# Patient Record
Sex: Female | Born: 1960 | Race: White | Hispanic: No | Marital: Married | Smoking: Former smoker
Health system: Southern US, Community
[De-identification: ages and names within clinical notes are randomized; demographics above are authoritative.]

## PROBLEM LIST (undated history)

## (undated) DIAGNOSIS — E119 Type 2 diabetes mellitus without complications: Secondary | ICD-10-CM

## (undated) DIAGNOSIS — K219 Gastro-esophageal reflux disease without esophagitis: Secondary | ICD-10-CM

## (undated) DIAGNOSIS — E7800 Pure hypercholesterolemia, unspecified: Secondary | ICD-10-CM

## (undated) DIAGNOSIS — D65 Disseminated intravascular coagulation [defibrination syndrome]: Secondary | ICD-10-CM

## (undated) DIAGNOSIS — J189 Pneumonia, unspecified organism: Secondary | ICD-10-CM

## (undated) DIAGNOSIS — M1990 Unspecified osteoarthritis, unspecified site: Secondary | ICD-10-CM

## (undated) DIAGNOSIS — Z9889 Other specified postprocedural states: Secondary | ICD-10-CM

## (undated) DIAGNOSIS — I10 Essential (primary) hypertension: Secondary | ICD-10-CM

## (undated) DIAGNOSIS — G43909 Migraine, unspecified, not intractable, without status migrainosus: Secondary | ICD-10-CM

## (undated) DIAGNOSIS — R112 Nausea with vomiting, unspecified: Secondary | ICD-10-CM

## (undated) DIAGNOSIS — B009 Herpesviral infection, unspecified: Secondary | ICD-10-CM

## (undated) HISTORY — DX: Migraine, unspecified, not intractable, without status migrainosus: G43.909

## (undated) HISTORY — PX: HIP ARTHROPLASTY: SHX981

## (undated) HISTORY — DX: Herpesviral infection, unspecified: B00.9

## (undated) HISTORY — PX: CERVICAL DISC SURGERY: SHX588

## (undated) HISTORY — PX: TOTAL ABDOMINAL HYSTERECTOMY: SHX209

## (undated) HISTORY — PX: ABDOMINAL HYSTERECTOMY: SHX81

---

## 1994-02-12 HISTORY — PX: DILATION AND CURETTAGE OF UTERUS: SHX78

## 1997-02-12 DIAGNOSIS — D65 Disseminated intravascular coagulation [defibrination syndrome]: Secondary | ICD-10-CM

## 1997-02-12 HISTORY — DX: Disseminated intravascular coagulation (defibrination syndrome): D65

## 1998-02-12 HISTORY — PX: HERNIA REPAIR: SHX51

## 2004-03-12 ENCOUNTER — Emergency Department: Payer: Self-pay | Admitting: Internal Medicine

## 2005-08-03 ENCOUNTER — Encounter: Admission: RE | Admit: 2005-08-03 | Discharge: 2005-08-03 | Payer: Self-pay | Admitting: General Surgery

## 2005-10-10 ENCOUNTER — Inpatient Hospital Stay (HOSPITAL_COMMUNITY): Admission: RE | Admit: 2005-10-10 | Discharge: 2005-10-14 | Payer: Self-pay | Admitting: General Surgery

## 2006-04-03 ENCOUNTER — Emergency Department: Payer: Self-pay | Admitting: Unknown Physician Specialty

## 2008-03-28 ENCOUNTER — Ambulatory Visit: Payer: Self-pay | Admitting: Internal Medicine

## 2008-04-03 ENCOUNTER — Ambulatory Visit: Payer: Self-pay | Admitting: Family Medicine

## 2008-06-28 ENCOUNTER — Ambulatory Visit (HOSPITAL_COMMUNITY): Admission: RE | Admit: 2008-06-28 | Discharge: 2008-06-29 | Payer: Self-pay | Admitting: Neurosurgery

## 2009-01-04 ENCOUNTER — Ambulatory Visit: Payer: Self-pay | Admitting: Internal Medicine

## 2009-11-11 ENCOUNTER — Ambulatory Visit: Payer: Self-pay | Admitting: Internal Medicine

## 2010-05-23 LAB — BASIC METABOLIC PANEL
BUN: 8 mg/dL (ref 6–23)
CO2: 29 mEq/L (ref 19–32)
Calcium: 9.6 mg/dL (ref 8.4–10.5)
Chloride: 101 mEq/L (ref 96–112)
Creatinine, Ser: 0.62 mg/dL (ref 0.4–1.2)
GFR calc Af Amer: >60 mL/min (ref >60)
GFR calc non Af Amer: >60 mL/min (ref >60)
Glucose, Bld: 106 mg/dL — ABNORMAL HIGH (ref 70–99)
Potassium: 3.8 mEq/L (ref 3.5–5.1)
Sodium: 138 mEq/L (ref 135–145)

## 2010-05-23 LAB — CBC
HCT: 41.8 % (ref 36.0–46.0)
Hemoglobin: 14.4 g/dL (ref 12.0–15.0)
MCHC: 34.5 g/dL (ref 30.0–36.0)
MCV: 88.2 fL (ref 78.0–100.0)
Platelets: 311 10*3/uL (ref 150–400)
RBC: 4.74 MIL/uL (ref 3.87–5.11)
RDW: 13.3 % (ref 11.5–15.5)
WBC: 8.5 10*3/uL (ref 4.0–10.5)

## 2010-06-14 ENCOUNTER — Other Ambulatory Visit: Payer: Self-pay | Admitting: Orthopedic Surgery

## 2010-06-14 DIAGNOSIS — M542 Cervicalgia: Secondary | ICD-10-CM

## 2010-06-14 DIAGNOSIS — M79605 Pain in left leg: Secondary | ICD-10-CM

## 2010-06-15 ENCOUNTER — Ambulatory Visit
Admission: RE | Admit: 2010-06-15 | Discharge: 2010-06-15 | Disposition: A | Discharge status: Home / Self Care | Payer: BC Managed Care – PPO | Source: Ambulatory Visit | Attending: Orthopedic Surgery | Admitting: Orthopedic Surgery

## 2010-06-15 DIAGNOSIS — M542 Cervicalgia: Secondary | ICD-10-CM

## 2010-06-15 DIAGNOSIS — M79605 Pain in left leg: Secondary | ICD-10-CM

## 2010-06-27 NOTE — Op Note (Signed)
NAME:  DACY, ENRICO NO.:  [id number]   MEDICAL RECORD NO.:  [id number]          PATIENT TYPE:  OIB   LOCATION:  3533                         FACILITY:  [facility]   PHYSICIAN:  Hewitt Shorts, M.D.DATE OF BIRTH:  08-Feb-1961   DATE OF PROCEDURE:  06/28/2008  DATE OF DISCHARGE:                               OPERATIVE REPORT   PREOPERATIVE DIAGNOSES:  1. Cervical spondylosis.  2. Cervical degenerative disk disease.  3. C6 cervical disk herniation.  4. Cervical radiculopathy.   POSTOPERATIVE DIAGNOSES:  1. Cervical spondylosis.  2. Cervical degenerative disk disease.  3. C6 cervical disk herniation.  4. Cervical radiculopathy.   PROCEDURE:  C4-5 and C5-6 anterior cervical decompression and  arthrodesis with allograft and tethered cervical plating.   SURGEON:  Hewitt Shorts, MD   ASSISTANT:  Stefani Dama, M.D.   ANESTHESIA:  General endotracheal.   INDICATIONS:  The patient is a 50 year old woman who presented with  right cervical radicular pain and headache.  She has advanced  spondylosis and degenerative disk disease at the C4-5 level, somewhat  left spondylosis and degenerative disk disease at the C5-6 level, but  with superimposed central through right C5-6 cervical disk herniation  with resultant radiculopathy.  The decision was made to proceed with  decompression and arthrodesis.   PROCEDURE:  The patient was brought to the operating room and placed  under general endotracheal anesthesia.  The patient was placed in 10  pounds of Holter traction.  Neck was prepped with Betadine soap and  solution and draped in sterile fashion.  A horizontal incision was made  in the left side of the neck.  The line of the incision was infiltrated  with local anesthetic with epinephrine.  Dissection was carried down  through the subcutaneous tissue.  Bipolar cautery and electrocautery was  used to maintain hemostasis.  Dissection was carried down to the  platysma and then through the avascular plane leaving the  sternocleidomastoid, carotid artery, and jugular vein laterally and the  trachea and esophagus medially.  The ventral aspect of the vertebral  column was identified and localizing x-rays were taken and the C4-5 and  C5-6 intervertebral disk space identified.  Diskectomy was begun at each  level with incision of the annulus and continued microcurettes and  pituitary rongeurs.  Anterior osteophytic over growth was removed using  the osteophyte removal tool and then the operating microscope was draped  and brought into the field to provide additional navigation,  illumination, and visualization.  The remainder of the decompression was  performed using microdissection and microsurgical technique.  Diskectomy  was continued using microcurettes and pituitary rongeurs.  The  cartilaginous endplates were removed using microcurettes along with the  X-Max drill and then posterior osteophytic overgrowth was removed using  the X-Max drill along with a 2-mm Kerrison punch with a thin footplate.  At each level, there was significant posterior osteophytic overgrowth.  This was removed at the C5-6 level.  As we opened the posterior  longitudinal ligament, there were fragments of disk that were removed,  and in the end, good decompression of the spinal  canal and thecal sac  was achieved at each level with removal of spondylotic overgrowth.  Dissection was then carried out laterally to the neuroforamen, and they  were like wise decompressed bilaterally.  Gelfoam with thrombin was used  to establish hemostasis.  Good hemostasis was established.  The wound  was irrigated extensively with bacitracin solution and then we measured  the heights of the intervertebral disk space and selected two 7-mm  allograft implants.  Each of them were hydrated in saline solutions and  positioned in the intervertebral disk space and countersunk.  We then  discontinued  the cervical traction and selected a 32-mm tethered  cervical plate.  It was positioned over the fusion construct and secured  to the vertebra with 4 x 13 mm variable angle screws placing a pair of  screws at C4 and another pair at C6 and straight angle screw at C5.  Each screw was drilled and screw was placed in alternating fashion.  Once all 5 screws were in place, a final tightening was performed.  An x-  ray was taken, which showed the grasp, plate and screws, all in good  position.  The alignment was good and then the wound was irrigated with  bacitracin solution and checked for hemostasis which was established and  confirmed, and then we proceeded with closure.  The platysma was closed  with interrupted, inverted 2-0 undyed Vicryl sutures.  The subcutaneous  and subcuticular layer were closed with interrupted inverted 3-0 undyed  Vicryl sutures.  The skin edges was closed with Dermabond.  The  procedure was tolerated well.  The estimated blood loss was 100 mL.  Sponge and needle count were correct.  Following surgery, the patient  was reversed from the anesthetic, extubated, and transferred to the  recovery room for further care where she was noted to be moving all 4  extremities to command.      Hewitt Shorts, M.D.  Electronically Signed     RWN/MEDQ  D:  06/28/2008  T:  06/29/2008  Job:  [id number]

## 2010-06-30 NOTE — Discharge Summary (Signed)
NAME:  [name], SOMAN NO.:  [id number]   MEDICAL RECORD NO.:  [id number]          PATIENT TYPE:  INP   LOCATION:  1407                         [facility]ACILITY[facility]   PHYSICIAN:  Ollen Gross. Vernell Morgans, M.D. DATE OF BIRTH:  Aug 13, 1960   DATE OF ADMISSION:  10/10/2005  DATE OF DISCHARGE:  10/14/2005                                 DISCHARGE SUMMARY   Ms. Tracy Bryan is a 50 year old white female with a ventral hernia from  previous abdominal surgeries.  She was brought to the operating room on  August 29 and underwent a ventral hernia repair with mesh and extensive  lysis of adhesions.  Postoperatively, she did very well.  A drain was left  in her subcutaneous tissue that was drainage of serosanguineous fluid.  She  did have a history of hypertension, and this was controlled with IV  Lopressor until her bowel function returned, and she was able to start back  on her regular oral antihypertensives.  Her bowel function gradually  improved over the days that she was here.  She did develop some right foot  pain while here, and a duplex ruled out a DVT, and by September 2 she was  tolerating a diet, ambulating without difficulty.  Her pain was under  control with p.o. pain meds, and she was ready for discharge home.  She was  given a prescription for Vicodin for pain and she wished to resume her home  meds.   ACTIVITIES:  No heavy lifting.  Diet is as tolerated.  She will follow with  Dr. Carolynne Edouard in about a week to hopefully get her drain out.   Condition is stable.   FINAL DIAGNOSIS:  Ventral hernia.  She is discharged home.      Ollen Gross. Vernell Morgans, M.D.  Electronically Signed     PST/MEDQ  D:  10/22/2005  T:  10/22/2005  Job:  [id number]

## 2010-06-30 NOTE — Op Note (Signed)
NAME:  Tracy Bryan, Tracy Bryan NO.:  [id number]   MEDICAL RECORD NO.:  [id number]          PATIENT TYPE:  INP   LOCATION:  1407                         FACILITY:  [facility]   PHYSICIAN:  Ollen Gross. Vernell Morgans, M.D. DATE OF BIRTH:  09-Apr-1960   DATE OF PROCEDURE:  10/10/2005  DATE OF DISCHARGE:                                 OPERATIVE REPORT   PREOPERATIVE DIAGNOSIS:  Ventral hernia.   POSTOPERATIVE DIAGNOSIS:  Ventral hernia.   PROCEDURES:  Exploratory laparotomy, extensive lysis of adhesions taking  greater than an hour and ventral hernia repair with mesh.   SURGEON:  Dr. Carolynne Edouard.   ASSISTANT:  Dr. Maryagnes Amos.   ANESTHESIA:  General endotracheal.   DESCRIPTION OF PROCEDURE:  After informed consent was obtained, the patient  was brought to the operating room and placed in supine position on the  operating room table.  After adequate induction of general anesthesia, the  patient's abdomen was prepped with Betadine and draped in the usual sterile  manner.  A midline incision was made with a 10 blade knife through her old  incision.  This incision was carried down through the skin and subcutaneous  tissue sharply with the electrocautery until her fascia over the abdominal  wall was encountered.  The patient had a lot of scar tissue from  approximately 12 previous abdominal surgeries. Care was taken going through  each layer of the abdominal wall so as not to injure anything. Once the  fascia had been opened, blunt hemostat dissection was used to separate any  adhesions from the fascia and the fascia was slowly able to be opened with  the electrocautery after doing this. The fascia was then grasped with Kocher  clamps on each side and elevated toward the ceiling. The adhesions of  omentum and bowel to the anterior abdominal wall were then taken down  circumferentially around the inside of the abdominal wall.  This was done  sharply with the Metzenbaum scissors and with the  electrocautery.  This was  very tedious dissection as the patient had almost complete adhesions of her  abdominal cavity and no free space.  This took greater than an hour to try  to finally free up enough of these adhesions from the abdominal wall that we  could fix the hernia. Once we had freed up enough space, her fascia was  identified.  A subcutaneous plane on top of the fascia was then opened  sharply with the electrocautery and dissected free circumferentially around  the wound. Next a piece of proceed mesh was chosen and the mesh was oriented  with the blue side towards the ceiling. The mesh was then sewed through the  abdominal wall circumferentially with interrupted #1 Novofil stitches.  Each  stitch was placed close enough that no abdominal contents could herniate up  under the mesh and these were brought through the abdominal wall laterally  so as to completely cover the hernia defect and the abdominal wall. Once  this was accomplished, the mesh was in good position. The abdominal cavity  was irrigated with copious amounts of saline. The fascia of the  anterior  abdominal wall was then closed with #1 running Novofil stitch as well as  several interrupted figure-of-eight #1 Novofil stitches.  The subcutaneous  tissue was then again irrigated with copious amounts of saline and Betadine.  A small stab incision was made in the right lower quadrant and a tonsil  clamp was placed through this opening into the wound.  A 19-French round  Harrison Mons drain was then brought through the opening and the drain was placed in  the subcutaneous bed. The drain was anchored to the skin with a 3-0 nylon  stitch. The  subcutaneous tissue was closed with running 2-0 Vicryl stitch and the skin  was closed with staples.  Sterile dressings were applied.  The patient  tolerated the procedure well. At the end of the case, all needle, sponge and  instrument counts were correct.  The patient was then awakened and  taken to  recovery in stable condition.      Ollen Gross. Vernell Morgans, M.D.  Electronically Signed     PST/MEDQ  D:  10/11/2005  T:  10/12/2005  Job:  [id number]

## 2011-01-08 ENCOUNTER — Ambulatory Visit: Payer: Self-pay | Admitting: Internal Medicine

## 2011-04-24 ENCOUNTER — Ambulatory Visit: Payer: Self-pay | Admitting: Internal Medicine

## 2011-04-30 ENCOUNTER — Ambulatory Visit: Payer: Self-pay

## 2012-02-03 ENCOUNTER — Emergency Department: Payer: Self-pay | Admitting: Emergency Medicine

## 2012-02-03 LAB — BASIC METABOLIC PANEL
BUN: 25 mg/dL — ABNORMAL HIGH (ref 7–18)
Co2: 25 mmol/L (ref 21–32)
Creatinine: 0.89 mg/dL (ref 0.60–1.30)
EGFR (African American): >60
EGFR (Non-African Amer.): >60
Glucose: 142 mg/dL — ABNORMAL HIGH (ref 65–99)
Potassium: 3.8 mmol/L (ref 3.5–5.1)
Sodium: 136 mmol/L (ref 136–145)

## 2012-02-03 LAB — URINALYSIS, COMPLETE
Bacteria: NONE SEEN
Bilirubin,UR: NEGATIVE
Blood: NEGATIVE
Ketone: NEGATIVE
Nitrite: NEGATIVE
Ph: 7 (ref 4.5–8.0)
Protein: NEGATIVE
Specific Gravity: 1.019 (ref 1.003–1.030)

## 2012-02-03 LAB — CBC
HCT: 42.6 % (ref 35.0–47.0)
MCV: 88 fL (ref 80–100)
Platelet: 259 10*3/uL (ref 150–440)
RDW: 12.9 % (ref 11.5–14.5)
WBC: 13.3 10*3/uL — ABNORMAL HIGH (ref 3.6–11.0)

## 2012-02-03 LAB — TROPONIN I: Troponin-I: <0.02 ng/mL

## 2012-02-27 ENCOUNTER — Ambulatory Visit: Payer: Self-pay | Admitting: Family Medicine

## 2013-01-16 ENCOUNTER — Emergency Department: Payer: Self-pay | Admitting: Emergency Medicine

## 2013-01-16 LAB — CBC
HGB: 12.6 g/dL (ref 12.0–16.0)
MCHC: 34 g/dL (ref 32.0–36.0)
Platelet: 320 10*3/uL (ref 150–440)
RBC: 4.35 10*6/uL (ref 3.80–5.20)
RDW: 13.2 % (ref 11.5–14.5)
WBC: 9.5 10*3/uL (ref 3.6–11.0)

## 2013-01-16 LAB — BASIC METABOLIC PANEL
Anion Gap: 9 (ref 7–16)
BUN: 21 mg/dL — ABNORMAL HIGH (ref 7–18)
Chloride: 102 mmol/L (ref 98–107)
EGFR (Non-African Amer.): >60
Osmolality: 277 (ref 275–301)
Potassium: 3.8 mmol/L (ref 3.5–5.1)
Sodium: 136 mmol/L (ref 136–145)

## 2013-01-16 LAB — CK TOTAL AND CKMB (NOT AT ARMC): CK-MB: 1.9 ng/mL (ref 0.5–3.6)

## 2013-06-12 ENCOUNTER — Ambulatory Visit: Payer: Self-pay | Admitting: Family Medicine

## 2013-06-12 LAB — URINALYSIS, COMPLETE
BILIRUBIN, UR: NEGATIVE
Blood: NEGATIVE
Glucose,UR: NEGATIVE mg/dL (ref 0–75)
KETONE: NEGATIVE
NITRITE: NEGATIVE
Ph: 7 (ref 4.5–8.0)
SPECIFIC GRAVITY: 1.01 (ref 1.003–1.030)

## 2013-06-12 LAB — BASIC METABOLIC PANEL
ANION GAP: 13 (ref 7–16)
BUN: 28 mg/dL — ABNORMAL HIGH (ref 7–18)
Calcium, Total: 9.8 mg/dL (ref 8.5–10.1)
Chloride: 96 mmol/L — ABNORMAL LOW (ref 98–107)
Co2: 27 mmol/L (ref 21–32)
Creatinine: 1.24 mg/dL (ref 0.60–1.30)
EGFR (Non-African Amer.): 49 — ABNORMAL LOW
GFR CALC AF AMER: 57 — AB
Glucose: 128 mg/dL — ABNORMAL HIGH (ref 65–99)
Osmolality: 279 (ref 275–301)
Potassium: 3.5 mmol/L (ref 3.5–5.1)
Sodium: 136 mmol/L (ref 136–145)

## 2013-06-12 LAB — CBC WITH DIFFERENTIAL/PLATELET
BASOS ABS: 0.2 10*3/uL — AB (ref 0.0–0.1)
Basophil %: 2.1 %
Eosinophil #: 0.1 10*3/uL (ref 0.0–0.7)
Eosinophil %: 1.3 %
HCT: 42.6 % (ref 35.0–47.0)
HGB: 14.1 g/dL (ref 12.0–16.0)
LYMPHS ABS: 2.4 10*3/uL (ref 1.0–3.6)
Lymphocyte %: 25.6 %
MCH: 28.9 pg (ref 26.0–34.0)
MCHC: 33.1 g/dL (ref 32.0–36.0)
MCV: 87 fL (ref 80–100)
Monocyte #: 0.5 x10 3/mm (ref 0.2–0.9)
Monocyte %: 5.5 %
NEUTROS PCT: 65.5 %
Neutrophil #: 6 10*3/uL (ref 1.4–6.5)
PLATELETS: 369 10*3/uL (ref 150–440)
RBC: 4.88 10*6/uL (ref 3.80–5.20)
RDW: 13.7 % (ref 11.5–14.5)
WBC: 9.2 10*3/uL (ref 3.6–11.0)

## 2013-06-23 ENCOUNTER — Ambulatory Visit: Payer: Self-pay | Admitting: Family Medicine

## 2013-06-23 LAB — URINALYSIS, COMPLETE
BILIRUBIN, UR: NEGATIVE
BLOOD: NEGATIVE
GLUCOSE, UR: NEGATIVE mg/dL (ref 0–75)
Ketone: NEGATIVE
NITRITE: NEGATIVE
Ph: 6 (ref 4.5–8.0)
Protein: NEGATIVE
Specific Gravity: 1.025 (ref 1.003–1.030)

## 2013-06-23 LAB — BASIC METABOLIC PANEL
Anion Gap: 11 (ref 7–16)
BUN: 19 mg/dL — ABNORMAL HIGH (ref 7–18)
Calcium, Total: 9.6 mg/dL (ref 8.5–10.1)
Chloride: 99 mmol/L (ref 98–107)
Co2: 28 mmol/L (ref 21–32)
Creatinine: 0.95 mg/dL (ref 0.60–1.30)
EGFR (African American): >60
Glucose: 97 mg/dL (ref 65–99)
OSMOLALITY: 278 (ref 275–301)
POTASSIUM: 3.8 mmol/L (ref 3.5–5.1)
SODIUM: 138 mmol/L (ref 136–145)

## 2013-06-23 LAB — GC/CHLAMYDIA PROBE AMP

## 2013-06-23 LAB — WET PREP, GENITAL

## 2013-06-24 LAB — URINE CULTURE

## 2013-07-08 ENCOUNTER — Observation Stay: Payer: Self-pay | Admitting: Internal Medicine

## 2013-07-08 LAB — CBC WITH DIFFERENTIAL/PLATELET
BASOS ABS: 0 10*3/uL (ref 0.0–0.1)
Basophil %: 0.6 %
EOS ABS: 0 10*3/uL (ref 0.0–0.7)
EOS PCT: 0.6 %
HCT: 35.1 % (ref 35.0–47.0)
HGB: 11.9 g/dL — ABNORMAL LOW (ref 12.0–16.0)
LYMPHS ABS: 2 10*3/uL (ref 1.0–3.6)
Lymphocyte %: 43.6 %
MCH: 30.4 pg (ref 26.0–34.0)
MCHC: 34 g/dL (ref 32.0–36.0)
MCV: 90 fL (ref 80–100)
Monocyte #: 0.3 x10 3/mm (ref 0.2–0.9)
Monocyte %: 6.8 %
NEUTROS ABS: 2.2 10*3/uL (ref 1.4–6.5)
Neutrophil %: 48.4 %
Platelet: 133 10*3/uL — ABNORMAL LOW (ref 150–440)
RBC: 3.92 10*6/uL (ref 3.80–5.20)
RDW: 12.5 % (ref 11.5–14.5)
WBC: 4.6 10*3/uL (ref 3.6–11.0)

## 2013-07-08 LAB — COMPREHENSIVE METABOLIC PANEL
ALBUMIN: 3.5 g/dL (ref 3.4–5.0)
ALK PHOS: 65 U/L
ALT: 16 U/L (ref 12–78)
ANION GAP: 8 (ref 7–16)
AST: 18 U/L (ref 15–37)
BILIRUBIN TOTAL: 0.2 mg/dL (ref 0.2–1.0)
BUN: 10 mg/dL (ref 7–18)
CREATININE: 0.92 mg/dL (ref 0.60–1.30)
Calcium, Total: 8.7 mg/dL (ref 8.5–10.1)
Chloride: 108 mmol/L — ABNORMAL HIGH (ref 98–107)
Co2: 23 mmol/L (ref 21–32)
EGFR (African American): >60
GLUCOSE: 99 mg/dL (ref 65–99)
Osmolality: 277 (ref 275–301)
Potassium: 3.5 mmol/L (ref 3.5–5.1)
Sodium: 139 mmol/L (ref 136–145)
TOTAL PROTEIN: 6.7 g/dL (ref 6.4–8.2)

## 2013-07-22 HISTORY — PX: COLONOSCOPY: SHX174

## 2013-07-22 LAB — HM COLONOSCOPY

## 2013-09-28 ENCOUNTER — Ambulatory Visit: Payer: Self-pay | Admitting: Physician Assistant

## 2013-09-28 LAB — CBC WITH DIFFERENTIAL/PLATELET
BASOS ABS: 0.1 10*3/uL (ref 0.0–0.1)
Basophil %: 1.3 %
Eosinophil #: 0.4 10*3/uL (ref 0.0–0.7)
Eosinophil %: 3.9 %
HCT: 39.4 % (ref 35.0–47.0)
HGB: 13.1 g/dL (ref 12.0–16.0)
LYMPHS ABS: 2.2 10*3/uL (ref 1.0–3.6)
LYMPHS PCT: 21.2 %
MCH: 28.4 pg (ref 26.0–34.0)
MCHC: 33.4 g/dL (ref 32.0–36.0)
MCV: 85 fL (ref 80–100)
Monocyte #: 0.8 x10 3/mm (ref 0.2–0.9)
Monocyte %: 7.3 %
NEUTROS ABS: 6.9 10*3/uL — AB (ref 1.4–6.5)
NEUTROS PCT: 66.3 %
Platelet: 290 10*3/uL (ref 150–440)
RBC: 4.62 10*6/uL (ref 3.80–5.20)
RDW: 12.9 % (ref 11.5–14.5)
WBC: 10.5 10*3/uL (ref 3.6–11.0)

## 2013-09-28 LAB — URINALYSIS, COMPLETE
BLOOD: NEGATIVE
Bilirubin,UR: NEGATIVE
GLUCOSE, UR: NEGATIVE
Ketone: NEGATIVE
Nitrite: NEGATIVE
Ph: 5 (ref 5.0–8.0)
Protein: NEGATIVE
Specific Gravity: 1.025 (ref 1.000–1.030)

## 2013-09-28 LAB — RAPID STREP-A WITH REFLX: Micro Text Report: NEGATIVE

## 2013-10-01 LAB — BETA STREP CULTURE(ARMC)

## 2014-04-15 ENCOUNTER — Other Ambulatory Visit: Payer: Self-pay | Admitting: Orthopaedic Surgery

## 2014-05-05 NOTE — Pre-Procedure Instructions (Signed)
Tracy FloodSharon S Bryan  05/05/2014   Your procedure is scheduled on: Tuesday, May 18, 2014 at 12:35 PM  [facility] at 10:30 AM.  Call this number if you have problems the morning of surgery: [phone]   Remember:   Do not eat food or drink liquids after midnight Monday, May 17, 2014   Take these medicines the morning of surgery with A SIP OF WATER: Duloxetine (Cymbalta), Metoprolol (Lopressor)   Do NOT take any diabetic medications the morning of your surgery   Please stop taking any herbal medications, Ibuprofen, Advil, Motrin, Alleve, etc Tuesday March 29th.   Do not wear jewelry, make-up or nail polish.  Do not wear lotions, powders, or perfumes. You may not wear deodorant.  Do not shave 48 hours prior to surgery.   Do not bring valuables to the hospital.  [facility] not [facility] for any belongings or valuables.               Contacts, dentures or bridgework may not be worn into surgery.  Leave suitcase in the car. After surgery it may be brought to your room.  For patients admitted to the hospital, discharge time is determined by your treatment team.               Patients discharged the day of surgery will not be allowed to drive home.  Name and phone number of your driver:   Special Instructions:  Shower using CHG soap the night before and the morning of your surgery   Please read over the following fact sheets that you were given: Pain Booklet, Coughing and Deep Breathing, Blood Transfusion Information, Total Joint Packet, MRSA Information and Surgical Site Infection Prevention

## 2014-05-06 ENCOUNTER — Encounter (HOSPITAL_COMMUNITY): Payer: Self-pay

## 2014-05-06 ENCOUNTER — Encounter (HOSPITAL_COMMUNITY)
Admission: RE | Admit: 2014-05-06 | Discharge: 2014-05-06 | Disposition: A | Discharge status: Home / Self Care | Payer: Medicare Other | Source: Ambulatory Visit | Attending: Orthopaedic Surgery | Admitting: Orthopaedic Surgery

## 2014-05-06 DIAGNOSIS — Z01810 Encounter for preprocedural cardiovascular examination: Secondary | ICD-10-CM | POA: Diagnosis not present

## 2014-05-06 DIAGNOSIS — I10 Essential (primary) hypertension: Secondary | ICD-10-CM | POA: Diagnosis not present

## 2014-05-06 DIAGNOSIS — Z01818 Encounter for other preprocedural examination: Secondary | ICD-10-CM | POA: Diagnosis not present

## 2014-05-06 DIAGNOSIS — Z01812 Encounter for preprocedural laboratory examination: Secondary | ICD-10-CM | POA: Insufficient documentation

## 2014-05-06 DIAGNOSIS — K219 Gastro-esophageal reflux disease without esophagitis: Secondary | ICD-10-CM | POA: Diagnosis not present

## 2014-05-06 DIAGNOSIS — E119 Type 2 diabetes mellitus without complications: Secondary | ICD-10-CM | POA: Diagnosis not present

## 2014-05-06 HISTORY — DX: Gastro-esophageal reflux disease without esophagitis: K21.9

## 2014-05-06 HISTORY — DX: Disseminated intravascular coagulation (defibrination syndrome): D65

## 2014-05-06 HISTORY — DX: Essential (primary) hypertension: I10

## 2014-05-06 HISTORY — DX: Unspecified osteoarthritis, unspecified site: M19.90

## 2014-05-06 HISTORY — DX: Type 2 diabetes mellitus without complications: E11.9

## 2014-05-06 HISTORY — DX: Pneumonia, unspecified organism: J18.9

## 2014-05-06 HISTORY — DX: Other specified postprocedural states: Z98.890

## 2014-05-06 HISTORY — DX: Other specified postprocedural states: R11.2

## 2014-05-06 HISTORY — DX: Pure hypercholesterolemia, unspecified: E78.00

## 2014-05-06 LAB — BASIC METABOLIC PANEL
Anion gap: 12 (ref 5–15)
BUN: 23 mg/dL (ref 6–23)
CO2: 27 mmol/L (ref 19–32)
CREATININE: 1.08 mg/dL (ref 0.50–1.10)
Calcium: 10 mg/dL (ref 8.4–10.5)
Chloride: 98 mmol/L (ref 96–112)
GFR, EST AFRICAN AMERICAN: 66 mL/min — AB (ref >90)
GFR, EST NON AFRICAN AMERICAN: 57 mL/min — AB (ref >90)
Glucose, Bld: 149 mg/dL — ABNORMAL HIGH (ref 70–99)
POTASSIUM: 3.3 mmol/L — AB (ref 3.5–5.1)
Sodium: 137 mmol/L (ref 135–145)

## 2014-05-06 LAB — CBC WITH DIFFERENTIAL/PLATELET
Basophils Absolute: 0.1 10*3/uL (ref 0.0–0.1)
Basophils Relative: 1 % (ref 0–1)
EOS ABS: 0.5 10*3/uL (ref 0.0–0.7)
Eosinophils Relative: 5 % (ref 0–5)
HCT: 42.9 % (ref 36.0–46.0)
HEMOGLOBIN: 14.3 g/dL (ref 12.0–15.0)
Lymphocytes Relative: 30 % (ref 12–46)
Lymphs Abs: 2.9 10*3/uL (ref 0.7–4.0)
MCH: 28.5 pg (ref 26.0–34.0)
MCHC: 33.3 g/dL (ref 30.0–36.0)
MCV: 85.6 fL (ref 78.0–100.0)
MONO ABS: 0.5 10*3/uL (ref 0.1–1.0)
MONOS PCT: 5 % (ref 3–12)
NEUTROS PCT: 59 % (ref 43–77)
Neutro Abs: 5.5 10*3/uL (ref 1.7–7.7)
Platelets: 343 10*3/uL (ref 150–400)
RBC: 5.01 MIL/uL (ref 3.87–5.11)
RDW: 13.3 % (ref 11.5–15.5)
WBC: 9.5 10*3/uL (ref 4.0–10.5)

## 2014-05-06 LAB — URINE MICROSCOPIC-ADD ON

## 2014-05-06 LAB — URINALYSIS, ROUTINE W REFLEX MICROSCOPIC
BILIRUBIN URINE: NEGATIVE
GLUCOSE, UA: NEGATIVE mg/dL
Hgb urine dipstick: NEGATIVE
KETONES UR: NEGATIVE mg/dL
Nitrite: NEGATIVE
PH: 7.5 (ref 5.0–8.0)
Protein, ur: NEGATIVE mg/dL
Specific Gravity, Urine: 1.022 (ref 1.005–1.030)
Urobilinogen, UA: 0.2 mg/dL (ref 0.0–1.0)

## 2014-05-06 LAB — TYPE AND SCREEN
ABO/RH(D): A NEG
Antibody Screen: NEGATIVE

## 2014-05-06 LAB — SURGICAL PCR SCREEN
MRSA, PCR: NEGATIVE
Staphylococcus aureus: NEGATIVE

## 2014-05-06 LAB — PROTIME-INR
INR: 1.02 (ref 0.00–1.49)
Prothrombin Time: 13.5 seconds (ref 11.6–15.2)

## 2014-05-06 LAB — ABO/RH: ABO/RH(D): A NEG

## 2014-05-06 LAB — APTT: APTT: 28 s (ref 24–37)

## 2014-05-06 NOTE — Progress Notes (Signed)
PCP is Vonita MossMark Crissman and Cardiologist is Arnoldo HookerBruce Kowalski. Patient informed Nurse that she had a stress test > 5 years ago but denied having a cardiac cath, or sleep study. Patient also informed Nurse that she has not seen Dr. Gwen PoundsKowalski since having stress test.

## 2014-05-07 NOTE — Progress Notes (Signed)
Anesthesia Chart Review:  Pt is 54 year old female scheduled for L total hip arthroplasty on 05/18/2014 with Dr. Jerl Santosalldorf.   PMH includes: HTN, DM, hypercholesterolemia, GERD, DIC (1999). Former smoker. BMI 25.   Preoperative labs reviewed.    Chest x-ray reviewed. No active cardiopulmonary disease.   EKG: NSR. Low voltage QRS. Cannot rule out Anterior infarct, age undetermined. Nonspecific ST abnormality. No significant change since last tracing per interpretation by Dr. Gala RomneyBensimhon.   If no changes, I anticipate pt can proceed with surgery as scheduled.   Rica Mastngela Adaisha Campise, FNP-BC [facility]Anesthesiology Phone: [phone] 05/07/2014 1:45 PM

## 2014-05-12 NOTE — H&P (Signed)
TOTAL HIP ADMISSION H&P  Patient is admitted for left total hip arthroplasty.  Subjective:  Chief Complaint: left hip pain  HPI: Tracy Bryan, 54 y.o. female, has a history of pain and functional disability in the left hip(s) due to arthritis and patient has failed non-surgical conservative treatments for greater than 12 weeks to include NSAID's and/or analgesics, corticosteriod injections and supervised PT with diminished ADL's post treatment.  Onset of symptoms was gradual starting 6 years ago with gradually worsening course since that time.The patient noted no past surgery on the left hip(s).  Patient currently rates pain in the left hip at 9 out of 10 with activity. Patient has night pain, worsening of pain with activity and weight bearing, trendelenberg gait, pain that interfers with activities of daily living and pain with passive range of motion. 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 This condition presents safety issues increasing the risk of falls. This patient has had no.  There is no current active infection.  There are no active problems to display for this patient.  Past Medical History  Diagnosis Date  . PONV (postoperative nausea and vomiting)   . Hypertension   . Pneumonia   . Diabetes mellitus without complication     Type 2  . GERD (gastroesophageal reflux disease)   . Arthritis   . Hypercholesteremia   . Disseminated intravascular coagulation 1999    After child birth    Past Surgical History  Procedure Laterality Date  . Hip arthroplasty Right   . Hernia repair      Umblical  . Cervical disc surgery      No prescriptions prior to admission   No Known Allergies  History  Substance Use Topics  . Smoking status: Former Games developermoker  . Smokeless tobacco: Not on file  . Alcohol Use: No    No family history on file.   Review of Systems  Constitutional: Negative.   HENT: Negative.   Eyes: Negative.   Respiratory: Negative.    Cardiovascular: Negative.   Gastrointestinal: Negative.   Genitourinary: Negative.   Skin: Negative.   Neurological: Negative.   Endo/Heme/Allergies: Negative.   Psychiatric/Behavioral: Negative.     Objective:  Physical Exam  HENT:  Head: Normocephalic.  Eyes: Pupils are equal, round, and reactive to light.  Neck: Normal range of motion.  Cardiovascular: Normal rate.   Respiratory: Effort normal.  GI: Soft.  Musculoskeletal:  Left hip exam pain with flexion and very reduced and painful with rotation.  Leg lengths appear to be equal.  Sensory motor function normal  Neurological: She is alert.  Skin: Skin is warm.  Psychiatric: She has a normal mood and affect.    Vital signs in last 24 hours:    Labs:   There is no height or weight on file to calculate BMI.   Imaging Review Plain radiographs demonstrate severe degenerative joint disease of the left hip(s). The bone quality appears to be good for age and reported activity level.  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 The treatment options including medical management, injection therapy, arthroscopy and arthroplasty were discussed at length. The risks and benefits of total hip arthroplasty were presented and reviewed. The risks due to aseptic loosening, infection, stiffness, dislocation/subluxation,  thromboembolic  complications and other imponderables were discussed.  The patient acknowledged the explanation, agreed to proceed with the plan and consent was signed. Patient is being admitted for inpatient treatment for surgery, pain control, PT, OT, prophylactic antibiotics, VTE prophylaxis, progressive ambulation and ADL's and discharge  planning.The patient is planning to be discharged home with home health services

## 2014-05-17 MED ORDER — LACTATED RINGERS IV SOLN
INTRAVENOUS | Status: DC
Start: 2014-05-17 — End: 2014-05-18

## 2014-05-17 MED ORDER — CHLORHEXIDINE GLUCONATE 4 % EX LIQD
60.0000 mL | Freq: Once | CUTANEOUS | Status: DC
Start: 2014-05-17 — End: 2014-05-18
  Filled 2014-05-17: qty 60

## 2014-05-17 MED ORDER — CEFAZOLIN SODIUM-DEXTROSE 2-3 GM-% IV SOLR
2.0000 g | INTRAVENOUS | Status: AC
  Administered 2014-05-18: 2 g via INTRAVENOUS
  Filled 2014-05-17: qty 50

## 2014-05-18 ENCOUNTER — Inpatient Hospital Stay (HOSPITAL_COMMUNITY): Payer: Medicare Other | Admitting: Anesthesiology

## 2014-05-18 ENCOUNTER — Encounter (HOSPITAL_COMMUNITY): Payer: Self-pay | Admitting: *Deleted

## 2014-05-18 ENCOUNTER — Inpatient Hospital Stay (HOSPITAL_COMMUNITY)
Admission: RE | Admit: 2014-05-18 | Discharge: 2014-05-20 | DRG: 470 | Disposition: A | Discharge status: Home / Self Care | Payer: Medicare Other | Source: Ambulatory Visit | Attending: Orthopaedic Surgery | Admitting: Orthopaedic Surgery

## 2014-05-18 ENCOUNTER — Inpatient Hospital Stay (HOSPITAL_COMMUNITY): Payer: Medicare Other

## 2014-05-18 ENCOUNTER — Inpatient Hospital Stay (HOSPITAL_COMMUNITY): Payer: Medicare Other | Admitting: Emergency Medicine

## 2014-05-18 ENCOUNTER — Encounter (HOSPITAL_COMMUNITY)
Admission: RE | Disposition: A | Discharge status: Home / Self Care | Payer: Self-pay | Source: Ambulatory Visit | Attending: Orthopaedic Surgery

## 2014-05-18 DIAGNOSIS — Z87891 Personal history of nicotine dependence: Secondary | ICD-10-CM

## 2014-05-18 DIAGNOSIS — Z96641 Presence of right artificial hip joint: Secondary | ICD-10-CM | POA: Diagnosis present

## 2014-05-18 DIAGNOSIS — E118 Type 2 diabetes mellitus with unspecified complications: Secondary | ICD-10-CM

## 2014-05-18 DIAGNOSIS — E119 Type 2 diabetes mellitus without complications: Secondary | ICD-10-CM | POA: Diagnosis present

## 2014-05-18 DIAGNOSIS — E780 Pure hypercholesterolemia: Secondary | ICD-10-CM | POA: Diagnosis present

## 2014-05-18 DIAGNOSIS — Z862 Personal history of diseases of the blood and blood-forming organs and certain disorders involving the immune mechanism: Secondary | ICD-10-CM | POA: Diagnosis not present

## 2014-05-18 DIAGNOSIS — Z8701 Personal history of pneumonia (recurrent): Secondary | ICD-10-CM

## 2014-05-18 DIAGNOSIS — E1129 Type 2 diabetes mellitus with other diabetic kidney complication: Secondary | ICD-10-CM

## 2014-05-18 DIAGNOSIS — M1612 Unilateral primary osteoarthritis, left hip: Principal | ICD-10-CM | POA: Diagnosis present

## 2014-05-18 DIAGNOSIS — K219 Gastro-esophageal reflux disease without esophagitis: Secondary | ICD-10-CM | POA: Diagnosis present

## 2014-05-18 DIAGNOSIS — I10 Essential (primary) hypertension: Secondary | ICD-10-CM | POA: Diagnosis present

## 2014-05-18 DIAGNOSIS — M25552 Pain in left hip: Secondary | ICD-10-CM | POA: Diagnosis present

## 2014-05-18 DIAGNOSIS — Z419 Encounter for procedure for purposes other than remedying health state, unspecified: Secondary | ICD-10-CM

## 2014-05-18 DIAGNOSIS — E1165 Type 2 diabetes mellitus with hyperglycemia: Secondary | ICD-10-CM

## 2014-05-18 HISTORY — PX: TOTAL HIP ARTHROPLASTY: SHX124

## 2014-05-18 LAB — GLUCOSE, CAPILLARY
GLUCOSE-CAPILLARY: 131 mg/dL — AB (ref 70–99)
GLUCOSE-CAPILLARY: 99 mg/dL (ref 70–99)
GLUCOSE-CAPILLARY: 166 mg/dL — AB (ref 70–99)
Glucose-Capillary: 88 mg/dL (ref 70–99)

## 2014-05-18 SURGERY — ARTHROPLASTY, HIP, TOTAL, ANTERIOR APPROACH
Anesthesia: Spinal | Site: Hip | Laterality: Left

## 2014-05-18 MED ORDER — CEFAZOLIN SODIUM-DEXTROSE 2-3 GM-% IV SOLR
2.0000 g | Freq: Four times a day (QID) | INTRAVENOUS | Status: AC
  Administered 2014-05-18 – 2014-05-19 (×2): 2 g via INTRAVENOUS
  Filled 2014-05-18 (×2): qty 50

## 2014-05-18 MED ORDER — FENTANYL CITRATE 0.05 MG/ML IJ SOLN
INTRAMUSCULAR | Status: DC | PRN
  Administered 2014-05-18 (×2): 50 ug via INTRAVENOUS

## 2014-05-18 MED ORDER — ONDANSETRON HCL 4 MG/2ML IJ SOLN: 4.0000 mg | Freq: Four times a day (QID) | INTRAMUSCULAR | Status: DC | PRN

## 2014-05-18 MED ORDER — LACTATED RINGERS IV SOLN
INTRAVENOUS | Status: DC
  Administered 2014-05-18: 22:00:00 via INTRAVENOUS

## 2014-05-18 MED ORDER — METHOCARBAMOL 500 MG PO TABS
500.0000 mg | ORAL_TABLET | Freq: Four times a day (QID) | ORAL | Status: DC | PRN
  Administered 2014-05-18 – 2014-05-19 (×2): 500 mg via ORAL
  Filled 2014-05-18 (×3): qty 1

## 2014-05-18 MED ORDER — HYDROCODONE-ACETAMINOPHEN 5-325 MG PO TABS
1.0000 | ORAL_TABLET | ORAL | Status: DC | PRN
  Administered 2014-05-18 – 2014-05-20 (×7): 2 via ORAL
  Filled 2014-05-18 (×8): qty 2

## 2014-05-18 MED ORDER — METFORMIN HCL 500 MG PO TABS
500.0000 mg | ORAL_TABLET | Freq: Two times a day (BID) | ORAL | Status: DC
  Administered 2014-05-19 – 2014-05-20 (×2): 500 mg via ORAL
  Filled 2014-05-18 (×3): qty 1

## 2014-05-18 MED ORDER — CHLORTHALIDONE 25 MG PO TABS
25.0000 mg | ORAL_TABLET | Freq: Every day | ORAL | Status: DC
  Administered 2014-05-19: 25 mg via ORAL
  Filled 2014-05-18 (×3): qty 1

## 2014-05-18 MED ORDER — PROPOFOL INFUSION 10 MG/ML OPTIME
INTRAVENOUS | Status: DC | PRN
  Administered 2014-05-18: 100 ug/kg/min via INTRAVENOUS

## 2014-05-18 MED ORDER — METOPROLOL TARTRATE 50 MG PO TABS
50.0000 mg | ORAL_TABLET | Freq: Every day | ORAL | Status: DC
  Administered 2014-05-19: 50 mg via ORAL
  Filled 2014-05-18 (×2): qty 1

## 2014-05-18 MED ORDER — MIDAZOLAM HCL 2 MG/2ML IJ SOLN
INTRAMUSCULAR | Status: AC
  Filled 2014-05-18: qty 2

## 2014-05-18 MED ORDER — ASPIRIN EC 325 MG PO TBEC
325.0000 mg | DELAYED_RELEASE_TABLET | Freq: Two times a day (BID) | ORAL | Status: DC
  Administered 2014-05-19 – 2014-05-20 (×3): 325 mg via ORAL
  Filled 2014-05-18 (×3): qty 1

## 2014-05-18 MED ORDER — PHENYLEPHRINE 40 MCG/ML (10ML) SYRINGE FOR IV PUSH (FOR BLOOD PRESSURE SUPPORT)
PREFILLED_SYRINGE | INTRAVENOUS | Status: AC
  Filled 2014-05-18: qty 10

## 2014-05-18 MED ORDER — LIDOCAINE HCL (CARDIAC) 20 MG/ML IV SOLN
INTRAVENOUS | Status: DC | PRN
  Administered 2014-05-18: 30 mg via INTRAVENOUS

## 2014-05-18 MED ORDER — HYDROMORPHONE HCL 1 MG/ML IJ SOLN
0.5000 mg | INTRAMUSCULAR | Status: DC | PRN
  Administered 2014-05-18 – 2014-05-19 (×4): 1 mg via INTRAVENOUS
  Filled 2014-05-18 (×4): qty 1

## 2014-05-18 MED ORDER — METHOCARBAMOL 1000 MG/10ML IJ SOLN
500.0000 mg | INTRAVENOUS | Status: AC
  Administered 2014-05-18: 500 mg via INTRAVENOUS
  Filled 2014-05-18: qty 5

## 2014-05-18 MED ORDER — PHENYLEPHRINE HCL 10 MG/ML IJ SOLN
INTRAMUSCULAR | Status: DC | PRN
  Administered 2014-05-18 (×3): 80 ug via INTRAVENOUS
  Administered 2014-05-18 (×2): 40 ug via INTRAVENOUS
  Administered 2014-05-18 (×2): 80 ug via INTRAVENOUS
  Administered 2014-05-18: 40 ug via INTRAVENOUS
  Administered 2014-05-18: 80 ug via INTRAVENOUS

## 2014-05-18 MED ORDER — ALUM & MAG HYDROXIDE-SIMETH 200-200-20 MG/5ML PO SUSP: 30.0000 mL | ORAL | Status: DC | PRN

## 2014-05-18 MED ORDER — ONDANSETRON HCL 4 MG PO TABS: 4.0000 mg | ORAL_TABLET | Freq: Four times a day (QID) | ORAL | Status: DC | PRN

## 2014-05-18 MED ORDER — INSULIN ASPART 100 UNIT/ML ~~LOC~~ SOLN
0.0000 [IU] | Freq: Three times a day (TID) | SUBCUTANEOUS | Status: DC
  Administered 2014-05-19 (×2): 3 [IU] via SUBCUTANEOUS

## 2014-05-18 MED ORDER — DOCUSATE SODIUM 100 MG PO CAPS
100.0000 mg | ORAL_CAPSULE | Freq: Two times a day (BID) | ORAL | Status: DC
  Administered 2014-05-18 – 2014-05-20 (×4): 100 mg via ORAL
  Filled 2014-05-18 (×4): qty 1

## 2014-05-18 MED ORDER — ONDANSETRON HCL 4 MG/2ML IJ SOLN
INTRAMUSCULAR | Status: AC
  Filled 2014-05-18: qty 2

## 2014-05-18 MED ORDER — MIDAZOLAM HCL 5 MG/5ML IJ SOLN
INTRAMUSCULAR | Status: DC | PRN
  Administered 2014-05-18: 2 mg via INTRAVENOUS

## 2014-05-18 MED ORDER — FENTANYL CITRATE 0.05 MG/ML IJ SOLN
INTRAMUSCULAR | Status: AC
  Filled 2014-05-18: qty 5

## 2014-05-18 MED ORDER — LACTATED RINGERS IV SOLN
INTRAVENOUS | Status: DC
  Administered 2014-05-18 (×3): via INTRAVENOUS

## 2014-05-18 MED ORDER — ACETAMINOPHEN 325 MG PO TABS: 650.0000 mg | ORAL_TABLET | Freq: Four times a day (QID) | ORAL | Status: DC | PRN

## 2014-05-18 MED ORDER — HYDROMORPHONE HCL 1 MG/ML IJ SOLN
INTRAMUSCULAR | Status: AC
  Filled 2014-05-18: qty 1

## 2014-05-18 MED ORDER — DIPHENHYDRAMINE HCL 12.5 MG/5ML PO ELIX: 12.5000 mg | ORAL_SOLUTION | ORAL | Status: DC | PRN

## 2014-05-18 MED ORDER — PROMETHAZINE HCL 25 MG/ML IJ SOLN: 6.2500 mg | INTRAMUSCULAR | Status: DC | PRN

## 2014-05-18 MED ORDER — BUPIVACAINE HCL (PF) 0.75 % IJ SOLN
INTRAMUSCULAR | Status: DC | PRN
  Administered 2014-05-18: 1.8 mL via INTRATHECAL

## 2014-05-18 MED ORDER — METOCLOPRAMIDE HCL 5 MG/ML IJ SOLN
5.0000 mg | Freq: Three times a day (TID) | INTRAMUSCULAR | Status: DC | PRN
  Administered 2014-05-19: 10 mg via INTRAVENOUS
  Filled 2014-05-18: qty 2

## 2014-05-18 MED ORDER — PROPOFOL 10 MG/ML IV BOLUS
INTRAVENOUS | Status: AC
  Filled 2014-05-18: qty 20

## 2014-05-18 MED ORDER — SIMVASTATIN 40 MG PO TABS
40.0000 mg | ORAL_TABLET | Freq: Every day | ORAL | Status: DC
  Administered 2014-05-18 – 2014-05-19 (×2): 40 mg via ORAL
  Filled 2014-05-18 (×2): qty 1

## 2014-05-18 MED ORDER — ONDANSETRON HCL 4 MG/2ML IJ SOLN
INTRAMUSCULAR | Status: DC | PRN
  Administered 2014-05-18: 4 mg via INTRAVENOUS

## 2014-05-18 MED ORDER — PHENOL 1.4 % MT LIQD: 1.0000 | OROMUCOSAL | Status: DC | PRN

## 2014-05-18 MED ORDER — FENOFIBRATE 160 MG PO TABS
160.0000 mg | ORAL_TABLET | Freq: Every day | ORAL | Status: DC
  Administered 2014-05-19 – 2014-05-20 (×2): 160 mg via ORAL
  Filled 2014-05-18 (×2): qty 1

## 2014-05-18 MED ORDER — METOCLOPRAMIDE HCL 5 MG PO TABS: 5.0000 mg | ORAL_TABLET | Freq: Three times a day (TID) | ORAL | Status: DC | PRN

## 2014-05-18 MED ORDER — LIDOCAINE HCL (CARDIAC) 20 MG/ML IV SOLN
INTRAVENOUS | Status: AC
  Filled 2014-05-18: qty 5

## 2014-05-18 MED ORDER — HYDROMORPHONE HCL 1 MG/ML IJ SOLN
0.2500 mg | INTRAMUSCULAR | Status: DC | PRN
  Administered 2014-05-18 (×2): 0.5 mg via INTRAVENOUS

## 2014-05-18 MED ORDER — 0.9 % SODIUM CHLORIDE (POUR BTL) OPTIME
TOPICAL | Status: DC | PRN
  Administered 2014-05-18: 1000 mL

## 2014-05-18 MED ORDER — ACETAMINOPHEN 650 MG RE SUPP: 650.0000 mg | Freq: Four times a day (QID) | RECTAL | Status: DC | PRN

## 2014-05-18 MED ORDER — DEXTROSE 5 % IV SOLN: 500.0000 mg | Freq: Four times a day (QID) | INTRAVENOUS | Status: DC | PRN

## 2014-05-18 MED ORDER — SODIUM CHLORIDE 0.9 % IV SOLN
2000.0000 mg | INTRAVENOUS | Status: AC
  Administered 2014-05-18: 2000 mg via TOPICAL
  Filled 2014-05-18: qty 20

## 2014-05-18 MED ORDER — MENTHOL 3 MG MT LOZG: 1.0000 | LOZENGE | OROMUCOSAL | Status: DC | PRN

## 2014-05-18 MED ORDER — DULOXETINE HCL 60 MG PO CPEP
60.0000 mg | ORAL_CAPSULE | Freq: Two times a day (BID) | ORAL | Status: DC
  Administered 2014-05-18 – 2014-05-20 (×4): 60 mg via ORAL
  Filled 2014-05-18 (×4): qty 1

## 2014-05-18 MED ORDER — BISACODYL 5 MG PO TBEC: 5.0000 mg | DELAYED_RELEASE_TABLET | Freq: Every day | ORAL | Status: DC | PRN

## 2014-05-18 SURGICAL SUPPLY — 54 items
BLADE SAW SGTL 18X1.27X75 (BLADE) ×2 IMPLANT
BLADE SAW SGTL 18X1.27X75MM (BLADE) ×1
BLADE SURG ROTATE 9660 (MISCELLANEOUS) IMPLANT
CAPT HIP TOTAL 2 ×3 IMPLANT
CELLS DAT CNTRL 66122 CELL SVR (MISCELLANEOUS) ×1 IMPLANT
COVER PERINEAL POST (MISCELLANEOUS) ×3 IMPLANT
COVER SURGICAL LIGHT HANDLE (MISCELLANEOUS) ×3 IMPLANT
DRAPE C-ARM 42X72 X-RAY (DRAPES) ×3 IMPLANT
DRAPE IMP U-DRAPE 54X76 (DRAPES) ×3 IMPLANT
DRAPE STERI IOBAN 125X83 (DRAPES) ×3 IMPLANT
DRAPE U-SHAPE 47X51 STRL (DRAPES) ×9 IMPLANT
DRSG AQUACEL AG ADV 3.5X10 (GAUZE/BANDAGES/DRESSINGS) ×3 IMPLANT
DURAPREP 26ML APPLICATOR (WOUND CARE) ×3 IMPLANT
ELECT BLADE 4.0 EZ CLEAN MEGAD (MISCELLANEOUS)
ELECT CAUTERY BLADE 6.4 (BLADE) ×3 IMPLANT
ELECT REM PT RETURN 9FT ADLT (ELECTROSURGICAL) ×3
ELECTRODE BLDE 4.0 EZ CLN MEGD (MISCELLANEOUS) IMPLANT
ELECTRODE REM PT RTRN 9FT ADLT (ELECTROSURGICAL) ×1 IMPLANT
FACESHIELD WRAPAROUND (MASK) ×6 IMPLANT
GLOVE BIO SURGEON STRL SZ8 (GLOVE) ×21 IMPLANT
GLOVE BIOGEL PI IND STRL 6.5 (GLOVE) ×1 IMPLANT
GLOVE BIOGEL PI IND STRL 8 (GLOVE) ×2 IMPLANT
GLOVE BIOGEL PI IND STRL 8.5 (GLOVE) ×1 IMPLANT
GLOVE BIOGEL PI INDICATOR 6.5 (GLOVE) ×2
GLOVE BIOGEL PI INDICATOR 8 (GLOVE) ×4
GLOVE BIOGEL PI INDICATOR 8.5 (GLOVE) ×2
GLOVE SURG SS PI 6.0 STRL IVOR (GLOVE) ×6 IMPLANT
GOWN STRL REUS W/ TWL LRG LVL3 (GOWN DISPOSABLE) ×1 IMPLANT
GOWN STRL REUS W/ TWL XL LVL3 (GOWN DISPOSABLE) ×2 IMPLANT
GOWN STRL REUS W/TWL 2XL LVL3 (GOWN DISPOSABLE) ×3 IMPLANT
GOWN STRL REUS W/TWL LRG LVL3 (GOWN DISPOSABLE) ×3
GOWN STRL REUS W/TWL XL LVL3 (GOWN DISPOSABLE) ×6
KIT BASIN OR (CUSTOM PROCEDURE TRAY) ×3 IMPLANT
KIT ROOM TURNOVER OR (KITS) ×3 IMPLANT
LINER BOOT UNIVERSAL DISP (MISCELLANEOUS) IMPLANT
MANIFOLD NEPTUNE II (INSTRUMENTS) ×3 IMPLANT
NS IRRIG 1000ML POUR BTL (IV SOLUTION) ×3 IMPLANT
PACK TOTAL JOINT (CUSTOM PROCEDURE TRAY) ×3 IMPLANT
PACK UNIVERSAL I (CUSTOM PROCEDURE TRAY) ×3 IMPLANT
PAD ARMBOARD 7.5X6 YLW CONV (MISCELLANEOUS) ×6 IMPLANT
RTRCTR WOUND ALEXIS 18CM MED (MISCELLANEOUS) ×3
STAPLER VISISTAT 35W (STAPLE) ×3 IMPLANT
SUT ETHIBOND NAB CT1 #1 30IN (SUTURE) ×6 IMPLANT
SUT VIC AB 0 CT1 27 (SUTURE)
SUT VIC AB 0 CT1 27XBRD ANBCTR (SUTURE) IMPLANT
SUT VIC AB 1 CT1 27 (SUTURE) ×3
SUT VIC AB 1 CT1 27XBRD ANBCTR (SUTURE) ×1 IMPLANT
SUT VIC AB 2-0 CT1 27 (SUTURE) ×3
SUT VIC AB 2-0 CT1 TAPERPNT 27 (SUTURE) ×1 IMPLANT
SUT VLOC 180 0 24IN GS25 (SUTURE) ×3 IMPLANT
TOWEL OR 17X24 6PK STRL BLUE (TOWEL DISPOSABLE) ×3 IMPLANT
TOWEL OR 17X26 10 PK STRL BLUE (TOWEL DISPOSABLE) ×6 IMPLANT
TRAY FOLEY CATH 14FR (SET/KITS/TRAYS/PACK) IMPLANT
WATER STERILE IRR 1000ML POUR (IV SOLUTION) IMPLANT

## 2014-05-18 NOTE — Anesthesia Preprocedure Evaluation (Signed)
Anesthesia Evaluation  Patient identified by MRN, date of birth, ID band Patient awake    Reviewed: Allergy & Precautions, NPO status , Patient's Chart, lab work & pertinent test results  Airway Mallampati: II  TM Distance: >3 FB Neck ROM: Full    Dental no notable dental hx.    Pulmonary neg pulmonary ROS, former smoker,  breath sounds clear to auscultation  Pulmonary exam normal       Cardiovascular hypertension, Pt. on medications Rhythm:Regular Rate:Normal     Neuro/Psych negative neurological ROS  negative psych ROS   GI/Hepatic negative GI ROS, Neg liver ROS,   Endo/Other  diabetes, Type 2  Renal/GU negative Renal ROS  negative genitourinary   Musculoskeletal negative musculoskeletal ROS (+)   Abdominal   Peds negative pediatric ROS (+)  Hematology negative hematology ROS (+)   Anesthesia Other Findings   Reproductive/Obstetrics negative OB ROS                             Anesthesia Physical Anesthesia Plan  ASA: II  Anesthesia Plan: Spinal   Post-op Pain Management:    Induction: Intravenous  Airway Management Planned: Simple Face Mask  Additional Equipment:   Intra-op Plan:   Post-operative Plan:   Informed Consent: I have reviewed the patients History and Physical, chart, labs and discussed the procedure including the risks, benefits and alternatives for the proposed anesthesia with the patient or authorized representative who has indicated his/her understanding and acceptance.   Dental advisory given  Plan Discussed with: CRNA and Surgeon  Anesthesia Plan Comments:         Anesthesia Quick Evaluation

## 2014-05-18 NOTE — Transfer of Care (Signed)
Immediate Anesthesia Transfer of Care Note  Patient: Tracy Bryan  Procedure(s) Performed: Procedure(s): LEFT TOTAL HIP ARTHROPLASTY ANTERIOR APPROACH (Left)  Patient Location: PACU  Anesthesia Type:Spinal  Level of Consciousness: awake  Airway & Oxygen Therapy: Patient Spontanous Breathing  Post-op Assessment: Report given to RN and Post -op Vital signs reviewed and stable  Post vital signs: stable  Last Vitals:  Filed Vitals:   05/18/14 1058  BP: 134/77  Pulse: 67  Temp: 36.8 C  Resp: 16    Complications: No apparent anesthesia complications

## 2014-05-18 NOTE — Interval H&P Note (Signed)
OK for surgery PD 

## 2014-05-18 NOTE — Anesthesia Procedure Notes (Signed)
Spinal Patient location during procedure: OR Staffing Performed by: anesthesiologist  Preanesthetic Checklist Completed: patient identified, site marked, surgical consent, pre-op evaluation, timeout performed, IV checked, risks and benefits discussed and monitors and equipment checked Spinal Block Patient position: sitting Prep: Betadine Patient monitoring: heart rate, continuous pulse ox and blood pressure Injection technique: single-shot Needle Needle type: Sprotte  Needle gauge: 24 G Needle length: 9 cm Additional Notes Expiration date of kit checked and confirmed. Patient tolerated procedure well, without complications.

## 2014-05-18 NOTE — Op Note (Signed)
PRE-OP DIAGNOSIS:  LEFT HIP DEGENERATIVE JOINT DISEASE POST-OP DIAGNOSIS: same PROCEDURE:  LEFT TOTAL HIP ARTHROPLASTY ANTERIOR APPROACH ANESTHESIA:  Spinal and MAC SURGEON:  Marcene CorningPeter Deborra Phegley MD ASSISTANT:  Elodia FlorenceAndrew Nida PA-C   INDICATIONS FOR PROCEDURE:  The patient is a 54 y.o. female with a long history of a painful hip.  This has persisted despite multiple conservative measures.  The patient has persisted with pain and dysfunction making rest and activity difficult.  A total hip replacement is offered as surgical treatment.  Informed operative consent was obtained after discussion of possible complications including reaction to anesthesia, infection, neurovascular injury, dislocation, DVT, PE, and death.  The importance of the postoperative rehab program to optimize result was stressed with the patient.  SUMMARY OF FINDINGS AND PROCEDURE:  Under general anesthesia through a anterior approach an the Hana table a left THR was performed.  The patient had severe degenerative change and excellent bone quality.  We used DePuy components to replace the hip and these were size KA 9 Corail femur capped with a +1 32mm ceramic hip ball.  On the acetabular side we used a size 48 Gription shell with a plus 4 neutral polyethylene liner.  We did use a hole eliminator.  Elodia FlorenceAndrew Nida PA-C assisted throughout and was invaluable to the completion of the case in that he helped position and retract while I performed the procedure.  He also closed simultaneously to help minimize OR time.  I used fluoroscopy throughout the case to check position of components and leg lengths and read all these views myself.  DESCRIPTION OF PROCEDURE:  The patient was taken to the OR suite where general anesthetic was applied.  The patient was then positioned on the Hana table supine.  All bony prominences were appropriately padded.  Prep and drape was then performed in normal sterile fashion.  The patient was given kefzol preoperative  antibiotic and an appropriate time out was performed.  We then took an anterior approach to the left hip.  Dissection was taken through adipose to the tensor fascia lata fascia.  This structure was incised longitudinally and we dissected in the intermuscular interval just medial to this muscle.  Cobra retractors were placed superior and inferior to the femoral neck superficial to the capsule.  A capsular incision was then made and the retractors were placed along the femoral neck.  Xray was brought in to get a good level for the femoral neck cut which was made with an oscillating saw and osteotome.  The femoral head was removed with a corkscrew.  The acetabulum was exposed and some labral tissues were excised. Reaming was taken to the inside wall of the pelvis and sequentially up to 1 mm smaller than the actual component.  A trial of components was done and then the aforementioned acetabular shell was placed in appropriate tilt and anteversion confirmed by fluoroscopy. The liner was placed along with the hole eliminator and attention was turned to the femur.  The leg was brought down and over into adduction and the elevator bar was used to raise the femur up gently in the wound.  The piriformis was released with care taken to preserve the obturator internus attachment and all of the posterior capsule. The femur was reamed and then broached to the appropriate size.  A trial reduction was done and the aforementioned head and neck assembly gave us the best stability in extension with external rotation.  Leg lengths were felt to be about equal by fluoroscopic exam.  The trial components were removed and the wound irrigated.  We then placed the femoral component in appropriate anteversion.  The head was applied to a dry stem neck and the hip again reduced.  It was again stable in the aforementioned position.  The would was irrigated again followed by re-approximation of anterior capsule with ethibond suture. Tensor  fascia was repaired with V-loc suture  followed by subcutaneous closure with #O and #2 undyed vicryl.  Skin was closed with subQ stitch followed by steristrips and a sterile dressing.  EBL and IOF can be obtained from anesthesia records.  DISPOSITION:  The patient was extubated in the OR and taken to PACU in stable condition to be admitted to the Orthopedic Surgery for appropriate post-op care to include perioperative antibiotics and DVT prophylaxis.

## 2014-05-19 ENCOUNTER — Encounter (HOSPITAL_COMMUNITY): Payer: Self-pay | Admitting: Orthopaedic Surgery

## 2014-05-19 LAB — GLUCOSE, CAPILLARY
GLUCOSE-CAPILLARY: 127 mg/dL — AB (ref 70–99)
GLUCOSE-CAPILLARY: 161 mg/dL — AB (ref 70–99)
GLUCOSE-CAPILLARY: 154 mg/dL — AB (ref 70–99)
Glucose-Capillary: 178 mg/dL — ABNORMAL HIGH (ref 70–99)

## 2014-05-19 LAB — BASIC METABOLIC PANEL
Anion gap: 10 (ref 5–15)
BUN: 12 mg/dL (ref 6–23)
CO2: 28 mmol/L (ref 19–32)
CREATININE: 0.93 mg/dL (ref 0.50–1.10)
Calcium: 8.3 mg/dL — ABNORMAL LOW (ref 8.4–10.5)
Chloride: 96 mmol/L (ref 96–112)
GFR, EST AFRICAN AMERICAN: 79 mL/min — AB (ref >90)
GFR, EST NON AFRICAN AMERICAN: 68 mL/min — AB (ref >90)
Glucose, Bld: 217 mg/dL — ABNORMAL HIGH (ref 70–99)
POTASSIUM: 3.3 mmol/L — AB (ref 3.5–5.1)
SODIUM: 134 mmol/L — AB (ref 135–145)

## 2014-05-19 LAB — URINE CULTURE
COLONY COUNT: NO GROWTH
Culture: NO GROWTH

## 2014-05-19 LAB — CBC
HCT: 30.4 % — ABNORMAL LOW (ref 36.0–46.0)
HEMOGLOBIN: 10 g/dL — AB (ref 12.0–15.0)
MCH: 27.9 pg (ref 26.0–34.0)
MCHC: 32.9 g/dL (ref 30.0–36.0)
MCV: 84.9 fL (ref 78.0–100.0)
Platelets: 242 10*3/uL (ref 150–400)
RBC: 3.58 MIL/uL — AB (ref 3.87–5.11)
RDW: 13.2 % (ref 11.5–15.5)
WBC: 8 10*3/uL (ref 4.0–10.5)

## 2014-05-19 NOTE — Evaluation (Signed)
Occupational Therapy Evaluation/ discharge     Patient Details Name: Tracy Bryan MRN: [id number] DOB: 1960/05/31 Today's Date: 05/19/2014    History of Present Illness 54 yo female s/p L THA anterior direct   Clinical Impression   Patient is s/p L THA direct anterior surgery resulting in functional limitations due to the deficits listed below (see OT problem list). All education is complete and patient indicates understanding.  Patient will benefit from skilled OT acutely to increase independence and safety with ADLS to allow discharge home without dme or ot services.     Follow Up Recommendations  No OT follow up    Equipment Recommendations  None recommended by OT    Recommendations for Other Services       Precautions / Restrictions Precautions Precautions: None Restrictions Weight Bearing Restrictions: Yes LLE Weight Bearing: Weight bearing as tolerated      Mobility Bed Mobility Overal bed mobility: Needs Assistance Bed Mobility: Supine to Sit     Supine to sit: Supervision     General bed mobility comments: cues for sequence- first time up  Transfers Overall transfer level: Needs assistance Equipment used: Rolling walker (2 wheeled) Transfers: Sit to/from Stand Sit to Stand: Min guard         General transfer comment: cues for hand placement and first time up    Balance Overall balance assessment: Needs assistance         Standing balance support: No upper extremity supported Standing balance-Leahy Scale: Fair                              ADL Overall ADL's : Needs assistance/impaired Eating/Feeding: Independent   Grooming: Wash/dry face;Wash/dry hands;Supervision/safety   Upper Body Bathing: Set up   Lower Body Bathing: Minimal assistance;Sit to/from stand Lower Body Bathing Details (indicate cue type and reason): plans to have family (A). pt has reacher at home to dress with as well         Toilet Transfer:  Minimal assistance;Ambulation;BSC;RW Toilet Transfer Details (indicate cue type and reason): cues for hand placement and L LE extension. Min (A) due to first time up  Toileting- ArchitectClothing Manipulation and Hygiene: Supervision/safety       Functional mobility during ADLs: Min guard;Rolling walker General ADL Comments: pt completed bed mobility, toilet transfer, shower simlation and sink level grooming. pt educated on LB dressing and bathing with sister present for education. All education complete.      Vision     Perception     Praxis      Pertinent Vitals/Pain Pain Assessment: 0-10 Pain Score: 6  Pain Location: L LE Pain Descriptors / Indicators: Constant Pain Intervention(s): Premedicated before session;Monitored during session     Hand Dominance Right   Extremity/Trunk Assessment Upper Extremity Assessment Upper Extremity Assessment: Overall WFL for tasks assessed   Lower Extremity Assessment Lower Extremity Assessment: Defer to PT evaluation   Cervical / Trunk Assessment Cervical / Trunk Assessment: Normal   Communication Communication Communication: No difficulties   Cognition Arousal/Alertness: Awake/alert Behavior During Therapy: WFL for tasks assessed/performed Overall Cognitive Status: Within Functional Limits for tasks assessed                     General Comments       Exercises       Shoulder Instructions      Home Living Family/patient expects to be discharged to:: Private residence Living Arrangements:  Spouse/significant other Available Help at Discharge: Family Type of Home: House             Bathroom Shower/Tub: Walk-in Soil scientist Toilet: Standard     Home Equipment: Bedside commode;Shower seat;Walker - 2 wheels          Prior Functioning/Environment Level of Independence: Independent             OT Diagnosis: Generalized weakness;Acute pain   OT Problem List:     OT Treatment/Interventions:      OT  Goals(Current goals can be found in the care plan section) Acute Rehab OT Goals Patient Stated Goal: none stated  OT Frequency:     Barriers to D/C:            Co-evaluation              End of Session Equipment Utilized During Treatment: Rolling walker Nurse Communication: Mobility status;Precautions  Activity Tolerance: Patient tolerated treatment well Patient left: in chair;with call bell/phone within reach;with family/visitor present   Time: 0912-0927 OT Time Calculation (min): 15 min Charges:  OT General Charges $OT Visit: 1 Procedure OT Evaluation $Initial OT Evaluation Tier I: 1 Procedure G-Codes:    Boone Master B 06/01/14, 10:01 AM Pager: [phone]

## 2014-05-19 NOTE — Care Management Utilization Note (Signed)
Utilization review completed.  

## 2014-05-19 NOTE — Care Management Note (Signed)
CARE MANAGEMENT NOTE 05/19/2014  Patient:  Tracy Bryan,Tracy Bryan   Account Number:  [id number]  Date Initiated:  05/19/2014  Documentation initiated by:  Vance PeperBRADY,Sharley Keeler  Subjective/Objective Assessment:   54 yr old female admitted with left hip osteoarthritis. Patient underwent a left total hip arthroplasty.     Action/Plan:   Case manager spoke with patient and her husband. [facility] no [facility]. pateint has rolling walker and 3in1. has family support at discharge.   Anticipated DC Date:  05/20/2014   Anticipated DC Plan:  HOME W HOME HEALTH SERVICES      DC Planning Services  CM consult      [facility]   Choice offered to / List presented to:  C-1 Patient   DME arranged  NA        HH arranged  HH-2 PT      [facility]   Status of service:  Completed, signed off Medicare Important Message given?   (If response is "NO", the following Medicare IM given date fields will be blank) Date Medicare IM given:   Medicare IM given by:   Date Additional Medicare IM given:   Additional Medicare IM given by:    Discharge Disposition:  HOME W HOME HEALTH SERVICES  Per UR Regulation:  Reviewed for med. necessity/level of care/duration of stay  If discussed at Long Length of Stay Meetings, dates discussed:    Comments:

## 2014-05-19 NOTE — Evaluation (Signed)
Physical Therapy Evaluation Patient Details Name: Tracy Bryan MRN: [id number] DOB: 07/17/1960 Today's Date: 05/19/2014   History of Present Illness  54 yo female s/p L THA anterior direct. Pt's PMH includes PONV, HTN, DM, and GERD.   Clinical Impression  Pt is s/p L THA resulting in the deficits listed below (see PT Problem List). Pt ambulated 175 ft and ascended/descended 4 steps this session. Pt will benefit from skilled PT to increase their independence and safety with mobility to allow discharge to the venue listed below.  PT will continue to follow acutely.     Follow Up Recommendations Home health PT;Supervision/Assistance - 24 hour    Equipment Recommendations  None recommended by PT    Recommendations for Other Services       Precautions / Restrictions Precautions Precautions: Fall Restrictions Weight Bearing Restrictions: Yes LLE Weight Bearing: Weight bearing as tolerated      Mobility  Bed Mobility Overal bed mobility: Needs Assistance Bed Mobility: Supine to Sit;Sit to Supine     Supine to sit: Modified independent (Device/Increase time) Sit to supine: Modified independent (Device/Increase time)   General bed mobility comments: cues for sequencing and leg hook technique.  Increased time.    Transfers Overall transfer level: Needs assistance Equipment used: Rolling walker (2 wheeled) Transfers: Sit to/from Stand Sit to Stand: Min guard         General transfer comment: no verbal cues necessary, proper technique used  Ambulation/Gait Ambulation/Gait assistance: Min guard Ambulation Distance (Feet): 175 Feet Assistive device: Rolling walker (2 wheeled) Gait Pattern/deviations: Step-through pattern;Decreased stride length;Decreased stance time - left;Decreased weight shift to left;Antalgic   Gait velocity interpretation: Below normal speed for age/gender General Gait Details: dec L knee flexion which was corrected following verbal cues to allow  knee to bend.  Pt w/ dec gait speed.  Stairs Stairs: Yes Stairs assistance: Supervision Stair Management: Two rails;Step to pattern;Forwards Number of Stairs: 2 (x2) General stair comments: Cues for sequencing (first time)   Wheelchair Mobility    Modified Rankin (Stroke Patients Only)       Balance Overall balance assessment: Needs assistance Sitting-balance support: No upper extremity supported;Feet supported Sitting balance-Leahy Scale: Fair     Standing balance support: Bilateral upper extremity supported;During functional activity Standing balance-Leahy Scale: Fair                               Pertinent Vitals/Pain Pain Assessment: 0-10 Pain Score: 5  Pain Location: L hip (dec w/ ambulation) Pain Descriptors / Indicators: Aching;Discomfort;Dull Pain Intervention(s): Limited activity within patient's tolerance;Monitored during session;Repositioned    Home Living Family/patient expects to be discharged to:: Private residence Living Arrangements: Spouse/significant other;Other relatives (sister and husband) Available Help at Discharge: Family;Available 24 hours/day Type of Home: House Home Access: Stairs to enter Entrance Stairs-Rails: Can reach both;Right;Left Entrance Stairs-Number of Steps: 4 Home Layout: One level;Able to live on main level with bedroom/bathroom Home Equipment: Bedside commode;Shower seat;Walker - 2 wheels      Prior Function Level of Independence: Independent               Hand Dominance   Dominant Hand: Right    Extremity/Trunk Assessment   Upper Extremity Assessment: Defer to OT evaluation           Lower Extremity Assessment: LLE deficits/detail   LLE Deficits / Details: s/p L THA  Cervical / Trunk Assessment: Normal  Communication   Communication:  No difficulties  Cognition Arousal/Alertness: Awake/alert Behavior During Therapy: WFL for tasks assessed/performed Overall Cognitive Status: Within  Functional Limits for tasks assessed                      General Comments      Exercises Total Joint Exercises Ankle Circles/Pumps: AROM;Both;10 reps;Supine Quad Sets: AROM;Both;5 reps;Supine Heel Slides: AROM;Left;5 reps;Supine Long Arc Quad: AROM;Left;5 reps;Seated      Assessment/Plan    PT Assessment Patient needs continued PT services  PT Diagnosis Difficulty walking;Acute pain;Generalized weakness;Abnormality of gait   PT Problem List Decreased strength;Decreased activity tolerance;Decreased range of motion;Decreased balance;Decreased mobility;Decreased coordination;Decreased knowledge of use of DME;Decreased safety awareness;Decreased knowledge of precautions;Pain  PT Treatment Interventions DME instruction;Gait training;Stair training;Functional mobility training;Therapeutic activities;Therapeutic exercise;Balance training;Neuromuscular re-education;Patient/family education;Modalities   PT Goals (Current goals can be found in the Care Plan section) Acute Rehab PT Goals Patient Stated Goal: none stated PT Goal Formulation: With patient/family Time For Goal Achievement: 05/26/14 Potential to Achieve Goals: Good    Frequency 7X/week   Barriers to discharge Inaccessible home environment 4 steps to enter home    Co-evaluation               End of Session Equipment Utilized During Treatment: Gait belt Activity Tolerance: Patient tolerated treatment well Patient left: in bed;with call bell/phone within reach;with family/visitor present Nurse Communication: Mobility status;Precautions;Weight bearing status (air in line of IV)         Time: [id number] PT Time Calculation (min) (ACUTE ONLY): 31 min   Charges:   PT Evaluation $Initial PT Evaluation Tier I: 1 Procedure PT Treatments $Gait Training: 8-22 mins   PT G CodesMichail Jewels:       Ashley Parr PT, [place]PT [phone]  [phone] 05/19/2014, 11:23 AM

## 2014-05-19 NOTE — Progress Notes (Signed)
Subjective: 1 Day Post-Op Procedure(s) (LRB): LEFT TOTAL HIP ARTHROPLASTY ANTERIOR APPROACH (Left)  Activity level:  wbat Diet tolerance:  eating well Voiding:  ok Patient reports pain as mild and moderate.    Objective: Vital signs in last 24 hours: Temp:  [97.5 F (36.4 C)-98.7 F (37.1 C)] 98.7 F (37.1 C) (04/06 0508) Pulse Rate:  [50-118] 118 (04/06 0508) Resp:  [11-23] 16 (04/06 0508) BP: (101-134)/(63-78) 107/65 mmHg (04/06 0508) SpO2:  [93 %-100 %] 95 % (04/06 0508) Weight:  [59.013 kg (130 lb 1.6 oz)] 59.013 kg (130 lb 1.6 oz) (04/05 1058)  Labs:  Recent Labs  05/19/14 0538  HGB 10.0*    Recent Labs  05/19/14 0538  WBC 8.0  RBC 3.58*  HCT 30.4*  PLT 242    Recent Labs  05/19/14 0538  NA 134*  K 3.3*  CL 96  CO2 28  BUN 12  CREATININE 0.93  GLUCOSE 217*  CALCIUM 8.3*   No results for input(s): LABPT, INR in the last 72 hours.  Physical Exam:  Neurologically intact ABD soft Neurovascular intact Sensation intact distally Intact pulses distally Dorsiflexion/Plantar flexion intact Incision: dressing C/D/I and no drainage No cellulitis present Compartment soft  Assessment/Plan:  1 Day Post-Op Procedure(s) (LRB): LEFT TOTAL HIP ARTHROPLASTY ANTERIOR APPROACH (Left) Advance diet Up with therapy D/C IV fluids Plan for discharge tomorrow Discharge home with home health if doing well and cleared by PT. Continue on ASA 325mg  BID x 4 weeks post op. Follow up in office 2 weeks post op.   Jeffree Cazeau, Ginger OrganNDREW PAUL 05/19/2014, 7:51 AM

## 2014-05-20 LAB — CBC
HCT: 28.5 % — ABNORMAL LOW (ref 36.0–46.0)
HEMOGLOBIN: 9.5 g/dL — AB (ref 12.0–15.0)
MCH: 28.2 pg (ref 26.0–34.0)
MCHC: 33.3 g/dL (ref 30.0–36.0)
MCV: 84.6 fL (ref 78.0–100.0)
Platelets: 252 10*3/uL (ref 150–400)
RBC: 3.37 MIL/uL — ABNORMAL LOW (ref 3.87–5.11)
RDW: 13.3 % (ref 11.5–15.5)
WBC: 9.2 10*3/uL (ref 4.0–10.5)

## 2014-05-20 LAB — GLUCOSE, CAPILLARY
GLUCOSE-CAPILLARY: 115 mg/dL — AB (ref 70–99)
Glucose-Capillary: 90 mg/dL (ref 70–99)

## 2014-05-20 MED ORDER — HYDROCODONE-ACETAMINOPHEN 5-325 MG PO TABS: 1.0000 | ORAL_TABLET | ORAL | Status: DC | PRN

## 2014-05-20 MED ORDER — ASPIRIN 325 MG PO TBEC: 325.0000 mg | DELAYED_RELEASE_TABLET | Freq: Two times a day (BID) | ORAL | Status: DC

## 2014-05-20 MED ORDER — METHOCARBAMOL 500 MG PO TABS: 500.0000 mg | ORAL_TABLET | Freq: Four times a day (QID) | ORAL | Status: DC | PRN

## 2014-05-20 NOTE — Progress Notes (Signed)
Discharge instructions reviewed with patient/family. All questions answered at this time. RXs given to patient. Awaiting for transport.   Isador Castille, RN 

## 2014-05-20 NOTE — Discharge Summary (Signed)
Patient ID: Tracy Bryan MRN: [id number] DOB/AGE: [birth year] 54 y.o.  Admit date: 05/18/2014 Discharge date: 05/20/2014  Admission Diagnoses:  Principal Problem:   Primary osteoarthritis of left hip Active Problems:   Diabetes   Discharge Diagnoses:  Same  Past Medical History  Diagnosis Date  . PONV (postoperative nausea and vomiting)   . Hypertension   . Pneumonia   . Diabetes mellitus without complication     Type 2  . GERD (gastroesophageal reflux disease)   . Arthritis   . Hypercholesteremia   . Disseminated intravascular coagulation 1999    After child birth    Surgeries: Procedure(s): LEFT TOTAL HIP ARTHROPLASTY ANTERIOR APPROACH on 05/18/2014   Consultants:    Discharged Condition: Improved  Hospital Course: Tracy Bryan is an 54 y.o. female who was admitted 05/18/2014 for operative treatment ofPrimary osteoarthritis of left hip. Patient has severe unremitting pain that affects sleep, daily activities, and work/hobbies. After pre-op clearance the patient was taken to the operating room on 05/18/2014 and underwent  Procedure(s): LEFT TOTAL HIP ARTHROPLASTY ANTERIOR APPROACH.    Patient was given perioperative antibiotics: Anti-infectives    Start     Dose/Rate Route Frequency Ordered Stop   05/18/14 1800  ceFAZolin (ANCEF) IVPB 2 g/50 mL premix     2 g 100 mL/hr over 30 Minutes Intravenous Every 6 hours 05/18/14 1610 05/19/14 0034   05/18/14 0600  ceFAZolin (ANCEF) IVPB 2 g/50 mL premix     2 g 100 mL/hr over 30 Minutes Intravenous On call to O.R. 05/17/14 1316 05/18/14 1200       Patient was given sequential compression devices, early ambulation, and chemoprophylaxis to prevent DVT.  Patient benefited maximally from hospital stay and there were no complications.    Recent vital signs: Patient Vitals for the past 24 hrs:  BP Temp Temp src Pulse Resp SpO2  05/20/14 1200 - - - - 16 100 %  05/20/14 0800 - - - - 16 95 %  05/20/14 0637 94/61 mmHg  98.2 F (36.8 C) Oral 99 - 99 %  05/19/14 1950 (!) 101/59 mmHg 99.2 F (37.3 C) Oral 95 - 98 %     Recent laboratory studies:  Recent Labs  05/19/14 0538 05/20/14 0617  WBC 8.0 9.2  HGB 10.0* 9.5*  HCT 30.4* 28.5*  PLT 242 252  NA 134*  --   K 3.3*  --   CL 96  --   CO2 28  --   BUN 12  --   CREATININE 0.93  --   GLUCOSE 217*  --   CALCIUM 8.3*  --      Discharge Medications:     Medication List    STOP taking these medications        aspirin 81 MG tablet  Replaced by:  aspirin 325 MG EC tablet      TAKE these medications        aspirin 325 MG EC tablet  Take 1 tablet (325 mg total) by mouth 2 (two) times daily after a meal.     chlorthalidone 25 MG tablet  Commonly known as:  HYGROTON  Take 25 mg by mouth daily.     CRANBERRY PO  Take 4,200 mg by mouth daily.     DULoxetine 60 MG capsule  Commonly known as:  CYMBALTA  Take 60 mg by mouth 2 (two) times daily.     fenofibrate micronized 134 MG capsule  Commonly known as:  LOFIBRA  Take  134 mg by mouth daily.     FIBER PO  Take 1 tablet by mouth every other day.     glucose blood test strip  1 each by Other route daily. Use as instructed     HYDROcodone-acetaminophen 5-325 MG per tablet  Commonly known as:  NORCO/VICODIN  Take 1-2 tablets by mouth every 4 (four) hours as needed (breakthrough pain).     metFORMIN 500 MG tablet  Commonly known as:  GLUCOPHAGE  Take 500 mg by mouth 2 (two) times daily with a meal.     methocarbamol 500 MG tablet  Commonly known as:  ROBAXIN  Take 1 tablet (500 mg total) by mouth every 6 (six) hours as needed for muscle spasms.     metoprolol 50 MG tablet  Commonly known as:  LOPRESSOR  Take 50 mg by mouth daily.     multivitamin with minerals tablet  Take 1 tablet by mouth daily.     POTASSIUM PO  Take 1 tablet by mouth daily.     simvastatin 40 MG tablet  Commonly known as:  ZOCOR  Take 40 mg by mouth at bedtime.        Diagnostic Studies: Dg  Chest 2 View  05/06/2014   CLINICAL DATA:  Preoperative exam prior to left hip arthroplasty ; history of hypertension and diabetes and reflux  EXAM: CHEST  2 VIEW  COMPARISON:  PA and lateral chest x-ray of Jun 24, 2008  FINDINGS: The lungs are adequately inflated and clear. The heart and pulmonary vascularity are normal. The mediastinum is normal in width. There is no pleural effusion. The bony thorax is unremarkable.  IMPRESSION: There is no active cardiopulmonary disease.   Electronically Signed   By: David  [place]   On: 05/06/2014 11:34   Dg Hip Operative Unilat With Pelvis Left  05/18/2014   CLINICAL DATA:  Left hip arthroplasty  EXAM: OPERATIVE LEFT HIP 2 VIEWS  TECHNIQUE: Fluoroscopic spot image(s) were submitted for interpretation post-operatively.  FLUOROSCOPY TIME:  If the device does not provide the exposure index:  Fluoroscopy Time:  24 seconds  Number of Acquired Images:  2  COMPARISON:  None.  FINDINGS: Intraoperative fluoroscopic images demonstrated left hip arthroplasty in satisfactory position.  Prior right hip arthroplasty.  IMPRESSION: Intraoperative fluoroscopic images during left hip ORIF.   Electronically Signed   By: Charline Bills M.D.   On: 05/18/2014 15:54    Disposition:       Discharge Instructions    Call MD / Call 911    Complete by:  As directed   If you experience chest pain or shortness of breath, CALL 911 and be transported to the hospital emergency room.  If you develope a fever above 101 F, pus (white drainage) or increased drainage or redness at the wound, or calf pain, call your surgeon's office.     Constipation Prevention    Complete by:  As directed   Drink plenty of fluids.  Prune juice may be helpful.  You may use a stool softener, such as Colace (over the counter) 100 mg twice a day.  Use MiraLax (over the counter) for constipation as needed.     Diet - low sodium heart healthy    Complete by:  As directed      Discharge instructions    Complete by:   As directed   INSTRUCTIONS AFTER JOINT REPLACEMENT   Remove items at home which could result in a fall. This includes throw rugs  or furniture in walking pathways ICE to the affected joint every three hours while awake for 30 minutes at a time, for at least the first 3-5 days, and then as needed for pain and swelling.  Continue to use ice for pain and swelling. You may notice swelling that will progress down to the foot and ankle.  This is normal after surgery.  Elevate your leg when you are not up walking on it.   Continue to use the breathing machine you got in the hospital (incentive spirometer) which will help keep your temperature down.  It is common for your temperature to cycle up and down following surgery, especially at night when you are not up moving around and exerting yourself.  The breathing machine keeps your lungs expanded and your temperature down.   DIET:  As you were doing prior to hospitalization, we recommend a well-balanced diet.  DRESSING / WOUND CARE / SHOWERING  Keep dressing clean and dry until follow up in office  ACTIVITY  Increase activity slowly as tolerated, but follow the weight bearing instructions below.   No driving for 6 weeks or until further direction given by your physician.  You cannot drive while taking narcotics.  No lifting or carrying greater than 10 lbs. until further directed by your surgeon. Avoid periods of inactivity such as sitting longer than an hour when not asleep. This helps prevent blood clots.  You may return to work once you are authorized by your doctor.     WEIGHT BEARING   Weight bearing as tolerated with assist device (walker, cane, etc) as directed, use it as long as suggested by your surgeon or therapist, typically at least 4-6 weeks.   EXERCISES  Results after joint replacement surgery are often greatly improved when you follow the exercise, range of motion and muscle strengthening exercises prescribed by your doctor.  Safety measures are also important to protect the joint from further injury. Any time any of these exercises cause you to have increased pain or swelling, decrease what you are doing until you are comfortable again and then slowly increase them. If you have problems or questions, call your caregiver or physical therapist for advice.   Rehabilitation is important following a joint replacement. After just a few days of immobilization, the muscles of the leg can become weakened and shrink (atrophy).  These exercises are designed to build up the tone and strength of the thigh and leg muscles and to improve motion. Often times heat used for twenty to thirty minutes before working out will loosen up your tissues and help with improving the range of motion but do not use heat for the first two weeks following surgery (sometimes heat can increase post-operative swelling).   These exercises can be done on a training (exercise) mat, on the floor, on a table or on a bed. Use whatever works the best and is most comfortable for you.    Use music or television while you are exercising so that the exercises are a pleasant break in your day. This will make your life better with the exercises acting as a break in your routine that you can look forward to.   Perform all exercises about fifteen times, three times per day or as directed.  You should exercise both the operative leg and the other leg as well.   Exercises include:   Quad Sets - Tighten up the muscle on the front of the thigh (Quad) and hold for 5-10 seconds.  Straight Leg Raises - With your knee straight (if you were given a brace, keep it on), lift the leg to 60 degrees, hold for 3 seconds, and slowly lower the leg.  Perform this exercise against resistance later as your leg gets stronger.  Leg Slides: Lying on your back, slowly slide your foot toward your buttocks, bending your knee up off the floor (only go as far as is comfortable). Then slowly slide your  foot back down until your leg is flat on the floor again.  Angel Wings: Lying on your back spread your legs to the side as far apart as you can without causing discomfort.  Hamstring Strength:  Lying on your back, push your heel against the floor with your leg straight by tightening up the muscles of your buttocks.  Repeat, but this time bend your knee to a comfortable angle, and push your heel against the floor.  You may put a pillow under the heel to make it more comfortable if necessary.   A rehabilitation program following joint replacement surgery can speed recovery and prevent re-injury in the future due to weakened muscles. Contact your doctor or a physical therapist for more information on knee rehabilitation.    CONSTIPATION  Constipation is defined medically as fewer than three stools per week and severe constipation as less than one stool per week.  Even if you have a regular bowel pattern at home, your normal regimen is likely to be disrupted due to multiple reasons following surgery.  Combination of anesthesia, postoperative narcotics, change in appetite and fluid intake all can affect your bowels.   YOU MUST use at least one of the following options; they are listed in order of increasing strength to get the job done.  They are all available over the counter, and you may need to use some, POSSIBLY even all of these options:    Drink plenty of fluids (prune juice may be helpful) and high fiber foods Colace 100 mg by mouth twice a day  Senokot for constipation as directed and as needed Dulcolax (bisacodyl), take with full glass of water  Miralax (polyethylene glycol) once or twice a day as needed.  If you have tried all these things and are unable to have a bowel movement in the first 3-4 days after surgery call either your surgeon or your primary doctor.    If you experience loose stools or diarrhea, hold the medications until you stool forms back up.  If your symptoms do not get  better within 1 week or if they get worse, check with your doctor.  If you experience "the worst abdominal pain ever" or develop nausea or vomiting, please contact the office immediately for further recommendations for treatment.   ITCHING:  If you experience itching with your medications, try taking only a single pain pill, or even half a pain pill at a time.  You can also use Benadryl over the counter for itching or also to help with sleep.   TED HOSE STOCKINGS:  Use stockings on both legs until for at least 2 weeks or as directed by physician office. They may be removed at night for sleeping.  MEDICATIONS:  See your medication summary on the "After Visit Summary" that nursing will review with you.  You may have some home medications which will be placed on hold until you complete the course of blood thinner medication.  It is important for you to complete the blood thinner medication as prescribed.  PRECAUTIONS:  If you experience chest pain or shortness of breath - call 911 immediately for transfer to the hospital emergency department.   If you develop a fever greater that 101 F, purulent drainage from wound, increased redness or drainage from wound, foul odor from the wound/dressing, or calf pain - CONTACT YOUR SURGEON.                                                   FOLLOW-UP APPOINTMENTS:  If you do not already have a post-op appointment, please call the office for an appointment to be seen by your surgeon.  Guidelines for how soon to be seen are listed in your "After Visit Summary", but are typically between 1-4 weeks after surgery.  OTHER INSTRUCTIONS:   Knee Replacement:  Do not place pillow under knee, focus on keeping the knee straight while resting. CPM instructions: 0-90 degrees, 2 hours in the morning, 2 hours in the afternoon, and 2 hours in the evening. Place foam block, curve side up under heel at all times except when in CPM or when walking.  DO NOT modify, tear, cut, or change  the foam block in any way.  MAKE SURE YOU:  Understand these instructions.  Get help right away if you are not doing well or get worse.    Thank you for letting us be a part of your medical care team.  It is a privilege we respect greatly.  We hope these instructions will help you stay on track for a fast and full recovery!     Increase activity slowly as tolerated    Complete by:  As directed            Follow-up Information    Follow up with Velna OchsALLDORF,PETER G, MD. Schedule an appointment as soon as possible for a visit in 2 weeks.   Specialty:  Orthopedic Surgery   Contact information:   [address] [place] [address] [phone]       [facility]   Why:  Someone [facility] will contact you concerning start date and time for therapy.   Contact information:   [address] [place] [address] [phone]        Signed: Drema HalonIDA, Nishika Parkhurst PAUL 05/20/2014, 1:12 PM

## 2014-05-20 NOTE — Progress Notes (Signed)
Physical Therapy Treatment Patient Details Name: Tracy Bryan MRN: [id number] DOB: 01-Sep-1960 Today's Date: 05/20/2014    History of Present Illness [age] yo female s/p L THA anterior direct. Pt's PMH includes PONV, HTN, DM, and GERD.     PT Comments    Pt is progressing toward goals and increasing ambulation distance. Pt. Safe to D/C from a mobility standpoint based on progression toward goals set on PT eval. Anticipate D/C later today.  Follow Up Recommendations  Home health PT;Supervision/Assistance - 24 hour     Equipment Recommendations  None recommended by PT    Recommendations for Other Services       Precautions / Restrictions Precautions Precautions: Fall Restrictions LLE Weight Bearing: Weight bearing as tolerated    Mobility  Bed Mobility Overal bed mobility: Modified Independent Bed Mobility: Supine to Sit     Supine to sit: Modified independent (Device/Increase time) Sit to supine: Modified independent (Device/Increase time)   General bed mobility comments: Pt safely uses leg hook technique.  Transfers Overall transfer level: Needs assistance Equipment used: Rolling walker (2 wheeled)   Sit to Stand: Supervision         General transfer comment: no verbal cues necessary, proper technique used  Ambulation/Gait Ambulation/Gait assistance: Min guard Ambulation Distance (Feet): 200 Feet Assistive device: Rolling walker (2 wheeled) Gait Pattern/deviations: Step-through pattern;Decreased stride length;Decreased stance time - left;Decreased weight shift to left   Gait velocity interpretation: Below normal speed for age/gender General Gait Details: Pt required cues to keep RW moving and was then able to maintain step through pattern.   Stairs   Stairs assistance: Supervision Stair Management: Two rails;Step to pattern;Forwards Number of Stairs: 5 General stair comments: Pt able to complete stair training with safe technique.  Wheelchair  Mobility    Modified Rankin (Stroke Patients Only)       Balance                                    Cognition Arousal/Alertness: Awake/alert Behavior During Therapy: WFL for tasks assessed/performed Overall Cognitive Status: Within Functional Limits for tasks assessed                      Exercises Total Joint Exercises Ankle Circles/Pumps: AROM;Both;10 reps Quad Sets: AROM;Both;10 reps Heel Slides: AAROM;Left;10 reps    General Comments        Pertinent Vitals/Pain Pain Assessment: 0-10 Pain Location: L hip Pain Descriptors / Indicators: Sore Pain Intervention(s): Monitored during session    Home Living                      Prior Function            PT Goals (current goals can now be found in the care plan section) Progress towards PT goals: Progressing toward goals    Frequency  7X/week    PT Plan Current plan remains appropriate    Co-evaluation             End of Session Equipment Utilized During Treatment: Gait belt Activity Tolerance: Patient tolerated treatment well Patient left: in bed;with call bell/phone within reach;with family/visitor present     Time: 0852-0906 PT Time Calculation (min) (ACUTE ONLY): 14 min  Charges:                       G Codes:  Leonard Schwartz, SPTA 05/20/2014, 9:27 AM

## 2014-05-21 NOTE — Anesthesia Postprocedure Evaluation (Signed)
  Anesthesia Post-op Note  Patient: Tracy Bryan  Procedure(s) Performed: Procedure(s) (LRB): LEFT TOTAL HIP ARTHROPLASTY ANTERIOR APPROACH (Left)  Patient Location: PACU  Anesthesia Type: Spinal  Level of Consciousness: awake and alert   Airway and Oxygen Therapy: Patient Spontanous Breathing  Post-op Pain: mild  Post-op Assessment: Post-op Vital signs reviewed, Patient's Cardiovascular Status Stable, Respiratory Function Stable, Patent Airway and No signs of Nausea or vomiting  Last Vitals:  Filed Vitals:   05/20/14 1200  BP:   Pulse:   Temp:   Resp: 16    Post-op Vital Signs: stable   Complications: No apparent anesthesia complications

## 2014-06-05 NOTE — Discharge Summary (Signed)
Dates of Admission and Diagnosis:  Date of Admission 08-Jul-2013   Date of Discharge 09-Jul-2013   Admitting Diagnosis Angioedema   Final Diagnosis 1. Angioedema 2. HTN    Chief Complaint/History of Present Illness CHIEF COMPLAINT: ALLERGIC REACTION.   HISTORY OF PRESENT ILLNESS: The patient is a 54 year old Asian female with a past medical history of hypertension, hyperlipidemia and diabetes mellitus, who is presenting to the ER with a chief complaint of sudden onset of swelling of her face. The patient is reporting that she was in her usual state of health until this morning at around 3:00 a.m. She woke up with facial swelling and intensive itching throughout her body. Denies any shortness of breath. Denies any chest tightness either. The patient came into the ER and diagnosed with allergic reaction versus angioedema. She was given 1 dose of EpiPen, ranitidine and Benadryl. The patient admits that she takes losartan for her blood pressure. THE ALLERGIC REACTION WAS ASSUMED TO BE FROM LOSARTAN, AND IT WAS DISCONTINUED. The patient also received Solu-Medrol 125 mg IV, and eventually, the patient was able to open her left eye. Hospitalist team is called to admit the patient for observation. The patient is resting comfortably during my examination and feels like the itching is resolved and the facial swelling is going down. No other complaints. Denies any chest tightness. Denies any difficulty with swallowing. Husband is at bedside. No similar complaints in the past.   Allergies:  No Known Allergies:   Hepatic:  27-May-15 04:26   Bilirubin, Total 0.2  Alkaline Phosphatase 65 (45-117 NOTE: New Reference Range 01/02/13)  SGPT (ALT) 16  SGOT (AST) 18  Total Protein, Serum 6.7  Albumin, Serum 3.5  Routine Chem:  27-May-15 04:26   Glucose, Serum 99  BUN 10  Creatinine (comp) 0.92  Sodium, Serum 139  Potassium, Serum 3.5  Chloride, Serum  108  CO2, Serum 23  Calcium (Total), Serum 8.7   Osmolality (calc) 277  eGFR (African American) >60  eGFR (Non-African American) >60 (eGFR values <37m/min/1.73 m2 may be an indication of chronic kidney disease (CKD). Calculated eGFR is useful in patients with stable renal function. The eGFR calculation will not be reliable in acutely ill patients when serum creatinine is changing rapidly. It is not useful in  patients on dialysis. The eGFR calculation may not be applicable to patients at the low and high extremes of body sizes, pregnant women, and vegetarians.)  Anion Gap 8  Routine Hem:  27-May-15 04:26   WBC (CBC) 4.6  RBC (CBC) 3.92  Hemoglobin (CBC)  11.9  Hematocrit (CBC) 35.1  Platelet Count (CBC)  133  MCV 90  MCH 30.4  MCHC 34.0  RDW 12.5  Neutrophil % 48.4  Lymphocyte % 43.6  Monocyte % 6.8  Eosinophil % 0.6  Basophil % 0.6  Neutrophil # 2.2  Lymphocyte # 2.0  Monocyte # 0.3  Eosinophil # 0.0  Basophil # 0.0 (Result(s) reported on 08 Jul 2013 at 06:34AM.)   Pertinent Past History:  Pertinent Past History PAST MEDICAL HISTORY:  1. Hypertension.  2. Hyperlipidemia.  3. Diabetes mellitus.   4. History of sciatica.  PAST SURGICAL HISTORY:  1. Total hysterectomy. 2. Right-sided hip replacement.  3. Neck surgery.  4. Ovarian cyst removal.  5. Hernia repair. 6. DIC.   Hospital Course:  Hospital Course Admitted for facial swelling. Likely from ARB. This was held. Started on solumedrol, benadryl. Improved well. Swelling almost resolved. No dysphagia, tingue swelling, SOB. Change to Chlorthalidone and  metoprolol.  Today pt has no swelling. Lungs CTA S1, S2 Ambulated  Time spent on d/c 35 minutes   Condition on Discharge Fair   Code Status:  Code Status Full Code   DISCHARGE INSTRUCTIONS HOME MEDS:  Medication Reconciliation: Patient's Home Medications at Discharge:     Medication Instructions  cymbalta 60 mg oral delayed release capsule  1 cap(s) orally once a day   simvastatin 80 mg oral  tablet  1 tab(s) orally once a day (at bedtime)   metformin 500 mg oral tablet  2 tab(s) orally 2 times a day   multivitamin  1 tab(s) orally once a day   aspirin 81 mg oral tablet  1 tab(s) orally once a day   estradiol 0.5 mg oral tablet  1 tab(s) orally once a day   januvia 100 mg oral tablet  1 tab(s) orally once a day   potassium gluconate 550 mg oral tablet  1 tab(s) orally once a day   prednisone 10 mg oral tablet  4 tabs day 1 and taper by 10 mg a day. total 4 days   metoprolol tartrate 50 mg oral tablet  1 tab(s) orally 2 times a day   benadryl 25 mg oral tablet  1 tab(s) orally 3 times a day, As Needed . swelling   chlorthalidone 50 mg oral tablet  1 tab(s) orally once a day    STOP TAKING THE FOLLOWING MEDICATION(S):    hydrochlorothiazide-losartan 25 mg-100 mg oral tablet: 1 tab(s) orally once a day  Physician's Instructions:  Diet Low Sodium  Carbohydrate Controlled (ADA) Diet   Activity Limitations As tolerated   Return to Work In 3 days   Time frame for Follow Up Appointment 1-2 weeks  PCP   Electronic Signatures: Carrin Vannostrand, Lottie Dawson (MD)  (Signed 28-May-15 12:17)  Authored: ADMISSION DATE AND DIAGNOSIS, CHIEF COMPLAINT/HPI, Allergies, PERTINENT LABS, [place], PATIENT INSTRUCTIONS   Last Updated: 28-May-15 12:17 by Alba Destine (MD)

## 2014-06-05 NOTE — H&P (Signed)
PATIENT NAME:  Tracy Bryan, Tracy Bryan MR#:  [id number] DATE OF BIRTH:  06-04-1960  DATE OF ADMISSION:  07/08/2013  PRIMARY CARE PHYSICIAN: Steele Sizer, MD  CHIEF COMPLAINT: ALLERGIC REACTION.   HISTORY OF PRESENT ILLNESS: The patient is a 54 year old Asian female with a past medical history of hypertension, hyperlipidemia and diabetes mellitus, who is presenting to the ER with a chief complaint of sudden onset of swelling of her face. The patient is reporting that she was in her usual state of health until this morning at around 3:00 a.m. She woke up with facial swelling and intensive itching throughout her body. Denies any shortness of breath. Denies any chest tightness either. The patient came into the ER and diagnosed with allergic reaction versus angioedema. She was given 1 dose of EpiPen, ranitidine and Benadryl. The patient admits that she takes losartan for her blood pressure. THE ALLERGIC REACTION WAS ASSUMED TO BE FROM LOSARTAN, AND IT WAS DISCONTINUED. The patient also received Solu-Medrol 125 mg IV, and eventually, the patient was able to open her left eye. Hospitalist team is called to admit the patient for observation. The patient is resting comfortably during my examination and feels like the itching is resolved and the facial swelling is going down. No other complaints. Denies any chest tightness. Denies any difficulty with swallowing. Husband is at bedside. No similar complaints in the past.   PAST MEDICAL HISTORY:  1. Hypertension.  2. Hyperlipidemia.  3. Diabetes mellitus.   4. History of sciatica.  PAST SURGICAL HISTORY:  1. Total hysterectomy. 2. Right-sided hip replacement.  3. Neck surgery.  4. Ovarian cyst removal.  5. Hernia repair. 6. DIC.   ALLERGIES: NO KNOWN DRUG ALLERGIES, BUT NOW LOSARTAN IS ADDED TO THE MEDICATION ALLERGY LIST.   PSYCHOSOCIAL HISTORY: Lives at home with husband. No history of smoking, alcohol or illicit drug usage.   FAMILY HISTORY:  Hypertension and diabetes run in her family.   HOME MEDICATIONS:  1. Simvastatin 80 mg p.o. once daily. 2. Phenazopyridine 2 tablets p.o. 3 times a day.  3. Metformin 500 mg b.i.d. 4. Januvia dose unknown p.o. daily.  5. Hydrochlorothiazide/losartan p.o. once daily. 6. Cymbalta 60 mg 1 capsule p.o. once daily.  7. Estradiol p.o. once daily dose unknown.   REVIEW OF SYSTEMS: CONSTITUTIONAL: Denies any fever or fatigue.  EYES: Denies blurry vision, but eyes are swollen. Denies any glaucoma.  ENT: Denies epistaxis or discharge. No snoring. Uvula is midline. No postnasal drip.  RESPIRATION: Denies cough, wheezing. Denies any shortness of breath, COPD.  CARDIOVASCULAR: No chest pain, palpitations, syncope.  GASTROINTESTINAL: Denies nausea, vomiting, diarrhea, abdominal pain, hematemesis.  GENITOURINARY: No dysuria, hematuria or renal colic. GYNECOLOGIC AND BREASTS: Denies any breast mass. Status post hysterectomy.  ENDOCRINE: Denies polyuria, nocturia, thyroid problems. No heat or cold intolerance. HEMATOLOGIC AND LYMPHATIC: No anemia, easy bruising or bleeding.  INTEGUMENTARY: No acne. No lesions. Has facial edema.  MUSCULOSKELETAL: No joint pain, neck pain, back pain or shoulder pain. Denies any gout. NEUROLOGIC: Denies vertigo, ataxia and dysarthria.  PSYCHIATRIC: No ADD, OCD.   PHYSICAL EXAMINATION:  VITAL SIGNS: Temperature: The patient is afebrile. Pulse 113, respirations 18, blood pressure 104/70, pulse oximetry 100%.  GENERAL APPEARANCE: Not under acute distress. Moderately built and nourished.  HEENT: Normocephalic, atraumatic. Significant facial edema is present. Eyes are almost shut, but the patient is able to open her left eye. Conjunctivae are clear. No scleral icterus. No sinus tenderness. No postnasal drip. Lips are not swollen. Tongue is  normal in size, and no swelling of the tongue. Uvula is midline.  NECK: Supple. No JVD. No thyromegaly. Range of motion is intact.  LUNGS:  Clear to auscultation bilaterally. No accessory muscle usage. No anterior chest wall tenderness on palpation.  CARDIAC: S1, S2 normal. Regular rate and rhythm. No murmurs.  GASTROINTESTINAL: Soft. Bowel sounds are positive in all 4 quadrants. Nontender, nondistended. No hepatosplenomegaly. No masses felt.  NEUROLOGIC: Awake, alert, oriented x3. Cranial nerves II through XII are grossly intact. Motor and sensory are intact. Reflexes are 2+.  EXTREMITIES: No edema. No cyanosis. No clubbing.  SKIN: Warm to touch. Normal turgor. No rashes. No lesions.  MUSCULOSKELETAL: No joint effusion, tenderness or erythema.  PSYCHIATRIC: Normal mood and affect.   LABORATORIES AND IMAGING STUDIES: BMP is normal except chloride at 108. LFTs are normal. WBC 4.6, hemoglobin 11.9, hematocrit is normal, platelet count is at 133, MCV 90.   ASSESSMENT AND PLAN: A 54 year old Asian female presenting to the ER with a chief complaint of swelling of her face. Will be admitted with the following assessment and plan.   1. FACIAL EDEMA IS PROBABLY FROM ALLERGIC REACTION, WHICH COULD BE FROM LOSARTAN; THIS IS DISCONTINUED. Will admit the patient under observation status. Will provide her Benadryl, ranitidine and Solu-Medrol 40 mg IV q.12 hours. Will discontinue losartan.  2. Diabetes mellitus. The patient will be on her home medication, metformin, and provide her sliding scale insulin.  3. Hypertension. Continue home medications and up-titrate as-needed basis.  4. Hyperlipidemia. Continue simvastatin.  5. Will provide gastrointestinal and deep vein thrombosis prophylaxis.   CODE STATUS: She is full code. Husband is the medical power of attorney.   Diagnosis and plan of care were discussed in detail with the patient and her husband at bedside. They both verbalized understanding of the plan.   TOTAL TIME SPENT ON ADMISSION: 45 minutes.    ____________________________ Ramonita LabAruna Chan Sheahan, MD ag:lb D: 07/08/2013 07:27:50  ET T: 07/08/2013 07:58:24 ET JOB#: [id number]  cc: Ramonita LabAruna Zahriyah Joo, MD, <Dictator> Steele SizerMark A. Crissman, MD Ramonita LabARUNA Luverta Korte MD ELECTRONICALLY SIGNED 07/19/2013 0:47

## 2014-06-08 ENCOUNTER — Encounter
Admit: 2014-06-08 | Disposition: A | Discharge status: Home / Self Care | Payer: Self-pay | Attending: Orthopaedic Surgery | Admitting: Orthopaedic Surgery

## 2014-06-15 ENCOUNTER — Encounter: Payer: Self-pay | Admitting: Physical Therapy

## 2014-06-15 ENCOUNTER — Ambulatory Visit: Payer: BLUE CROSS/BLUE SHIELD | Attending: Orthopaedic Surgery | Admitting: Physical Therapy

## 2014-06-15 DIAGNOSIS — R262 Difficulty in walking, not elsewhere classified: Secondary | ICD-10-CM | POA: Diagnosis not present

## 2014-06-15 DIAGNOSIS — Z96642 Presence of left artificial hip joint: Secondary | ICD-10-CM | POA: Diagnosis present

## 2014-06-15 DIAGNOSIS — M6281 Muscle weakness (generalized): Secondary | ICD-10-CM | POA: Insufficient documentation

## 2014-06-15 DIAGNOSIS — Z471 Aftercare following joint replacement surgery: Secondary | ICD-10-CM | POA: Diagnosis not present

## 2014-06-15 NOTE — Therapy (Signed)
[facility] [address]. [place], [place], [address] Phone: [phone]   Fax:  [phone]  Physical Therapy Treatment  Patient Details  Name: Tracy Bryan MRN: [id number] Date of Birth: May 30, 1960 Referring Provider:  Melrose Nakayama, MD  Encounter Date: 06/15/2014      PT End of Session - 06/15/14 1249    Visit Number 3   Number of Visits 12   Date for PT Re-Evaluation 07/20/14   Authorization Type 3   Authorization Time Period 10   PT Start Time 1102   PT Stop Time 1158   PT Time Calculation (min) 56 min   Activity Tolerance Patient limited by fatigue;No increased pain      Past Medical History  Diagnosis Date  . PONV (postoperative nausea and vomiting)   . Hypertension   . Pneumonia   . Diabetes mellitus without complication     Type 2  . GERD (gastroesophageal reflux disease)   . Arthritis   . Hypercholesteremia   . Disseminated intravascular coagulation 1999    After child birth    Past Surgical History  Procedure Laterality Date  . Hip arthroplasty Right   . Hernia repair      Umblical  . Cervical disc surgery    . Total hip arthroplasty Left 05/18/2014    Procedure: LEFT TOTAL HIP ARTHROPLASTY ANTERIOR APPROACH;  Surgeon: Melrose Nakayama, MD;  Location: [place];  Service: Orthopedics;  Laterality: Left;    There were no vitals filed for this visit.  Visit Diagnosis:  Muscle weakness  Difficulty in walking      Subjective Assessment - 06/15/14 1245    Subjective Pt. reports no pain at this time and entered PT clinic with no assistive device.     Patient is accompained by: Family member   Limitations Walking;Standing   Currently in Pain? No/denies       OBJECTIVE:  Scifit L6 10 min. (warm-up/ no charge).  Standing cone touches/ wt. Shifting forward and lateral with no UE assist.  Resisted gait in //-bars with 2BTB 10x all 4-planes working on upright posture/ consistent step pattern/ hip  strengthening with min. UE assist.  Obstacle course in  Gym with Airex/ cones in narrow spaces/ reaching/ step ups with no assistive device.  Gait training with no assistive device working on step pattern/ heel strike working on preventing antalgic gait/ limp without SPC.  Reassess scar (healing well).  Supine LE hamstring/ hip stretches (8 min)- pain tolerable with static holds.  Reviewed HEP.     Pt response for medical necessity:  Decrease rest breaks with marked improvement noted in gait pattern after PT tx. Session.  Pt. Does not need assistive device with short distance/ level surface ambulation at this time.                  PT Education - 06/15/14 1247    Education provided Yes   Education Details Gait training/ pt. instructed to ambulate with use of SPC if antalgic gait pattern worsens t/o the day.     Person(s) Educated Patient   Methods Demonstration;Explanation;Verbal cues   Comprehension Verbalized understanding             PT Long Term Goals - 06/15/14 1826    PT LONG TERM GOAL #1   Title Pt. will be independent with HEP for prevention/ self-management of hip symptoms/ difficulty   Time 2   Period Weeks   Status Partially Met  PT LONG TERM GOAL #2   Title Pt. will be independent with ambulation with LRAD on unlevel surfaces without LOB and with symmetrical stance time on B LE.   Time 4   Period Weeks   Status Partially Met   PT LONG TERM GOAL #3   Title Pt. will improve L hip flexion, abduction, and ER strength to at least 4/5 MMT to enable all functional activities at home and work.     Time 6   Period Weeks   Status Partially Met   PT LONG TERM GOAL #4   Title Pt. will improve LEFS by >9 points demonstrating reduced self-reported lower extremity disability.   Time 6   Period Weeks   Status New               Plan - 06/15/14 1300    Pt will benefit from skilled therapeutic intervention in order to improve on the following deficits  Decreased endurance   Rehab Potential Good   PT Frequency 2x / week   PT Duration 6 weeks   PT Treatment/Interventions Gait training;Neuromuscular re-education;Scar mobilization;Stair training;Functional mobility training;Patient/family education;Passive range of motion;Therapeutic activities;Therapeutic exercise;Balance training   PT Next Visit Plan progress hip/LE muscle strengthening   PT Home Exercise Plan continue with initial strengthening ex.    Consulted and Agree with Plan of Care Patient        Problem List Patient Active Problem List   Diagnosis Date Noted  . Primary osteoarthritis of left hip 05/18/2014  . Diabetes 05/18/2014    Pura Spice 06/15/2014, 6:32 PM  [facility] [address] [place], [place], [address] Phone: [phone]   Fax:  [phone]

## 2014-06-17 ENCOUNTER — Encounter: Payer: BLUE CROSS/BLUE SHIELD | Admitting: Physical Therapy

## 2014-06-22 ENCOUNTER — Ambulatory Visit: Payer: BLUE CROSS/BLUE SHIELD | Admitting: Physical Therapy

## 2014-06-24 ENCOUNTER — Encounter: Payer: Medicare Other | Admitting: Physical Therapy

## 2014-06-29 ENCOUNTER — Ambulatory Visit: Payer: BLUE CROSS/BLUE SHIELD | Admitting: Physical Therapy

## 2014-06-29 ENCOUNTER — Encounter: Payer: Self-pay | Admitting: Physical Therapy

## 2014-06-29 DIAGNOSIS — M6281 Muscle weakness (generalized): Secondary | ICD-10-CM

## 2014-06-29 DIAGNOSIS — Z471 Aftercare following joint replacement surgery: Secondary | ICD-10-CM | POA: Diagnosis not present

## 2014-06-29 DIAGNOSIS — R262 Difficulty in walking, not elsewhere classified: Secondary | ICD-10-CM

## 2014-06-29 NOTE — Therapy (Signed)
[facility] [address] [place], [place], [address] Phone: [phone]   Fax:  [phone]  Physical Therapy Treatment  Patient Details  Name: Tracy Bryan MRN: [id number] Date of Birth: April 02, 1960 Referring Provider:  Guadalupe Maple, MD  Encounter Date: 06/29/2014      PT End of Session - 06/29/14 2056    Visit Number 4   Number of Visits 12   Date for PT Re-Evaluation 07/20/14   Authorization Type 4   Authorization Time Period 10   PT Start Time 1032   PT Stop Time 1121   PT Time Calculation (min) 49 min   Activity Tolerance Patient limited by fatigue;No increased pain   Behavior During Therapy [facility] for tasks assessed/performed      Past Medical History  Diagnosis Date  . PONV (postoperative nausea and vomiting)   . Hypertension   . Pneumonia   . Diabetes mellitus without complication     Type 2  . GERD (gastroesophageal reflux disease)   . Arthritis   . Hypercholesteremia   . Disseminated intravascular coagulation 1999    After child birth    Past Surgical History  Procedure Laterality Date  . Hip arthroplasty Right   . Hernia repair      Umblical  . Cervical disc surgery    . Total hip arthroplasty Left 05/18/2014    Procedure: LEFT TOTAL HIP ARTHROPLASTY ANTERIOR APPROACH;  Surgeon: Melrose Nakayama, MD;  Location: [place];  Service: Orthopedics;  Laterality: Left;    There were no vitals filed for this visit.  Visit Diagnosis:  Muscle weakness  Difficulty in walking      Subjective Assessment - 06/29/14 2053    Subjective Pt. entered PT clinic wtih no assistive device and no c/o pain.  Pt. states she was out of town last week and did well with L hip.  Pt. states she still has difficulty kneeling/ squating down to ground.     Limitations Walking;Standing   Patient Stated Goals pain-free mobility.    Multiple Pain Sites No      OBJECTIVE: Scifit L6 10 min. (warm-up/ no charge). Standing cone  touches/ wt. Shifting forward and lateral with no UE assist. Airex standing/ SLS with rebounder. Gait training with no assistive device working on step pattern/ heel strike working on preventing antalgic gait/ limp without SPC. Supine LE hamstring/ hip stretches (15 min)- no pain/ great flexibility. Reviewed HEP in depth.    Pt response for medical necessity:Gait training with marked improvement in independent gait pattern with slight L antalgic gait/ able to correct with verbal cuing.  No pain reported t/o tx.  Great L LE stability with higher level tasks/ SLS.          PT Education - 06/29/14 2055    Education provided Yes   Education Details pt. instructed to continue with daily walking/ HEP.  Pt. will be on hold with PT at this time with focus on indepdent HEP (pt. feels she is doing well and independent at this time).     Person(s) Educated Patient   Methods Explanation;Demonstration   Comprehension Verbalized understanding             PT Long Term Goals - 06/29/14 2100    PT LONG TERM GOAL #1   Title Pt. will be independent with HEP for prevention/ self-management of hip symptoms/ difficulty   Time 2   Period Weeks   Status Achieved   PT  LONG TERM GOAL #2   Title Pt. will be independent with ambulation with LRAD on unlevel surfaces without LOB and with symmetrical stance time on B LE.   Time 4   Period Weeks   Status Partially Met   PT LONG TERM GOAL #3   Title Pt. will improve L hip flexion, abduction, and ER strength to at least 4/5 MMT to enable all functional activities at home and work.     Time 6   Period Weeks   Status Achieved   PT LONG TERM GOAL #4   Title Pt. will improve LEFS by >9 points demonstrating reduced self-reported lower extremity disability.   Baseline 60 out of 80 on 5/17   Time 6   Period Weeks   Status Achieved               Plan - 06/29/14 2058    Clinical Impression Statement Pt. has progressed well towards all  PT goals and will continue with indepedent walking/ HEP on a daily basis at this time.  Pt. still ambulates with slight L antalgic gait but able to correct with verbal cuing.  Pt. instructed to contact PT if any problems persist over next couple of weeks.        Pt will benefit from skilled therapeutic intervention in order to improve on the following deficits Decreased endurance;Decreased strength;Decreased mobility;Difficulty walking;Pain;Decreased range of motion   Rehab Potential Good   PT Frequency 2x / week   PT Duration 6 weeks   PT Treatment/Interventions Gait training;Neuromuscular re-education;Scar mobilization;Stair training;Functional mobility training;Patient/family education;Passive range of motion;Therapeutic activities;Therapeutic exercise;Balance training   PT Next Visit Plan pt. on hold with focus on HEP   PT Home Exercise Plan continue with initial strengthening ex.    Consulted and Agree with Plan of Care Patient        Problem List Patient Active Problem List   Diagnosis Date Noted  . Primary osteoarthritis of left hip 05/18/2014  . Diabetes 05/18/2014   Pura Spice, PT, DPT # [id number]   06/29/2014, 9:04 PM  [place] [facility] [address] [place], [place], [address] Phone: [phone]   Fax:  [phone]

## 2014-07-01 ENCOUNTER — Encounter: Payer: Medicare Other | Admitting: Physical Therapy

## 2014-07-23 ENCOUNTER — Ambulatory Visit
Admission: EM | Admit: 2014-07-23 | Discharge: 2014-07-23 | Disposition: A | Discharge status: Home / Self Care | Payer: BLUE CROSS/BLUE SHIELD | Attending: Family Medicine | Admitting: Family Medicine

## 2014-07-23 DIAGNOSIS — S91311A Laceration without foreign body, right foot, initial encounter: Secondary | ICD-10-CM

## 2014-07-23 NOTE — ED Provider Notes (Signed)
CSN: [id number]     Arrival date & time 07/23/14  1327 History   First MD Initiated Contact with Patient 07/23/14 1432     Chief Complaint  Patient presents with  . Foot Injury   (Consider location/radiation/quality/duration/timing/severity/associated sxs/prior Treatment) Patient is a 54 y.o. female presenting with foot injury.  Foot Injury Location:  Foot (cut to right inner heel with the screen door at home) Time since incident:  2 hours Injury: yes   Mechanism of injury comment:  Cut to right inner heel area with the screen door at home; patient states she's up to date on her tetanus vaccine Foot location:  R foot (inner heel) Tetanus status:  Up to date   Past Medical History  Diagnosis Date  . PONV (postoperative nausea and vomiting)   . Hypertension   . Pneumonia   . Diabetes mellitus without complication     Type 2  . GERD (gastroesophageal reflux disease)   . Arthritis   . Hypercholesteremia   . Disseminated intravascular coagulation 1999    After child birth  . Herpes simplex   . Migraines    Past Surgical History  Procedure Laterality Date  . Hip arthroplasty Right   . Cervical disc surgery    . 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 Hernia repair  2000    Umblical  . Spine surgery    . Dilation and curettage of uterus  1996   Family History  Problem Relation Age of Onset  . Hypertension Mother   . Stroke Mother   . Hyperlipidemia Mother   . Heart disease Mother   . Diabetes Father   . Diabetes Sister   . Hyperlipidemia Sister   . Stroke Paternal Grandfather   . Hypertension Paternal Grandfather    History  Substance Use Topics  . Smoking status: Former Smoker    Quit date: 06/14/1984  . Smokeless tobacco: Not on file  . Alcohol Use: No   OB History    No data available     Review of  Systems  Allergies  Benazepril hcl  Home Medications   Prior to Admission medications   Medication Sig Start Date End Date Taking? Authorizing Provider  aspirin EC 325 MG EC tablet Take 1 tablet (325 mg total) by mouth 2 (two) times daily after a meal. 05/20/14  Yes Elodia Florence, PA-C  chlorthalidone (HYGROTON) 25 MG tablet Take 25 mg by mouth daily.   Yes Historical Provider, MD  DULoxetine (CYMBALTA) 60 MG capsule Take 60 mg by mouth 2 (two) times daily.   Yes Historical Provider, MD  estradiol (ESTRACE) 0.5 MG tablet Take 0.5 mg by mouth daily.   Yes Historical Provider, MD  fenofibrate micronized (LOFIBRA) 134 MG capsule Take 134 mg by mouth daily.   Yes Historical Provider, MD  FIBER PO Take 1 tablet by mouth every other day.   Yes Historical Provider, MD  glucose blood test strip 1 each by Other route daily. Use as instructed   Yes Historical Provider, MD  metFORMIN (GLUCOPHAGE) 500 MG tablet Take 500 mg by mouth 2 (two) times daily with a meal.   Yes Historical Provider, MD  metoprolol (LOPRESSOR) 50 MG tablet Take 50 mg by mouth daily.   Yes Historical Provider, MD  Multiple Vitamins-Minerals (MULTIVITAMIN WITH MINERALS) tablet Take 1 tablet by mouth daily.  Yes Historical Provider, MD  POTASSIUM PO Take 1 tablet by mouth daily.   Yes Historical Provider, MD  saxagliptin HCl (ONGLYZA) 5 MG TABS tablet Take 5 mg by mouth daily.   Yes Historical Provider, MD  simvastatin (ZOCOR) 40 MG tablet Take 40 mg by mouth at bedtime.   Yes Historical Provider, MD  CRANBERRY PO Take 4,200 mg by mouth daily.    Historical Provider, MD  HYDROcodone-acetaminophen (NORCO/VICODIN) 5-325 MG per tablet Take 1-2 tablets by mouth every 4 (four) hours as needed (breakthrough pain). 05/20/14   Elodia Florence, PA-C  methocarbamol (ROBAXIN) 500 MG tablet Take 1 tablet (500 mg total) by mouth every 6 (six) hours as needed for muscle spasms. 05/20/14   Elodia Florence, PA-C   BP 124/84 mmHg  Pulse 68  Temp(Src) 98.5 F  (36.9 C) (Tympanic)  Resp 16  Ht 5' (1.524 m)  Wt 132 lb (59.875 kg)  BMI 25.78 kg/m2  SpO2 100% Physical Exam  Constitutional: She appears well-developed and well-nourished. No distress.  Musculoskeletal:  Right foot neurovascularly intact  Skin: She is not diaphoretic.  2cm "V-shaped" superficial laceration to right inner heel area;   Nursing note and vitals reviewed.   ED Course  Procedures (including critical care time) Labs Review Labs Reviewed - No data to display  Imaging Review No results found.   MDM   1. Laceration of foot, right, initial encounter    Wound area cleaned and prepped in sterile fashion; wound examined and no foreign bodies present; wound area anesthetized with 1% lidocaine; 4 interrupted sutures placed with 4.0 Nylon with wound closure; patient tolerated procedure well; wound care instructions given. Follow up in 7 days for suture removal or sooner prn if any problems.     Payton Mccallum, MD 07/23/14 [phone]

## 2014-07-23 NOTE — Discharge Instructions (Signed)
Laceration Care, Adult °A laceration is a cut or lesion that goes through all layers of the skin and into the tissue just beneath the skin. °TREATMENT  °Some lacerations may not require closure. Some lacerations may not be able to be closed due to an increased risk of infection. It is important to see your caregiver as soon as possible after an injury to minimize the risk of infection and maximize the opportunity for successful closure. °If closure is appropriate, pain medicines may be given, if needed. The wound will be cleaned to help prevent infection. Your caregiver will use stitches (sutures), staples, wound glue (adhesive), or skin adhesive strips to repair the laceration. These tools bring the skin edges together to allow for faster healing and a better cosmetic outcome. However, all wounds will heal with a scar. Once the wound has healed, scarring can be minimized by covering the wound with sunscreen during the day for 1 full year. °HOME CARE INSTRUCTIONS  °For sutures or staples: °· Keep the wound clean and dry. °· If you were given a bandage (dressing), you should change it at least once a day. Also, change the dressing if it becomes wet or dirty, or as directed by your caregiver. °· Wash the wound with soap and water 2 times a day. Rinse the wound off with water to remove all soap. Pat the wound dry with a clean towel. °· After cleaning, apply a thin layer of the antibiotic ointment as recommended by your caregiver. This will help prevent infection and keep the dressing from sticking. °· You may shower as usual after the first 24 hours. Do not soak the wound in water until the sutures are removed. °· Only take over-the-counter or prescription medicines for pain, discomfort, or fever as directed by your caregiver. °· Get your sutures or staples removed as directed by your caregiver. °For skin adhesive strips: °· Keep the wound clean and dry. °· Do not get the skin adhesive strips wet. You may bathe  carefully, using caution to keep the wound dry. °· If the wound gets wet, pat it dry with a clean towel. °· Skin adhesive strips will fall off on their own. You may trim the strips as the wound heals. Do not remove skin adhesive strips that are still stuck to the wound. They will fall off in time. °For wound adhesive: °· You may briefly wet your wound in the shower or bath. Do not soak or scrub the wound. Do not swim. Avoid periods of heavy perspiration until the skin adhesive has fallen off on its own. After showering or bathing, gently pat the wound dry with a clean towel. °· Do not apply liquid medicine, cream medicine, or ointment medicine to your wound while the skin adhesive is in place. This may loosen the film before your wound is healed. °· If a dressing is placed over the wound, be careful not to apply tape directly over the skin adhesive. This may cause the adhesive to be pulled off before the wound is healed. °· Avoid prolonged exposure to sunlight or tanning lamps while the skin adhesive is in place. Exposure to ultraviolet light in the first year will darken the scar. °· The skin adhesive will usually remain in place for 5 to 10 days, then naturally fall off the skin. Do not pick at the adhesive film. °You may need a tetanus shot if: °· You cannot remember when you had your last tetanus shot. °· You have never had a tetanus   shot. If you get a tetanus shot, your arm may swell, get red, and feel warm to the touch. This is common and not a problem. If you need a tetanus shot and you choose not to have one, there is a rare chance of getting tetanus. Sickness from tetanus can be serious. SEEK MEDICAL CARE IF:   You have redness, swelling, or increasing pain in the wound.  You see a red line that goes away from the wound.  You have yellowish-white fluid (pus) coming from the wound.  You have a fever.  You notice a bad smell coming from the wound or dressing.  Your wound breaks open before or  after sutures have been removed.  You notice something coming out of the wound such as wood or glass.  Your wound is on your hand or foot and you cannot move a finger or toe. SEEK IMMEDIATE MEDICAL CARE IF:   Your pain is not controlled with prescribed medicine.  You have severe swelling around the wound causing pain and numbness or a change in color in your arm, hand, leg, or foot.  Your wound splits open and starts bleeding.  You have worsening numbness, weakness, or loss of function of any joint around or beyond the wound.  You develop painful lumps near the wound or on the skin anywhere on your body. MAKE SURE YOU:   Understand these instructions.  Will watch your condition.  Will get help right away if you are not doing well or get worse. Document Released: 01/29/2005 Document Revised: 04/23/2011 Document Reviewed: 07/25/2010 [facility] Patient Information 2015 [place], [place]. This information is not intended to replace advice given to you by your health care provider. Make sure you discuss any questions you have with your health care provider.  Follow up 1 week for suture removal

## 2014-07-23 NOTE — ED Notes (Signed)
Small laceration noted on right inner heel. No active bleeding. Cleaned with NS and Cholohexidine soap. Pt states Tetanus is up to date.

## 2014-07-23 NOTE — ED Notes (Signed)
Pt states "I was going into my house and the screen door closed on my right foot/heel. I'm not sure if I need stiches."

## 2014-07-28 ENCOUNTER — Ambulatory Visit (INDEPENDENT_AMBULATORY_CARE_PROVIDER_SITE_OTHER): Payer: Medicare Other | Admitting: Unknown Physician Specialty

## 2014-07-28 ENCOUNTER — Other Ambulatory Visit: Payer: Self-pay | Admitting: Unknown Physician Specialty

## 2014-07-28 ENCOUNTER — Encounter: Payer: Self-pay | Admitting: Unknown Physician Specialty

## 2014-07-28 DIAGNOSIS — E7800 Pure hypercholesterolemia, unspecified: Secondary | ICD-10-CM

## 2014-07-28 DIAGNOSIS — I129 Hypertensive chronic kidney disease with stage 1 through stage 4 chronic kidney disease, or unspecified chronic kidney disease: Secondary | ICD-10-CM

## 2014-07-28 DIAGNOSIS — Z0000 Encounter for general adult medical examination without abnormal findings: Secondary | ICD-10-CM

## 2014-07-28 DIAGNOSIS — N189 Chronic kidney disease, unspecified: Secondary | ICD-10-CM | POA: Diagnosis not present

## 2014-07-28 DIAGNOSIS — I10 Essential (primary) hypertension: Secondary | ICD-10-CM | POA: Insufficient documentation

## 2014-07-28 DIAGNOSIS — E1122 Type 2 diabetes mellitus with diabetic chronic kidney disease: Secondary | ICD-10-CM

## 2014-07-28 DIAGNOSIS — E785 Hyperlipidemia, unspecified: Secondary | ICD-10-CM

## 2014-07-28 DIAGNOSIS — E1169 Type 2 diabetes mellitus with other specified complication: Secondary | ICD-10-CM | POA: Insufficient documentation

## 2014-07-28 MED ORDER — ESTRADIOL 0.5 MG PO TABS: 0.5000 mg | ORAL_TABLET | Freq: Every day | ORAL | Status: DC

## 2014-07-28 MED ORDER — SITAGLIPTIN PHOSPHATE 100 MG PO TABS: 100.0000 mg | ORAL_TABLET | Freq: Every day | ORAL | Status: DC

## 2014-07-28 MED ORDER — SIMVASTATIN 40 MG PO TABS: 40.0000 mg | ORAL_TABLET | Freq: Every day | ORAL | Status: DC

## 2014-07-28 MED ORDER — FENOFIBRATE MICRONIZED 134 MG PO CAPS: 134.0000 mg | ORAL_CAPSULE | Freq: Every day | ORAL | Status: DC

## 2014-07-28 MED ORDER — METFORMIN HCL 500 MG PO TABS: 500.0000 mg | ORAL_TABLET | Freq: Two times a day (BID) | ORAL | Status: DC

## 2014-07-28 MED ORDER — METOPROLOL TARTRATE 50 MG PO TABS: 50.0000 mg | ORAL_TABLET | Freq: Every day | ORAL | Status: DC

## 2014-07-28 NOTE — Assessment & Plan Note (Addendum)
Hgb A1C showing good control.  Switch from Onglyza to Januvia due to insurance coveage

## 2014-07-28 NOTE — Progress Notes (Signed)
BP 113/71 mmHg  Pulse 78  Temp(Src) 98.3 F (36.8 C)  Ht 5' 0.1" (1.527 m)  Wt 130 lb 6.4 oz (59.149 kg)  BMI 25.37 kg/m2  SpO2 98%  LMP  (LMP Unknown)   Subjective:    Patient ID: Tracy Bryan, female    DOB: February 14, 1960, 54 y.o.   MRN: [id number]  HPI: Tracy Bryan is a 54 y.o. female  Chief Complaint  Patient presents with  . Annual Exam    Relevant past medical, surgical, family and social history reviewed and updated as indicated. Interim medical history since our last visit reviewed. Allergies and medications reviewed and updated.  Diabetes She presents for her follow-up diabetic visit. She has type 2 diabetes mellitus. Her disease course has been stable. There are no hypoglycemic associated symptoms. Pertinent negatives for diabetes include no blurred vision, no chest pain, no fatigue, no foot paresthesias, no visual change and no weakness. There are no hypoglycemic complications. Current diabetic treatment includes oral agent (monotherapy). She is compliant with treatment all of the time. Her weight is stable. She is following a generally healthy diet. She participates in exercise daily. She monitors blood glucose at home 1-2 x per day. Her overall blood glucose range is 110-130 mg/dl. Eye exam is current.  Hypertension This is a chronic problem. The problem is controlled. Pertinent negatives include no blurred vision, chest pain or shortness of breath. There are no associated agents to hypertension.  Hyperlipidemia This is a new problem. The problem is controlled. Pertinent negatives include no chest pain or shortness of breath. Current antihyperlipidemic treatment includes statins. There are no compliance problems.      Review of Systems  Constitutional: Negative.  Negative for fatigue.  HENT: Negative.   Eyes: Negative.  Negative for blurred vision.  Respiratory: Negative.  Negative for shortness of breath.   Cardiovascular: Negative.  Negative for  chest pain.  Gastrointestinal: Negative.   Endocrine: Negative.   Genitourinary: Negative.   Musculoskeletal: Negative.   Skin: Negative.   Allergic/Immunologic: Negative.   Neurological: Negative.  Negative for weakness.  Hematological: Negative.   Psychiatric/Behavioral: Negative.     Per HPI unless specifically indicated above     Objective:    BP 113/71 mmHg  Pulse 78  Temp(Src) 98.3 F (36.8 C)  Ht 5' 0.1" (1.527 m)  Wt 130 lb 6.4 oz (59.149 kg)  BMI 25.37 kg/m2  SpO2 98%  LMP  (LMP Unknown)  Wt Readings from Last 3 Encounters:  07/28/14 130 lb 6.4 oz (59.149 kg)  07/23/14 132 lb (59.875 kg)  29-Sep-2058 130 lb (58.968 kg) (100 %*, Z = 37.47)   * Growth percentiles are based on WHO (Girls, 0-2 years) data.    Physical Exam  Constitutional: She is oriented to person, place, and time. She appears well-developed and well-nourished.  HENT:  Head: Normocephalic and atraumatic.  Eyes: Pupils are equal, round, and reactive to light. Right eye exhibits no discharge. Left eye exhibits no discharge. No scleral icterus.  Neck: Normal range of motion. Neck supple. Carotid bruit is not present. No thyromegaly present.  Cardiovascular: Normal rate, regular rhythm and normal heart sounds.  Exam reveals no gallop and no friction rub.   No murmur heard. Pulmonary/Chest: Effort normal and breath sounds normal. No respiratory distress. She has no wheezes. She has no rales.  Abdominal: Soft. Bowel sounds are normal. There is no tenderness. There is no rebound.  Genitourinary: No breast swelling, tenderness or discharge.  Musculoskeletal:  Normal range of motion.  Lymphadenopathy:    She has no cervical adenopathy.  Neurological: She is alert and oriented to person, place, and time.  Skin: Skin is warm, dry and intact. No rash noted.  Psychiatric: She has a normal mood and affect. Her speech is normal and behavior is normal. Judgment and thought content normal. Cognition and memory are  normal.        Assessment & Plan:   Problem List Items Addressed This Visit      Cardiovascular and Mediastinum   Parenchymal renal hypertension     Endocrine   Diabetes (Chronic)    Hgb A1C showing good control        Other   Hypercholesteremia    Non fasting.  Sending labs out      Routine general medical examination at a health care facility   Relevant Orders   Mammogram Digital Screening       Follow up plan: No Follow-up on file.

## 2014-07-28 NOTE — Patient Instructions (Signed)
Diabetes and Exercise Exercising regularly is important. It is not just about losing weight. It has many health benefits, such as:  Improving your overall fitness, flexibility, and endurance.  Increasing your bone density.  Helping with weight control.  Decreasing your body fat.  Increasing your muscle strength.  Reducing stress and tension.  Improving your overall health. People with diabetes who exercise gain additional benefits because exercise:  Reduces appetite.  Improves the body's use of blood sugar (glucose).  Helps lower or control blood glucose.  Decreases blood pressure.  Helps control blood lipids (such as cholesterol and triglycerides).  Improves the body's use of the hormone insulin by:  Increasing the body's insulin sensitivity.  Reducing the body's insulin needs.  Decreases the risk for heart disease because exercising:  Lowers cholesterol and triglycerides levels.  Increases the levels of good cholesterol (such as high-density lipoproteins [HDL]) in the body.  Lowers blood glucose levels. YOUR ACTIVITY PLAN  Choose an activity that you enjoy and set realistic goals. Your health care provider or diabetes educator can help you make an activity plan that works for you. Exercise regularly as directed by your health care provider. This includes:  Performing resistance training twice a week such as push-ups, sit-ups, lifting weights, or using resistance bands.  Performing 150 minutes of cardio exercises each week such as walking, running, or playing sports.  Staying active and spending no more than 90 minutes at one time being inactive. Even short bursts of exercise are good for you. Three 10-minute sessions spread throughout the day are just as beneficial as a single 30-minute session. Some exercise ideas include:  Taking the dog for a walk.  Taking the stairs instead of the elevator.  Dancing to your favorite song.  Doing an exercise  video.  Doing your favorite exercise with a friend. RECOMMENDATIONS FOR EXERCISING WITH TYPE 1 OR TYPE 2 DIABETES   Check your blood glucose before exercising. If blood glucose levels are greater than 240 mg/dL, check for urine ketones. Do not exercise if ketones are present.  Avoid injecting insulin into areas of the body that are going to be exercised. For example, avoid injecting insulin into:  The arms when playing tennis.  The legs when jogging.  Keep a record of:  Food intake before and after you exercise.  Expected peak times of insulin action.  Blood glucose levels before and after you exercise.  The type and amount of exercise you have done.  Review your records with your health care provider. Your health care provider will help you to develop guidelines for adjusting food intake and insulin amounts before and after exercising.  If you take insulin or oral hypoglycemic agents, watch for signs and symptoms of hypoglycemia. They include:  Dizziness.  Shaking.  Sweating.  Chills.  Confusion.  Drink plenty of water while you exercise to prevent dehydration or heat stroke. Body water is lost during exercise and must be replaced.  Talk to your health care provider before starting an exercise program to make sure it is safe for you. Remember, almost any type of activity is better than none. Document Released: 04/21/2003 Document Revised: 06/15/2013 Document Reviewed: 07/08/2012 [employer] Patient Information 2015 [employer]. This information is not intended to replace advice given to you by your health care provider. Make sure you discuss any questions you have with your health care provider. Diabetes Mellitus and Food It is important for you to manage your blood sugar (glucose) level. Your blood glucose level can   be greatly affected by what you eat. Eating healthier foods in the appropriate amounts throughout the day at about the same time each day will help you  control your blood glucose level. It can also help slow or prevent worsening of your diabetes mellitus. Healthy eating may even help you improve the level of your blood pressure and reach or maintain a healthy weight.  HOW CAN FOOD AFFECT ME? Carbohydrates Carbohydrates affect your blood glucose level more than any other type of food. Your dietitian will help you determine how many carbohydrates to eat at each meal and teach you how to count carbohydrates. Counting carbohydrates is important to keep your blood glucose at a healthy level, especially if you are using insulin or taking certain medicines for diabetes mellitus. Alcohol Alcohol can cause sudden decreases in blood glucose (hypoglycemia), especially if you use insulin or take certain medicines for diabetes mellitus. Hypoglycemia can be a life-threatening condition. Symptoms of hypoglycemia (sleepiness, dizziness, and disorientation) are similar to symptoms of having too much alcohol.  If your health care provider has given you approval to drink alcohol, do so in moderation and use the following guidelines:  Women should not have more than one drink per day, and men should not have more than two drinks per day. One drink is equal to:  12 oz of beer.  5 oz of wine.  1 oz of hard liquor.  Do not drink on an empty stomach.  Keep yourself hydrated. Have water, diet soda, or unsweetened iced tea.  Regular soda, juice, and other mixers might contain a lot of carbohydrates and should be counted. WHAT FOODS ARE NOT RECOMMENDED? As you make food choices, it is important to remember that all foods are not the same. Some foods have fewer nutrients per serving than other foods, even though they might have the same number of calories or carbohydrates. It is difficult to get your body what it needs when you eat foods with fewer nutrients. Examples of foods that you should avoid that are high in calories and carbohydrates but low in nutrients  include:  Trans fats (most processed foods list trans fats on the Nutrition Facts label).  Regular soda.  Juice.  Candy.  Sweets, such as cake, pie, doughnuts, and cookies.  Fried foods. WHAT FOODS CAN I EAT? Have nutrient-rich foods, which will nourish your body and keep you healthy. The food you should eat also will depend on several factors, including:  The calories you need.  The medicines you take.  Your weight.  Your blood glucose level.  Your blood pressure level.  Your cholesterol level. You also should eat a variety of foods, including:  Protein, such as meat, poultry, fish, tofu, nuts, and seeds (lean animal proteins are best).  Fruits.  Vegetables.  Dairy products, such as milk, cheese, and yogurt (low fat is best).  Breads, grains, pasta, cereal, rice, and beans.  Fats such as olive oil, trans fat-free margarine, canola oil, avocado, and olives. DOES EVERYONE WITH DIABETES MELLITUS HAVE THE SAME MEAL PLAN? Because every person with diabetes mellitus is different, there is not one meal plan that works for everyone. It is very important that you meet with a dietitian who will help you create a meal plan that is just right for you. Document Released: 10/26/2004 Document Revised: 02/03/2013 Document Reviewed: 12/26/2012 [employer] Patient Information 2015 [employer]. This information is not intended to replace advice given to you by your health care provider. Make sure you discuss any   questions you have with your health care provider.  

## 2014-07-28 NOTE — Assessment & Plan Note (Signed)
Non fasting.  Sending labs out

## 2014-07-29 LAB — LIPID PANEL PICCOLO, WAIVED

## 2014-07-29 LAB — COMPREHENSIVE METABOLIC PANEL
ALT: 17 IU/L (ref 0–32)
AST: 19 IU/L (ref 0–40)
Albumin/Globulin Ratio: 2 (ref 1.1–2.5)
Albumin: 4.5 g/dL (ref 3.5–5.5)
Alkaline Phosphatase: 38 IU/L — ABNORMAL LOW (ref 39–117)
BUN/Creatinine Ratio: 19 (ref 9–23)
BUN: 21 mg/dL (ref 6–24)
CALCIUM: 10.2 mg/dL (ref 8.7–10.2)
CHLORIDE: 97 mmol/L (ref 97–108)
CO2: 22 mmol/L (ref 18–29)
Creatinine, Ser: 1.12 mg/dL — ABNORMAL HIGH (ref 0.57–1.00)
GFR calc non Af Amer: 55 mL/min/{1.73_m2} — ABNORMAL LOW (ref >59)
GFR, EST AFRICAN AMERICAN: 63 mL/min/{1.73_m2} (ref >59)
GLUCOSE: 185 mg/dL — AB (ref 65–99)
Globulin, Total: 2.2 g/dL (ref 1.5–4.5)
POTASSIUM: 4 mmol/L (ref 3.5–5.2)
Sodium: 139 mmol/L (ref 134–144)
Total Protein: 6.7 g/dL (ref 6.0–8.5)

## 2014-07-29 LAB — CBC

## 2014-07-29 LAB — LIPID PANEL W/O CHOL/HDL RATIO
CHOLESTEROL TOTAL: 155 mg/dL (ref 100–199)
HDL: 34 mg/dL — ABNORMAL LOW (ref >39)
Triglycerides: 460 mg/dL — ABNORMAL HIGH (ref 0–149)

## 2014-07-29 LAB — URIC ACID: Uric Acid: 6.2 mg/dL (ref 2.5–7.1)

## 2014-07-29 LAB — TSH: TSH: 1.44 u[IU]/mL (ref 0.450–4.500)

## 2014-07-30 ENCOUNTER — Ambulatory Visit
Admission: EM | Admit: 2014-07-30 | Discharge: 2014-07-30 | Disposition: A | Discharge status: Home / Self Care | Payer: BLUE CROSS/BLUE SHIELD | Attending: Family Medicine | Admitting: Family Medicine

## 2014-07-30 ENCOUNTER — Encounter: Payer: Self-pay | Admitting: Emergency Medicine

## 2014-07-30 DIAGNOSIS — IMO0002 Reserved for concepts with insufficient information to code with codable children: Secondary | ICD-10-CM

## 2014-07-30 DIAGNOSIS — Z4802 Encounter for removal of sutures: Secondary | ICD-10-CM

## 2014-07-30 DIAGNOSIS — L03115 Cellulitis of right lower limb: Secondary | ICD-10-CM | POA: Diagnosis not present

## 2014-07-30 LAB — MICROALBUMIN, URINE WAIVED
Creatinine, Urine Waived: 200 mg/dL (ref 10–300)
Microalb, Ur Waived: 10 mg/L (ref 0–19)
Microalb/Creat Ratio: <30 mg/g (ref <30)

## 2014-07-30 LAB — BAYER DCA HB A1C WAIVED: HB A1C (BAYER DCA - WAIVED): 6.9 % (ref <7.0)

## 2014-07-30 MED ORDER — SULFAMETHOXAZOLE-TRIMETHOPRIM 800-160 MG PO TABS: 1.0000 | ORAL_TABLET | Freq: Two times a day (BID) | ORAL | Status: DC

## 2014-07-30 MED ORDER — CEFTRIAXONE SODIUM 1 G IJ SOLR
1.0000 g | Freq: Once | INTRAMUSCULAR | Status: AC
  Administered 2014-07-30: 1 g via INTRAMUSCULAR

## 2014-07-30 NOTE — ED Provider Notes (Signed)
CSN: [id number]     Arrival date & time 07/30/14  0702 History   First MD Initiated Contact with Patient 07/30/14 [phone]     Chief Complaint  Patient presents with  . Suture / Staple Removal   (Consider location/radiation/quality/duration/timing/severity/associated sxs/prior Treatment) HPI Comments: 54 yo female, diabetic, recently seen one week ago for right foot laceration. Had sutures placed. States she's noticed some redness and drainage from the wound and slight tenderness. Denies any fevers, chills.   Patient is a 54 y.o. female presenting with suture removal.  Suture / Staple Removal    Past Medical History  Diagnosis Date  . PONV (postoperative nausea and vomiting)   . Hypertension   . Pneumonia   . Diabetes mellitus without complication     Type 2  . GERD (gastroesophageal reflux disease)   . Arthritis   . Hypercholesteremia   . Disseminated intravascular coagulation 1999    After child birth  . Herpes simplex   . Migraines    Past Surgical History  Procedure Laterality Date  . Hip arthroplasty Right   . Cervical disc surgery    . 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 Hernia repair  2000    [facility]  . Spine surgery    . Dilation and curettage of uterus  1996   Family History  Problem Relation Age of Onset  . Hypertension Mother   . Stroke Mother   . Hyperlipidemia Mother   . Heart disease Mother     heart attack  . Diabetes Father   . Cancer Father     lung  . Diabetes Sister   . Hyperlipidemia Sister   . Stroke Paternal Grandfather   . Hypertension Paternal Grandfather   . Stroke Maternal Grandfather   . Alzheimer's disease Paternal Grandmother   . Diabetes Sister   . Cancer Sister     breast   History  Substance Use Topics  . Smoking status: Former Smoker    Quit date: 06/14/1984  . Smokeless  tobacco: Never Used  . Alcohol Use: No   OB History    No data available     Review of Systems  Allergies  Benazepril hcl  Home Medications   Prior to Admission medications   Medication Sig Start Date End Date Taking? Authorizing Provider  amoxicillin (AMOXIL) 500 MG capsule TAKE 4 CAPSULES BY MOUTH 1 HOUR BEFORE DENTAL APPT 07/18/14   Historical Provider, MD  aspirin EC 325 MG EC tablet Take 1 tablet (325 mg total) by mouth 2 (two) times daily after a meal. 05/20/14   Elodia Florence, PA-C  chlorthalidone (HYGROTON) 25 MG tablet Take 25 mg by mouth daily.    Historical Provider, MD  CRANBERRY PO Take 4,200 mg by mouth daily.    Historical Provider, MD  DULoxetine (CYMBALTA) 60 MG capsule Take 60 mg by mouth 2 (two) times daily.    Historical Provider, MD  estradiol (ESTRACE) 0.5 MG tablet Take 1 tablet (0.5 mg total) by mouth daily. 07/28/14   Gabriel Cirri, NP  fenofibrate micronized (LOFIBRA) 134 MG capsule Take 1 capsule (134 mg total) by mouth daily. 07/28/14   Gabriel Cirri, NP  FIBER PO Take 1 tablet by mouth every other day.    Historical Provider, MD  glucose blood test strip 1 each by Other route daily. Use as instructed  Historical Provider, MD  HYDROcodone-acetaminophen (NORCO/VICODIN) 5-325 MG per tablet Take 1-2 tablets by mouth every 4 (four) hours as needed (breakthrough pain). 05/20/14   Elodia Florence, PA-C  metFORMIN (GLUCOPHAGE) 500 MG tablet Take 1 tablet (500 mg total) by mouth 2 (two) times daily with a meal. 07/28/14   Gabriel Cirri, NP  methocarbamol (ROBAXIN) 500 MG tablet Take 1 tablet (500 mg total) by mouth every 6 (six) hours as needed for muscle spasms. 05/20/14   Elodia Florence, PA-C  metoprolol (LOPRESSOR) 50 MG tablet Take 1 tablet (50 mg total) by mouth daily. 07/28/14   Gabriel Cirri, NP  Multiple Vitamins-Minerals (MULTIVITAMIN WITH MINERALS) tablet Take 1 tablet by mouth daily.    Historical Provider, MD  POTASSIUM PO Take 1 tablet by mouth daily.    Historical  Provider, MD  simvastatin (ZOCOR) 40 MG tablet Take 1 tablet (40 mg total) by mouth at bedtime. 07/28/14   Gabriel Cirri, NP  sitaGLIPtin (JANUVIA) 100 MG tablet Take 1 tablet (100 mg total) by mouth daily. 07/28/14   Gabriel Cirri, NP  sulfamethoxazole-trimethoprim (BACTRIM DS,SEPTRA DS) 800-160 MG per tablet Take 1 tablet by mouth 2 (two) times daily. 07/30/14   Payton Mccallum, MD   BP 124/78 mmHg  Pulse 77  Temp(Src) 97.6 F (36.4 C) (Tympanic)  Resp 16  Ht 5' (1.524 m)  Wt 130 lb (58.968 kg)  BMI 25.39 kg/m2  SpO2 100%  LMP  (LMP Unknown) Physical Exam  Constitutional: She appears well-developed and well-nourished. No distress.  Skin: She is not diaphoretic.  Right inner lateral heel area with sutures in place; slight/mild purulent drainage, tenderness and mild surrounding erythema  Vitals reviewed.   ED Course  Procedures (including critical care time) Labs Review Labs Reviewed - No data to display  Imaging Review No results found.   MDM   1. Laceration   2. Cellulitis of foot, right    New Prescriptions   SULFAMETHOXAZOLE-TRIMETHOPRIM (BACTRIM DS,SEPTRA DS) 800-160 MG PER TABLET    Take 1 tablet by mouth 2 (two) times daily.  Plan: 1. diagnosis reviewed with patient 2. rx as per orders; risks, benefits, potential side effects reviewed with patient 3. Patient given Rocephin 1gm IM x 1 4. Recommend supportive treatment with warm compresses to area and elevation of extremity  5. Will defer suture removal at this time  6. F/u in 3 days (Monday) for re-evaluation and suture removal OR prn over the weekend if symptoms worsen or are not improving  Mickie Hillier, MD 07/30/14 [phone]

## 2014-07-30 NOTE — ED Notes (Signed)
Patient here for suture removal.

## 2014-08-02 ENCOUNTER — Ambulatory Visit
Admission: EM | Admit: 2014-08-02 | Discharge: 2014-08-02 | Disposition: A | Discharge status: Home / Self Care | Payer: BLUE CROSS/BLUE SHIELD | Attending: Family Medicine | Admitting: Family Medicine

## 2014-08-02 DIAGNOSIS — S91311D Laceration without foreign body, right foot, subsequent encounter: Secondary | ICD-10-CM

## 2014-08-02 DIAGNOSIS — Z4802 Encounter for removal of sutures: Secondary | ICD-10-CM

## 2014-08-02 DIAGNOSIS — L03115 Cellulitis of right lower limb: Secondary | ICD-10-CM

## 2014-08-02 NOTE — ED Provider Notes (Signed)
CSN: [id number]     Arrival date & time 08/02/14  1209 History   First MD Initiated Contact with Patient 08/02/14 1242     Chief Complaint  Patient presents with  . Wound Recheck    (Consider location/radiation/quality/duration/timing/severity/associated sxs/prior Treatment) HPI Comments: 54 yo female with history of right foot back/heel laceration 10 days ago here for follow up. Seen last week, 3 days ago, for follow up and suture removal; noted to have mild cellulitis. Given antibiotic IM and started on oral antibiotic which she's still taking. States doing better. Redness, swelling and pain almost completely resolved; no drainage; denies fevers, chills.    Past Medical History  Diagnosis Date  . PONV (postoperative nausea and vomiting)   . Hypertension   . Pneumonia   . Diabetes mellitus without complication     Type 2  . GERD (gastroesophageal reflux disease)   . Arthritis   . Hypercholesteremia   . Disseminated intravascular coagulation 1999    After child birth  . Herpes simplex   . Migraines    Past Surgical History  Procedure Laterality Date  . Hip arthroplasty Right   . Cervical disc surgery    . 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 Hernia repair  2000    Umblical  . Spine surgery    . Dilation and curettage of uterus  1996   Family History  Problem Relation Age of Onset  . Hypertension Mother   . Stroke Mother   . Hyperlipidemia Mother   . Heart disease Mother     heart attack  . Diabetes Father   . Cancer Father     lung  . Diabetes Sister   . Hyperlipidemia Sister   . Stroke Paternal Grandfather   . Hypertension Paternal Grandfather   . Stroke Maternal Grandfather   . Alzheimer's disease Paternal Grandmother   . Diabetes Sister   . Cancer Sister     breast   History  Substance Use Topics  .  Smoking status: Former Smoker    Quit date: 06/14/1984  . Smokeless tobacco: Never Used  . Alcohol Use: No   OB History    No data available     Review of Systems  Allergies  Benazepril hcl  Home Medications   Prior to Admission medications   Medication Sig Start Date End Date Taking? Authorizing Provider  amoxicillin (AMOXIL) 500 MG capsule TAKE 4 CAPSULES BY MOUTH 1 HOUR BEFORE DENTAL APPT 07/18/14   Historical Provider, MD  aspirin EC 325 MG EC tablet Take 1 tablet (325 mg total) by mouth 2 (two) times daily after a meal. 05/20/14   Elodia Florence, PA-C  chlorthalidone (HYGROTON) 25 MG tablet Take 25 mg by mouth daily.    Historical Provider, MD  CRANBERRY PO Take 4,200 mg by mouth daily.    Historical Provider, MD  DULoxetine (CYMBALTA) 60 MG capsule Take 60 mg by mouth 2 (two) times daily.    Historical Provider, MD  estradiol (ESTRACE) 0.5 MG tablet Take 1 tablet (0.5 mg total) by mouth daily. 07/28/14   Gabriel Cirri, NP  fenofibrate micronized (LOFIBRA) 134 MG capsule Take 1 capsule (134 mg total) by mouth daily. 07/28/14   Gabriel Cirri, NP  FIBER PO Take 1 tablet by mouth every other day.    Historical Provider, MD  glucose blood test  strip 1 each by Other route daily. Use as instructed    Historical Provider, MD  HYDROcodone-acetaminophen (NORCO/VICODIN) 5-325 MG per tablet Take 1-2 tablets by mouth every 4 (four) hours as needed (breakthrough pain). 05/20/14   Elodia Florence, PA-C  metFORMIN (GLUCOPHAGE) 500 MG tablet Take 1 tablet (500 mg total) by mouth 2 (two) times daily with a meal. 07/28/14   Gabriel Cirri, NP  methocarbamol (ROBAXIN) 500 MG tablet Take 1 tablet (500 mg total) by mouth every 6 (six) hours as needed for muscle spasms. 05/20/14   Elodia Florence, PA-C  metoprolol (LOPRESSOR) 50 MG tablet Take 1 tablet (50 mg total) by mouth daily. 07/28/14   Gabriel Cirri, NP  Multiple Vitamins-Minerals (MULTIVITAMIN WITH MINERALS) tablet Take 1 tablet by mouth daily.    Historical Provider,  MD  POTASSIUM PO Take 1 tablet by mouth daily.    Historical Provider, MD  simvastatin (ZOCOR) 40 MG tablet Take 1 tablet (40 mg total) by mouth at bedtime. 07/28/14   Gabriel Cirri, NP  sitaGLIPtin (JANUVIA) 100 MG tablet Take 1 tablet (100 mg total) by mouth daily. 07/28/14   Gabriel Cirri, NP  sulfamethoxazole-trimethoprim (BACTRIM DS,SEPTRA DS) 800-160 MG per tablet Take 1 tablet by mouth 2 (two) times daily. 07/30/14   Payton Mccallum, MD   BP 118/69 mmHg  Pulse 68  Temp(Src) 97.8 F (36.6 C) (Tympanic)  Resp 16  SpO2 98%  LMP  (LMP Unknown) Physical Exam  Constitutional: She appears well-developed and well-nourished. No distress.  Skin: Skin is warm. She is not diaphoretic.  Right foot wound: with no surrounding erythema or drainage; sutures in placed; no tenderness  Nursing note and vitals reviewed.   ED Course  Procedures (including critical care time) Labs Review Labs Reviewed - No data to display  Imaging Review No results found.   MDM   1. Cellulitis of foot, right   2. Laceration of foot, right, subsequent encounter    (cellulitis almost completely resolved; improved)  Plan: 1.  diagnosis reviewed with patient 2.  4 interrupted sutures removed without complication 3. Recommend continue local wound care and finish current oral antibiotic 4. F/u prn if symptoms worsen or new symptoms develop   Payton Mccallum, MD 08/02/14 1323

## 2014-08-02 NOTE — ED Notes (Signed)
Was told to return today for wound evaluation prior to suture removal. Sutures were put in on 06/10 for laceration.

## 2014-08-17 ENCOUNTER — Other Ambulatory Visit: Payer: Self-pay

## 2014-08-17 MED ORDER — CHLORTHALIDONE 25 MG PO TABS: 25.0000 mg | ORAL_TABLET | Freq: Every day | ORAL | Status: DC

## 2014-09-06 ENCOUNTER — Telehealth: Payer: Self-pay | Admitting: Unknown Physician Specialty

## 2014-09-06 NOTE — Telephone Encounter (Signed)
Routing to provider. I called [facility] because Dr. Laural Benes refilled this medication on 08/17/14 with 6 refills. The pharmacy stated that they never received this prescription so it just needs to be resent.

## 2014-09-06 NOTE — Telephone Encounter (Signed)
I don't see Chlorthalidone in her among her prescribptions.  Are you sure this is what she needs?

## 2014-09-06 NOTE — Telephone Encounter (Signed)
Called and left patient a voicemail to return my call so I could ask her about her medication.

## 2014-09-06 NOTE — Telephone Encounter (Signed)
[facility] in [place] called stated pt needs refills Chlorthalidon. Thanks.

## 2014-09-07 NOTE — Telephone Encounter (Signed)
Tried to call patient but left a voicemail for her to return my call.

## 2014-09-09 ENCOUNTER — Ambulatory Visit
Admission: EM | Admit: 2014-09-09 | Discharge: 2014-09-09 | Disposition: A | Discharge status: Home / Self Care | Payer: BLUE CROSS/BLUE SHIELD | Attending: Internal Medicine | Admitting: Internal Medicine

## 2014-09-09 DIAGNOSIS — R197 Diarrhea, unspecified: Secondary | ICD-10-CM | POA: Diagnosis not present

## 2014-09-09 DIAGNOSIS — E780 Pure hypercholesterolemia: Secondary | ICD-10-CM | POA: Insufficient documentation

## 2014-09-09 DIAGNOSIS — K219 Gastro-esophageal reflux disease without esophagitis: Secondary | ICD-10-CM | POA: Insufficient documentation

## 2014-09-09 DIAGNOSIS — Z79899 Other long term (current) drug therapy: Secondary | ICD-10-CM | POA: Insufficient documentation

## 2014-09-09 DIAGNOSIS — E119 Type 2 diabetes mellitus without complications: Secondary | ICD-10-CM | POA: Insufficient documentation

## 2014-09-09 DIAGNOSIS — Z87891 Personal history of nicotine dependence: Secondary | ICD-10-CM | POA: Insufficient documentation

## 2014-09-09 DIAGNOSIS — Z7982 Long term (current) use of aspirin: Secondary | ICD-10-CM | POA: Diagnosis not present

## 2014-09-09 DIAGNOSIS — R52 Pain, unspecified: Secondary | ICD-10-CM | POA: Diagnosis present

## 2014-09-09 DIAGNOSIS — I10 Essential (primary) hypertension: Secondary | ICD-10-CM | POA: Diagnosis not present

## 2014-09-09 DIAGNOSIS — R509 Fever, unspecified: Secondary | ICD-10-CM | POA: Diagnosis present

## 2014-09-09 LAB — COMPREHENSIVE METABOLIC PANEL
ALT: 19 U/L (ref 14–54)
ANION GAP: 16 — AB (ref 5–15)
AST: 22 U/L (ref 15–41)
Albumin: 4.5 g/dL (ref 3.5–5.0)
Alkaline Phosphatase: 55 U/L (ref 38–126)
BUN: 21 mg/dL — AB (ref 6–20)
CALCIUM: 9.5 mg/dL (ref 8.9–10.3)
CHLORIDE: 96 mmol/L — AB (ref 101–111)
CO2: 22 mmol/L (ref 22–32)
CREATININE: 1.05 mg/dL — AB (ref 0.44–1.00)
GFR calc non Af Amer: 58 mL/min — ABNORMAL LOW (ref >60)
Glucose, Bld: 199 mg/dL — ABNORMAL HIGH (ref 65–99)
Potassium: 3.2 mmol/L — ABNORMAL LOW (ref 3.5–5.1)
Sodium: 134 mmol/L — ABNORMAL LOW (ref 135–145)
Total Bilirubin: 0.3 mg/dL (ref 0.3–1.2)
Total Protein: 8.7 g/dL — ABNORMAL HIGH (ref 6.5–8.1)

## 2014-09-09 LAB — CBC WITH DIFFERENTIAL/PLATELET
Basophils Absolute: 0.1 10*3/uL (ref 0–0.1)
Basophils Relative: 1 %
EOS ABS: 0.1 10*3/uL (ref 0–0.7)
EOS PCT: 1 %
HEMATOCRIT: 41.8 % (ref 35.0–47.0)
Hemoglobin: 13.8 g/dL (ref 12.0–16.0)
Lymphocytes Relative: 15 %
Lymphs Abs: 1.6 10*3/uL (ref 1.0–3.6)
MCH: 25.3 pg — ABNORMAL LOW (ref 26.0–34.0)
MCHC: 33 g/dL (ref 32.0–36.0)
MCV: 76.7 fL — ABNORMAL LOW (ref 80.0–100.0)
MONO ABS: 0.9 10*3/uL (ref 0.2–0.9)
MONOS PCT: 8 %
Neutro Abs: 8.5 10*3/uL — ABNORMAL HIGH (ref 1.4–6.5)
Neutrophils Relative %: 75 %
Platelets: 308 10*3/uL (ref 150–440)
RBC: 5.44 MIL/uL — ABNORMAL HIGH (ref 3.80–5.20)
RDW: 14.9 % — AB (ref 11.5–14.5)
WBC: 11.2 10*3/uL — ABNORMAL HIGH (ref 3.6–11.0)

## 2014-09-09 LAB — RAPID STREP SCREEN (MED CTR MEBANE ONLY): Streptococcus, Group A Screen (Direct): NEGATIVE

## 2014-09-09 LAB — URINALYSIS COMPLETE WITH MICROSCOPIC (ARMC ONLY)
Glucose, UA: NEGATIVE mg/dL
Leukocytes, UA: NEGATIVE
Nitrite: NEGATIVE
PH: 5.5 (ref 5.0–8.0)
PROTEIN: 100 mg/dL — AB
Specific Gravity, Urine: 1.03 (ref 1.005–1.030)
WBC, UA: NONE SEEN WBC/hpf (ref <3)

## 2014-09-09 LAB — SEDIMENTATION RATE: SED RATE: 14 mm/h (ref 0–30)

## 2014-09-09 MED ORDER — ONDANSETRON 8 MG PO TBDP
8.0000 mg | ORAL_TABLET | Freq: Once | ORAL | Status: AC
  Administered 2014-09-09: 8 mg via ORAL

## 2014-09-09 MED ORDER — DICYCLOMINE HCL 20 MG PO TABS: 20.0000 mg | ORAL_TABLET | Freq: Two times a day (BID) | ORAL | Status: DC

## 2014-09-09 MED ORDER — ONDANSETRON 4 MG PO TBDP: 4.0000 mg | ORAL_TABLET | Freq: Three times a day (TID) | ORAL | Status: DC | PRN

## 2014-09-09 NOTE — Discharge Instructions (Signed)
BRAT  Bananas rice applesauce toast    Start small -stay small until obvious that body is tolerating OK  Use nausea medications so that you can get your standing medications started back Pick up Pedialyte or Runners replacement drinks to put back salt and potassium  Take your potassium pills twice a day at home for 2 days  If diarrhea won't go awy- a fever develops - or abdominal cramping gets worlse please come back to see [place]... Or at night go to the ER of your choice          Diarrhea Diarrhea is frequent loose and watery bowel movements. It can cause you to feel weak and dehydrated. Dehydration can cause you to become tired and thirsty, have a dry mouth, and have decreased urination that often is dark yellow. Diarrhea is a sign of another problem, most often an infection that will not last long. In most cases, diarrhea typically lasts 2-3 days. However, it can last longer if it is a sign of something more serious. It is important to treat your diarrhea as directed by your caregiver to lessen or prevent future episodes of diarrhea. CAUSES  Some common causes include:  Gastrointestinal infections caused by viruses, bacteria, or parasites.  Food poisoning or food allergies.  Certain medicines, such as antibiotics, chemotherapy, and laxatives.  Artificial sweeteners and fructose.  Digestive disorders. HOME CARE INSTRUCTIONS  Ensure adequate fluid intake (hydration): Have 1 cup (8 oz) of fluid for each diarrhea episode. Avoid fluids that contain simple sugars or sports drinks, fruit juices, whole milk products, and sodas. Your urine should be clear or pale yellow if you are drinking enough fluids. Hydrate with an oral rehydration solution that you can purchase at pharmacies, retail stores, and online. You can prepare an oral rehydration solution at home by mixing the following ingredients together:   - tsp table salt.   tsp baking soda.   tsp salt substitute containing  potassium chloride.  1  tablespoons sugar.  1 L (34 oz) of water.  Certain foods and beverages may increase the speed at which food moves through the gastrointestinal (GI) tract. These foods and beverages should be avoided and include:  Caffeinated and alcoholic beverages.  High-fiber foods, such as raw fruits and vegetables, nuts, seeds, and whole grain breads and cereals.  Foods and beverages sweetened with sugar alcohols, such as xylitol, sorbitol, and mannitol.  Some foods may be well tolerated and may help thicken stool including:  Starchy foods, such as rice, toast, pasta, low-sugar cereal, oatmeal, grits, baked potatoes, crackers, and bagels.  Bananas.  Applesauce.  Add probiotic-rich foods to help increase healthy bacteria in the GI tract, such as yogurt and fermented milk products.  Wash your hands well after each diarrhea episode.  Only take over-the-counter or prescription medicines as directed by your caregiver.  Take a warm bath to relieve any burning or pain from frequent diarrhea episodes. SEEK IMMEDIATE MEDICAL CARE IF:   You are unable to keep fluids down.  You have persistent vomiting.  You have blood in your stool, or your stools are black and tarry.  You do not urinate in 6-8 hours, or there is only a small amount of very dark urine.  You have abdominal pain that increases or localizes.  You have weakness, dizziness, confusion, or light-headedness.  You have a severe headache.  Your diarrhea gets worse or does not get better.  You have a fever or persistent symptoms for more than 2-3 days.  You have a fever and your symptoms suddenly get worse. MAKE SURE YOU:   Understand these instructions.  Will watch your condition.  Will get help right away if you are not doing well or get worse. Document Released: 01/19/2002 Document Revised: 06/15/2013 Document Reviewed: 10/07/2011 [facility] Patient Information 2015 [place], [place]. This information  is not intended to replace advice given to you by your health care provider. Make sure you discuss any questions you have with your health care provider.

## 2014-09-09 NOTE — ED Notes (Signed)
Pt states "body aches and fever since Tuesday."

## 2014-09-09 NOTE — ED Provider Notes (Signed)
CSN: [id number]     Arrival date & time 09/09/14  0900 History   First MD Initiated Contact with Patient 09/09/14 1004     Chief Complaint  Patient presents with  . Generalized Body Aches  . Fever   (Consider location/radiation/quality/duration/timing/severity/associated sxs/prior Treatment) HPI 54 yo F reports onset diarrhea Tuesday afternoon- mild temp elevation intermittently- diarrhea persists, now with abdominal cramping- 3 episodes of brown water last night. Has taken nothing by mouth out of anxiety that it might stimulate additional abdominal cramps. Sore throat. Has NOT taken any of her prescribed medications since onset episode. 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 Commonly has increased abdominal cramping with bowel upset.   Past Medical History  Diagnosis Date  . PONV (postoperative nausea and vomiting)   . Hypertension   . Pneumonia   . Diabetes mellitus without complication     Type 2  . GERD (gastroesophageal reflux disease)   . Arthritis   . Hypercholesteremia   . Disseminated intravascular coagulation 1999    After child birth  . Herpes simplex   . Migraines    Past Surgical History  Procedure Laterality Date  . Hip arthroplasty Right   . Cervical disc surgery    . 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 Hernia repair  2000    Umblical  . Spine surgery    . Dilation and curettage of uterus  1996  . Joint replacement     Family History  Problem Relation Age of Onset  . Hypertension Mother   . Stroke Mother   . Hyperlipidemia Mother   . Heart disease Mother     heart attack   . Diabetes Father   . Cancer Father     lung  . Diabetes Sister   . Hyperlipidemia Sister   . Stroke Paternal Grandfather   . Hypertension Paternal Grandfather   . Stroke Maternal Grandfather   . Alzheimer's disease Paternal Grandmother   . Diabetes Sister   . Cancer Sister     breast   History  Substance Use Topics  . Smoking status: Former Smoker    Quit date: 06/14/1984  . Smokeless tobacco: Never Used  . Alcohol Use: No   OB History    No data available     Review of Systems Constitutional: Intermittent low grade fever reported; last ibuprofen at bedtime last night Eyes: No visual changes. YQM:VHQIONG positive sore throat.; ears neg Cardiovascular:Negative for chest pain/palpitations Respiratory: Negative for shortness of breath Gastrointestinal: Positive  Abdominal cramping pain. No nausea or vomiting;  positive diarrhea Genitourinary: Negative for dysuria. Normal urination. Musculoskeletal: Negative for back pain. FROM extremities without pain Skin: Negative for rash Neurological: Negative for headache, focal weakness or numbness Psych- teary and upset about feeling bad, abd cramping worries her Allergies  Benazepril hcl  Home Medications   Prior to Admission medications   Medication Sig Start Date End Date Taking? Authorizing Provider  aspirin EC 325 MG EC tablet Take 1 tablet (325 mg total) by mouth 2 (two) times daily after a meal. 05/20/14  Yes Elodia Florence, PA-C  chlorthalidone (HYGROTON)  25 MG tablet Take 1 tablet (25 mg total) by mouth daily. 08/17/14  Yes Megan P Johnson, DO  DULoxetine (CYMBALTA) 60 MG capsule Take 60 mg by mouth 2 (two) times daily.   Yes Historical Provider, MD  estradiol (ESTRACE) 0.5 MG tablet Take 1 tablet (0.5 mg total) by mouth daily. 07/28/14  Yes Gabriel Cirri, NP  fenofibrate micronized (LOFIBRA) 134 MG capsule Take 1 capsule (134 mg total) by mouth daily. 07/28/14  Yes Gabriel Cirri, NP  glucose blood test strip 1 each by Other  route daily. Use as instructed   Yes Historical Provider, MD  metoprolol (LOPRESSOR) 50 MG tablet Take 1 tablet (50 mg total) by mouth daily. 07/28/14  Yes Gabriel Cirri, NP  Multiple Vitamins-Minerals (MULTIVITAMIN WITH MINERALS) tablet Take 1 tablet by mouth daily.   Yes Historical Provider, MD  POTASSIUM PO Take 1 tablet by mouth daily.   Yes Historical Provider, MD  sitaGLIPtin (JANUVIA) 100 MG tablet Take 1 tablet (100 mg total) by mouth daily. 07/28/14  Yes Gabriel Cirri, NP  amoxicillin (AMOXIL) 500 MG capsule TAKE 4 CAPSULES BY MOUTH 1 HOUR BEFORE DENTAL APPT 07/18/14   Historical Provider, MD  CRANBERRY PO Take 4,200 mg by mouth daily.    Historical Provider, MD  dicyclomine (BENTYL) 20 MG tablet Take 1 tablet (20 mg total) by mouth 2 (two) times daily. 09/09/14   Rae Halsted, PA-C  FIBER PO Take 1 tablet by mouth every other day.    Historical Provider, MD  HYDROcodone-acetaminophen (NORCO/VICODIN) 5-325 MG per tablet Take 1-2 tablets by mouth every 4 (four) hours as needed (breakthrough pain). 05/20/14   Elodia Florence, PA-C  metFORMIN (GLUCOPHAGE) 500 MG tablet Take 1 tablet (500 mg total) by mouth 2 (two) times daily with a meal. 07/28/14   Gabriel Cirri, NP  methocarbamol (ROBAXIN) 500 MG tablet Take 1 tablet (500 mg total) by mouth every 6 (six) hours as needed for muscle spasms. 05/20/14   Elodia Florence, PA-C  ondansetron (ZOFRAN ODT) 4 MG disintegrating tablet Take 1 tablet (4 mg total) by mouth every 8 (eight) hours as needed for nausea or vomiting. 09/09/14   Rae Halsted, PA-C  simvastatin (ZOCOR) 40 MG tablet Take 1 tablet (40 mg total) by mouth at bedtime. 07/28/14   Gabriel Cirri, NP  sulfamethoxazole-trimethoprim (BACTRIM DS,SEPTRA DS) 800-160 MG per tablet Take 1 tablet by mouth 2 (two) times daily. 07/30/14   Payton Mccallum, MD   BP 136/85 mmHg  Pulse 90  Temp(Src) 99.2 F (37.3 C) (Tympanic)  Resp 16  Ht 5' (1.524 m)  Wt 135 lb (61.236 kg)  BMI 26.37 kg/m2  LMP  (LMP  Unknown) Physical Exam   Constitutional -alert and oriented,teary and worried Head-atraumatic, normocephalic Eyes- conjunctiva normal, EOMI ,conjugate gaze Nose- no congestion or rhinorrhea Mouth/throat- mucous membranes moist ,tongue coated white, oropharynx erythematous;  strep screen done-negative Neck- supple without glandular enlargement CV- pulse 90, grossly normal heart sounds,  Resp-no distress, normal respiratory effort,clear to auscultation bilaterally GI- soft,no distention, mildly tender to palpation- extensive midline scarring and distortion, healed; Bowel sounds WNL to somewhat quiet GU- not examined MSK- no tender, normal ROM, all extremities, ambulatory, self-care Neuro- normal speech and language, no gross focal neurological deficit appreciated, no gait instability, Skin-warm,dry ,intact; no rash noted Psych-mood and affect grossly normal; speech and behavior grossly normal ED Course  Procedures (including critical care time) Labs Review Labs Reviewed  CBC WITH DIFFERENTIAL/PLATELET - Abnormal; Notable for the following:  WBC 11.2 (*)    RBC 5.44 (*)    MCV 76.7 (*)    MCH 25.3 (*)    RDW 14.9 (*)    Neutro Abs 8.5 (*)    All other components within normal limits  COMPREHENSIVE METABOLIC PANEL - Abnormal; Notable for the following:    Sodium 134 (*)    Potassium 3.2 (*)    Chloride 96 (*)    Glucose, Bld 199 (*)    BUN 21 (*)    Creatinine, Ser 1.05 (*)    Total Protein 8.7 (*)    GFR calc non Af Amer 58 (*)    Anion gap 16 (*)    All other components within normal limits  URINALYSIS COMPLETEWITH MICROSCOPIC ([facility] ONLY) - Abnormal; Notable for the following:    Bilirubin Urine 1+ (*)    Ketones, ur TRACE (*)    Hgb urine dipstick TRACE (*)    Protein, ur 100 (*)    Squamous Epithelial / LPF 6-30 (*)    All other components within normal limits  RAPID STREP SCREEN (NOT AT [facility])  CULTURE, GROUP A STREP ([facility] ONLY)  SEDIMENTATION RATE    Imaging  Review No results found.  Medications  ondansetron (ZOFRAN-ODT) disintegrating tablet 8 mg (8 mg Oral Given 09/09/14 1038)   Very well tolerated by patient. Was soon able to tolerate ice chips then clear liquids and took them eagerly. Rested and looked and reportedly felt much better. Had 1 episode of brown water diarrhea while in the suite. Reviewed labs with her in detail. She has K+ replacement at home and will take an extra tablet daily for the next 2 days. PIck up bananas and V-8 to add to diet over next few days-also Pedialyte and 10K or Gatorade with potassium. Sip slowly. Monitor intake by voiding ability every 2 hours. MDM   1. Diarrhea   Diagnosis and treatment discussed. Return to standing order medications ASA she can tolerate. Add new Rx as needed. Move slowly with diet advance.  Questions fielded, expectations and recommendations reviewed. Patient expresses understanding. Out of work today and tomorrow, then she is on weekend until Monday. Will return to [facility] with questions, concern or exacerbation.      Discharge Medication List as of 09/09/2014 11:48 AM    START taking these medications   Details  dicyclomine (BENTYL) 20 MG tablet Take 1 tablet (20 mg total) by mouth 2 (two) times daily., Starting 09/09/2014, Until Discontinued, Print    ondansetron (ZOFRAN ODT) 4 MG disintegrating tablet Take 1 tablet (4 mg total) by mouth every 8 (eight) hours as needed for nausea or vomiting., Starting 09/09/2014, Until Discontinued, Print  Rae Halsted, PA-C 09/10/14 [phone]

## 2014-09-12 ENCOUNTER — Ambulatory Visit
Admission: EM | Admit: 2014-09-12 | Discharge: 2014-09-12 | Disposition: A | Discharge status: Home / Self Care | Payer: BLUE CROSS/BLUE SHIELD | Attending: Emergency Medicine | Admitting: Emergency Medicine

## 2014-09-12 DIAGNOSIS — Z87891 Personal history of nicotine dependence: Secondary | ICD-10-CM | POA: Diagnosis not present

## 2014-09-12 DIAGNOSIS — R197 Diarrhea, unspecified: Secondary | ICD-10-CM | POA: Diagnosis present

## 2014-09-12 DIAGNOSIS — E118 Type 2 diabetes mellitus with unspecified complications: Secondary | ICD-10-CM | POA: Insufficient documentation

## 2014-09-12 DIAGNOSIS — I10 Essential (primary) hypertension: Secondary | ICD-10-CM | POA: Insufficient documentation

## 2014-09-12 DIAGNOSIS — Z79899 Other long term (current) drug therapy: Secondary | ICD-10-CM | POA: Insufficient documentation

## 2014-09-12 DIAGNOSIS — E780 Pure hypercholesterolemia: Secondary | ICD-10-CM | POA: Diagnosis not present

## 2014-09-12 DIAGNOSIS — E876 Hypokalemia: Secondary | ICD-10-CM

## 2014-09-12 LAB — BASIC METABOLIC PANEL
ANION GAP: 11 (ref 5–15)
BUN: 24 mg/dL — ABNORMAL HIGH (ref 6–20)
CHLORIDE: 94 mmol/L — AB (ref 101–111)
CO2: 29 mmol/L (ref 22–32)
Calcium: 10.1 mg/dL (ref 8.9–10.3)
Creatinine, Ser: 1.07 mg/dL — ABNORMAL HIGH (ref 0.44–1.00)
GFR calc Af Amer: >60 mL/min (ref >60)
GFR calc non Af Amer: 57 mL/min — ABNORMAL LOW (ref >60)
Glucose, Bld: 191 mg/dL — ABNORMAL HIGH (ref 65–99)
Potassium: 3.1 mmol/L — ABNORMAL LOW (ref 3.5–5.1)
SODIUM: 134 mmol/L — AB (ref 135–145)

## 2014-09-12 LAB — CBC WITH DIFFERENTIAL/PLATELET
BASOS PCT: 1 %
Basophils Absolute: 0.1 10*3/uL (ref 0–0.1)
EOS ABS: 0.3 10*3/uL (ref 0–0.7)
EOS PCT: 4 %
HCT: 39 % (ref 35.0–47.0)
HEMOGLOBIN: 13.2 g/dL (ref 12.0–16.0)
LYMPHS ABS: 1.8 10*3/uL (ref 1.0–3.6)
Lymphocytes Relative: 23 %
MCH: 25.6 pg — ABNORMAL LOW (ref 26.0–34.0)
MCHC: 33.9 g/dL (ref 32.0–36.0)
MCV: 75.5 fL — ABNORMAL LOW (ref 80.0–100.0)
MONOS PCT: 13 %
Monocytes Absolute: 1 10*3/uL — ABNORMAL HIGH (ref 0.2–0.9)
Neutro Abs: 4.7 10*3/uL (ref 1.4–6.5)
Neutrophils Relative %: 59 %
Platelets: 325 10*3/uL (ref 150–440)
RBC: 5.17 MIL/uL (ref 3.80–5.20)
RDW: 14.4 % (ref 11.5–14.5)
WBC: 8 10*3/uL (ref 3.6–11.0)

## 2014-09-12 LAB — C DIFFICILE QUICK SCREEN W PCR REFLEX
C DIFFICILE (CDIFF) INTERP: NEGATIVE
C DIFFICILE (CDIFF) TOXIN: NEGATIVE
C Diff antigen: NEGATIVE

## 2014-09-12 LAB — CULTURE, GROUP A STREP (THRC)

## 2014-09-12 LAB — OCCULT BLOOD X 1 CARD TO LAB, STOOL: Fecal Occult Bld: NEGATIVE

## 2014-09-12 MED ORDER — CIPROFLOXACIN HCL 500 MG PO TABS: 500.0000 mg | ORAL_TABLET | Freq: Two times a day (BID) | ORAL | Status: DC

## 2014-09-12 MED ORDER — ACIDOPHILUS PROBIOTIC BLEND PO CAPS: 2.0000 | ORAL_CAPSULE | Freq: Two times a day (BID) | ORAL | Status: DC

## 2014-09-12 NOTE — ED Provider Notes (Addendum)
HPI  SUBJECTIVE:  Tracy Bryan is a 54 y.o. female who presents with persistent loose, watery, foul-smelling diarrhea. States that his been going on for approximately 5 days. She reports 6-7 episodes per day. She reports constant, lower abdominal pain described  as cramping with no radiation, migration for the past 5 days. She has had similar pain before. It has not changed since she started having the diarrhea.  She has tried BRAT/liquid diet without improvement. Symptoms are worse with eating solids, no alleviating factors. She denies any travel, recent antibiotics, raw uncooked foods. She does work at a daycare. No exposure to Turtles, chicken feces. No change in medications.  She states that she had fevers early on the illness, MAXIMUM TEMPERATURE 101.3, but has not had a fever for several days. No nausea, vomiting. No abdominal distention. She reports slightly decreased urine output, urinating about 3 times a day, but has no urinary complaints. States that her glucose has been running within normal limits for her. She was seen here several days ago for the same, thought to have a viral diarrhea, sent home with Brat/liquid diet. She was advised to be on a liquid diet, with a gradual return to solids, but has been eating a regular diet.  She was also found to be mildly hypokalemic, which patient has had problems with before, and was advised to increase her potassium supplements. Patient states she has been doing this. 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 No history of C. Difficile. Family history + sister with C. Difficile    Past Medical History  Diagnosis Date  . PONV (postoperative nausea and vomiting)   . Hypertension   . Pneumonia   . Diabetes mellitus without complication     Type 2  . GERD (gastroesophageal reflux disease)   . Arthritis   . Hypercholesteremia   . Disseminated intravascular  coagulation 1999    After child birth  . Herpes simplex   . Migraines     Past Surgical History  Procedure Laterality Date  . Hip arthroplasty Right   . Cervical disc surgery    . 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 Hernia repair  2000    [facility]  . Spine surgery    . Dilation and curettage of uterus  1996  . Joint replacement      Family History  Problem Relation Age of Onset  . Hypertension Mother   . Stroke Mother   . Hyperlipidemia Mother   . Heart disease Mother     heart attack  . Diabetes Father   . Cancer Father     lung  . Diabetes Sister   . Hyperlipidemia Sister   . Stroke Paternal Grandfather   . Hypertension Paternal Grandfather   . Stroke Maternal Grandfather   . Alzheimer's disease Paternal Grandmother   . Diabetes Sister   . Cancer Sister     breast    History  Substance Use Topics  . Smoking status: Former Smoker    Quit date: 06/14/1984  . Smokeless tobacco: Never Used  . Alcohol Use: No    No current facility-administered medications for this encounter.  Current outpatient prescriptions:  .  amoxicillin (AMOXIL) 500 MG capsule, TAKE 4 CAPSULES BY MOUTH 1 HOUR BEFORE DENTAL APPT, Disp: , Rfl: 2 .  aspirin EC 325 MG EC tablet, Take 1 tablet (325 mg total) by mouth 2 (two) times daily after a meal., Disp: 60 tablet, Rfl: 0 .  chlorthalidone (HYGROTON) 25 MG tablet, Take 1 tablet (25 mg total) by mouth daily., Disp: 30 tablet, Rfl: 6 .  CRANBERRY PO, Take 4,200 mg by mouth daily., Disp: , Rfl:  .  dicyclomine (BENTYL) 20 MG tablet, Take 1 tablet (20 mg total) by mouth 2 (two) times daily., Disp: 20 tablet, Rfl: 0 .  DULoxetine (CYMBALTA) 60 MG capsule, Take 60 mg by mouth 2 (two) times daily., Disp: , Rfl:  .  estradiol (ESTRACE) 0.5 MG tablet, Take 1 tablet (0.5 mg total) by mouth daily.,  Disp: 90 tablet, Rfl: 3 .  fenofibrate micronized (LOFIBRA) 134 MG capsule, Take 1 capsule (134 mg total) by mouth daily., Disp: 90 capsule, Rfl: 1 .  FIBER PO, Take 1 tablet by mouth every other day., Disp: , Rfl:  .  glucose blood test strip, 1 each by Other route daily. Use as instructed, Disp: , Rfl:  .  HYDROcodone-acetaminophen (NORCO/VICODIN) 5-325 MG per tablet, Take 1-2 tablets by mouth every 4 (four) hours as needed (breakthrough pain)., Disp: 50 tablet, Rfl: 0 .  metFORMIN (GLUCOPHAGE) 500 MG tablet, Take 1 tablet (500 mg total) by mouth 2 (two) times daily with a meal., Disp: 180 tablet, Rfl: 1 .  methocarbamol (ROBAXIN) 500 MG tablet, Take 1 tablet (500 mg total) by mouth every 6 (six) hours as needed for muscle spasms., Disp: 50 tablet, Rfl: 0 .  metoprolol (LOPRESSOR) 50 MG tablet, Take 1 tablet (50 mg total) by mouth daily., Disp: 90 tablet, Rfl: 1 .  Multiple Vitamins-Minerals (MULTIVITAMIN WITH MINERALS) tablet, Take 1 tablet by mouth daily., Disp: , Rfl:  .  ondansetron (ZOFRAN ODT) 4 MG disintegrating tablet, Take 1 tablet (4 mg total) by mouth every 8 (eight) hours as needed for nausea or vomiting., Disp: 12 tablet, Rfl: 0 .  POTASSIUM PO, Take 1 tablet by mouth daily., Disp: , Rfl:  .  simvastatin (ZOCOR) 40 MG tablet, Take 1 tablet (40 mg total) by mouth at bedtime., Disp: 90 tablet, Rfl: 1 .  sitaGLIPtin (JANUVIA) 100 MG tablet, Take 1 tablet (100 mg total) by mouth daily., Disp: 90 tablet, Rfl: 1 .  sulfamethoxazole-trimethoprim (BACTRIM DS,SEPTRA DS) 800-160 MG per tablet, Take 1 tablet by mouth 2 (two) times daily., Disp: 14 tablet, Rfl: 0  Allergies  Allergen Reactions  . Benazepril Hcl Cough     ROS  As noted in HPI.   Physical Exam  BP 118/87 mmHg  Pulse 84  Temp(Src) 98.2 F (36.8 C) (Oral)  Resp 20  Ht 5' (1.524 m)  Wt 135 lb (61.236 kg)  BMI 26.37 kg/m2  SpO2 99%  LMP  (LMP Unknown)  Constitutional: Well developed, well nourished, no acute  distress Eyes: PERRL, EOMI, conjunctiva normal bilaterally HENT: Normocephalic, atraumatic,mucus membranes moist Respiratory: Clear to auscultation bilaterally, no rales, no wheezing, no rhonchi Cardiovascular: Normal rate and rhythm, no murmurs, no gallops, no rubs GI:  midline surgical scar. Diffuse tenderness maximal at gastric region. Soft, normal bowel sounds, nontender, no rebound, no guarding  Rectal: No melena, hematochezia. Hemoccult pending Back: no CVAT skin: No rash, skin intact Musculoskeletal: No edema, no tenderness, no deformities Neurologic: Alert & oriented x 3, CN II-XII grossly intact, no motor deficits, sensation grossly intact Psychiatric: Speech and behavior appropriate   ED Course   Medications - No data to display  Orders Placed This Encounter  Procedures  . C difficile quick scan w PCR reflex ([facility] only)    Laxatives (last 72 hours)    None    Standing Status: Standing     Number of Occurrences: 1     Standing Expiration Date:     Order Specific Question:  Is your patient experiencing loose or watery stools (3 or more in 24 hours)?    Answer:  Yes    Order Specific Question:  Has the patient received laxatives in the last 24 hours?    Answer:  No    Order Specific Question:  Has a negative Cdiff test resulted in the last 7 days?    Answer:  No  . Stool culture    Standing Status: Standing     Number of Occurrences: 1     Standing Expiration Date:     Order Specific Question:  Patient immune status    Answer:  Normal  . Ova and parasite examination    Standing Status: Standing     Number of Occurrences: 1     Standing Expiration Date:     Order Specific Question:  Patient immune status    Answer:  Normal  . CBC with Differential    Standing Status: Standing     Number of Occurrences: 1     Standing Expiration Date:   . Basic metabolic panel    Standing Status: Standing     Number of Occurrences: 1     Standing Expiration Date:   . Occult  blood card to lab, stool Provider will collect    Standing Status: Standing     Number of Occurrences: 1     Standing Expiration Date:     Order Specific Question:  Specimen to be collected by?    Answer:  Provider will collect  . Enteric precautions (UV disinfection)    Standing Status: Standing     Number of Occurrences: 1     Standing Expiration Date:    Results for orders placed or performed during the hospital encounter of 09/12/14 (from the past 24 hour(s))  CBC with Differential     Status: Abnormal   Collection Time: 09/12/14  1:28 PM  Result Value Ref Range   WBC 8.0 3.6 - 11.0 K/uL   RBC 5.17 3.80 - 5.20 MIL/uL   Hemoglobin 13.2 12.0 - 16.0 g/dL   HCT 16.1 09.6 - 04.5 %   MCV 75.5 (L) 80.0 - 100.0 fL   MCH 25.6 (L) 26.0 - 34.0 pg   MCHC 33.9 32.0 - 36.0 g/dL   RDW 40.9 81.1 - 91.4 %   Platelets 325 150 - 440 K/uL   Neutrophils Relative % 59 %   Neutro Abs 4.7 1.4 - 6.5 K/uL   Lymphocytes Relative 23 %   Lymphs Abs 1.8 1.0 - 3.6 K/uL   Monocytes Relative 13 %   Monocytes Absolute 1.0 (H) 0.2 - 0.9 K/uL   Eosinophils Relative 4 %   Eosinophils Absolute 0.3 0 - 0.7 K/uL   Basophils Relative 1 %   Basophils Absolute 0.1 0 - 0.1 K/uL  Basic metabolic panel     Status: Abnormal   Collection Time: 09/12/14  1:28 PM  Result Value Ref Range   Sodium 134 (L) 135 - 145 mmol/L   Potassium 3.1 (L) 3.5 - 5.1 mmol/L   Chloride 94 (L) 101 - 111 mmol/L   CO2 29 22 - 32 mmol/L   Glucose,  Bld 191 (H) 65 - 99 mg/dL   BUN 24 (H) 6 - 20 mg/dL   Creatinine, Ser 1.61 (H) 0.44 - 1.00 mg/dL   Calcium 09.6 8.9 - 04.5 mg/dL   GFR calc non Af Amer 57 (L) >60 mL/min   GFR calc Af Amer >60 >60 mL/min   Anion gap 11 5 - 15  Occult blood card to lab, stool Provider will collect     Status: None   Collection Time: 09/12/14  1:28 PM  Result Value Ref Range   Fecal Occult Bld NEGATIVE NEGATIVE   Results for orders placed or performed during the hospital encounter of 09/12/14  C difficile  quick scan w PCR reflex ([facility] only)  Result Value Ref Range   C Diff antigen NEGATIVE NEGATIVE   C Diff toxin NEGATIVE NEGATIVE   C Diff interpretation Negative for C. difficile   CBC with Differential  Result Value Ref Range   WBC 8.0 3.6 - 11.0 K/uL   RBC 5.17 3.80 - 5.20 MIL/uL   Hemoglobin 13.2 12.0 - 16.0 g/dL   HCT 40.9 81.1 - 91.4 %   MCV 75.5 (L) 80.0 - 100.0 fL   MCH 25.6 (L) 26.0 - 34.0 pg   MCHC 33.9 32.0 - 36.0 g/dL   RDW 78.2 95.6 - 21.3 %   Platelets 325 150 - 440 K/uL   Neutrophils Relative % 59 %   Neutro Abs 4.7 1.4 - 6.5 K/uL   Lymphocytes Relative 23 %   Lymphs Abs 1.8 1.0 - 3.6 K/uL   Monocytes Relative 13 %   Monocytes Absolute 1.0 (H) 0.2 - 0.9 K/uL   Eosinophils Relative 4 %   Eosinophils Absolute 0.3 0 - 0.7 K/uL   Basophils Relative 1 %   Basophils Absolute 0.1 0 - 0.1 K/uL  Basic metabolic panel  Result Value Ref Range   Sodium 134 (L) 135 - 145 mmol/L   Potassium 3.1 (L) 3.5 - 5.1 mmol/L   Chloride 94 (L) 101 - 111 mmol/L   CO2 29 22 - 32 mmol/L   Glucose, Bld 191 (H) 65 - 99 mg/dL   BUN 24 (H) 6 - 20 mg/dL   Creatinine, Ser 0.86 (H) 0.44 - 1.00 mg/dL   Calcium 57.8 8.9 - 46.9 mg/dL   GFR calc non Af Amer 57 (L) >60 mL/min   GFR calc Af Amer >60 >60 mL/min   Anion gap 11 5 - 15  Occult blood card to lab, stool Provider will collect  Result Value Ref Range   Fecal Occult Bld NEGATIVE NEGATIVE    No results found. .ED Clinical Impression  Diarrhea  ED Assessment/Plan  Abdomen benign, vitals normal, patient afebrile. Patient states that she has had this pain before, and it is not any different today. Checking CBC, potassium, renal function. Also sent a stool cultures, C. difficile, O&P.  Labs are unchanged from 2 days ago. H&H stable. Slightly increase in creatinine, but patient is not in renal failure. It is around her baseline. She still has some mild hypokalemia-  increase oral potassium.Marland Kitchen we'll send home with Cipro 500 mg twice a day  for 5 days to treat empirically for an infectious diarrhea. This will cover Campylobacter, Salmonella, Shigella, Yersinia.  stool cultures to guide further antibiotic therapy. We'll have her start the Bentyl- she has not started this yet, reiterated the liquid diet with gradual return to solid foods. Patient declined prescription for Zofran. Discussed with patient the possibility of doing imaging,  but we have agreed to not do it unless pain changes, gets worse, has fevers, melena, hemtochezia.  Discussed labs MDM, plan and followup with patient. Discussed sn/sx that should prompt return to the UC or ED. Patient agrees with plan.    1644- negative C. difficile results noted.  *This clinic note was created using Dragon dictation software. Therefore, there may be occasional mistakes despite careful proofreading.  ?  Domenick Gong, MD 09/12/14 1432  Domenick Gong, MD 09/12/14 [phone]

## 2014-09-12 NOTE — Discharge Instructions (Signed)
Go to the ER if your belly pain changes in quality or location, gets worse, if you start  having fevers above 100.4, if you have blood in your stool or black tarry stools, abdominal distention. You will most likely need imaging at that time. Increase your potassium intake, either by eating bananas, V8 juice, or you can take your potassium supplement up to twice a day. Start hydrating with Pedialyte and and runners replacement drinks such as Gatorade, or Ten-K. Start taking the Bentyl. This will help with the cramping and slow the diarrhea down. I would suggest having your kidney function repeated in 1 week to make sure that you getting better. Also start some probiotics.

## 2014-09-12 NOTE — ED Notes (Signed)
Patient still continues to have diarrhea from last week. Fever has improved.

## 2014-09-13 NOTE — Telephone Encounter (Signed)
Patient never returned call so I am closing encounter.

## 2014-09-15 LAB — O&P RESULT

## 2014-09-16 LAB — STOOL CULTURE
Culture: NOT DETECTED
SPECIAL REQUESTS: NORMAL

## 2014-10-01 LAB — OVA AND PARASITE EXAMINATION: Special Requests: NORMAL

## 2014-10-01 LAB — OVA + PARASITE EXAM

## 2014-10-11 ENCOUNTER — Telehealth: Payer: Self-pay

## 2014-10-11 MED ORDER — LANSOPRAZOLE 30 MG PO CPDR: 30.0000 mg | DELAYED_RELEASE_CAPSULE | Freq: Every day | ORAL | Status: DC

## 2014-10-11 NOTE — Telephone Encounter (Signed)
Patient called stating that she has been having trouble with acid reflux. Patient has been taking Prevacid OTC and it is very expensive for her. She wants to know if she can be written a rx for acid reflux. Patient was last seen in June and pharmacy is [facility] in [place].

## 2014-10-11 NOTE — Telephone Encounter (Signed)
Done

## 2014-10-11 NOTE — Telephone Encounter (Signed)
Called and let patient know that rx was sent to pharmacy.  

## 2014-11-09 ENCOUNTER — Other Ambulatory Visit: Payer: Self-pay

## 2014-12-02 ENCOUNTER — Other Ambulatory Visit: Payer: Self-pay

## 2014-12-02 NOTE — Telephone Encounter (Signed)
PATIENT: Tracy Brynda RimMILHOLLAND LAST VISIT: 07/28/2014  Patient requests metformin hcl 500mg  tab

## 2014-12-03 MED ORDER — METFORMIN HCL 500 MG PO TABS: 500.0000 mg | ORAL_TABLET | Freq: Two times a day (BID) | ORAL | Status: DC

## 2014-12-21 ENCOUNTER — Encounter: Payer: Self-pay | Admitting: Emergency Medicine

## 2014-12-21 ENCOUNTER — Ambulatory Visit
Admission: EM | Admit: 2014-12-21 | Discharge: 2014-12-21 | Disposition: A | Discharge status: Home / Self Care | Payer: BLUE CROSS/BLUE SHIELD | Attending: Family Medicine | Admitting: Family Medicine

## 2014-12-21 DIAGNOSIS — J0100 Acute maxillary sinusitis, unspecified: Secondary | ICD-10-CM | POA: Diagnosis not present

## 2014-12-21 MED ORDER — AMOXICILLIN-POT CLAVULANATE 875-125 MG PO TABS: 1.0000 | ORAL_TABLET | Freq: Two times a day (BID) | ORAL | Status: DC

## 2014-12-21 MED ORDER — HYDROCOD POLST-CPM POLST ER 10-8 MG/5ML PO SUER: 5.0000 mL | Freq: Two times a day (BID) | ORAL | Status: DC

## 2014-12-21 NOTE — ED Notes (Signed)
Patient c/o sneezing, HAs, nasal congestion and sinus pressure for 3 weeks.

## 2014-12-21 NOTE — Discharge Instructions (Signed)
Sinusitis, Adult  Sinusitis is redness, soreness, and inflammation of the paranasal sinuses. Paranasal sinuses are air pockets within the bones of your face. They are located beneath your eyes, in the middle of your forehead, and above your eyes. In healthy paranasal sinuses, mucus is able to drain out, and air is able to circulate through them by way of your nose. However, when your paranasal sinuses are inflamed, mucus and air can become trapped. This can allow bacteria and other germs to grow and cause infection.  Sinusitis can develop quickly and last only a short time (acute) or continue over a long period (chronic). Sinusitis that lasts for more than 12 weeks is considered chronic.  CAUSES  Causes of sinusitis include:  · Allergies.  · Structural abnormalities, such as displacement of the cartilage that separates your nostrils (deviated septum), which can decrease the air flow through your nose and sinuses and affect sinus drainage.  · Functional abnormalities, such as when the small hairs (cilia) that line your sinuses and help remove mucus do not work properly or are not present.  SIGNS AND SYMPTOMS  Symptoms of acute and chronic sinusitis are the same. The primary symptoms are pain and pressure around the affected sinuses. Other symptoms include:  · Upper toothache.  · Earache.  · Headache.  · Bad breath.  · Decreased sense of smell and taste.  · A cough, which worsens when you are lying flat.  · Fatigue.  · Fever.  · Thick drainage from your nose, which often is green and may contain pus (purulent).  · Swelling and warmth over the affected sinuses.  DIAGNOSIS  Your health care provider will perform a physical exam. During your exam, your health care provider may perform any of the following to help determine if you have acute sinusitis or chronic sinusitis:  · Look in your nose for signs of abnormal growths in your nostrils (nasal polyps).  · Tap over the affected sinus to check for signs of  infection.  · View the inside of your sinuses using an imaging device that has a light attached (endoscope).  If your health care provider suspects that you have chronic sinusitis, one or more of the following tests may be recommended:  · Allergy tests.  · Nasal culture. A sample of mucus is taken from your nose, sent to a lab, and screened for bacteria.  · Nasal cytology. A sample of mucus is taken from your nose and examined by your health care provider to determine if your sinusitis is related to an allergy.  TREATMENT  Most cases of acute sinusitis are related to a viral infection and will resolve on their own within 10 days. Sometimes, medicines are prescribed to help relieve symptoms of both acute and chronic sinusitis. These may include pain medicines, decongestants, nasal steroid sprays, or saline sprays.  However, for sinusitis related to a bacterial infection, your health care provider will prescribe antibiotic medicines. These are medicines that will help kill the bacteria causing the infection.  Rarely, sinusitis is caused by a fungal infection. In these cases, your health care provider will prescribe antifungal medicine.  For some cases of chronic sinusitis, surgery is needed. Generally, these are cases in which sinusitis recurs more than 3 times per year, despite other treatments.  HOME CARE INSTRUCTIONS  · Drink plenty of water. Water helps thin the mucus so your sinuses can drain more easily.  · Use a humidifier.  · Inhale steam 3-4 times a day (for   example, sit in the bathroom with the shower running).  · Apply a warm, moist washcloth to your face 3-4 times a day, or as directed by your health care provider.  · Use saline nasal sprays to help moisten and clean your sinuses.  · Take medicines only as directed by your health care provider.  · If you were prescribed either an antibiotic or antifungal medicine, finish it all even if you start to feel better.  SEEK IMMEDIATE MEDICAL CARE IF:  · You have  increasing pain or severe headaches.  · You have nausea, vomiting, or drowsiness.  · You have swelling around your face.  · You have vision problems.  · You have a stiff neck.  · You have difficulty breathing.     This information is not intended to replace advice given to you by your health care provider. Make sure you discuss any questions you have with your health care provider.     Document Released: 01/29/2005 Document Revised: 02/19/2014 Document Reviewed: 02/13/2011  Elsevier Interactive Patient Education ©2016 [employer]  Sinus Rinse  WHAT IS A SINUS RINSE?  A sinus rinse is a simple home treatment that is used to rinse your sinuses with a sterile mixture of salt and water (saline solution). Sinuses are air-filled spaces in your skull behind the bones of your face and forehead that open into your nasal cavity.  You will use the following:  · Saline solution.  · Neti pot or spray bottle. This releases the saline solution into your nose and through your sinuses. Neti pots and spray bottles can be purchased at your local pharmacy, a health food store, or online.  WHEN WOULD I DO A SINUS RINSE?  A sinus rinse can help to clear mucus, dirt, dust, or pollen from the nasal cavity. You may do a sinus rinse when you have a cold, a virus, nasal allergy symptoms, a sinus infection, or stuffiness in the nose or sinuses.  If you are considering a sinus rinse:  · Ask your child's health care provider before performing a sinus rinse on your child.  · Do not do a sinus rinse if you have had ear or nasal surgery, ear infection, or blocked ears.  HOW DO I DO A SINUS RINSE?  · Wash your hands.  · Disinfect your device according to the directions provided and then dry it.  · Use the solution that comes with your device or one that is sold separately in stores. Follow the mixing directions on the package.  · Fill your device with the amount of saline solution as directed by the device instructions.  · Stand over a sink and  tilt your head sideways over the sink.  · Place the spout of the device in your upper nostril (the one closer to the ceiling).  · Gently pour or squeeze the saline solution into the nasal cavity. The liquid should drain to the lower nostril if you are not overly congested.  · Gently blow your nose. Blowing too hard may cause ear pain.  · Repeat in the other nostril.  · Clean and rinse your device with clean water and then air-dry it.  ARE THERE RISKS OF A SINUS RINSE?   Sinus rinse is generally very safe and effective. However, there are a few risks, which include:   · A burning sensation in the sinuses. This may happen if you do not make the saline solution as directed. Make sure to follow all directions when   making the saline solution.  · Infection from contaminated water. This is rare, but possible.  · Nasal irritation.     This information is not intended to replace advice given to you by your health care provider. Make sure you discuss any questions you have with your health care provider.     Document Released: 08/26/2013 Document Reviewed: 08/26/2013  Elsevier Interactive Patient Education ©2016 [employer]

## 2014-12-23 ENCOUNTER — Telehealth: Payer: Self-pay

## 2014-12-23 NOTE — ED Notes (Signed)
[facility] called [facility] and prescribed Augmentin and Tussionex. "Something is making me itch". Dr. Thurmond ButtsWade given message and patient to stop Augmentin now and call [facility] tomorrow and update how is feeling.

## 2014-12-24 ENCOUNTER — Encounter: Payer: Self-pay | Admitting: Emergency Medicine

## 2014-12-24 ENCOUNTER — Telehealth: Payer: Self-pay

## 2014-12-24 ENCOUNTER — Ambulatory Visit
Admission: EM | Admit: 2014-12-24 | Discharge: 2014-12-24 | Disposition: A | Discharge status: Home / Self Care | Payer: BLUE CROSS/BLUE SHIELD | Attending: Family Medicine | Admitting: Family Medicine

## 2014-12-24 DIAGNOSIS — T50905A Adverse effect of unspecified drugs, medicaments and biological substances, initial encounter: Secondary | ICD-10-CM

## 2014-12-24 DIAGNOSIS — J0110 Acute frontal sinusitis, unspecified: Secondary | ICD-10-CM

## 2014-12-24 DIAGNOSIS — J039 Acute tonsillitis, unspecified: Secondary | ICD-10-CM

## 2014-12-24 DIAGNOSIS — T887XXA Unspecified adverse effect of drug or medicament, initial encounter: Secondary | ICD-10-CM | POA: Diagnosis not present

## 2014-12-24 DIAGNOSIS — H6593 Unspecified nonsuppurative otitis media, bilateral: Secondary | ICD-10-CM | POA: Diagnosis not present

## 2014-12-24 LAB — RAPID STREP SCREEN (MED CTR MEBANE ONLY): Streptococcus, Group A Screen (Direct): NEGATIVE

## 2014-12-24 MED ORDER — ACETAMINOPHEN 500 MG PO TABS
1000.0000 mg | ORAL_TABLET | Freq: Once | ORAL | Status: AC
  Administered 2014-12-24: 1000 mg via ORAL

## 2014-12-24 MED ORDER — DIPHENHYDRAMINE HCL 12.5 MG/5ML PO ELIX: 25.0000 mg | ORAL_SOLUTION | Freq: Three times a day (TID) | ORAL | Status: DC | PRN

## 2014-12-24 MED ORDER — DIPHENHYDRAMINE HCL 12.5 MG/5ML PO ELIX
12.5000 mg | ORAL_SOLUTION | Freq: Once | ORAL | Status: AC
  Administered 2014-12-24: 12.5 mg via ORAL

## 2014-12-24 MED ORDER — ACETAMINOPHEN 500 MG PO TABS: 1000.0000 mg | ORAL_TABLET | Freq: Once | ORAL | Status: DC

## 2014-12-24 MED ORDER — SULFAMETHOXAZOLE-TRIMETHOPRIM 800-160 MG PO TABS: 1.0000 | ORAL_TABLET | Freq: Two times a day (BID) | ORAL | Status: DC

## 2014-12-24 MED ORDER — PREDNISONE 20 MG PO TABS: 40.0000 mg | ORAL_TABLET | Freq: Every day | ORAL | Status: AC

## 2014-12-24 NOTE — ED Notes (Signed)
Called to say stopped Augmentin and has no further itching, however still remains with sinusitis. Dr. Judd Gaudieronty reviewed chart and Bactrim sent via escribe to [facility] [place]. Patient informed

## 2014-12-24 NOTE — ED Provider Notes (Addendum)
CSN: [id number]     Arrival date & time 12/24/14  1810 History   First MD Initiated Contact with Patient 12/24/14 1907     Chief Complaint  Patient presents with  . Facial Pain  . Medication Reaction   (Consider location/radiation/quality/duration/timing/severity/associated sxs/prior Treatment) HPI Comments: Caucasian female married here for re-evaluation of post nasal drip, pressure, frontal headache, throat feeling swollen.  Was initially evaluated by Dr Judd Gaudier on started on augmentin took 4-5 doses and began to itch called clinic and was told to stop augmentin.  Started on Bactrim today took 1 dose and felt like throat swelling up here for re-evaluation  The history is provided by the patient.    Past Medical History  Diagnosis Date  . PONV (postoperative nausea and vomiting)   . Hypertension   . Pneumonia   . Diabetes mellitus without complication (HCC)     Type 2  . GERD (gastroesophageal reflux disease)   . Arthritis   . Hypercholesteremia   . Disseminated intravascular coagulation (HCC) 1999    After child birth  . Herpes simplex   . Migraines    Past Surgical History  Procedure Laterality Date  . Hip arthroplasty Right   . Cervical disc surgery    . 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 Hernia repair  2000    [place]  . Spine surgery    . Dilation and curettage of uterus  1996  . Joint replacement     Family History  Problem Relation Age of Onset  . Hypertension Mother   . Stroke Mother   . Hyperlipidemia Mother   . Heart disease Mother     heart attack  . Diabetes Father   . Cancer Father     lung  . Diabetes Sister   . Hyperlipidemia Sister   . Stroke Paternal Grandfather   . Hypertension Paternal Grandfather   . Stroke Maternal Grandfather   . Alzheimer's disease Paternal Grandmother   . Diabetes  Sister   . Cancer Sister     breast   Social History  Substance Use Topics  . Smoking status: Former Smoker    Quit date: 06/14/1984  . Smokeless tobacco: Never Used  . Alcohol Use: No   OB History    No data available     Review of Systems  Constitutional: Positive for activity change, appetite change and fatigue. Negative for fever, chills, diaphoresis and unexpected weight change.  HENT: Positive for congestion, postnasal drip, sinus pressure and sore throat. Negative for dental problem, drooling, ear discharge, ear pain, facial swelling, hearing loss, mouth sores, nosebleeds, rhinorrhea, sneezing, tinnitus, trouble swallowing and voice change.   Eyes: Negative for photophobia, pain, discharge, redness, itching and visual disturbance.  Respiratory: Negative for cough, choking, chest tightness, shortness of breath, wheezing and stridor.   Cardiovascular: Negative for chest pain, palpitations and leg swelling.  Gastrointestinal: Negative for nausea, vomiting, abdominal pain, diarrhea, constipation, blood in stool and abdominal distention.  Endocrine: Negative for cold intolerance and heat intolerance.  Genitourinary: Negative for dysuria, hematuria and difficulty urinating.  Musculoskeletal: Negative for myalgias, back pain, joint swelling, arthralgias, gait problem, neck pain and neck stiffness.  Skin: Negative for color change, pallor, rash and wound.  Allergic/Immunologic: Positive for environmental allergies. Negative for food allergies.  Neurological: Positive for headaches. Negative for dizziness, tremors, seizures, syncope,  facial asymmetry, speech difficulty, weakness, light-headedness and numbness.  Hematological: Negative for adenopathy. Does not bruise/bleed easily.  Psychiatric/Behavioral: Negative for behavioral problems, confusion and agitation.    Allergies  Augmentin; Benazepril hcl; and Penicillins  Home Medications   Prior to Admission medications   Medication  Sig Start Date End Date Taking? Authorizing Provider  aspirin EC 325 MG EC tablet Take 1 tablet (325 mg total) by mouth 2 (two) times daily after a meal. 05/20/14  Yes Elodia FlorenceAndrew Nida, PA-C  chlorpheniramine-HYDROcodone (TUSSIONEX PENNKINETIC ER) 10-8 MG/5ML SUER Take 5 mLs by mouth 2 (two) times daily. 12/21/14  Yes Lutricia FeilWilliam P Roemer, PA-C  DULoxetine (CYMBALTA) 60 MG capsule Take 60 mg by mouth 2 (two) times daily.   Yes Historical Provider, MD  estradiol (ESTRACE) 0.5 MG tablet Take 1 tablet (0.5 mg total) by mouth daily. 07/28/14  Yes Gabriel Cirriheryl Wicker, NP  fenofibrate micronized (LOFIBRA) 134 MG capsule Take 1 capsule (134 mg total) by mouth daily. 07/28/14  Yes Gabriel Cirriheryl Wicker, NP  FIBER PO Take 1 tablet by mouth every other day.   Yes Historical Provider, MD  glucose blood test strip 1 each by Other route daily. Use as instructed   Yes Historical Provider, MD  lansoprazole (PREVACID) 30 MG capsule Take 1 capsule (30 mg total) by mouth daily at 12 noon. 10/11/14  Yes Gabriel Cirriheryl Wicker, NP  metFORMIN (GLUCOPHAGE) 500 MG tablet Take 1 tablet (500 mg total) by mouth 2 (two) times daily with a meal. 12/03/14  Yes Megan P Johnson, DO  metoprolol (LOPRESSOR) 50 MG tablet Take 1 tablet (50 mg total) by mouth daily. 07/28/14  Yes Gabriel Cirriheryl Wicker, NP  Multiple Vitamins-Minerals (MULTIVITAMIN WITH MINERALS) tablet Take 1 tablet by mouth daily.   Yes Historical Provider, MD  POTASSIUM PO Take 1 tablet by mouth daily.   Yes Historical Provider, MD  simvastatin (ZOCOR) 40 MG tablet Take 1 tablet (40 mg total) by mouth at bedtime. 07/28/14  Yes Gabriel Cirriheryl Wicker, NP  sitaGLIPtin (JANUVIA) 100 MG tablet Take 1 tablet (100 mg total) by mouth daily. 07/28/14  Yes Gabriel Cirriheryl Wicker, NP  acetaminophen (TYLENOL) 500 MG tablet Take 2 tablets (1,000 mg total) by mouth once. 12/24/14   Barbaraann Barthelina A Caine Barfield, NP  chlorthalidone (HYGROTON) 25 MG tablet Take 1 tablet (25 mg total) by mouth daily. 08/17/14   Megan P Johnson, DO  CRANBERRY PO Take 4,200 mg by  mouth daily.    Historical Provider, MD  diphenhydrAMINE (BENADRYL) 12.5 MG/5ML elixir Take 10 mLs (25 mg total) by mouth every 8 (eight) hours as needed for itching or sleep. 12/24/14   Barbaraann Barthelina A Chaniqua Brisby, NP  predniSONE (DELTASONE) 20 MG tablet Take 2 tablets (40 mg total) by mouth daily. 12/25/14 12/29/14  Barbaraann Barthelina A Illona Bulman, NP   Meds Ordered and Administered this Visit   Medications  acetaminophen (TYLENOL) tablet 1,000 mg (1,000 mg Oral Given 12/24/14 1926)  diphenhydrAMINE (BENADRYL) 12.5 MG/5ML elixir 12.5 mg (12.5 mg Oral Given 12/24/14 1926)    BP 158/93 mmHg  Pulse 106  Temp(Src) 97.8 F (36.6 C) (Tympanic)  Resp 16  Ht 5' (1.524 m)  Wt 138 lb (62.596 kg)  BMI 26.95 kg/m2  SpO2 99%  LMP  (LMP Unknown) No data found.   Physical Exam  Constitutional: She is oriented to person, place, and time. She appears well-developed and well-nourished. She is active and cooperative.  Non-toxic appearance. She does not have a sickly appearance. She appears ill. No distress.  HENT:  Head: Normocephalic and  atraumatic.  Right Ear: Hearing, external ear and ear canal normal. A middle ear effusion is present.  Left Ear: Hearing, external ear and ear canal normal. A middle ear effusion is present.  Nose: Mucosal edema and rhinorrhea present. No nose lacerations, sinus tenderness, nasal deformity, septal deviation or nasal septal hematoma. No epistaxis.  No foreign bodies. Right sinus exhibits maxillary sinus tenderness and frontal sinus tenderness. Left sinus exhibits maxillary sinus tenderness and frontal sinus tenderness.  Mouth/Throat: Uvula is midline and mucous membranes are normal. Mucous membranes are not pale, not dry and not cyanotic. She does not have dentures. No oral lesions. No trismus in the jaw. Normal dentition. No dental abscesses, uvula swelling, lacerations or dental caries. Posterior oropharyngeal edema and posterior oropharyngeal erythema present. No oropharyngeal exudate or  tonsillar abscesses.  Tonsils 4+ almost touching behind uvula no exudate erythema; cobblestoning posterior pharynx; edema/erythema nasal turbinates bilateral clear discharge; TMs with air fluid level clear  Eyes: Conjunctivae, EOM and lids are normal. Pupils are equal, round, and reactive to light. Right eye exhibits no chemosis, no discharge, no exudate and no hordeolum. No foreign body present in the right eye. Left eye exhibits no chemosis, no discharge, no exudate and no hordeolum. No foreign body present in the left eye. Right conjunctiva is not injected. Right conjunctiva has no hemorrhage. Left conjunctiva is not injected. Left conjunctiva has no hemorrhage. No scleral icterus. Right eye exhibits normal extraocular motion and no nystagmus. Left eye exhibits normal extraocular motion and no nystagmus. Right pupil is round and reactive. Left pupil is round and reactive. Pupils are equal.  Neck: Trachea normal and normal range of motion. Neck supple. No tracheal tenderness, no spinous process tenderness and no muscular tenderness present. No rigidity. No tracheal deviation, no edema, no erythema and normal range of motion present. No thyroid mass and no thyromegaly present.  Cardiovascular: Normal rate, regular rhythm, S1 normal, S2 normal, normal heart sounds and intact distal pulses.  PMI is not displaced.  Exam reveals no gallop and no friction rub.   No murmur heard. Pulmonary/Chest: Effort normal and breath sounds normal. No accessory muscle usage or stridor. No respiratory distress. She has no decreased breath sounds. She has no wheezes. She has no rhonchi. She has no rales. She exhibits no tenderness.  Abdominal: Soft. She exhibits no distension.  Musculoskeletal: Normal range of motion. She exhibits no edema or tenderness.       Right shoulder: Normal.       Left shoulder: Normal.       Right hip: Normal.       Left hip: Normal.       Right knee: Normal.       Left knee: Normal.        Cervical back: Normal.       Right hand: Normal.       Left hand: Normal.  Lymphadenopathy:       Head (right side): No submental, no submandibular, no tonsillar, no preauricular, no posterior auricular and no occipital adenopathy present.       Head (left side): No submental, no submandibular, no tonsillar, no preauricular, no posterior auricular and no occipital adenopathy present.    She has no cervical adenopathy.       Right cervical: No superficial cervical, no deep cervical and no posterior cervical adenopathy present.      Left cervical: No superficial cervical, no deep cervical and no posterior cervical adenopathy present.  Neurological: She is alert and  oriented to person, place, and time. She has normal strength. She is not disoriented. She displays no atrophy and no tremor. No cranial nerve deficit or sensory deficit. She exhibits normal muscle tone. She displays no seizure activity. Coordination and gait normal. GCS eye subscore is 4. GCS verbal subscore is 5. GCS motor subscore is 6.  Skin: Skin is warm, dry and intact. No abrasion, no bruising, no burn, no ecchymosis, no laceration, no lesion, no petechiae and no rash noted. She is not diaphoretic. No cyanosis or erythema. No pallor. Nails show no clubbing.  Psychiatric: She has a normal mood and affect. Her speech is normal and behavior is normal. Judgment and thought content normal. Cognition and memory are normal.  Nursing note and vitals reviewed.   ED Course  Procedures (including critical care time)  Labs Review Labs Reviewed  RAPID STREP SCREEN (NOT AT [facility])  CULTURE, GROUP A STREP ([facility] ONLY)    Imaging Review No results found.  1945 patient notified rapid strep negative will call with throat culture results this weekend.  Stop bactrim and augmentin.  Discussed penicillins with viral illness can cause rash.  Refused nose sprays.  Will try prednisone and benadryl.  Hydrate hydrate hydrate as dehydrated dry mucous  membranes.  Feeling a little better tired and wants to go home and eat dinner/rest.  Discussed if dyspnea, throat/lip swelling, chest pain to go to ER tonight for re-evaluation.  Patient verbalized understanding of information/instructions, agreed with plan of care and had no further questions at this time. Filed Vitals:   12/24/14 1950  BP: 158/93  Pulse: 106  Temp:   Resp:     MDM   1. Acute frontal sinusitis, recurrence not specified   2. Medication reaction, initial encounter   3. Otitis media with effusion, bilateral   4. Acute tonsillitis, unspecified etiology    Patient notified rapid strep negative.  Suspect Viral illness: no evidence of invasive bacterial infection, non toxic and well hydrated.  This is most likely self limiting viral infection.  Post nasal drip causing throat and tonsil irritation.     I will suggest supportive care, rest, good hygiene and encourage the patient to take adequate fluids.  Does not require work excuse.  Notified patient staff will call with culture results once available next 48+ hours.  Tylenol 1000 mg po qID prn.  Discussed honey with lemon and salt water gargles for comfort also. Prednisone 40mg  po daily x 4 days.  Continue to monitor blood sugars daily. Discussed with patient can elevate blood sugars to stay hydrated as oral mucosa dry today.  Benadryl 25-50mg  po gargle and swallow TID. Caution as benadryl can cause sedation or hyperactivity.  Avoid concentrated sugars while on prednisone.  The patient is to return to clinic or EMERGENCY ROOM if symptoms worsen or change significantly e.g. fever, lethargy, SOB, wheezing.  Exitcare handout on viral illness given to patient.  Patient verbalized agreement and understanding of treatment plan.   Benadryl 12.5-25mg  po TID prn gargle and swallow.  Tylenol 1000mg  po QID prn pain. Will call with culture results when available typically 48 hours.   Usually no specific medical treatment is needed if a virus is  causing the sore throat.  The throat most often gets better on its own within 5 to 7 days.  Antibiotic medicine does not cure viral pharyngitis.   For acute pharyngitis caused by bacteria, your healthcare provider will prescribe an antibiotic.  Marland Kitchen Do not smoke.  Marland Kitchen Avoid  secondhand smoke and other air pollutants.  . Use a cool mist humidifier to add moisture to the air.  . Get plenty of rest.  . You may want to rest your throat by talking less and eating a diet that is mostly liquid or soft for a day or two.   Marland Kitchen Nonprescription throat lozenges and mouthwashes should help relieve the soreness.   . Gargling with warm saltwater and drinking warm liquids may help.  (You can make a saltwater solution by adding 1/4 teaspoon of salt to 8 ounces, or 240 mL, of warm water.)  . A nonprescription pain reliever such as aspirin, acetaminophen, or ibuprofen may ease general aches and pains.   FOLLOW UP with clinic provider if no improvements in the next 7-10 days.  Patient verbalized understanding of instructions and agreed with plan of care. P2:  Hand washing and diet.  Supportive treatment.   No evidence of invasive bacterial infection, non toxic and well hydrated.  This is most likely self limiting viral infection.  I do not see where any further testing or imaging is necessary at this time.   I will suggest supportive care, rest, good hygiene and encourage the patient to take adequate fluids.  The patient is to return to clinic or EMERGENCY ROOM if symptoms worsen or change significantly e.g. ear pain, fever, purulent discharge from ears or bleeding.  Exitcare handout on otitis media with effusion given to patient.  Patient verbalized agreement and understanding of treatment plan.    Showering to remove dust, pollen from skin, steam to help open nasal passages twice a day.  Rx prednisone  po daily with breakfast start tomorrow given along with benadryl  po TIDNo evidence of systemic bacterial infection,  non toxic and well hydrated.  I do not see where any further testing or imaging is necessary at this time.   I will suggest supportive care, rest, good hygiene and encourage the patient to take adequate fluids.  The patient is to return to clinic or EMERGENCY ROOM if symptoms worsen or change significantly.  Exitcare handout on sinusitis given to patient.  Patient verbalized agreement and understanding of treatment plan and had no further questions at this time.   P2:  Hand washing and cover cough   Barbaraann Barthel, NP 12/25/14 1852  29 Dec 2014 at 1622 Patient contacted via telephone notified throat culture normal/negative.  Patient reported symptoms have resolved but the day after clinic visit had rash  On tongue and children at her workplace were diagnosed with hand foot and mouth disease and she thinks she had it also.  Patient finished her prednisone and is asymptomatic at this time.  Patient verbalized understanding of information and had no further questions at this time.  Barbaraann Barthel, NP 12/29/14 1624

## 2014-12-24 NOTE — Discharge Instructions (Signed)
Viral Infections A virus is a type of germ. Viruses can cause:  Minor sore throats.  Aches and pains.  Headaches.  Runny nose.  Rashes.  Watery eyes.  Tiredness.  Coughs.  Loss of appetite.  Feeling sick to your stomach (nausea).  Throwing up (vomiting).  Watery poop (diarrhea). HOME CARE   Only take medicines as told by your doctor.  Drink enough water and fluids to keep your pee (urine) clear or pale yellow. Sports drinks are a good choice.  Get plenty of rest and eat healthy. Soups and broths with crackers or rice are fine. GET HELP RIGHT AWAY IF:   You have a very bad headache.  You have shortness of breath.  You have chest pain or neck pain.  You have an unusual rash.  You cannot stop throwing up.  You have watery poop that does not stop.  You cannot keep fluids down.  You or your child has a temperature by mouth above 102 F (38.9 C), not controlled by medicine.  Your baby is older than 3 months with a rectal temperature of 102 F (38.9 C) or higher.  Your baby is 72 months old or younger with a rectal temperature of 100.4 F (38 C) or higher. MAKE SURE YOU:   Understand these instructions.  Will watch this condition.  Will get help right away if you are not doing well or get worse.   This information is not intended to replace advice given to you by your health care provider. Make sure you discuss any questions you have with your health care provider.   Document Released: 01/12/2008 Document Revised: 04/23/2011 Document Reviewed: 07/07/2014 Elsevier Interactive Patient Education 2016 [employer]. Drug Allergy Allergic reactions to medicines are common. Some allergic reactions are mild. A delayed type of drug allergy that occurs 1 week or more after exposure to a medicine or vaccine is called serum sickness. A life-threatening, sudden (acute) allergic reaction that involves the whole body is called anaphylaxis. CAUSES  "True" drug  allergies occur when there is an allergic reaction to a medicine. This is caused by overactivity of the immune system. First, the body becomes sensitized. The immune system is triggered by your first exposure to the medicine. Following this first exposure, future exposure to the same medicine may be life-threatening. Almost any medicine can cause an allergic reaction. Common ones are:  Penicillin.  Sulfonamides (sulfa drugs).  Local anesthetics.  X-ray dyes that contain iodine. SYMPTOMS  Common symptoms of a minor allergic reaction are:  Swelling around the mouth.  An itchy red rash or hives.  Vomiting or diarrhea. Anaphylaxis can cause swelling of the mouth and throat. This makes it difficult to breathe and swallow. Severe reactions can be fatal within seconds, even after exposure to only a trace amount of the drug that causes the reaction. HOME CARE INSTRUCTIONS  If you are unsure of what caused your reaction, write down:  The names of the medicines you took.  How much medicine you took.  How you took the medicine, such as whether you took a pill, injected the medicine, or applied it to your skin.  All of the things you ate and drank.  The date and time of your reaction.  The symptoms of the reaction.  You may want to follow up with an allergy specialist after the reaction has cleared in order to be tested to confirm the allergy. It is important to confirm that your reaction is an allergy, not just  a side effect to the medicine. If you have a true allergy to a medicine, this may prevent that medicine and related medicines from being given to you when you are very ill.  If you have hives or a rash:  Take medicines as directed by your caregiver.  You may use an over-the-counter antihistamine (diphenhydramine) as needed.  Apply cold compresses to the skin or take baths in cool water. Avoid hot baths or showers.  If you are severely allergic:  Continuous observation after  a severe reaction may be needed. Hospitalization is often required.  Wear a medical alert bracelet or necklace stating your allergy.  You and your family must learn how to use an anaphylaxis kit or give an epinephrine injection to temporarily treat an emergency allergic reaction. If you have had a severe reaction, always carry your epinephrine injection or anaphylaxis kit with you. This can be lifesaving if you have a severe reaction.  Do not drive or perform tasks after treatment until the medicines used to treat your reaction have worn off, or until your caregiver says it is okay.  If you have a drug allergy that was confirmed by your health care provider:  Carry information about the drug allergy with you at all times.  Always check with a pharmacist before taking any over-the-counter medicine. SEEK MEDICAL CARE IF:   You think you had an allergic reaction. Symptoms usually start within 30 minutes after exposure.  Symptoms are getting worse rather than better.  You develop new symptoms.  The symptoms that brought you to your caregiver return. SEEK IMMEDIATE MEDICAL CARE IF:   You have swelling of the mouth, difficulty breathing, or wheezing.  You have a tight feeling in your chest or throat.  You develop hives, swelling, or itching all over your body.  You develop severe vomiting or diarrhea.  You feel faint or pass out. This is an emergency. Use your epinephrine injection or anaphylaxis kit as you have been instructed. Call for emergency medical help. Even if you improve after the injection, you need to be examined at a hospital emergency department. MAKE SURE YOU:   Understand these instructions.  Will watch your condition.  Will get help right away if you are not doing well or get worse.   This information is not intended to replace advice given to you by your health care provider. Make sure you discuss any questions you have with your health care provider.     Document Released: 01/29/2005 Document Revised: 02/19/2014 Document Reviewed: 08/31/2014 Elsevier Interactive Patient Education 2016 [employer]. Otitis Media With Effusion Otitis media with effusion is the presence of fluid in the middle ear. This is a common problem in children, which often follows ear infections. It may be present for weeks or longer after the infection. Unlike an acute ear infection, otitis media with effusion refers only to fluid behind the ear drum and not infection. Children with repeated ear and sinus infections and allergy problems are the most likely to get otitis media with effusion. CAUSES  The most frequent cause of the fluid buildup is dysfunction of the eustachian tubes. These are the tubes that drain fluid in the ears to the back of the nose (nasopharynx). SYMPTOMS   The main symptom of this condition is hearing loss. As a result, you or your child may:  Listen to the TV at a loud volume.  Not respond to questions.  Ask "what" often when spoken to.  Mistake or confuse one  sound or word for another.  There may be a sensation of fullness or pressure but usually not pain. DIAGNOSIS   Your health care provider will diagnose this condition by examining you or your child's ears.  Your health care provider may test the pressure in you or your child's ear with a tympanometer.  A hearing test may be conducted if the problem persists. TREATMENT   Treatment depends on the duration and the effects of the effusion.  Antibiotics, decongestants, nose drops, and cortisone-type drugs (tablets or nasal spray) may not be helpful.  Children with persistent ear effusions may have delayed language or behavioral problems. Children at risk for developmental delays in hearing, learning, and speech may require referral to a specialist earlier than children not at risk.  You or your child's health care provider may suggest a referral to an ear, nose, and throat surgeon for  treatment. The following may help restore normal hearing:  Drainage of fluid.  Placement of ear tubes (tympanostomy tubes).  Removal of adenoids (adenoidectomy). HOME CARE INSTRUCTIONS   Avoid secondhand smoke.  Infants who are breastfed are less likely to have this condition.  Avoid feeding infants while they are lying flat.  Avoid known environmental allergens.  Avoid people who are sick. SEEK MEDICAL CARE IF:   Hearing is not better in 3 months.  Hearing is worse.  Ear pain.  Drainage from the ear.  Dizziness. MAKE SURE YOU:   Understand these instructions.  Will watch your condition.  Will get help right away if you are not doing well or get worse.   This information is not intended to replace advice given to you by your health care provider. Make sure you discuss any questions you have with your health care provider.   Document Released: 03/08/2004 Document Revised: 02/19/2014 Document Reviewed: 08/26/2012 Elsevier Interactive Patient Education 2016 [employer] Sinusitis, Adult Sinusitis is redness, soreness, and inflammation of the paranasal sinuses. Paranasal sinuses are air pockets within the bones of your face. They are located beneath your eyes, in the middle of your forehead, and above your eyes. In healthy paranasal sinuses, mucus is able to drain out, and air is able to circulate through them by way of your nose. However, when your paranasal sinuses are inflamed, mucus and air can become trapped. This can allow bacteria and other germs to grow and cause infection. Sinusitis can develop quickly and last only a short time (acute) or continue over a long period (chronic). Sinusitis that lasts for more than 12 weeks is considered chronic. CAUSES Causes of sinusitis include:  Allergies.  Structural abnormalities, such as displacement of the cartilage that separates your nostrils (deviated septum), which can decrease the air flow through your nose and  sinuses and affect sinus drainage.  Functional abnormalities, such as when the small hairs (cilia) that line your sinuses and help remove mucus do not work properly or are not present. SIGNS AND SYMPTOMS Symptoms of acute and chronic sinusitis are the same. The primary symptoms are pain and pressure around the affected sinuses. Other symptoms include:  Upper toothache.  Earache.  Headache.  Bad breath.  Decreased sense of smell and taste.  A cough, which worsens when you are lying flat.  Fatigue.  Fever.  Thick drainage from your nose, which often is green and may contain pus (purulent).  Swelling and warmth over the affected sinuses. DIAGNOSIS Your health care provider will perform a physical exam. During your exam, your health care provider may perform  any of the following to help determine if you have acute sinusitis or chronic sinusitis:  Look in your nose for signs of abnormal growths in your nostrils (nasal polyps).  Tap over the affected sinus to check for signs of infection.  View the inside of your sinuses using an imaging device that has a light attached (endoscope). If your health care provider suspects that you have chronic sinusitis, one or more of the following tests may be recommended:  Allergy tests.  Nasal culture. A sample of mucus is taken from your nose, sent to a lab, and screened for bacteria.  Nasal cytology. A sample of mucus is taken from your nose and examined by your health care provider to determine if your sinusitis is related to an allergy. TREATMENT Most cases of acute sinusitis are related to a viral infection and will resolve on their own within 10 days. Sometimes, medicines are prescribed to help relieve symptoms of both acute and chronic sinusitis. These may include pain medicines, decongestants, nasal steroid sprays, or saline sprays. However, for sinusitis related to a bacterial infection, your health care provider will prescribe  antibiotic medicines. These are medicines that will help kill the bacteria causing the infection. Rarely, sinusitis is caused by a fungal infection. In these cases, your health care provider will prescribe antifungal medicine. For some cases of chronic sinusitis, surgery is needed. Generally, these are cases in which sinusitis recurs more than 3 times per year, despite other treatments. HOME CARE INSTRUCTIONS  Drink plenty of water. Water helps thin the mucus so your sinuses can drain more easily.  Use a humidifier.  Inhale steam 3-4 times a day (for example, sit in the bathroom with the shower running).  Apply a warm, moist washcloth to your face 3-4 times a day, or as directed by your health care provider.  Use saline nasal sprays to help moisten and clean your sinuses.  Take medicines only as directed by your health care provider.  If you were prescribed either an antibiotic or antifungal medicine, finish it all even if you start to feel better. SEEK IMMEDIATE MEDICAL CARE IF:  You have increasing pain or severe headaches.  You have nausea, vomiting, or drowsiness.  You have swelling around your face.  You have vision problems.  You have a stiff neck.  You have difficulty breathing.   This information is not intended to replace advice given to you by your health care provider. Make sure you discuss any questions you have with your health care provider.   Document Released: 01/29/2005 Document Revised: 02/19/2014 Document Reviewed: 02/13/2011 Elsevier Interactive Patient Education 2016 [employer] Tonsillitis Tonsillitis is an infection of the throat that causes the tonsils to become red, tender, and swollen. Tonsils are collections of lymphoid tissue at the back of the throat. Each tonsil has crevices (crypts). Tonsils help fight nose and throat infections and keep infection from spreading to other parts of the body for the first 18 months of life.  CAUSES Sudden (acute)  tonsillitis is usually caused by infection with streptococcal bacteria. Long-lasting (chronic) tonsillitis occurs when the crypts of the tonsils become filled with pieces of food and bacteria, which makes it easy for the tonsils to become repeatedly infected. SYMPTOMS  Symptoms of tonsillitis include:  A sore throat, with possible difficulty swallowing.  White patches on the tonsils.  Fever.  Tiredness.  New episodes of snoring during sleep, when you did not snore before.  Small, foul-smelling, yellowish-white pieces of material (tonsilloliths) that  you occasionally cough up or spit out. The tonsilloliths can also cause you to have bad breath. DIAGNOSIS Tonsillitis can be diagnosed through a physical exam. Diagnosis can be confirmed with the results of lab tests, including a throat culture. TREATMENT  The goals of tonsillitis treatment include the reduction of the severity and duration of symptoms and prevention of associated conditions. Symptoms of tonsillitis can be improved with the use of steroids to reduce the swelling. Tonsillitis caused by bacteria can be treated with antibiotic medicines. Usually, treatment with antibiotic medicines is started before the cause of the tonsillitis is known. However, if it is determined that the cause is not bacterial, antibiotic medicines will not treat the tonsillitis. If attacks of tonsillitis are severe and frequent, your health care provider may recommend surgery to remove the tonsils (tonsillectomy). HOME CARE INSTRUCTIONS   Rest as much as possible and get plenty of sleep.  Drink plenty of fluids. While the throat is very sore, eat soft foods or liquids, such as sherbet, soups, or instant breakfast drinks.  Eat frozen ice pops.  Gargle with a warm or cold liquid to help soothe the throat. Mix 1/4 teaspoon of salt and 1/4 teaspoon of baking soda in 8 oz of water. SEEK MEDICAL CARE IF:   Large, tender lumps develop in your neck.  A rash  develops.  A green, yellow-brown, or bloody substance is coughed up.  You are unable to swallow liquids or food for 24 hours.  You notice that only one of the tonsils is swollen. SEEK IMMEDIATE MEDICAL CARE IF:   You develop any new symptoms such as vomiting, severe headache, stiff neck, chest pain, or trouble breathing or swallowing.  You have severe throat pain along with drooling or voice changes.  You have severe pain, unrelieved with recommended medications.  You are unable to fully open the mouth.  You develop redness, swelling, or severe pain anywhere in the neck.  You have a fever. MAKE SURE YOU:   Understand these instructions.  Will watch your condition.  Will get help right away if you are not doing well or get worse.   This information is not intended to replace advice given to you by your health care provider. Make sure you discuss any questions you have with your health care provider.   Document Released: 11/08/2004 Document Revised: 02/19/2014 Document Reviewed: 07/18/2012 Elsevier Interactive Patient Education [employer].

## 2014-12-24 NOTE — ED Notes (Signed)
Patient states that she was seen on Tuesday for sinusitis and was started on Augmentin.  Patient states she then developed hives after taking the Augmentin.  Patient called here today and another antibiotic was called in for her and she states that after she started the new antibiotic her throat started hurting and developed a headache.

## 2014-12-27 LAB — CULTURE, GROUP A STREP (THRC)

## 2015-01-05 ENCOUNTER — Other Ambulatory Visit: Payer: Self-pay | Admitting: Unknown Physician Specialty

## 2015-01-15 ENCOUNTER — Ambulatory Visit
Admission: EM | Admit: 2015-01-15 | Discharge: 2015-01-15 | Disposition: A | Discharge status: Home / Self Care | Payer: BLUE CROSS/BLUE SHIELD | Attending: Family Medicine | Admitting: Family Medicine

## 2015-01-15 DIAGNOSIS — H109 Unspecified conjunctivitis: Secondary | ICD-10-CM | POA: Diagnosis not present

## 2015-01-15 MED ORDER — POLYMYXIN B-TRIMETHOPRIM 10000-0.1 UNIT/ML-% OP SOLN: 1.0000 [drp] | Freq: Four times a day (QID) | OPHTHALMIC | Status: AC

## 2015-01-15 NOTE — Discharge Instructions (Signed)

## 2015-01-15 NOTE — ED Notes (Signed)
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 She states that she noticed itching in her eye yesterday. Patient states that when she woke up this morning her left eye was matted, crusted, tearing and slightly itchy today.

## 2015-01-15 NOTE — ED Provider Notes (Signed)
CSN: [id number]     Arrival date & time 01/15/15  0804 History   First MD Initiated Contact with Patient 01/15/15 [phone]     Chief Complaint  Patient presents with  . Conjunctivitis   HPI  Tracy Bryan is a pleasant 54 y.o. female who presents with left eye redness and discharge. She states this started yesterday afternoon. She felt like sandpaper in her left eye. She states it is pruritic. She rates her discomfort 1/10 on pain scale. 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 She denies any known injury. She denies any visual changes. Denies any fever or chills. She has had a slight nonproductive cough. Denies any headaches or otalgia. She has noticed crusting and yellowish discharge from her left eye. She has no history of eye disease and does not wear contact lenses. She does have reading glasses.   Past Medical History  Diagnosis Date  . PONV (postoperative nausea and vomiting)   . Hypertension   . Pneumonia   . Diabetes mellitus without complication (HCC)     Type 2  . GERD (gastroesophageal reflux disease)   . Arthritis   . Hypercholesteremia   . Disseminated intravascular coagulation (HCC) 1999    After child birth  . Herpes simplex   . Migraines    Past Surgical History  Procedure Laterality Date  . Hip arthroplasty Right   . Cervical disc surgery    . 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 Hernia repair  2000    Umblical  . Spine surgery    . Dilation and curettage of uterus  1996  . Joint replacement     Family History  Problem Relation Age of Onset  . Hypertension Mother   . Stroke Mother   . Hyperlipidemia Mother   . Heart disease Mother     heart attack  . Diabetes Father   . Cancer Father     lung  . Diabetes Sister   . Hyperlipidemia Sister   . Stroke Paternal  Grandfather   . Hypertension Paternal Grandfather   . Stroke Maternal Grandfather   . Alzheimer's disease Paternal Grandmother   . Diabetes Sister   . Cancer Sister     breast   Social History  Substance Use Topics  . Smoking status: Former Smoker    Quit date: 06/14/1984  . Smokeless tobacco: Never Used  . Alcohol Use: No   OB History    No data available     Review of Systems  Constitutional: Negative.   HENT: Negative.   Eyes: Positive for pain, discharge, redness and itching. Negative for photophobia and visual disturbance.  Respiratory: Positive for cough. Negative for chest tightness, shortness of breath and wheezing.   Cardiovascular: Negative.   Gastrointestinal: Negative.   Endocrine: Negative.   Genitourinary: Negative.   Skin: Negative.   Neurological: Negative.   Hematological: Negative.   Psychiatric/Behavioral: Negative.     Allergies  Augmentin; Benazepril hcl; and Penicillins  Home Medications   Prior to Admission medications   Medication Sig Start Date End Date Taking? Authorizing Provider  acetaminophen (TYLENOL) 500 MG tablet Take 2 tablets (1,000 mg total) by mouth once. 12/24/14  Yes Barbaraann Barthelina A Betancourt, NP  aspirin EC 325 MG EC tablet Take 1 tablet (325 mg total) by mouth 2 (  two) times daily after a meal. 05/20/14  Yes Elodia Florence, PA-C  chlorthalidone (HYGROTON) 25 MG tablet Take 1 tablet (25 mg total) by mouth daily. 08/17/14  Yes Megan P Johnson, DO  CRANBERRY PO Take 4,200 mg by mouth daily.   Yes Historical Provider, MD  DULoxetine (CYMBALTA) 60 MG capsule TAKE 1 CAPSULE BY MOUTH DAILY 01/05/15  Yes Gabriel Cirri, NP  estradiol (ESTRACE) 0.5 MG tablet Take 1 tablet (0.5 mg total) by mouth daily. 07/28/14  Yes Gabriel Cirri, NP  fenofibrate micronized (LOFIBRA) 134 MG capsule Take 1 capsule (134 mg total) by mouth daily. 07/28/14  Yes Gabriel Cirri, NP  FIBER PO Take 1 tablet by mouth every other day.   Yes Historical Provider, MD  glucose blood test  strip 1 each by Other route daily. Use as instructed   Yes Historical Provider, MD  lansoprazole (PREVACID) 30 MG capsule Take 1 capsule (30 mg total) by mouth daily at 12 noon. 10/11/14  Yes Gabriel Cirri, NP  metFORMIN (GLUCOPHAGE) 500 MG tablet Take 1 tablet (500 mg total) by mouth 2 (two) times daily with a meal. 12/03/14  Yes Megan P Johnson, DO  metoprolol (LOPRESSOR) 50 MG tablet Take 1 tablet (50 mg total) by mouth daily. 07/28/14  Yes Gabriel Cirri, NP  Multiple Vitamins-Minerals (MULTIVITAMIN WITH MINERALS) tablet Take 1 tablet by mouth daily.   Yes Historical Provider, MD  POTASSIUM PO Take 1 tablet by mouth daily.   Yes Historical Provider, MD  simvastatin (ZOCOR) 40 MG tablet Take 1 tablet (40 mg total) by mouth at bedtime. 07/28/14  Yes Gabriel Cirri, NP  chlorpheniramine-HYDROcodone (TUSSIONEX PENNKINETIC ER) 10-8 MG/5ML SUER Take 5 mLs by mouth 2 (two) times daily. 12/21/14   Lutricia Feil, PA-C  diphenhydrAMINE (BENADRYL) 12.5 MG/5ML elixir Take 10 mLs (25 mg total) by mouth every 8 (eight) hours as needed for itching or sleep. 12/24/14   Barbaraann Barthel, NP  sitaGLIPtin (JANUVIA) 100 MG tablet Take 1 tablet (100 mg total) by mouth daily. 07/28/14   Gabriel Cirri, NP  trimethoprim-polymyxin b (POLYTRIM) ophthalmic solution Place 1 drop into the left eye every 6 (six) hours. 01/15/15 01/20/15  Joselyn Arrow, NP   Meds Ordered and Administered this Visit  Medications - No data to display  BP 152/94 mmHg  Pulse 78  Temp(Src) 97.9 F (36.6 C) (Tympanic)  Resp 16  Ht 5' (1.524 m)  Wt 132 lb (59.875 kg)  BMI 25.78 kg/m2  SpO2 96%  LMP  (LMP Unknown) No data found.   Physical Exam  Constitutional: She is oriented to person, place, and time. She appears well-developed and well-nourished. No distress.  HENT:  Head: Normocephalic and atraumatic.  Right Ear: Hearing, tympanic membrane, external ear and ear canal normal.  Left Ear: Hearing, tympanic membrane, external ear and  ear canal normal.  Nose: Nose normal. Right sinus exhibits no maxillary sinus tenderness and no frontal sinus tenderness. Left sinus exhibits no maxillary sinus tenderness and no frontal sinus tenderness.  Mouth/Throat: Uvula is midline and oropharynx is clear and moist.  Eyes: EOM and lids are normal. Pupils are equal, round, and reactive to light. Right conjunctiva is not injected. Left conjunctiva is injected. Left conjunctiva has no hemorrhage. No scleral icterus.  Fundoscopic exam:      The right eye shows no arteriolar narrowing, no exudate, no hemorrhage and no papilledema.       The left eye shows no arteriolar narrowing, no exudate, no hemorrhage and no  papilledema.  Neck: Normal range of motion.  Cardiovascular: Normal rate and regular rhythm.   Pulmonary/Chest: Effort normal and breath sounds normal. No respiratory distress.  Abdominal: Soft. Bowel sounds are normal. She exhibits no distension.  Musculoskeletal: Normal range of motion. She exhibits no edema or tenderness.  Neurological: She is alert and oriented to person, place, and time. No cranial nerve deficit.  Skin: Skin is warm and dry. No rash noted. No erythema.  Psychiatric: Her behavior is normal. Judgment normal.  Nursing note and vitals reviewed.   ED Course  Procedures  Visual Acuity Review  Right Eye Distance: 20/50 Left Eye Distance: 20/50 Bilateral Distance: 20/50  MDM   1. Conjunctivitis of left eye    Plan: Diagnosis reviewed with patient Rx as per orders;  benefits, risks, potential side effects reviewed  Recommend supportive treatment with rest, warm compresses Seek additional medical care if symptoms worsen or are not improving with PCP in 2-3 days.    Joselyn Arrow, NP 01/15/15 [phone]

## 2015-01-28 ENCOUNTER — Other Ambulatory Visit: Payer: Self-pay | Admitting: Unknown Physician Specialty

## 2015-01-31 ENCOUNTER — Ambulatory Visit: Payer: BLUE CROSS/BLUE SHIELD | Admitting: Unknown Physician Specialty

## 2015-02-03 NOTE — ED Provider Notes (Signed)
CSN: [id number]     Arrival date & time 12/21/14  1021 History   First MD Initiated Contact with Patient 12/21/14 1153     Chief Complaint  Patient presents with  . Headache  . Nasal Congestion  . Facial Pain   (Consider location/radiation/quality/duration/timing/severity/associated sxs/prior Treatment) HPI Comments: 54 yo female with a 3 weeks h/o nasal congestion, sinus pressure, headaches, fatigue. Denies chest pains or shortness of breath.  Patient is a 54 y.o. female presenting with headaches. The history is provided by the patient.  Headache   Past Medical History  Diagnosis Date  . PONV (postoperative nausea and vomiting)   . Hypertension   . Pneumonia   . Diabetes mellitus without complication (HCC)     Type 2  . GERD (gastroesophageal reflux disease)   . Arthritis   . Hypercholesteremia   . Disseminated intravascular coagulation (HCC) 1999    After child birth  . Herpes simplex   . Migraines    Past Surgical History  Procedure Laterality Date  . Hip arthroplasty Right   . Cervical disc surgery    . 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 Hernia repair  2000    Umblical  . Spine surgery    . Dilation and curettage of uterus  1996  . Joint replacement     Family History  Problem Relation Age of Onset  . Hypertension Mother   . Stroke Mother   . Hyperlipidemia Mother   . Heart disease Mother     heart attack  . Diabetes Father   . Cancer Father     lung  . Diabetes Sister   . Hyperlipidemia Sister   . Stroke Paternal Grandfather   . Hypertension Paternal Grandfather   . Stroke Maternal Grandfather   . Alzheimer's disease Paternal Grandmother   . Diabetes Sister   . Cancer Sister     breast   Social History  Substance Use Topics  . Smoking status: Former Smoker    Quit date: 06/14/1984  .  Smokeless tobacco: Never Used  . Alcohol Use: No   OB History    No data available     Review of Systems  Neurological: Positive for headaches.    Allergies  Augmentin; Benazepril hcl; and Penicillins  Home Medications   Prior to Admission medications   Medication Sig Start Date End Date Taking? Authorizing Provider  acetaminophen (TYLENOL) 500 MG tablet Take 2 tablets (1,000 mg total) by mouth once. 12/24/14   Barbaraann Barthel, NP  aspirin EC 325 MG EC tablet Take 1 tablet (325 mg total) by mouth 2 (two) times daily after a meal. 05/20/14   Elodia Florence, PA-C  chlorpheniramine-HYDROcodone (TUSSIONEX PENNKINETIC ER) 10-8 MG/5ML SUER Take 5 mLs by mouth 2 (two) times daily. 12/21/14   Lutricia Feil, PA-C  chlorthalidone (HYGROTON) 25 MG tablet Take 1 tablet (25 mg total) by mouth daily. 08/17/14   Megan P Johnson, DO  CRANBERRY PO Take 4,200 mg by mouth daily.    Historical Provider, MD  diphenhydrAMINE (BENADRYL) 12.5 MG/5ML elixir Take 10 mLs (25 mg total) by mouth every 8 (eight) hours as needed for itching or sleep. 12/24/14   Barbaraann Barthel, NP  DULoxetine (CYMBALTA) 60 MG capsule TAKE 1 CAPSULE BY MOUTH DAILY 01/05/15   Gabriel Cirri, NP  estradiol (ESTRACE) 0.5 MG  tablet Take 1 tablet (0.5 mg total) by mouth daily. 07/28/14   Gabriel Cirriheryl Wicker, NP  fenofibrate micronized (LOFIBRA) 134 MG capsule TAKE 1 CAPSULE (134 MG TOTAL) BY MOUTH DAILY. 01/28/15   Megan P Johnson, DO  FIBER PO Take 1 tablet by mouth every other day.    Historical Provider, MD  glucose blood test strip 1 each by Other route daily. Use as instructed    Historical Provider, MD  lansoprazole (PREVACID) 30 MG capsule Take 1 capsule (30 mg total) by mouth daily at 12 noon. 10/11/14   Gabriel Cirriheryl Wicker, NP  metFORMIN (GLUCOPHAGE) 500 MG tablet Take 1 tablet (500 mg total) by mouth 2 (two) times daily with a meal. 12/03/14   Megan P Johnson, DO  metoprolol (LOPRESSOR) 50 MG tablet TAKE 1 TABLET (50 MG TOTAL) BY MOUTH DAILY.  01/28/15   Megan P Johnson, DO  Multiple Vitamins-Minerals (MULTIVITAMIN WITH MINERALS) tablet Take 1 tablet by mouth daily.    Historical Provider, MD  POTASSIUM PO Take 1 tablet by mouth daily.    Historical Provider, MD  simvastatin (ZOCOR) 40 MG tablet Take 1 tablet (40 mg total) by mouth at bedtime. 07/28/14   Gabriel Cirriheryl Wicker, NP  sitaGLIPtin (JANUVIA) 100 MG tablet Take 1 tablet (100 mg total) by mouth daily. 07/28/14   Gabriel Cirriheryl Wicker, NP   Meds Ordered and Administered this Visit  Medications - No data to display  BP 142/95 mmHg  Pulse 97  Temp(Src) 98.5 F (36.9 C) (Tympanic)  Resp 18  Ht 5\' 1"  (1.549 m)  Wt 138 lb (62.596 kg)  BMI 26.09 kg/m2  SpO2 99%  LMP  (LMP Unknown) No data found.   Physical Exam  Constitutional: She appears well-developed and well-nourished. No distress.  HENT:  Head: Normocephalic and atraumatic.  Right Ear: Tympanic membrane, external ear and ear canal normal.  Left Ear: Tympanic membrane, external ear and ear canal normal.  Nose: Mucosal edema and rhinorrhea present. No nose lacerations, sinus tenderness, nasal deformity, septal deviation or nasal septal hematoma. No epistaxis.  No foreign bodies. Right sinus exhibits maxillary sinus tenderness and frontal sinus tenderness. Left sinus exhibits maxillary sinus tenderness and frontal sinus tenderness.  Mouth/Throat: Uvula is midline, oropharynx is clear and moist and mucous membranes are normal. No oropharyngeal exudate.  Eyes: Conjunctivae and EOM are normal. Pupils are equal, round, and reactive to light. Right eye exhibits no discharge. Left eye exhibits no discharge. No scleral icterus.  Neck: Normal range of motion. Neck supple. No thyromegaly present.  Cardiovascular: Normal rate, regular rhythm and normal heart sounds.   Pulmonary/Chest: Effort normal and breath sounds normal. No respiratory distress. She has no wheezes. She has no rales.  Lymphadenopathy:    She has no cervical adenopathy.   Skin: She is not diaphoretic.  Nursing note and vitals reviewed.   ED Course  Procedures (including critical care time)  Labs Review Labs Reviewed - No data to display  Imaging Review No results found.   Visual Acuity Review  Right Eye Distance:   Left Eye Distance:   Bilateral Distance:    Right Eye Near:   Left Eye Near:    Bilateral Near:         MDM   1. Acute maxillary sinusitis, recurrence not specified    Discharge Medication List as of 12/21/2014 12:17 PM    START taking these medications   Details  chlorpheniramine-HYDROcodone (TUSSIONEX PENNKINETIC ER) 10-8 MG/5ML SUER Take 5 mLs by mouth 2 (two) times  daily., Starting 12/21/2014, Until Discontinued, Print    amoxicillin-clavulanate (AUGMENTIN) 875-125 MG tablet Take 1 tablet by mouth every 12 (twelve) hours., Starting 12/21/2014, Until Discontinued, Print      1.  diagnosis reviewed with patient 2. rx as per orders above; reviewed possible side effects, interactions, risks and benefits  3. Recommend supportive treatment with otc analgesics, flonase 4. Follow-up prn if symptoms worsen or don't improve    Payton Mccallum, MD 02/03/15 1236

## 2015-02-16 ENCOUNTER — Ambulatory Visit (INDEPENDENT_AMBULATORY_CARE_PROVIDER_SITE_OTHER): Payer: BLUE CROSS/BLUE SHIELD | Admitting: Family Medicine

## 2015-02-16 ENCOUNTER — Encounter: Payer: Self-pay | Admitting: Family Medicine

## 2015-02-16 VITALS — BP 140/86 | HR 88 | Ht 60.0 in | Wt 138.2 lb

## 2015-02-16 DIAGNOSIS — E119 Type 2 diabetes mellitus without complications: Secondary | ICD-10-CM

## 2015-02-16 DIAGNOSIS — N951 Menopausal and female climacteric states: Secondary | ICD-10-CM | POA: Diagnosis not present

## 2015-02-16 DIAGNOSIS — E559 Vitamin D deficiency, unspecified: Secondary | ICD-10-CM | POA: Diagnosis not present

## 2015-02-16 DIAGNOSIS — R809 Proteinuria, unspecified: Secondary | ICD-10-CM

## 2015-02-16 DIAGNOSIS — E663 Overweight: Secondary | ICD-10-CM

## 2015-02-16 DIAGNOSIS — Z1239 Encounter for other screening for malignant neoplasm of breast: Secondary | ICD-10-CM | POA: Diagnosis not present

## 2015-02-16 DIAGNOSIS — K219 Gastro-esophageal reflux disease without esophagitis: Secondary | ICD-10-CM | POA: Diagnosis not present

## 2015-02-16 DIAGNOSIS — I129 Hypertensive chronic kidney disease with stage 1 through stage 4 chronic kidney disease, or unspecified chronic kidney disease: Secondary | ICD-10-CM

## 2015-02-16 DIAGNOSIS — E782 Mixed hyperlipidemia: Secondary | ICD-10-CM

## 2015-02-16 MED ORDER — METOPROLOL SUCCINATE ER 50 MG PO TB24: 50.0000 mg | ORAL_TABLET | Freq: Every day | ORAL | Status: DC

## 2015-02-16 MED ORDER — ASPIRIN 325 MG PO TBEC: 325.0000 mg | DELAYED_RELEASE_TABLET | Freq: Every day | ORAL | Status: DC

## 2015-02-16 MED ORDER — FENOFIBRATE MICRONIZED 134 MG PO CAPS: 134.0000 mg | ORAL_CAPSULE | Freq: Every day | ORAL | Status: DC

## 2015-02-16 NOTE — Patient Instructions (Signed)
Perimenopause Perimenopause is the time when your body begins to move into the menopause (no menstrual period for 12 straight months). It is a natural process. Perimenopause can begin 2-8 years before the menopause and usually lasts for 1 year after the menopause. During this time, your ovaries may or may not produce an egg. The ovaries vary in their production of estrogen and progesterone hormones each month. This can cause irregular menstrual periods, difficulty getting pregnant, vaginal bleeding between periods, and uncomfortable symptoms. CAUSES  Irregular production of the ovarian hormones, estrogen and progesterone, and not ovulating every month.  Other causes include:  Tumor of the pituitary gland in the brain.  Medical disease that affects the ovaries.  Radiation treatment.  Chemotherapy.  Unknown causes.  Heavy smoking and excessive alcohol intake can bring on perimenopause sooner. SIGNS AND SYMPTOMS   Hot flashes.  Night sweats.  Irregular menstrual periods.  Decreased sex drive.  Vaginal dryness.  Headaches.  Mood swings.  Depression.  Memory problems.  Irritability.  Tiredness.  Weight gain.  Trouble getting pregnant.  The beginning of losing bone cells (osteoporosis).  The beginning of hardening of the arteries (atherosclerosis). DIAGNOSIS  Your health care provider will make a diagnosis by analyzing your age, menstrual history, and symptoms. He or she will do a physical exam and note any changes in your body, especially your female organs. Female hormone tests may or may not be helpful depending on the amount of female hormones you produce and when you produce them. However, other hormone tests may be helpful to rule out other problems. TREATMENT  In some cases, no treatment is needed. The decision on whether treatment is necessary during the perimenopause should be made by you and your health care provider based on how the symptoms are affecting you  and your lifestyle. Various treatments are available, such as:  Treating individual symptoms with a specific medicine for that symptom.  Herbal medicines that can help specific symptoms. Try black cohosh over-the-counter.  Counseling.  Group therapy. HOME CARE INSTRUCTIONS   Keep track of your menstrual periods (when they occur, how heavy they are, how long between periods, and how long they last) as well as your symptoms and when they started.  Only take over-the-counter or prescription medicines as directed by your health care provider.  Sleep and rest.  Exercise.  Eat a diet that contains calcium (good for your bones) and soy (acts like the estrogen hormone).  Do not smoke.  Avoid alcoholic beverages.  Take vitamin supplements as recommended by your health care provider. Taking vitamin E may help in certain cases.  Take calcium and vitamin D supplements to help prevent bone loss.  Group therapy is sometimes helpful.  Acupuncture may help in some cases. SEEK MEDICAL CARE IF:   You have questions about any symptoms you are having.  You need a referral to a specialist (gynecologist, psychiatrist, or psychologist). SEEK IMMEDIATE MEDICAL CARE IF:   You have vaginal bleeding.  Your period lasts longer than 8 days.  Your periods are recurring sooner than 21 days.  You have bleeding after intercourse.  You have severe depression.  You have pain when you urinate.  You have severe headaches.  You have vision problems.   This information is not intended to replace advice given to you by your health care provider. Make sure you discuss any questions you have with your health care provider.   Document Released: 03/08/2004 Document Revised: 02/19/2014 Document Reviewed: 08/28/2012 Elsevier Interactive Patient  Education 2016 [employer].

## 2015-02-16 NOTE — Progress Notes (Signed)
Date:  02/16/2015   Name:  Tracy Bryan   DOB:  [birth year]   MRN:  [id number]  PCP:  Gabriel Cirri, NP    Chief Complaint: New Patient (Initial Visit) and Diabetes   History of Present Illness:  This is a 55 y.o. female for initial visit. Hx T2DM well controlled on metformin alone, BG's 100-130 qam. Hx HTN with apparent renal component with baseline mild CRI, possibly related to hx DIC. On metoprolol tartrate 50 mg daily. Takes cranberry extract for frequent UTI's which helps. On Cymbalta for mood and joint aches but denies fibromyalgia or depression. Hx mixed HLD on statin and fenofibrate and perimenopause on estrace for many years but does not think helping as still having hot flashes/night sweats. On Prevacid for GERD which recurs when stops suddenly. Tetanus imm this summer, flu imm in October, normal colonoscopy in 2014, needs mammogram.  Review of Systems:  Review of Systems  Constitutional: Negative for fever.  HENT: Negative for ear pain, sore throat and trouble swallowing.   Eyes: Negative for pain.  Respiratory: Negative for cough and shortness of breath.   Cardiovascular: Negative for chest pain and leg swelling.  Gastrointestinal: Negative for abdominal pain.  Endocrine: Negative for polyuria.  Genitourinary: Negative for difficulty urinating.  Neurological: Negative for syncope and light-headedness.  Psychiatric/Behavioral: Negative for dysphoric mood.    Patient Active Problem List   Diagnosis Date Noted  . Hypercholesteremia 07/28/2014  . 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 Routine general medical examination at a health care facility 07/28/2014  . Primary osteoarthritis of left hip 05/18/2014  . Diabetes mellitus type 2, controlled, without complications (HCC) 05/18/2014    Prior to Admission medications   Medication Sig Start Date End Date Taking? Authorizing Provider  aspirin 81 MG tablet Take 81 mg by mouth daily.   Yes Historical Provider, MD   CRANBERRY PO Take 4,200 mg by mouth daily.   Yes Historical Provider, MD  DULoxetine (CYMBALTA) 60 MG capsule TAKE 1 CAPSULE BY MOUTH DAILY 01/05/15  Yes Gabriel Cirri, NP  fenofibrate micronized (LOFIBRA) 134 MG capsule Take 1 capsule (134 mg total) by mouth daily before breakfast. 02/16/15  Yes Schuyler Amor, MD  FIBER PO Take 1 tablet by mouth every other day.   Yes Historical Provider, MD  glucose blood test strip 1 each by Other route daily. Use as instructed   Yes Historical Provider, MD  lansoprazole (PREVACID) 30 MG capsule Take 1 capsule (30 mg total) by mouth daily at 12 noon. 10/11/14  Yes Gabriel Cirri, NP  metFORMIN (GLUCOPHAGE) 500 MG tablet Take 1 tablet (500 mg total) by mouth 2 (two) times daily with a meal. 12/03/14  Yes Megan P Johnson, DO  Multiple Vitamins-Minerals (MULTIVITAMIN WITH MINERALS) tablet Take 1 tablet by mouth daily.   Yes Historical Provider, MD  POTASSIUM PO Take 1 tablet by mouth daily.   Yes Historical Provider, MD  simvastatin (ZOCOR) 40 MG tablet Take 1 tablet (40 mg total) by mouth at bedtime. 07/28/14  Yes Gabriel Cirri, NP  metoprolol succinate (TOPROL-XL) 50 MG 24 hr tablet Take 1 tablet (50 mg total) by mouth daily. Take with or immediately following a meal. 02/16/15   Schuyler Amor, MD    Allergies  Allergen Reactions  . Augmentin [Amoxicillin-Pot Clavulanate] Itching  . Benazepril Hcl Cough  . Penicillins Other (See Comments)    Past Surgical History  Procedure Laterality Date  . Hip arthroplasty Right   . Cervical disc surgery    .  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 Hernia repair  2000    Umblical  . Spine surgery    . Dilation and curettage of uterus  1996  . Abdominal hysterectomy      Social History  Substance Use Topics  . Smoking status: Former Smoker    Quit date: 06/14/1984  .  Smokeless tobacco: Never Used  . Alcohol Use: No    Family History  Problem Relation Age of Onset  . Hypertension Mother   . Stroke Mother   . Hyperlipidemia Mother   . Heart disease Mother     heart attack  . Diabetes Father   . Cancer Father     lung  . Diabetes Sister   . Hyperlipidemia Sister   . Stroke Paternal Grandfather   . Hypertension Paternal Grandfather   . Stroke Maternal Grandfather   . Alzheimer's disease Paternal Grandmother   . Diabetes Sister   . Cancer Sister     breast    Medication list has been reviewed and updated.  Physical Examination: BP 140/86 mmHg  Pulse 88  Ht 5' (1.524 m)  Wt 138 lb 3.2 oz (62.687 kg)  BMI 26.99 kg/m2  LMP  (LMP Unknown)  Physical Exam  Constitutional: She is oriented to person, place, and time. She appears well-developed and well-nourished.  HENT:  Head: Normocephalic and atraumatic.  Right Ear: External ear normal.  Left Ear: External ear normal.  Nose: Nose normal.  Mouth/Throat: Oropharynx is clear and moist.  Eyes: Conjunctivae and EOM are normal. Pupils are equal, round, and reactive to light.  Neck: Neck supple. No thyromegaly present.  Cardiovascular: Normal rate, regular rhythm and normal heart sounds.   Pulmonary/Chest: Effort normal and breath sounds normal.  Abdominal: Soft. She exhibits no distension and no mass. There is no tenderness.  Musculoskeletal: She exhibits no edema.  Lymphadenopathy:    She has no cervical adenopathy.  Neurological: She is alert and oriented to person, place, and time. Coordination normal.  Skin: Skin is warm and dry.  Psychiatric: She has a normal mood and affect. Her behavior is normal.  Nursing note and vitals reviewed.   Assessment and Plan:  1. Parenchymal renal hypertension, stage 1-4 or unspecified chronic kidney disease Marginal control, change metoprolol to succinate, consider ARB next visit (cough with benazapril) - Comprehensive Metabolic Panel (CMET) -  CBC  2. Controlled type 2 diabetes mellitus without complication, without long-term current use of insulin (HCC) Well controlled by report, check labs - HgB A1c - Urine Microalbumin w/creat. ratio  3. Mixed hyperlipidemia Unclear control on Zocor/fenofibrate, refill fenofibrate - Lipid Profile  4. Perimenopause Discussed LT risks ERT, will d/c Estrace, try black cohosh for hot flashes - Vitamin D (25 hydroxy) - screening mammogram  5. Gastroesophageal reflux disease, esophagitis presence not specified Discussed LT risks chronic PPI use, will attempt to taper off over next month  6. Overweight (BMI 25.0-29.9) Discussed exercise/weight loss - TSH  Return in about 4 weeks (around 03/16/2015).  Dionne AnoWilliam M. Kingsley SpittlePlonk, Jr. MD [facility]  02/16/2015

## 2015-02-17 ENCOUNTER — Telehealth: Payer: Self-pay

## 2015-02-17 DIAGNOSIS — R809 Proteinuria, unspecified: Secondary | ICD-10-CM | POA: Insufficient documentation

## 2015-02-17 DIAGNOSIS — N951 Menopausal and female climacteric states: Secondary | ICD-10-CM | POA: Insufficient documentation

## 2015-02-17 DIAGNOSIS — E663 Overweight: Secondary | ICD-10-CM | POA: Insufficient documentation

## 2015-02-17 DIAGNOSIS — E559 Vitamin D deficiency, unspecified: Secondary | ICD-10-CM | POA: Insufficient documentation

## 2015-02-17 DIAGNOSIS — K219 Gastro-esophageal reflux disease without esophagitis: Secondary | ICD-10-CM | POA: Insufficient documentation

## 2015-02-17 LAB — COMPREHENSIVE METABOLIC PANEL
A/G RATIO: 1.6 (ref 1.1–2.5)
ALBUMIN: 4.6 g/dL (ref 3.5–5.5)
ALT: 26 IU/L (ref 0–32)
AST: 28 IU/L (ref 0–40)
Alkaline Phosphatase: 53 IU/L (ref 39–117)
BUN / CREAT RATIO: 18 (ref 9–23)
BUN: 18 mg/dL (ref 6–24)
Bilirubin Total: <0.2 mg/dL (ref 0.0–1.2)
CALCIUM: 9.9 mg/dL (ref 8.7–10.2)
CO2: 23 mmol/L (ref 18–29)
CREATININE: 1 mg/dL (ref 0.57–1.00)
Chloride: 96 mmol/L (ref 96–106)
GFR, EST AFRICAN AMERICAN: 73 mL/min/{1.73_m2} (ref >59)
GFR, EST NON AFRICAN AMERICAN: 63 mL/min/{1.73_m2} (ref >59)
GLOBULIN, TOTAL: 2.9 g/dL (ref 1.5–4.5)
Glucose: 151 mg/dL — ABNORMAL HIGH (ref 65–99)
POTASSIUM: 4.3 mmol/L (ref 3.5–5.2)
SODIUM: 140 mmol/L (ref 134–144)
Total Protein: 7.5 g/dL (ref 6.0–8.5)

## 2015-02-17 LAB — MICROALBUMIN / CREATININE URINE RATIO
Creatinine, Urine: 114.7 mg/dL
MICROALB/CREAT RATIO: 104.5 mg/g{creat} — AB (ref 0.0–30.0)
Microalbumin, Urine: 119.9 ug/mL

## 2015-02-17 LAB — CBC
Hematocrit: 38.9 % (ref 34.0–46.6)
Hemoglobin: 12.9 g/dL (ref 11.1–15.9)
MCH: 26.6 pg (ref 26.6–33.0)
MCHC: 33.2 g/dL (ref 31.5–35.7)
MCV: 80 fL (ref 79–97)
Platelets: 321 10*3/uL (ref 150–379)
RBC: 4.85 x10E6/uL (ref 3.77–5.28)
RDW: 15 % (ref 12.3–15.4)
WBC: 7.8 10*3/uL (ref 3.4–10.8)

## 2015-02-17 LAB — HEMOGLOBIN A1C
ESTIMATED AVERAGE GLUCOSE: 183 mg/dL
HEMOGLOBIN A1C: 8 % — AB (ref 4.8–5.6)

## 2015-02-17 LAB — TSH: TSH: 1.37 u[IU]/mL (ref 0.450–4.500)

## 2015-02-17 LAB — VITAMIN D 25 HYDROXY (VIT D DEFICIENCY, FRACTURES): Vit D, 25-Hydroxy: 27 ng/mL — ABNORMAL LOW (ref 30.0–100.0)

## 2015-02-17 MED ORDER — METFORMIN HCL 1000 MG PO TABS: 1000.0000 mg | ORAL_TABLET | Freq: Two times a day (BID) | ORAL | Status: DC

## 2015-02-17 MED ORDER — VITAMIN D 50 MCG (2000 UT) PO CAPS: 1.0000 | ORAL_CAPSULE | Freq: Every day | ORAL | Status: AC

## 2015-02-17 MED ORDER — LOSARTAN POTASSIUM 50 MG PO TABS: 50.0000 mg | ORAL_TABLET | Freq: Every day | ORAL | Status: DC

## 2015-02-17 NOTE — Telephone Encounter (Signed)
LMTCB.. MA[facility] 

## 2015-02-17 NOTE — Telephone Encounter (Signed)
Lmtcb.

## 2015-02-17 NOTE — Addendum Note (Signed)
Addended by: Schuyler AmorPLONK, Zuriah Bordas on: 02/17/2015 09:30 AM   Modules accepted: Orders, Medications

## 2015-02-17 NOTE — Telephone Encounter (Signed)
Spoke with pt. Pt. Advised of her results and verbalized understanding. [facility]

## 2015-02-17 NOTE — Telephone Encounter (Signed)
-----   Message from Schuyler AmorWilliam Plonk, MD sent at 02/17/2015  9:27 AM EST ----- Inform blood work ok except diabetes control poor and vitamin D level low, recommend increase metformin to 1000 mg twice daily (rx sent) and begin OTC vit D3 2000 units daily.

## 2015-02-17 NOTE — Addendum Note (Signed)
Addended by: Schuyler AmorPLONK, Atha Muradyan on: 02/17/2015 01:58 PM   Modules accepted: Orders

## 2015-02-17 NOTE — Telephone Encounter (Signed)
-----   Message from Schuyler AmorWilliam Plonk, MD sent at 02/17/2015  1:54 PM EST ----- Inform protein level in urine high, recommend begin losartan to lower BP and protect kidney from diabetes (rx sent). Should not cause the cough she had with benazepril.

## 2015-02-18 NOTE — Telephone Encounter (Signed)
Spoke with patient. Patient verbalized understanding and will pick up new medications. [facility]

## 2015-03-10 ENCOUNTER — Ambulatory Visit (INDEPENDENT_AMBULATORY_CARE_PROVIDER_SITE_OTHER): Payer: BLUE CROSS/BLUE SHIELD | Admitting: Family Medicine

## 2015-03-10 ENCOUNTER — Encounter: Payer: Self-pay | Admitting: Family Medicine

## 2015-03-10 VITALS — BP 132/90 | HR 95 | Resp 16 | Ht 60.0 in | Wt 136.0 lb

## 2015-03-10 DIAGNOSIS — M797 Fibromyalgia: Secondary | ICD-10-CM | POA: Diagnosis not present

## 2015-03-10 DIAGNOSIS — R809 Proteinuria, unspecified: Secondary | ICD-10-CM

## 2015-03-10 DIAGNOSIS — E119 Type 2 diabetes mellitus without complications: Secondary | ICD-10-CM | POA: Diagnosis not present

## 2015-03-10 DIAGNOSIS — E785 Hyperlipidemia, unspecified: Secondary | ICD-10-CM

## 2015-03-10 DIAGNOSIS — E559 Vitamin D deficiency, unspecified: Secondary | ICD-10-CM | POA: Diagnosis not present

## 2015-03-10 DIAGNOSIS — I129 Hypertensive chronic kidney disease with stage 1 through stage 4 chronic kidney disease, or unspecified chronic kidney disease: Secondary | ICD-10-CM | POA: Diagnosis not present

## 2015-03-10 DIAGNOSIS — N951 Menopausal and female climacteric states: Secondary | ICD-10-CM

## 2015-03-10 DIAGNOSIS — K219 Gastro-esophageal reflux disease without esophagitis: Secondary | ICD-10-CM | POA: Diagnosis not present

## 2015-03-10 MED ORDER — LOSARTAN POTASSIUM 50 MG PO TABS: 50.0000 mg | ORAL_TABLET | Freq: Every day | ORAL | Status: DC

## 2015-03-10 MED ORDER — DULOXETINE HCL 60 MG PO CPEP: 60.0000 mg | ORAL_CAPSULE | Freq: Every day | ORAL | Status: DC

## 2015-03-10 MED ORDER — METFORMIN HCL 500 MG PO TABS
500.0000 mg | ORAL_TABLET | Freq: Two times a day (BID) | ORAL | Status: DC
Start: 2015-03-10 — End: 2015-07-29

## 2015-03-10 MED ORDER — GLIPIZIDE 5 MG PO TABS: 5.0000 mg | ORAL_TABLET | Freq: Two times a day (BID) | ORAL | Status: DC

## 2015-03-11 DIAGNOSIS — M797 Fibromyalgia: Secondary | ICD-10-CM | POA: Insufficient documentation

## 2015-03-11 NOTE — Progress Notes (Signed)
Date:  03/10/2015   Name:  Tracy Bryan   DOB:  Aug 26, 1960   MRN:  [id number]  PCP:  Gabriel Cirri, NP    Chief Complaint: Diabetes   History of Present Illness:  This is a 55 y.o. female seen in follow up from initial visit 3 weeks ago. Metformin increased due to a1c of 8.0% but pt reports persistent diarrhea which she has had at this dose in past. Unable to afford Januvia in past. Losartan started for elevated MCR which she is tolerating well. Taking vit D supplement. Off estrogen. Has stopped Prevacid but having recurrent GERD sxs. Needs refill Cymbalta.  Review of Systems:  Review of Systems  Respiratory: Negative for shortness of breath.   Cardiovascular: Negative for chest pain and leg swelling.  Endocrine: Negative for polyuria.  Genitourinary: Negative for difficulty urinating.  Neurological: Negative for syncope and light-headedness.    Patient Active Problem List   Diagnosis Date Noted  . Perimenopause 02/17/2015  . Esophageal reflux 02/17/2015  . Overweight (BMI 25.0-29.9) 02/17/2015  . Vitamin D deficiency 02/17/2015  . Microalbuminuria 02/17/2015  . Hyperlipidemia 07/28/2014  . 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 Diabetes mellitus type 2, controlled, without complications (HCC) 05/18/2014    Prior to Admission medications   Medication Sig Start Date End Date Taking? Authorizing Provider  aspirin 81 MG tablet Take 81 mg by mouth daily.   Yes Historical Provider, MD  Cholecalciferol (VITAMIN D) 2000 units CAPS Take 1 capsule (2,000 Units total) by mouth daily. 02/17/15  Yes Schuyler Amor, MD  CRANBERRY PO Take 4,200 mg by mouth daily.   Yes Historical Provider, MD  DULoxetine (CYMBALTA) 60 MG capsule Take 1 capsule (60 mg total) by mouth daily. 03/10/15  Yes Schuyler Amor, MD  fenofibrate micronized (LOFIBRA) 134 MG capsule Take 1 capsule (134 mg total) by mouth daily before breakfast. 02/16/15  Yes Schuyler Amor, MD  FIBER PO Take 1 tablet by mouth every  other day.   Yes Historical Provider, MD  glucose blood test strip 1 each by Other route daily. Use as instructed   Yes Historical Provider, MD  losartan (COZAAR) 50 MG tablet Take 1 tablet (50 mg total) by mouth daily. 03/10/15  Yes Schuyler Amor, MD  metFORMIN (GLUCOPHAGE) 500 MG tablet Take 1 tablet (500 mg total) by mouth 2 (two) times daily with a meal. 03/10/15  Yes Schuyler Amor, MD  metoprolol succinate (TOPROL-XL) 50 MG 24 hr tablet Take 1 tablet (50 mg total) by mouth daily. Take with or immediately following a meal. 02/16/15  Yes Schuyler Amor, MD  Multiple Vitamins-Minerals (MULTIVITAMIN WITH MINERALS) tablet Take 1 tablet by mouth daily.   Yes Historical Provider, MD  POTASSIUM PO Take 1 tablet by mouth daily.   Yes Historical Provider, MD  simvastatin (ZOCOR) 40 MG tablet Take 1 tablet (40 mg total) by mouth at bedtime. 07/28/14  Yes Gabriel Cirri, NP  glipiZIDE (GLUCOTROL) 5 MG tablet Take 1 tablet (5 mg total) by mouth 2 (two) times daily before a meal. 03/10/15   Schuyler Amor, MD    Allergies  Allergen Reactions  . Augmentin [Amoxicillin-Pot Clavulanate] Itching  . Benazepril Hcl Cough  . Penicillins Other (See Comments)    Past Surgical History  Procedure Laterality Date  . Hip arthroplasty Right   . Cervical disc surgery    . 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 Hernia repair  2000    Umblical  . Spine surgery    . Dilation and curettage of uterus  1996  . Abdominal hysterectomy      Social History  Substance Use Topics  . Smoking status: Former Smoker    Quit date: 06/14/1984  . Smokeless tobacco: Never Used  . Alcohol Use: No    Family History  Problem Relation Age of Onset  . Hypertension Mother   . Stroke Mother   . Hyperlipidemia Mother   . Heart disease Mother     heart attack  . Diabetes Father   .  Cancer Father     lung  . Diabetes Sister   . Hyperlipidemia Sister   . Stroke Paternal Grandfather   . Hypertension Paternal Grandfather   . Stroke Maternal Grandfather   . Alzheimer's disease Paternal Grandmother   . Diabetes Sister   . Cancer Sister     breast    Medication list has been reviewed and updated.  Physical Examination: BP 132/90 mmHg  Pulse 95  Resp 16  Ht 5' (1.524 m)  Wt 136 lb (61.689 kg)  BMI 26.56 kg/m2  SpO2 97%  LMP  (LMP Unknown)  Physical Exam  Constitutional: She appears well-developed and well-nourished.  Cardiovascular: Normal rate, regular rhythm and normal heart sounds.   Pulmonary/Chest: Effort normal and breath sounds normal.  Musculoskeletal: She exhibits no edema.  Neurological: She is alert.  Skin: Skin is warm and dry.  Psychiatric: She has a normal mood and affect. Her behavior is normal.  Nursing note and vitals reviewed.   Assessment and Plan:  1. Controlled type 2 diabetes mellitus without complication, without long-term current use of insulin (HCC) Intolerant high dose metformin, decrease back to 500 mg bid and add glipizide 5 mg bid  2. Hyperlipidemia On Zocor/fenofibrate, consider lipid profile next visit  3. Parenchymal renal hypertension, stage 1-4 or unspecified chronic kidney disease Improved control on losartan (confirm off potassium supplement next visit)  4. Perimenopause Off estrogen, tolerating well  5. Vitamin D deficiency On supplementation, consider level next visit  6. Microalbuminuria On losartan, consider repeat MCR next visit  7. Gastroesophageal reflux disease, esophagitis presence not specified Off PPI, try Tums or OTC Zantac prn for recurrent sxs  8. Fibromyalgia Well controlled, refill Cymbalta  Return in about 4 weeks (around 04/07/2015).  Dionne Ano. Kingsley Spittle MD [facility]  03/11/2015

## 2015-03-17 ENCOUNTER — Ambulatory Visit: Payer: BLUE CROSS/BLUE SHIELD | Admitting: Family Medicine

## 2015-04-14 ENCOUNTER — Ambulatory Visit: Payer: BLUE CROSS/BLUE SHIELD | Admitting: Family Medicine

## 2015-04-22 ENCOUNTER — Ambulatory Visit (INDEPENDENT_AMBULATORY_CARE_PROVIDER_SITE_OTHER): Payer: BLUE CROSS/BLUE SHIELD | Admitting: Family Medicine

## 2015-04-22 ENCOUNTER — Encounter: Payer: Self-pay | Admitting: Family Medicine

## 2015-04-22 VITALS — BP 120/82 | HR 91 | Temp 98.3°F | Resp 16 | Ht 60.0 in | Wt 137.8 lb

## 2015-04-22 DIAGNOSIS — M797 Fibromyalgia: Secondary | ICD-10-CM | POA: Diagnosis not present

## 2015-04-22 DIAGNOSIS — E559 Vitamin D deficiency, unspecified: Secondary | ICD-10-CM | POA: Diagnosis not present

## 2015-04-22 DIAGNOSIS — E119 Type 2 diabetes mellitus without complications: Secondary | ICD-10-CM | POA: Diagnosis not present

## 2015-04-22 DIAGNOSIS — I129 Hypertensive chronic kidney disease with stage 1 through stage 4 chronic kidney disease, or unspecified chronic kidney disease: Secondary | ICD-10-CM

## 2015-04-22 DIAGNOSIS — R809 Proteinuria, unspecified: Secondary | ICD-10-CM | POA: Diagnosis not present

## 2015-04-22 DIAGNOSIS — E785 Hyperlipidemia, unspecified: Secondary | ICD-10-CM | POA: Diagnosis not present

## 2015-04-22 NOTE — Progress Notes (Signed)
Date:  04/22/2015   Name:  Tracy Bryan   DOB:  [birth year]   MRN:  [id number]  PCP:  Gabriel Cirri, NP    Chief Complaint: Diabetes   History of Present Illness:  This is a 55 y.o. female for 2 month f/u. DM well controlled on metformin/glipizide with home BG's 105-130. Taking vit D supp. Weight stable. Fibromyalgia sxs stable on Cymbalta. Off estrogen, using black cohosh with success.  Review of Systems:  Review of Systems  Constitutional: Negative for fever and fatigue.  Respiratory: Negative for shortness of breath.   Cardiovascular: Negative for chest pain and leg swelling.  Endocrine: Negative for polyuria.  Genitourinary: Negative for difficulty urinating.  Neurological: Negative for syncope and light-headedness.    Patient Active Problem List   Diagnosis Date Noted  . Fibromyalgia 03/11/2015  . Perimenopause 02/17/2015  . Esophageal reflux 02/17/2015  . Overweight (BMI 25.0-29.9) 02/17/2015  . Vitamin D deficiency 02/17/2015  . Microalbuminuria 02/17/2015  . Hyperlipidemia 07/28/2014  . 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 Diabetes mellitus type 2, controlled, without complications (HCC) 05/18/2014    Prior to Admission medications   Medication Sig Start Date End Date Taking? Authorizing Provider  aspirin 81 MG tablet Take 81 mg by mouth daily.   Yes Historical Provider, MD  Cholecalciferol (VITAMIN D) 2000 units CAPS Take 1 capsule (2,000 Units total) by mouth daily. 02/17/15  Yes Schuyler Amor, MD  CRANBERRY PO Take 4,200 mg by mouth daily.   Yes Historical Provider, MD  DULoxetine (CYMBALTA) 60 MG capsule Take 1 capsule (60 mg total) by mouth daily. 03/10/15  Yes Schuyler Amor, MD  fenofibrate micronized (LOFIBRA) 134 MG capsule Take 1 capsule (134 mg total) by mouth daily before breakfast. 02/16/15  Yes Schuyler Amor, MD  FIBER PO Take 1 tablet by mouth every other day.   Yes Historical Provider, MD  glipiZIDE (GLUCOTROL) 5 MG tablet Take 1 tablet (5 mg  total) by mouth 2 (two) times daily before a meal. 03/10/15  Yes Schuyler Amor, MD  glucose blood test strip 1 each by Other route daily. Use as instructed   Yes Historical Provider, MD  losartan (COZAAR) 50 MG tablet Take 1 tablet (50 mg total) by mouth daily. 03/10/15  Yes Schuyler Amor, MD  metFORMIN (GLUCOPHAGE) 500 MG tablet Take 1 tablet (500 mg total) by mouth 2 (two) times daily with a meal. 03/10/15  Yes Schuyler Amor, MD  metoprolol succinate (TOPROL-XL) 50 MG 24 hr tablet Take 1 tablet (50 mg total) by mouth daily. Take with or immediately following a meal. 02/16/15  Yes Schuyler Amor, MD  Multiple Vitamins-Minerals (MULTIVITAMIN WITH MINERALS) tablet Take 1 tablet by mouth daily.   Yes Historical Provider, MD  simvastatin (ZOCOR) 40 MG tablet Take 1 tablet (40 mg total) by mouth at bedtime. 07/28/14  Yes Gabriel Cirri, NP    Allergies  Allergen Reactions  . Augmentin [Amoxicillin-Pot Clavulanate] Itching  . Benazepril Hcl Cough  . Penicillins Other (See Comments)    Past Surgical History  Procedure Laterality Date  . Hip arthroplasty Right   . Cervical disc surgery    . 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 Hernia repair  2000    Umblical  . Spine surgery    . Dilation and curettage of uterus  1996  . Abdominal hysterectomy  Social History  Substance Use Topics  . Smoking status: Former Smoker    Quit date: 06/14/1984  . Smokeless tobacco: Never Used  . Alcohol Use: No    Family History  Problem Relation Age of Onset  . Hypertension Mother   . Stroke Mother   . Hyperlipidemia Mother   . Heart disease Mother     heart attack  . Diabetes Father   . Cancer Father     lung  . Diabetes Sister   . Hyperlipidemia Sister   . Stroke Paternal Grandfather   . Hypertension Paternal Grandfather   . Stroke Maternal  Grandfather   . Alzheimer's disease Paternal Grandmother   . Diabetes Sister   . Cancer Sister     breast    Medication list has been reviewed and updated.  Physical Examination: BP 120/82 mmHg  Pulse 91  Temp(Src) 98.3 F (36.8 C)  Resp 16  Ht 5' (1.524 m)  Wt 137 lb 12.8 oz (62.506 kg)  BMI 26.91 kg/m2  SpO2 98%  LMP  (LMP Unknown)  Physical Exam  Constitutional: She appears well-developed and well-nourished.  Cardiovascular: Normal rate, regular rhythm and normal heart sounds.   Pulmonary/Chest: Effort normal and breath sounds normal.  Musculoskeletal: She exhibits no edema.  Neurological: She is alert.  Skin: Skin is warm and dry.  Psychiatric: She has a normal mood and affect. Her behavior is normal.  Nursing note and vitals reviewed.   Assessment and Plan:  1. Controlled type 2 diabetes mellitus without complication, without long-term current use of insulin (HCC) Apparently well controlled on metformin/glipizide - HgB A1c  2. Parenchymal renal hypertension, stage 1-4 or unspecified chronic kidney disease Well controlled on metoprolol/losartan - Basic Metabolic Panel (BMET)  3. Fibromyalgia Well controlled on Cymbalta  4. Hyperlipidemia Unclear control on Zocor/fenofibrate - Lipid Profile  5. Vitamin D deficiency On supplement - Vitamin D (25 hydroxy)  6. Microalbuminuria On losartan - Urine Microalbumin w/creat. ratio  Return in about 3 months (around 07/23/2015).  Dionne AnoWilliam M. Kingsley SpittlePlonk, Jr. MD [facility]  04/22/2015

## 2015-04-23 LAB — BASIC METABOLIC PANEL
BUN / CREAT RATIO: 18 (ref 9–23)
BUN: 19 mg/dL (ref 6–24)
CHLORIDE: 101 mmol/L (ref 96–106)
CO2: 22 mmol/L (ref 18–29)
Calcium: 9.6 mg/dL (ref 8.7–10.2)
Creatinine, Ser: 1.07 mg/dL — ABNORMAL HIGH (ref 0.57–1.00)
GFR calc Af Amer: 67 mL/min/{1.73_m2} (ref >59)
GFR calc non Af Amer: 58 mL/min/{1.73_m2} — ABNORMAL LOW (ref >59)
GLUCOSE: 114 mg/dL — AB (ref 65–99)
Potassium: 4.4 mmol/L (ref 3.5–5.2)
SODIUM: 142 mmol/L (ref 134–144)

## 2015-04-23 LAB — HEMOGLOBIN A1C
Est. average glucose Bld gHb Est-mCnc: 171 mg/dL
HEMOGLOBIN A1C: 7.6 % — AB (ref 4.8–5.6)

## 2015-04-23 LAB — LIPID PANEL
CHOL/HDL RATIO: 4.4 ratio (ref 0.0–4.4)
Cholesterol, Total: 162 mg/dL (ref 100–199)
HDL: 37 mg/dL — ABNORMAL LOW (ref >39)
Triglycerides: 447 mg/dL — ABNORMAL HIGH (ref 0–149)

## 2015-04-23 LAB — MICROALBUMIN / CREATININE URINE RATIO
Creatinine, Urine: 99.5 mg/dL
MICROALB/CREAT RATIO: 115.3 mg/g{creat} — AB (ref 0.0–30.0)
MICROALBUM., U, RANDOM: 114.7 ug/mL

## 2015-04-23 LAB — VITAMIN D 25 HYDROXY (VIT D DEFICIENCY, FRACTURES): Vit D, 25-Hydroxy: 41.6 ng/mL (ref 30.0–100.0)

## 2015-04-25 MED ORDER — LOSARTAN POTASSIUM 100 MG PO TABS: 100.0000 mg | ORAL_TABLET | Freq: Every day | ORAL | Status: DC

## 2015-04-25 NOTE — Addendum Note (Signed)
Addended by: Schuyler AmorPLONK, Corona Popovich on: 04/25/2015 05:55 PM   Modules accepted: Orders, Medications

## 2015-04-26 ENCOUNTER — Telehealth: Payer: Self-pay

## 2015-04-26 NOTE — Telephone Encounter (Signed)
-----   Message from Schuyler AmorWilliam Plonk, MD sent at 04/25/2015  5:52 PM EDT ----- Inform diabetes control improved but urine protein level high, recommend increase losartan to 100 mg daily (may take two 50 mg tablets, rx for new dose sent).

## 2015-04-26 NOTE — Telephone Encounter (Signed)
Spoke with pt. Pt. Advised of all results and verbalized understanding.  ?

## 2015-04-26 NOTE — Telephone Encounter (Signed)
Left message for patient to call back  

## 2015-05-02 ENCOUNTER — Encounter: Payer: Self-pay | Admitting: Emergency Medicine

## 2015-05-02 ENCOUNTER — Ambulatory Visit (INDEPENDENT_AMBULATORY_CARE_PROVIDER_SITE_OTHER): Payer: BLUE CROSS/BLUE SHIELD

## 2015-05-02 ENCOUNTER — Ambulatory Visit
Admission: EM | Admit: 2015-05-02 | Discharge: 2015-05-02 | Disposition: A | Discharge status: Home / Self Care | Payer: BLUE CROSS/BLUE SHIELD | Attending: Emergency Medicine | Admitting: Emergency Medicine

## 2015-05-02 DIAGNOSIS — B349 Viral infection, unspecified: Secondary | ICD-10-CM | POA: Diagnosis not present

## 2015-05-02 DIAGNOSIS — R05 Cough: Secondary | ICD-10-CM

## 2015-05-02 DIAGNOSIS — R062 Wheezing: Secondary | ICD-10-CM

## 2015-05-02 DIAGNOSIS — J0140 Acute pansinusitis, unspecified: Secondary | ICD-10-CM

## 2015-05-02 DIAGNOSIS — R059 Cough, unspecified: Secondary | ICD-10-CM

## 2015-05-02 LAB — RAPID INFLUENZA A&B ANTIGENS (ARMC ONLY): INFLUENZA A (ARMC): NEGATIVE

## 2015-05-02 LAB — RAPID INFLUENZA A&B ANTIGENS: Influenza B (ARMC): NEGATIVE

## 2015-05-02 MED ORDER — MOMETASONE FUROATE 50 MCG/ACT NA SUSP: 2.0000 | Freq: Every day | NASAL | Status: DC

## 2015-05-02 MED ORDER — IPRATROPIUM-ALBUTEROL 0.5-2.5 (3) MG/3ML IN SOLN
3.0000 mL | Freq: Once | RESPIRATORY_TRACT | Status: AC
  Administered 2015-05-02: 3 mL via RESPIRATORY_TRACT

## 2015-05-02 MED ORDER — HYDROCOD POLST-CPM POLST ER 10-8 MG/5ML PO SUER: 5.0000 mL | Freq: Two times a day (BID) | ORAL | Status: DC | PRN

## 2015-05-02 MED ORDER — IBUPROFEN 800 MG PO TABS: 800.0000 mg | ORAL_TABLET | Freq: Three times a day (TID) | ORAL | Status: DC

## 2015-05-02 MED ORDER — AEROCHAMBER PLUS MISC: Status: DC

## 2015-05-02 MED ORDER — ALBUTEROL SULFATE HFA 108 (90 BASE) MCG/ACT IN AERS
1.0000 | INHALATION_SPRAY | Freq: Four times a day (QID) | RESPIRATORY_TRACT | Status: DC | PRN
Start: 2015-05-02 — End: 2015-05-04

## 2015-05-02 NOTE — Discharge Instructions (Signed)
Take the medication as written. Return if you get worse, have a fever >100.4, or for any concerns. You may take 800 mg of motrin with 1 gram of tylenol up to 3 times a day as needed for pain. This is an effective combination for pain.  Most sinus infections are viral and do not need antibiotics unless you have a high fever, have had this for 10 days, or you get better and then get sick again. Use a neti pot or the NeilMed sinus rinse as often as you want to to reduce nasal congestion.  Follow the directions on the box. Start the nasal steroid, regular Mucinex. 2 puffs from your albuterol inhaler every 4-6 hours as needed for coughing, wheezing, shortness of breath. Go to the ER for the signs and symptoms we discussed otherwise follow up with your doctor in several days.   Go to www.goodrx.com to look up your medications. This will give you a list of where you can find your prescriptions at the most affordable prices.

## 2015-05-02 NOTE — ED Provider Notes (Signed)
HPI  SUBJECTIVE:  Tracy Bryan is a 55 y.o. female who presents with body aches, frontal headache which is worse with bending forward and lying down, nasal congestion, sinus pain/pressure, postnasal drip for 2 days. She reports bilateral ear pain, fatigue, malaise. There are no other aggravating or alleviating factors. She has tried Mucinex DM, DayQuil without improvement. No nausea, vomiting, fevers, chest pain, shortness of breath, wheezing. No abdominal pain, urinary complaints. No new back pain. She reports having 4 episodes of diarrhea at the beginning of the illness, but this has since resolved. No rash, stiff neck, photophobia. No known contacts with flu. She did get the flu shot this year. She has not taken antipyretic in the past 4-6 hours. She states that she is unable to sleep at night secondary to coughing. She has a  past medical history of diabetes, states that her glucose has been running within normal limits for her, hypertension, hypercholesterolemia, GERD. She is a former smoker. No known history of asthma, emphysema, COPD. LMP: Hysterectomy. PMD: Dr. Hollace Hayward  Past Medical History  Diagnosis Date  . PONV (postoperative nausea and vomiting)   . Hypertension   . Pneumonia   . Diabetes mellitus without complication (HCC)     Type 2  . GERD (gastroesophageal reflux disease)   . Arthritis   . Hypercholesteremia   . Disseminated intravascular coagulation (HCC) 1999    After child birth  . Herpes simplex   . Migraines     Past Surgical History  Procedure Laterality Date  . Hip arthroplasty Right   . Cervical disc surgery    . 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 Hernia repair  2000    Umblical  . Spine surgery    . Dilation and curettage of uterus  1996  . Abdominal hysterectomy      Family History  Problem  Relation Age of Onset  . Hypertension Mother   . Stroke Mother   . Hyperlipidemia Mother   . Heart disease Mother     heart attack  . Diabetes Father   . Cancer Father     lung  . Diabetes Sister   . Hyperlipidemia Sister   . Stroke Paternal Grandfather   . Hypertension Paternal Grandfather   . Stroke Maternal Grandfather   . Alzheimer's disease Paternal Grandmother   . Diabetes Sister   . Cancer Sister     breast    Social History  Substance Use Topics  . Smoking status: Former Smoker    Quit date: 06/14/1984  . Smokeless tobacco: Never Used  . Alcohol Use: No    No current facility-administered medications for this encounter.  Current outpatient prescriptions:  .  albuterol (PROVENTIL HFA;VENTOLIN HFA) 108 (90 Base) MCG/ACT inhaler, Inhale 1-2 puffs into the lungs every 6 (six) hours as needed for wheezing or shortness of breath., Disp: 1 Inhaler, Rfl: 0 .  aspirin 81 MG tablet, Take 81 mg by mouth daily., Disp: , Rfl:  .  chlorpheniramine-HYDROcodone (TUSSIONEX PENNKINETIC ER) 10-8 MG/5ML SUER, Take 5 mLs by mouth every 12 (twelve) hours as needed for cough., Disp: 120 mL, Rfl: 0 .  Cholecalciferol (VITAMIN D) 2000 units CAPS, Take 1 capsule (2,000 Units total) by mouth daily., Disp: 30 capsule, Rfl:  .  CRANBERRY PO, Take 4,200 mg by mouth daily., Disp: , Rfl:  .  DULoxetine (CYMBALTA) 60 MG capsule, Take 1 capsule (60 mg total) by mouth daily., Disp: 90 capsule, Rfl: 3 .  fenofibrate micronized (LOFIBRA) 134 MG capsule, Take 1 capsule (134 mg total) by mouth daily before breakfast., Disp: 90 capsule, Rfl: 1 .  FIBER PO, Take 1 tablet by mouth every other day., Disp: , Rfl:  .  glipiZIDE (GLUCOTROL) 5 MG tablet, Take 1 tablet (5 mg total) by mouth 2 (two) times daily before a meal., Disp: 60 tablet, Rfl: 3 .  glucose blood test strip, 1 each by Other route daily. Use as instructed, Disp: , Rfl:  .  ibuprofen (ADVIL,MOTRIN) 800 MG tablet, Take 1 tablet (800 mg total) by  mouth 3 (three) times daily., Disp: 30 tablet, Rfl: 0 .  losartan (COZAAR) 100 MG tablet, Take 1 tablet (100 mg total) by mouth daily., Disp: 30 tablet, Rfl: 2 .  metFORMIN (GLUCOPHAGE) 500 MG tablet, Take 1 tablet (500 mg total) by mouth 2 (two) times daily with a meal., Disp: 180 tablet, Rfl: 3 .  metoprolol succinate (TOPROL-XL) 50 MG 24 hr tablet, Take 1 tablet (50 mg total) by mouth daily. Take with or immediately following a meal., Disp: 90 tablet, Rfl: 3 .  mometasone (NASONEX) 50 MCG/ACT nasal spray, Place 2 sprays into the nose daily., Disp: 17 g, Rfl: 0 .  Multiple Vitamins-Minerals (MULTIVITAMIN WITH MINERALS) tablet, Take 1 tablet by mouth daily., Disp: , Rfl:  .  simvastatin (ZOCOR) 40 MG tablet, Take 1 tablet (40 mg total) by mouth at bedtime., Disp: 90 tablet, Rfl: 1 .  Spacer/Aero-Holding Chambers (AEROCHAMBER PLUS) inhaler, Use as instructed, Disp: 1 each, Rfl: 2  Allergies  Allergen Reactions  . Augmentin [Amoxicillin-Pot Clavulanate] Itching  . Benazepril Hcl Cough  . Penicillins Other (See Comments)     ROS  As noted in HPI.   Physical Exam  BP 153/85 mmHg  Pulse 115  Temp(Src) 99.8 F (37.7 C) (Tympanic)  Resp 18  Ht 5' (1.524 m)  Wt 132 lb (59.875 kg)  BMI 25.78 kg/m2  SpO2 97%  LMP  (LMP Unknown)  Constitutional: Well developed, well nourished, no acute distress Eyes:  EOMI, conjunctiva normal bilaterally HENT: Normocephalic, atraumatic,mucus membranes moist. TMs normal bilaterally, positive nasal congestion positive maxillary and frontal sinus tenderness. Normal oropharynx. Positive postnasal drip. Uvula midline. Neck: No cervical lymphadenopathy. No meningismus. Respiratory: Normal inspiratory effort. Fair air movement, wheezing left upper lobe, prolonged expiratory phase Cardiovascular: Regular tachycardia, no murmurs, rubs, gallops GI: nondistended soft, nontender. Normal bowel sounds. Back: No CVA tenderness skin: No rash, skin  intact Musculoskeletal: no deformities Neurologic: Alert & oriented x 3, no focal neuro deficits Psychiatric: Speech and behavior appropriate   ED Course   Medications  ipratropium-albuterol (DUONEB) 0.5-2.5 (3) MG/3ML nebulizer solution 3 mL (3 mLs Nebulization Given 05/02/15 1630)    Orders Placed This Encounter  Procedures  . Rapid Influenza A&B Antigens ([facility] only)    Standing Status: Standing     Number of Occurrences: 1     Standing Expiration Date:   . DG Chest 2 View    Standing Status: Standing     Number of Occurrences: 1     Standing Expiration Date:     Order Specific Question:  Reason for Exam (SYMPTOM  OR DIAGNOSIS REQUIRED)    Answer:  r/o PNA wheezing Left upper lobe  . Droplet precaution    Standing Status: Standing     Number of Occurrences: 1     Standing  Expiration Date:     Results for orders placed or performed during the hospital encounter of 05/02/15 (from the past 24 hour(s))  Rapid Influenza A&B Antigens ([facility] only)     Status: None   Collection Time: 05/02/15  4:07 PM  Result Value Ref Range   Influenza A ([facility]) NEGATIVE NEGATIVE   Influenza B ([facility]) NEGATIVE NEGATIVE   Dg Chest 2 View  05/02/2015  CLINICAL DATA:  Dry cough for 3 days. Headache and congestion. Left upper lobe wheezing. Evaluation for pneumonia. EXAM: CHEST  2 VIEW COMPARISON:  05/06/2014 FINDINGS: The cardiomediastinal silhouette is within normal limits. The lungs are well inflated. No confluent airspace opacity, edema, pleural effusion, or pneumothorax is identified. There is slight chronic prominence of the interstitial markings. Prior lower cervical ACDF is noted. IMPRESSION: No active cardiopulmonary disease. Electronically Signed   By: Sebastian Ache M.D.   On: 05/02/2015 16:53    ED Clinical Impression  Viral syndrome  Acute pansinusitis, recurrence not specified  Cough  Wheezing   ED Assessment/Plan  Checking chest x-ray due to the isolated wheezing to rule out  pneumonia. She is afebrile here. Influenza is negative. Negative flu. Chest x-ray reviewed. No pneumonia. See radiology report for details  Presentation most consistent with a viral syndrome, rather than flu. Her headache seems to be more sinusitis. She has no clear indications for antibiotics at this time in the absence of fevers or other severe symptoms. Saline nasal irrigation, nasal steroids, regular Mucinex, ibuprofen 800 mg for the sinusitis.   On reevaluation, she states that she feels better after the DuoNeb. She has some persistent wheezing in the left side, but improved air movement. We'll send her home with albuterol with spacer, Tussionex to help her sleep at night. No steroids given the absence of chest pain, shortness of breath, history of airway disease although she may have a component of this given that she is an ex-smoker. Patient states that she has a frequent cough normally. The wheezing may also be from acid reflux. Discussed this with the patient.   Discussed labs, imaging, MDM, plan and followup with patient. Discussed sn/sx that should prompt return to the  ED. Patient  agrees with plan.  *This clinic note was created using Dragon dictation software. Therefore, there may be occasional mistakes despite careful proofreading.  ?   Domenick Gong, MD 05/02/15 [phone]

## 2015-05-02 NOTE — ED Notes (Signed)
Patient states she developed a cough, head ache and body aches on Saturday

## 2015-05-04 ENCOUNTER — Encounter: Payer: Self-pay | Admitting: Family Medicine

## 2015-05-04 ENCOUNTER — Ambulatory Visit (INDEPENDENT_AMBULATORY_CARE_PROVIDER_SITE_OTHER): Payer: Medicare Other | Admitting: Family Medicine

## 2015-05-04 VITALS — BP 118/84 | HR 92 | Temp 99.1°F | Resp 14 | Ht 60.0 in | Wt 137.6 lb

## 2015-05-04 DIAGNOSIS — R809 Proteinuria, unspecified: Secondary | ICD-10-CM

## 2015-05-04 DIAGNOSIS — M797 Fibromyalgia: Secondary | ICD-10-CM | POA: Diagnosis not present

## 2015-05-04 DIAGNOSIS — E785 Hyperlipidemia, unspecified: Secondary | ICD-10-CM | POA: Diagnosis not present

## 2015-05-04 DIAGNOSIS — E119 Type 2 diabetes mellitus without complications: Secondary | ICD-10-CM | POA: Diagnosis not present

## 2015-05-04 DIAGNOSIS — J209 Acute bronchitis, unspecified: Secondary | ICD-10-CM

## 2015-05-04 DIAGNOSIS — E559 Vitamin D deficiency, unspecified: Secondary | ICD-10-CM

## 2015-05-04 DIAGNOSIS — J40 Bronchitis, not specified as acute or chronic: Secondary | ICD-10-CM | POA: Diagnosis not present

## 2015-05-04 DIAGNOSIS — I129 Hypertensive chronic kidney disease with stage 1 through stage 4 chronic kidney disease, or unspecified chronic kidney disease: Secondary | ICD-10-CM

## 2015-05-04 MED ORDER — DOXYCYCLINE HYCLATE 100 MG PO CAPS: 100.0000 mg | ORAL_CAPSULE | Freq: Two times a day (BID) | ORAL | Status: DC

## 2015-05-04 MED ORDER — PREDNISONE 10 MG PO TABS: 10.0000 mg | ORAL_TABLET | Freq: Two times a day (BID) | ORAL | Status: DC

## 2015-05-04 MED ORDER — ALBUTEROL SULFATE HFA 108 (90 BASE) MCG/ACT IN AERS: 2.0000 | INHALATION_SPRAY | Freq: Four times a day (QID) | RESPIRATORY_TRACT | Status: DC | PRN

## 2015-05-04 NOTE — Progress Notes (Signed)
Date:  05/04/2015   Name:  Tracy Bryan   DOB:  10/18/1960   MRN:  [id number]  PCP:  Gabriel Cirriheryl Wicker, NP    Chief Complaint: Fever and Cough   History of Present Illness:  This is a 55 y.o. female with 4d hx NP cough, seen [facility] where rapid flu and CXR negative, d/c'd with Tussionex and Ventolin MDI but insurance wouldn't cover MDI. Cough worse since, ass with sore throat, rhinorrhea, headache. Losartan increased after last visit for elevated MCR. DM and fibromyalgia remain well controlled.  Review of Systems:  Review of Systems  Constitutional: Negative for fever.  HENT: Negative for trouble swallowing.   Eyes: Negative for pain.  Cardiovascular: Negative for leg swelling.  Endocrine: Negative for polyuria.  Genitourinary: Negative for difficulty urinating.  Neurological: Negative for syncope and light-headedness.    Patient Active Problem List   Diagnosis Date Noted  . Fibromyalgia 03/11/2015  . Perimenopause 02/17/2015  . Esophageal reflux 02/17/2015  . Overweight (BMI 25.0-29.9) 02/17/2015  . Vitamin D deficiency 02/17/2015  . Microalbuminuria 02/17/2015  . Hyperlipidemia 07/28/2014  . 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 Diabetes mellitus type 2, controlled, without complications (HCC) 05/18/2014    Prior to Admission medications   Medication Sig Start Date End Date Taking? Authorizing Provider  aspirin 81 MG tablet Take 81 mg by mouth daily.   Yes Historical Provider, MD  Cholecalciferol (VITAMIN D) 2000 units CAPS Take 1 capsule (2,000 Units total) by mouth daily. 02/17/15  Yes Schuyler AmorWilliam Kenlee Vogt, MD  CRANBERRY PO Take 4,200 mg by mouth daily.   Yes Historical Provider, MD  DULoxetine (CYMBALTA) 60 MG capsule Take 1 capsule (60 mg total) by mouth daily. 03/10/15  Yes Schuyler AmorWilliam Adisa Litt, MD  fenofibrate micronized (LOFIBRA) 134 MG capsule Take 1 capsule (134 mg total) by mouth daily before breakfast. 02/16/15  Yes Schuyler AmorWilliam Bladen Umar, MD  FIBER PO Take 1 tablet by mouth every  other day.   Yes Historical Provider, MD  glipiZIDE (GLUCOTROL) 5 MG tablet Take 1 tablet (5 mg total) by mouth 2 (two) times daily before a meal. 03/10/15  Yes Schuyler AmorWilliam Pablo Stauffer, MD  glucose blood test strip 1 each by Other route daily. Use as instructed   Yes Historical Provider, MD  ibuprofen (ADVIL,MOTRIN) 800 MG tablet Take 1 tablet (800 mg total) by mouth 3 (three) times daily. 05/02/15  Yes Domenick GongAshley Mortenson, MD  losartan (COZAAR) 100 MG tablet Take 1 tablet (100 mg total) by mouth daily. 04/25/15  Yes Schuyler AmorWilliam Ivo Moga, MD  metFORMIN (GLUCOPHAGE) 500 MG tablet Take 1 tablet (500 mg total) by mouth 2 (two) times daily with a meal. 03/10/15  Yes Schuyler AmorWilliam Jhana Giarratano, MD  metoprolol succinate (TOPROL-XL) 50 MG 24 hr tablet Take 1 tablet (50 mg total) by mouth daily. Take with or immediately following a meal. 02/16/15  Yes Schuyler AmorWilliam Cyprus Kuang, MD  Multiple Vitamins-Minerals (MULTIVITAMIN WITH MINERALS) tablet Take 1 tablet by mouth daily.   Yes Historical Provider, MD  simvastatin (ZOCOR) 40 MG tablet Take 1 tablet (40 mg total) by mouth at bedtime. 07/28/14  Yes Gabriel Cirriheryl Wicker, NP  albuterol (PROVENTIL HFA;VENTOLIN HFA) 108 (90 Base) MCG/ACT inhaler Inhale 2 puffs into the lungs every 6 (six) hours as needed for wheezing or shortness of breath. 05/04/15   Schuyler AmorWilliam Casey Maxfield, MD  doxycycline (VIBRAMYCIN) 100 MG capsule Take 1 capsule (100 mg total) by mouth 2 (two) times daily. 05/04/15   Schuyler AmorWilliam Nyari Olsson, MD  predniSONE (DELTASONE) 10 MG tablet Take 1 tablet (10 mg  total) by mouth 2 (two) times daily with a meal. 05/04/15   Schuyler Amor, MD    Allergies  Allergen Reactions  . Augmentin [Amoxicillin-Pot Clavulanate] Itching  . Benazepril Hcl Cough  . Penicillins Other (See Comments)    Past Surgical History  Procedure Laterality Date  . Hip arthroplasty Right   . Cervical disc surgery    . Total hip arthroplasty Left 05/18/2014    Procedure: LEFT TOTAL HIP ARTHROPLASTY ANTERIOR APPROACH;  Surgeon: Marcene Corning, MD;  Location:  [facility] OR;  Service: Orthopedics;  Laterality: Left;  . Hernia repair  2000    Umblical  . Spine surgery    . Dilation and curettage of uterus  1996  . Abdominal hysterectomy      Social History  Substance Use Topics  . Smoking status: Former Smoker    Quit date: 06/14/1984  . Smokeless tobacco: Never Used  . Alcohol Use: No    Family History  Problem Relation Age of Onset  . Hypertension Mother   . Stroke Mother   . Hyperlipidemia Mother   . Heart disease Mother     heart attack  . Diabetes Father   . Cancer Father     lung  . Diabetes Sister   . Hyperlipidemia Sister   . Stroke Paternal Grandfather   . Hypertension Paternal Grandfather   . Stroke Maternal Grandfather   . Alzheimer's disease Paternal Grandmother   . Diabetes Sister   . Cancer Sister     breast    Medication list has been reviewed and updated.  Physical Examination: BP 118/84 mmHg  Pulse 92  Temp(Src) 99.1 F (37.3 C) (Oral)  Resp 14  Ht 5' (1.524 m)  Wt 137 lb 9.6 oz (62.415 kg)  BMI 26.87 kg/m2  SpO2 97%  LMP  (LMP Unknown)  Physical Exam  Constitutional: She appears well-developed and well-nourished.  HENT:  Right Ear: External ear normal.  Left Ear: External ear normal.  Nose: Nose normal.  Mouth/Throat: Oropharynx is clear and moist.  TM's clear  Eyes: Conjunctivae are normal.  Neck: Neck supple.  Pulmonary/Chest: Effort normal.  Diffuse expiratory wheezes  Musculoskeletal: She exhibits no edema.  Lymphadenopathy:    She has no cervical adenopathy.  Neurological: She is alert.  Skin: Skin is warm and dry.  Psychiatric: She has a normal mood and affect. Her behavior is normal.  Nursing note and vitals reviewed.   Assessment and Plan:  1. Bronchitis with bronchospasm Doxy 100 mg bid x 5d, prednisone 10 mg bid x 5d, albuterol MDI prn, call if sxs worsen/persist  2. Controlled type 2 diabetes mellitus without complication, without long-term current use of insulin (HCC) A1c  7.6 earlier this month, improving  3. Fibromyalgia Well controlled on Cymbalta  4. Parenchymal renal hypertension, stage 1-4 or unspecified chronic kidney disease Well controlled on metoprolol/losartan  5. Vitamin D deficiency Well controlled on supplement  6. Microalbuminuria Recheck MCR next visit on increased losartan dose  7. Hyperlipidemia Adequate control on Zocor/Lofibra  Return in about 3 months (around 08/04/2015), or if symptoms worsen or fail to improve.  Dionne Ano. Kingsley Spittle MD [facility]  05/06/2015 .mmcp

## 2015-05-09 ENCOUNTER — Other Ambulatory Visit: Payer: Self-pay | Admitting: Unknown Physician Specialty

## 2015-05-10 ENCOUNTER — Other Ambulatory Visit: Payer: Self-pay | Admitting: Family Medicine

## 2015-05-10 MED ORDER — SIMVASTATIN 40 MG PO TABS: 40.0000 mg | ORAL_TABLET | Freq: Every day | ORAL | Status: DC

## 2015-07-04 ENCOUNTER — Other Ambulatory Visit: Payer: Self-pay | Admitting: Family Medicine

## 2015-07-04 MED ORDER — GLIPIZIDE 5 MG PO TABS: 5.0000 mg | ORAL_TABLET | Freq: Two times a day (BID) | ORAL | Status: DC

## 2015-07-25 ENCOUNTER — Ambulatory Visit: Payer: BLUE CROSS/BLUE SHIELD | Admitting: Family Medicine

## 2015-07-27 ENCOUNTER — Ambulatory Visit: Payer: BLUE CROSS/BLUE SHIELD | Admitting: Family Medicine

## 2015-07-27 ENCOUNTER — Ambulatory Visit (INDEPENDENT_AMBULATORY_CARE_PROVIDER_SITE_OTHER): Payer: BLUE CROSS/BLUE SHIELD | Admitting: Family Medicine

## 2015-07-27 ENCOUNTER — Encounter: Payer: Self-pay | Admitting: Family Medicine

## 2015-07-27 VITALS — BP 124/88 | HR 90 | Ht 60.0 in | Wt 138.0 lb

## 2015-07-27 DIAGNOSIS — N951 Menopausal and female climacteric states: Secondary | ICD-10-CM | POA: Diagnosis not present

## 2015-07-27 DIAGNOSIS — E785 Hyperlipidemia, unspecified: Secondary | ICD-10-CM

## 2015-07-27 DIAGNOSIS — E663 Overweight: Secondary | ICD-10-CM

## 2015-07-27 DIAGNOSIS — R809 Proteinuria, unspecified: Secondary | ICD-10-CM | POA: Diagnosis not present

## 2015-07-27 DIAGNOSIS — E119 Type 2 diabetes mellitus without complications: Secondary | ICD-10-CM | POA: Diagnosis not present

## 2015-07-27 DIAGNOSIS — M797 Fibromyalgia: Secondary | ICD-10-CM | POA: Diagnosis not present

## 2015-07-27 DIAGNOSIS — I129 Hypertensive chronic kidney disease with stage 1 through stage 4 chronic kidney disease, or unspecified chronic kidney disease: Secondary | ICD-10-CM

## 2015-07-27 DIAGNOSIS — E559 Vitamin D deficiency, unspecified: Secondary | ICD-10-CM | POA: Diagnosis not present

## 2015-07-27 MED ORDER — GABAPENTIN 300 MG PO CAPS: 300.0000 mg | ORAL_CAPSULE | Freq: Every day | ORAL | Status: DC

## 2015-07-27 NOTE — Patient Instructions (Signed)
Menopause and Herbal Products WHAT IS MENOPAUSE? Menopause is the normal time of life when menstrual periods decrease in frequency and eventually stop completely. This process can take several years for some women. Menopause is complete when you have had an absence of menstruation for a full year since your last menstrual period. It usually occurs between the ages of 89 and 23. It is not common for menopause to begin before the age of 107. During menopause, your body stops producing the female hormones estrogen and progesterone. Common symptoms associated with this loss of hormones (vasomotor symptoms) are:  Hot flashes.  Hot flushes.  Night sweats. Other common symptoms and complications of menopause include:  Decrease in sex drive.  Vaginal dryness and thinning of the walls of the vagina. This can make sex painful.  Dryness of the skin and development of wrinkles.  Headaches.  Tiredness.  Irritability.  Memory problems.  Weight gain.  Bladder infections.  Hair growth on the face and chest.  Inability to reproduce offspring (infertility).  Loss of density in the bones (osteoporosis) increasing your risk for breaks (fractures).  Depression.  Hardening and narrowing of the arteries (atherosclerosis). This increases your risk of heart attack and stroke. WHAT TREATMENT OPTIONS ARE AVAILABLE? There are many treatment choices for menopause symptoms. The most common treatment is hormone replacement therapy. Many alternative therapies for menopause are emerging, including the use of herbal products. These supplements can be found in the form of herbs, teas, oils, tinctures, and pills. Common herbal supplements for menopause are made from plants that contain phytoestrogens. Phytoestrogens are compounds that occur naturally in plants and plant products. They act like estrogen in the body. Foods and herbs that contain phytoestrogens include:  Soy.  Flax seeds.  Red  clover.  Ginseng. WHAT MENOPAUSE SYMPTOMS MAY BE HELPED IF I USE HERBAL PRODUCTS?  Vasomotor symptoms. These may be helped by:  Soy. Some studies show that soy may have a moderate benefit for hot flashes.  Black cohosh. There is limited evidence indicating this may be beneficial for hot flashes.  Symptoms that are related to heart and blood vessel disease. These may be helped by soy. Studies have shown that soy can help to lower cholesterol.  Depression. This may be helped by:  St. John's wort. There is limited evidence that shows this may help mild to moderate depression.  Black cohosh. There is evidence that this may help depression and mood swings.  Osteoporosis. Soy may help to decrease bone loss that is associated with menopause and may prevent osteoporosis. Limited evidence indicates that red clover may offer some bone loss protection as well. Other herbal products that are commonly used during menopause lack enough evidence to support their use as a replacement for conventional menopause therapies. These products include evening primrose, ginseng, and red clover. WHAT ARE THE CASES WHEN HERBAL PRODUCTS SHOULD NOT BE USED DURING MENOPAUSE? Do not use herbal products during menopause without your health care provider's approval if:  You are taking medicine.  You have a preexisting liver condition. ARE THERE ANY RISKS IN MY TAKING HERBAL PRODUCTS DURING MENOPAUSE? If you choose to use herbal products to help with symptoms of menopause, keep in mind that:  Different supplements have different and unmeasured amounts of herbal ingredients.  Herbal products are not regulated the same way that medicines are.  Concentrations of herbs may vary depending on the way they are prepared. For example, the concentration may be different in a pill, tea, oil, and  tincture.  Little is known about the risks of using herbal products, particularly the risks of long-term use.  Some herbal  supplements can be harmful when combined with certain medicines. Most commonly reported side effects of herbal products are mild. However, if used improperly, many herbal supplements can cause serious problems. Talk to your health care provider before starting any herbal product. If problems develop, stop taking the supplement and let your health care provider know.   This information is not intended to replace advice given to you by your health care provider. Make sure you discuss any questions you have with your health care provider.   Document Released: 07/18/2007 Document Revised: 02/19/2014 Document Reviewed: 07/14/2013 Elsevier Interactive Patient Education 2016 [employer]  [employer] Interactive Patient Education [employer]

## 2015-07-28 LAB — MICROALBUMIN / CREATININE URINE RATIO
Creatinine, Urine: 92 mg/dL
MICROALB/CREAT RATIO: 135 mg/g{creat} — AB (ref 0.0–30.0)
MICROALBUM., U, RANDOM: 124.2 ug/mL

## 2015-07-28 LAB — HEMOGLOBIN A1C
Est. average glucose Bld gHb Est-mCnc: 197 mg/dL
HEMOGLOBIN A1C: 8.5 % — AB (ref 4.8–5.6)

## 2015-07-29 ENCOUNTER — Other Ambulatory Visit: Payer: Self-pay | Admitting: Family Medicine

## 2015-07-29 MED ORDER — METFORMIN HCL 1000 MG PO TABS: 1000.0000 mg | ORAL_TABLET | Freq: Two times a day (BID) | ORAL | Status: DC

## 2015-07-29 NOTE — Progress Notes (Signed)
Date:  07/27/2015   Name:  Tracy Bryan   DOB:  Feb 04, 1961   MRN:  [id number]  PCP:  Gabriel Cirri, NP    Chief Complaint: Follow-up   History of Present Illness:  This is a 55 y.o. female seen for three month f/u. Bronchitis cleared with abx. DM marginally controlled on metformin/glipizide, last a1c 7.6% in March. Fibromyalgia sxs stable on Cymbalta. Still having hot flashes despite black cohosh, mostly at night, wonders what else she can use. On increased losartan dose due to elevated MCR.   Review of Systems:  Review of Systems  Constitutional: Negative for fever.  Respiratory: Negative for cough and shortness of breath.   Cardiovascular: Negative for chest pain and leg swelling.  Endocrine: Negative for polyuria.  Genitourinary: Negative for difficulty urinating.  Neurological: Negative for syncope and light-headedness.    Patient Active Problem List   Diagnosis Date Noted  . Fibromyalgia 03/11/2015  . Perimenopause 02/17/2015  . Esophageal reflux 02/17/2015  . Overweight (BMI 25.0-29.9) 02/17/2015  . Vitamin D deficiency 02/17/2015  . Microalbuminuria 02/17/2015  . Hyperlipidemia 07/28/2014  . 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 Diabetes mellitus type 2, controlled, without complications (HCC) 05/18/2014    Prior to Admission medications   Medication Sig Start Date End Date Taking? Authorizing Provider  aspirin 81 MG tablet Take 81 mg by mouth daily.   Yes Historical Provider, MD  Cholecalciferol (VITAMIN D) 2000 units CAPS Take 1 capsule (2,000 Units total) by mouth daily. 02/17/15  Yes Schuyler Amor, MD  CRANBERRY PO Take 4,200 mg by mouth daily.   Yes Historical Provider, MD  DULoxetine (CYMBALTA) 60 MG capsule Take 1 capsule (60 mg total) by mouth daily. 03/10/15  Yes Schuyler Amor, MD  fenofibrate micronized (LOFIBRA) 134 MG capsule Take 1 capsule (134 mg total) by mouth daily before breakfast. 02/16/15  Yes Schuyler Amor, MD  FIBER PO Take 1 tablet by  mouth every other day.   Yes Historical Provider, MD  glipiZIDE (GLUCOTROL) 5 MG tablet Take 1 tablet (5 mg total) by mouth 2 (two) times daily before a meal. 07/04/15  Yes Schuyler Amor, MD  glucose blood test strip 1 each by Other route daily. Use as instructed   Yes Historical Provider, MD  losartan (COZAAR) 100 MG tablet Take 1 tablet (100 mg total) by mouth daily. 04/25/15  Yes Schuyler Amor, MD  metFORMIN (GLUCOPHAGE) 500 MG tablet Take 1 tablet (500 mg total) by mouth 2 (two) times daily with a meal. 03/10/15  Yes Schuyler Amor, MD  metoprolol succinate (TOPROL-XL) 50 MG 24 hr tablet Take 1 tablet (50 mg total) by mouth daily. Take with or immediately following a meal. 02/16/15  Yes Schuyler Amor, MD  Multiple Vitamins-Minerals (MULTIVITAMIN WITH MINERALS) tablet Take 1 tablet by mouth daily.   Yes Historical Provider, MD  simvastatin (ZOCOR) 40 MG tablet Take 1 tablet (40 mg total) by mouth daily at 6 PM. 05/10/15  Yes Schuyler Amor, MD  gabapentin (NEURONTIN) 300 MG capsule Take 1 capsule (300 mg total) by mouth at bedtime. 07/27/15   Schuyler Amor, MD    Allergies  Allergen Reactions  . Augmentin [Amoxicillin-Pot Clavulanate] Itching  . Benazepril Hcl Cough  . Penicillins Other (See Comments)    Past Surgical History  Procedure Laterality Date  . Hip arthroplasty Right   . Cervical disc surgery    . Total hip arthroplasty Left 05/18/2014    Procedure: LEFT TOTAL HIP ARTHROPLASTY ANTERIOR APPROACH;  Surgeon: Marcene Corning,  MD;  Location: [facility] OR;  Service: Orthopedics;  Laterality: Left;  . Hernia repair  2000    [facility]  . Spine surgery    . Dilation and curettage of uterus  1996  . Abdominal hysterectomy      Social History  Substance Use Topics  . Smoking status: Former Smoker    Quit date: 06/14/1984  . Smokeless tobacco: Never Used  . Alcohol Use: No    Family History  Problem Relation Age of Onset  . Hypertension Mother   . Stroke Mother   . Hyperlipidemia Mother   .  Heart disease Mother     heart attack  . Diabetes Father   . Cancer Father     lung  . Diabetes Sister   . Hyperlipidemia Sister   . Stroke Paternal Grandfather   . Hypertension Paternal Grandfather   . Stroke Maternal Grandfather   . Alzheimer's disease Paternal Grandmother   . Diabetes Sister   . Cancer Sister     breast    Medication list has been reviewed and updated.  Physical Examination: BP 124/88 mmHg  Pulse 90  Ht 5' (1.524 m)  Wt 138 lb (62.596 kg)  BMI 26.95 kg/m2  LMP  (LMP Unknown)  Physical Exam  Constitutional: She appears well-developed and well-nourished.  Cardiovascular: Normal rate, regular rhythm and normal heart sounds.   Pulmonary/Chest: Effort normal and breath sounds normal.  Musculoskeletal: She exhibits no edema.  Neurological: She is alert.  Skin: Skin is warm and dry.  Psychiatric: She has a normal mood and affect. Her behavior is normal.  Nursing note and vitals reviewed.   Assessment and Plan:  1. Controlled type 2 diabetes mellitus without complication, without long-term current use of insulin (HCC) Marginally controlled on metformin/glipizide - HgB A1c  2. Parenchymal renal hypertension, stage 1-4 or unspecified chronic kidney disease Well controlled on metoprolol/losartan  3. Fibromyalgia Well controlled on Cymbalta  4. Hyperlipidemia Adequate control on Zocor/Lofibra  5. Perimenopause with hot flashes Trial gabapentin 300 mg qhs  6. Microalbuminuria On increased losartan dose - Urine Microalbumin w/creat. ratio  7. Vitamin D deficiency Well controlled on supplement  8. Overweight (BMI 25.0-29.9) Exercise/weight loss discussed  Return in about 3 months (around 10/27/2015).  Dionne AnoWilliam M. Kingsley SpittlePlonk, Jr. MD [facility]  07/29/2015

## 2015-08-01 NOTE — Progress Notes (Signed)
Quick Note:  Called cell and home and left message on both. ______

## 2015-09-01 ENCOUNTER — Other Ambulatory Visit: Payer: Self-pay | Admitting: Internal Medicine

## 2015-09-01 MED ORDER — LOSARTAN POTASSIUM 100 MG PO TABS: 100.0000 mg | ORAL_TABLET | Freq: Every day | ORAL | Status: DC

## 2015-09-06 ENCOUNTER — Other Ambulatory Visit: Payer: Self-pay

## 2015-10-12 ENCOUNTER — Other Ambulatory Visit: Payer: Self-pay

## 2015-11-21 ENCOUNTER — Encounter: Payer: Self-pay | Admitting: Internal Medicine

## 2015-11-23 ENCOUNTER — Encounter: Payer: Self-pay | Admitting: Internal Medicine

## 2015-11-23 ENCOUNTER — Other Ambulatory Visit: Payer: Self-pay | Admitting: Internal Medicine

## 2015-11-23 ENCOUNTER — Ambulatory Visit (INDEPENDENT_AMBULATORY_CARE_PROVIDER_SITE_OTHER): Payer: BLUE CROSS/BLUE SHIELD | Admitting: Internal Medicine

## 2015-11-23 VITALS — BP 122/80 | HR 97 | Resp 16 | Ht 60.0 in | Wt 139.0 lb

## 2015-11-23 DIAGNOSIS — Z1239 Encounter for other screening for malignant neoplasm of breast: Secondary | ICD-10-CM

## 2015-11-23 DIAGNOSIS — N183 Chronic kidney disease, stage 3 (moderate): Secondary | ICD-10-CM | POA: Diagnosis not present

## 2015-11-23 DIAGNOSIS — Z1231 Encounter for screening mammogram for malignant neoplasm of breast: Secondary | ICD-10-CM | POA: Diagnosis not present

## 2015-11-23 DIAGNOSIS — Z1159 Encounter for screening for other viral diseases: Secondary | ICD-10-CM | POA: Diagnosis not present

## 2015-11-23 DIAGNOSIS — E1122 Type 2 diabetes mellitus with diabetic chronic kidney disease: Secondary | ICD-10-CM

## 2015-11-23 DIAGNOSIS — E1165 Type 2 diabetes mellitus with hyperglycemia: Secondary | ICD-10-CM | POA: Diagnosis not present

## 2015-11-23 DIAGNOSIS — E782 Mixed hyperlipidemia: Secondary | ICD-10-CM

## 2015-11-23 DIAGNOSIS — I10 Essential (primary) hypertension: Secondary | ICD-10-CM

## 2015-11-23 DIAGNOSIS — B009 Herpesviral infection, unspecified: Secondary | ICD-10-CM | POA: Diagnosis not present

## 2015-11-23 DIAGNOSIS — IMO0002 Reserved for concepts with insufficient information to code with codable children: Secondary | ICD-10-CM

## 2015-11-23 DIAGNOSIS — E1169 Type 2 diabetes mellitus with other specified complication: Secondary | ICD-10-CM | POA: Diagnosis not present

## 2015-11-23 MED ORDER — VALACYCLOVIR HCL 1 G PO TABS: 1000.0000 mg | ORAL_TABLET | Freq: Two times a day (BID) | ORAL | 0 refills | Status: DC

## 2015-11-23 NOTE — Progress Notes (Signed)
Date:  11/23/2015   Name:  Tracy Bryan   DOB:  07/13/1960   MRN:  [id number]   Chief Complaint: Diabetes (190-200 BS over last few months. C/O Increased Thirst ); Hypertension; and Hyperlipidemia Diabetes  She presents for her follow-up diabetic visit. She has type 2 diabetes mellitus. Her disease course has been worsening. Pertinent negatives for hypoglycemia include no headaches or tremors. Associated symptoms include polydipsia. Pertinent negatives for diabetes include no chest pain, no fatigue and no polyuria. Diabetic complications include nephropathy. Current diabetic treatment includes oral agent (dual therapy). She is compliant with treatment most of the time (has some intermittent diarrhea on higher dose metformin). Her home blood glucose trend is increasing steadily. Her breakfast blood glucose is taken between 7-8 am. Her breakfast blood glucose range is generally 180-200 mg/dl. An ACE inhibitor/angiotensin II receptor blocker is being taken.  Hypertension  This is a chronic problem. The current episode started more than 1 year ago. The problem is unchanged. The problem is controlled. Pertinent negatives include no chest pain, headaches, palpitations or shortness of breath. Identifiable causes of hypertension include chronic renal disease.  Hyperlipidemia  This is a chronic problem. The problem is uncontrolled (very high triglycerides). Exacerbating diseases include chronic renal disease and diabetes. Associated symptoms include myalgias. Pertinent negatives include no chest pain or shortness of breath. Current antihyperlipidemic treatment includes statins (previously on fibric acid). The current treatment provides moderate improvement of lipids.  Diffuse Arthralgia and s/p bilateral hip replacement - on gabapentin and Cymbalta.  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  Current Rx ran out and needs  refill.   Lab Results  Component Value Date   HGBA1C 8.5 (H) 07/27/2015     Review of Systems  Constitutional: Negative for appetite change, fatigue, fever and unexpected weight change.  HENT: Negative for tinnitus and trouble swallowing.   Eyes: Negative for visual disturbance.  Respiratory: Negative for cough, chest tightness and shortness of breath.   Cardiovascular: Negative for chest pain, palpitations and leg swelling.  Gastrointestinal: Negative for abdominal pain.  Endocrine: Positive for polydipsia. Negative for polyuria.  Genitourinary: Negative for dysuria and hematuria.  Musculoskeletal: Positive for myalgias. Negative for arthralgias.  Skin: Negative for color change and rash.  Neurological: Negative for tremors, numbness and headaches.  Psychiatric/Behavioral: Negative for dysphoric mood.    Patient Active Problem List   Diagnosis Date Noted  . Fibromyalgia 03/11/2015  . Perimenopause 02/17/2015  . Esophageal reflux 02/17/2015  . Overweight (BMI 25.0-29.9) 02/17/2015  . Vitamin D deficiency 02/17/2015  . DM type 2 with diabetic mixed hyperlipidemia (HCC) 07/28/2014  . Essential hypertension 07/28/2014  . DM (diabetes mellitus), type 2, uncontrolled, with renal complications (HCC) 05/18/2014    Prior to Admission medications   Medication Sig Start Date End Date Taking? Authorizing Provider  aspirin 81 MG tablet Take 81 mg by mouth daily.    Historical Provider, MD  Cholecalciferol (VITAMIN D) 2000 units CAPS Take 1 capsule (2,000 Units total) by mouth daily. 02/17/15   Schuyler Amor, MD  CRANBERRY PO Take 4,200 mg by mouth daily.    Historical Provider, MD  DULoxetine (CYMBALTA) 60 MG capsule Take 1 capsule (60 mg total) by mouth daily. 03/10/15   Schuyler Amor, MD  FIBER PO Take 1 tablet by mouth every other day.    Historical Provider, MD  gabapentin (NEURONTIN) 300 MG capsule Take 1  capsule (300 mg total) by mouth at bedtime. 07/27/15   Schuyler Amor, MD    glipiZIDE (GLUCOTROL) 5 MG tablet Take 1 tablet (5 mg total) by mouth 2 (two) times daily before a meal. 07/04/15   Schuyler Amor, MD  glucose blood test strip 1 each by Other route daily. Use as instructed    Historical Provider, MD  losartan (COZAAR) 100 MG tablet Take 1 tablet (100 mg total) by mouth daily. 09/01/15   Reubin Milan, MD  metFORMIN (GLUCOPHAGE) 1000 MG tablet Take 1 tablet (1,000 mg total) by mouth 2 (two) times daily with a meal. 07/29/15   Schuyler Amor, MD  metoprolol succinate (TOPROL-XL) 50 MG 24 hr tablet Take 1 tablet (50 mg total) by mouth daily. Take with or immediately following a meal. 02/16/15   Schuyler Amor, MD  Multiple Vitamins-Minerals (MULTIVITAMIN WITH MINERALS) tablet Take 1 tablet by mouth daily.    Historical Provider, MD  simvastatin (ZOCOR) 40 MG tablet Take 1 tablet (40 mg total) by mouth daily at 6 PM. 05/10/15   Schuyler Amor, MD    Allergies  Allergen Reactions  . Augmentin [Amoxicillin-Pot Clavulanate] Itching  . Benazepril Hcl Cough  . Penicillins Other (See Comments)    Past Surgical History:  Procedure Laterality Date  . ABDOMINAL HYSTERECTOMY    . CERVICAL DISC SURGERY    . DILATION AND CURETTAGE OF UTERUS  1996  . HERNIA REPAIR  2000   [facility]  . HIP ARTHROPLASTY Right   . TOTAL HIP ARTHROPLASTY Left 05/18/2014   Procedure: LEFT TOTAL HIP ARTHROPLASTY ANTERIOR APPROACH;  Surgeon: Marcene Corning, MD;  Location: [facility] OR;  Service: Orthopedics;  Laterality: Left;    Social History  Substance Use Topics  . Smoking status: Former Smoker    Quit date: 06/14/1984  . Smokeless tobacco: Never Used  . Alcohol use No     Medication list has been reviewed and updated.   Physical Exam  Constitutional: She is oriented to person, place, and time. She appears well-developed. No distress.  HENT:  Head: Normocephalic and atraumatic.  Neck: Normal range of motion. Neck supple. Carotid bruit is not present. No thyromegaly present.   Cardiovascular: Normal rate, regular rhythm and normal heart sounds.   Pulmonary/Chest: Effort normal and breath sounds normal. No respiratory distress.  Musculoskeletal:       Right hip: She exhibits decreased range of motion.       Left hip: She exhibits decreased range of motion.       Right knee: Normal.       Left knee: Normal.       Feet:  Neurological: She is alert and oriented to person, place, and time. She has normal reflexes.  Foot exam - normal skin, nails, pulses and sensation bilaterally   Skin: Skin is warm and dry. No rash noted.  Psychiatric: She has a normal mood and affect. Her speech is normal and behavior is normal. Thought content normal.  Nursing note and vitals reviewed.   BP 122/80   Pulse 97   Resp 16   Ht 5' (1.524 m)   Wt 139 lb (63 kg)   LMP  (LMP Unknown)   SpO2 99%   BMI 27.15 kg/m   Assessment and Plan: 1. Essential hypertension Controlled - continue current therapy  2. Uncontrolled type 2 diabetes mellitus with stage 3 chronic kidney disease, without long-term current use of insulin (HCC) Consider ER formulation metformin May need to add third agent - Hemoglobin  A1c - Basic metabolic panel  3. DM type 2 with diabetic mixed hyperlipidemia (HCC) Continue statin therapy  4. Breast cancer screening Pt to schedule mammogram  5. Need for hepatitis C screening test - Hepatitis C antibody  6. Herpes simplex infection - valACYclovir (VALTREX) 1000 MG tablet; Take 1 tablet (1,000 mg total) by mouth 2 (two) times daily.  Dispense: 20 tablet; Refill: 0   Bari EdwardLaura Shedric Fredericks, MD [facility]  11/23/2015

## 2015-11-23 NOTE — Patient Instructions (Signed)
Ask eye MD to send report of last exam  Call to schedule mammogram

## 2015-11-24 LAB — BASIC METABOLIC PANEL
BUN/Creatinine Ratio: 25 — ABNORMAL HIGH (ref 9–23)
BUN: 15 mg/dL (ref 6–24)
CALCIUM: 9.7 mg/dL (ref 8.7–10.2)
CHLORIDE: 99 mmol/L (ref 96–106)
CO2: 22 mmol/L (ref 18–29)
Creatinine, Ser: 0.59 mg/dL (ref 0.57–1.00)
GFR calc Af Amer: 118 mL/min/{1.73_m2} (ref >59)
GFR calc non Af Amer: 102 mL/min/{1.73_m2} (ref >59)
GLUCOSE: 141 mg/dL — AB (ref 65–99)
POTASSIUM: 4.7 mmol/L (ref 3.5–5.2)
Sodium: 140 mmol/L (ref 134–144)

## 2015-11-24 LAB — HEPATITIS C ANTIBODY: Hep C Virus Ab: <0.1 s/co ratio (ref 0.0–0.9)

## 2015-11-24 LAB — HEMOGLOBIN A1C
ESTIMATED AVERAGE GLUCOSE: 180 mg/dL
HEMOGLOBIN A1C: 7.9 % — AB (ref 4.8–5.6)

## 2015-11-28 ENCOUNTER — Ambulatory Visit
Admission: RE | Admit: 2015-11-28 | Discharge: 2015-11-28 | Disposition: A | Discharge status: Home / Self Care | Payer: BLUE CROSS/BLUE SHIELD | Source: Ambulatory Visit | Attending: Internal Medicine | Admitting: Internal Medicine

## 2015-11-28 DIAGNOSIS — Z1231 Encounter for screening mammogram for malignant neoplasm of breast: Secondary | ICD-10-CM | POA: Insufficient documentation

## 2015-11-29 ENCOUNTER — Other Ambulatory Visit: Payer: Self-pay | Admitting: *Deleted

## 2015-11-29 ENCOUNTER — Inpatient Hospital Stay
Admission: RE | Admit: 2015-11-29 | Discharge: 2015-11-29 | Disposition: A | Discharge status: Home / Self Care | Payer: Self-pay | Source: Ambulatory Visit | Attending: *Deleted | Admitting: *Deleted

## 2015-11-29 DIAGNOSIS — Z9289 Personal history of other medical treatment: Secondary | ICD-10-CM

## 2015-12-31 ENCOUNTER — Other Ambulatory Visit: Payer: Self-pay | Admitting: Internal Medicine

## 2016-02-25 ENCOUNTER — Other Ambulatory Visit: Payer: Self-pay | Admitting: Family Medicine

## 2016-02-27 NOTE — Telephone Encounter (Signed)
Please forward to Dr. Judithann GravesBerglund

## 2016-02-28 NOTE — Telephone Encounter (Signed)
Per Plonk forwarding this to Dr.Berglund

## 2016-03-19 ENCOUNTER — Ambulatory Visit (INDEPENDENT_AMBULATORY_CARE_PROVIDER_SITE_OTHER): Payer: Managed Care, Other (non HMO) | Admitting: Internal Medicine

## 2016-03-19 ENCOUNTER — Other Ambulatory Visit: Payer: Self-pay | Admitting: Internal Medicine

## 2016-03-19 ENCOUNTER — Encounter: Payer: Self-pay | Admitting: Internal Medicine

## 2016-03-19 VITALS — BP 142/96 | HR 105 | Temp 98.1°F | Ht 60.0 in | Wt 132.0 lb

## 2016-03-19 DIAGNOSIS — E782 Mixed hyperlipidemia: Secondary | ICD-10-CM

## 2016-03-19 DIAGNOSIS — Z0000 Encounter for general adult medical examination without abnormal findings: Secondary | ICD-10-CM | POA: Diagnosis not present

## 2016-03-19 DIAGNOSIS — N183 Chronic kidney disease, stage 3 (moderate): Secondary | ICD-10-CM

## 2016-03-19 DIAGNOSIS — E1122 Type 2 diabetes mellitus with diabetic chronic kidney disease: Secondary | ICD-10-CM

## 2016-03-19 DIAGNOSIS — E1169 Type 2 diabetes mellitus with other specified complication: Secondary | ICD-10-CM

## 2016-03-19 DIAGNOSIS — E559 Vitamin D deficiency, unspecified: Secondary | ICD-10-CM | POA: Diagnosis not present

## 2016-03-19 DIAGNOSIS — E1165 Type 2 diabetes mellitus with hyperglycemia: Secondary | ICD-10-CM | POA: Diagnosis not present

## 2016-03-19 DIAGNOSIS — I10 Essential (primary) hypertension: Secondary | ICD-10-CM

## 2016-03-19 DIAGNOSIS — IMO0002 Reserved for concepts with insufficient information to code with codable children: Secondary | ICD-10-CM

## 2016-03-19 DIAGNOSIS — K219 Gastro-esophageal reflux disease without esophagitis: Secondary | ICD-10-CM

## 2016-03-19 LAB — POCT URINALYSIS DIPSTICK
BILIRUBIN UA: NEGATIVE
GLUCOSE UA: NEGATIVE
Leukocytes, UA: NEGATIVE
NITRITE UA: NEGATIVE
Protein, UA: NEGATIVE
Spec Grav, UA: >=1.03
Urobilinogen, UA: 0.2
pH, UA: 5

## 2016-03-19 MED ORDER — LOSARTAN POTASSIUM 100 MG PO TABS: 100.0000 mg | ORAL_TABLET | Freq: Every day | ORAL | 3 refills | Status: DC

## 2016-03-19 MED ORDER — SITAGLIPTIN PHOSPHATE 100 MG PO TABS: 100.0000 mg | ORAL_TABLET | Freq: Every day | ORAL | Status: DC

## 2016-03-19 MED ORDER — DULOXETINE HCL 60 MG PO CPEP: 60.0000 mg | ORAL_CAPSULE | Freq: Every day | ORAL | 3 refills | Status: DC

## 2016-03-19 MED ORDER — SIMVASTATIN 40 MG PO TABS: 40.0000 mg | ORAL_TABLET | Freq: Every day | ORAL | 3 refills | Status: DC

## 2016-03-19 NOTE — Progress Notes (Signed)
Date:  03/19/2016   Name:  Tracy Bryan   DOB:  03/17/1960   MRN:  [id number]   Chief Complaint: Annual Exam Tracy Bryan is a 56 y.o. female who presents today for her Complete Annual Exam. She feels fairly well. She reports exercising intermittently. She reports she is sleeping well. Recently mammogram was normal.  Pap in 2016 was normal.  Hypertension  This is a chronic problem. The current episode started more than 1 year ago. The problem is controlled (ran out a few months ago - pharmacy did not cooperate). Pertinent negatives include no chest pain, headaches, palpitations or shortness of breath.  Diabetes  She presents for her follow-up diabetic visit. She has type 2 diabetes mellitus. Her disease course has been worsening. Pertinent negatives for hypoglycemia include no dizziness, headaches, nervousness/anxiousness or tremors. Pertinent negatives for diabetes include no chest pain, no fatigue, no polydipsia and no polyuria. She is compliant with treatment most of the time (but has daily diarrhea from metformin). Her weight is fluctuating dramatically. She is following a generally healthy diet. She has not had a previous visit with a dietitian. Her breakfast blood glucose is taken between 6-7 am. Her breakfast blood glucose range is generally 140-180 mg/dl. Her dinner blood glucose is taken between 4-5 pm. Her dinner blood glucose range is generally 180-200 mg/dl. An ACE inhibitor/angiotensin II receptor blocker is being taken.  Gastroesophageal Reflux  She complains of heartburn. She reports no abdominal pain, no chest pain, no coughing or no wheezing. This is a recurrent problem. The problem occurs occasionally. Pertinent negatives include no fatigue.  Hyperlipidemia  This is a chronic problem. The problem is controlled. Pertinent negatives include no chest pain or shortness of breath. Current antihyperlipidemic treatment includes statins. The current treatment provides  significant improvement of lipids.    Lab Results  Component Value Date   HGBA1C 7.9 (H) 11/23/2015    Review of Systems  Constitutional: Negative for appetite change, chills, fatigue, fever and unexpected weight change.  HENT: Negative for congestion, hearing loss, tinnitus, trouble swallowing and voice change.   Eyes: Negative for visual disturbance.  Respiratory: Negative for cough, chest tightness, shortness of breath and wheezing.   Cardiovascular: Negative for chest pain, palpitations and leg swelling.  Gastrointestinal: Positive for heartburn. Negative for abdominal pain, constipation, diarrhea and vomiting.  Endocrine: Negative for polydipsia and polyuria.  Genitourinary: Negative for dysuria, frequency, genital sores, hematuria, vaginal bleeding and vaginal discharge.  Musculoskeletal: Negative for arthralgias, gait problem and joint swelling.  Skin: Negative for color change and rash.  Neurological: Negative for dizziness, tremors, light-headedness, numbness and headaches.  Hematological: Negative for adenopathy. Does not bruise/bleed easily.  Psychiatric/Behavioral: Negative for dysphoric mood and sleep disturbance. The patient is not nervous/anxious.     Patient Active Problem List   Diagnosis Date Noted  . Herpes simplex infection 11/23/2015  . Fibromyalgia 03/11/2015  . Perimenopause 02/17/2015  . Esophageal reflux 02/17/2015  . Overweight (BMI 25.0-29.9) 02/17/2015  . Vitamin D deficiency 02/17/2015  . DM type 2 with diabetic mixed hyperlipidemia (HCC) 07/28/2014  . Essential hypertension 07/28/2014  . DM (diabetes mellitus), type 2, uncontrolled, with renal complications (HCC) 05/18/2014    Prior to Admission medications   Medication Sig Start Date End Date Taking? Authorizing Provider  aspirin 81 MG tablet Take 81 mg by mouth daily.   Yes Historical Provider, MD  Cholecalciferol (VITAMIN D) 2000 units CAPS Take 1 capsule (2,000 Units total) by mouth daily.  02/17/15  Yes Schuyler Amor, MD  CRANBERRY PO Take 4,200 mg by mouth daily.   Yes Historical Provider, MD  DULoxetine (CYMBALTA) 60 MG capsule Take 1 capsule (60 mg total) by mouth daily. 03/10/15  Yes Schuyler Amor, MD  FIBER PO Take 1 tablet by mouth every other day.   Yes Historical Provider, MD  glipiZIDE (GLUCOTROL) 5 MG tablet Take 1 tablet (5 mg total) by mouth 2 (two) times daily before a meal. 07/04/15  Yes Schuyler Amor, MD  glucose blood test strip 1 each by Other route daily. Use as instructed   Yes Historical Provider, MD  losartan (COZAAR) 100 MG tablet TAKE 1 TABLET (100 MG TOTAL) BY MOUTH DAILY. 12/31/15  Yes Reubin Milan, MD  metFORMIN (GLUCOPHAGE) 1000 MG tablet Take 1 tablet (1,000 mg total) by mouth 2 (two) times daily with a meal. 07/29/15  Yes Schuyler Amor, MD  metoprolol succinate (TOPROL-XL) 50 MG 24 hr tablet TAKE 1 TABLET (50 MG TOTAL) BY MOUTH DAILY. TAKE WITH OR IMMEDIATELY FOLLOWING A MEAL. 03/06/16  Yes Reubin Milan, MD  Multiple Vitamins-Minerals (MULTIVITAMIN WITH MINERALS) tablet Take 1 tablet by mouth daily.   Yes Historical Provider, MD  simvastatin (ZOCOR) 40 MG tablet Take 1 tablet (40 mg total) by mouth daily at 6 PM. 05/10/15  Yes Schuyler Amor, MD  valACYclovir (VALTREX) 1000 MG tablet Take 1 tablet (1,000 mg total) by mouth 2 (two) times daily. 11/23/15  Yes Reubin Milan, MD    Allergies  Allergen Reactions  . Augmentin [Amoxicillin-Pot Clavulanate] Itching  . Benazepril Hcl Cough  . Penicillins Other (See Comments)    Past Surgical History:  Procedure Laterality Date  . ABDOMINAL HYSTERECTOMY    . CERVICAL DISC SURGERY    . DILATION AND CURETTAGE OF UTERUS  1996  . HERNIA REPAIR  2000   [facility]  . HIP ARTHROPLASTY Right   . TOTAL HIP ARTHROPLASTY Left 05/18/2014   Procedure: LEFT TOTAL HIP ARTHROPLASTY ANTERIOR APPROACH;  Surgeon: Marcene Corning, MD;  Location: [facility] OR;  Service: Orthopedics;  Laterality: Left;    Social History    Substance Use Topics  . Smoking status: Former Smoker    Quit date: 06/14/1984  . Smokeless tobacco: Never Used  . Alcohol use No   Fall Risk  03/19/2016 07/27/2015 03/10/2015  Falls in the past year? No No No       Medication list has been reviewed and updated.   Physical Exam  Constitutional: She is oriented to person, place, and time. She appears well-developed and well-nourished. No distress.  HENT:  Head: Normocephalic and atraumatic.  Right Ear: Tympanic membrane and ear canal normal.  Left Ear: Tympanic membrane and ear canal normal.  Nose: Right sinus exhibits no maxillary sinus tenderness. Left sinus exhibits no maxillary sinus tenderness.  Mouth/Throat: Uvula is midline and oropharynx is clear and moist.  Eyes: Conjunctivae and EOM are normal. Right eye exhibits no discharge. Left eye exhibits no discharge. No scleral icterus.  Neck: Normal range of motion. Carotid bruit is not present. No erythema present. No thyromegaly present.  Cardiovascular: Normal rate, regular rhythm, normal heart sounds and normal pulses.   Pulmonary/Chest: Effort normal. No respiratory distress. She has no wheezes. Right breast exhibits no mass, no nipple discharge, no skin change and no tenderness. Left breast exhibits no mass, no nipple discharge, no skin change and no tenderness.  Abdominal: Soft. Bowel sounds are normal. There is no hepatosplenomegaly. There is no tenderness. There is  no CVA tenderness.  Musculoskeletal: Normal range of motion.  Lymphadenopathy:    She has no cervical adenopathy.    She has no axillary adenopathy.  Neurological: She is alert and oriented to person, place, and time. She has normal reflexes. No cranial nerve deficit or sensory deficit.  Skin: Skin is warm, dry and intact. No rash noted.  Psychiatric: She has a normal mood and affect. Her speech is normal and behavior is normal. Thought content normal.  Nursing note and vitals reviewed.   BP (!) 142/96   Pulse  (!) 105   Temp 98.1 F (36.7 C)   Ht 5' (1.524 m)   Wt 132 lb (59.9 kg)   LMP  (LMP Unknown)   SpO2 98%   BMI 25.78 kg/m   Assessment and Plan: 1. Annual physical exam Normal exam - POCT urinalysis dipstick  2. Essential hypertension Not controlled due to medication non compliance - losartan (COZAAR) 100 MG tablet; Take 1 tablet (100 mg total) by mouth daily.  Dispense: 90 tablet; Refill: 3 - CBC with Differential/Platelet - Comprehensive metabolic panel  3. Gastroesophageal reflux disease, esophagitis presence not specified Stable on Prevacid otc - CBC with Differential/Platelet  4. Uncontrolled type 2 diabetes mellitus with stage 3 chronic kidney disease, without long-term current use of insulin (HCC) Reduce dose of metformin to 500 mg bid Will add Januvia 100 mg (samples) - DULoxetine (CYMBALTA) 60 MG capsule; Take 1 capsule (60 mg total) by mouth daily.  Dispense: 90 capsule; Refill: 3 - sitaGLIPtin (JANUVIA) 100 MG tablet; Take 1 tablet (100 mg total) by mouth daily.  Dispense: 28 tablet - Comprehensive metabolic panel - Hemoglobin A1c - TSH - Microalbumin / creatinine urine ratio - Amb Referral to Nutrition and Diabetic E  5. DM type 2 with diabetic mixed hyperlipidemia (HCC) - simvastatin (ZOCOR) 40 MG tablet; Take 1 tablet (40 mg total) by mouth daily at 6 PM.  Dispense: 90 tablet; Refill: 3 - Lipid panel  6. Vitamin D deficiency Continue supplementation   Bari EdwardLaura Armando Lauman, MD [facility]  03/19/2016

## 2016-03-19 NOTE — Patient Instructions (Addendum)
Cut metformin in half to take 500 mg twice a day  DASH Eating Plan DASH stands for "Dietary Approaches to Stop Hypertension." The DASH eating plan is a healthy eating plan that has been shown to reduce high blood pressure (hypertension). Additional health benefits may include reducing the risk of type 2 diabetes mellitus, heart disease, and stroke. The DASH eating plan may also help with weight loss. What do I need to know about the DASH eating plan? For the DASH eating plan, you will follow these general guidelines:  Choose foods with less than 150 milligrams of sodium per serving (as listed on the food label).  Use salt-free seasonings or herbs instead of table salt or sea salt.  Check with your health care provider or pharmacist before using salt substitutes.  Eat lower-sodium products. These are often labeled as "low-sodium" or "no salt added."  Eat fresh foods. Avoid eating a lot of canned foods.  Eat more vegetables, fruits, and low-fat dairy products.  Choose whole grains. Look for the word "whole" as the first word in the ingredient list.  Choose fish and skinless chicken or [place] more often than red meat. Limit fish, poultry, and meat to 6 oz (170 g) each day.  Limit sweets, desserts, sugars, and sugary drinks.  Choose heart-healthy fats.  Eat more home-cooked food and less restaurant, buffet, and fast food.  Limit fried foods.  Do not fry foods. Cook foods using methods such as baking, boiling, grilling, and broiling instead.  When eating at a restaurant, ask that your food be prepared with less salt, or no salt if possible. What foods can I eat? Seek help from a dietitian for individual calorie needs. Grains  Whole grain or whole wheat bread. Brown rice. Whole grain or whole wheat pasta. Quinoa, bulgur, and whole grain cereals. Low-sodium cereals. Corn or whole wheat flour tortillas. Whole grain cornbread. Whole grain crackers. Low-sodium crackers. Vegetables  Fresh  or frozen vegetables (raw, steamed, roasted, or grilled). Low-sodium or reduced-sodium tomato and vegetable juices. Low-sodium or reduced-sodium tomato sauce and paste. Low-sodium or reduced-sodium canned vegetables. Fruits  All fresh, canned (in natural juice), or frozen fruits. Meat and Other Protein Products  Ground beef (85% or leaner), grass-fed beef, or beef trimmed of fat. Skinless chicken or [place]. Ground chicken or [place]. Pork trimmed of fat. All fish and seafood. Eggs. Dried beans, peas, or lentils. Unsalted nuts and seeds. Unsalted canned beans. Dairy  Low-fat dairy products, such as skim or 1% milk, 2% or reduced-fat cheeses, low-fat ricotta or cottage cheese, or plain low-fat yogurt. Low-sodium or reduced-sodium cheeses. Fats and Oils  Tub margarines without trans fats. Light or reduced-fat mayonnaise and salad dressings (reduced sodium). Avocado. Safflower, olive, or canola oils. Natural peanut or almond butter. Other  Unsalted popcorn and pretzels. The items listed above may not be a complete list of recommended foods or beverages. Contact your dietitian for more options.  What foods are not recommended? Grains  White bread. White pasta. White rice. Refined cornbread. Bagels and croissants. Crackers that contain trans fat. Vegetables  Creamed or fried vegetables. Vegetables in a cheese sauce. Regular canned vegetables. Regular canned tomato sauce and paste. Regular tomato and vegetable juices. Fruits  Canned fruit in light or heavy syrup. Fruit juice. Meat and Other Protein Products  Fatty cuts of meat. Ribs, chicken wings, bacon, sausage, bologna, salami, chitterlings, fatback, hot dogs, bratwurst, and packaged luncheon meats. Salted nuts and seeds. Canned beans with salt. Dairy  Whole or 2%  milk, cream, half-and-half, and cream cheese. Whole-fat or sweetened yogurt. Full-fat cheeses or blue cheese. Nondairy creamers and whipped toppings. Processed cheese, cheese spreads, or  cheese curds. Condiments  Onion and garlic salt, seasoned salt, table salt, and sea salt. Canned and packaged gravies. Worcestershire sauce. Tartar sauce. Barbecue sauce. Teriyaki sauce. Soy sauce, including reduced sodium. Steak sauce. Fish sauce. Oyster sauce. Cocktail sauce. Horseradish. Ketchup and mustard. Meat flavorings and tenderizers. Bouillon cubes. Hot sauce. Tabasco sauce. Marinades. Taco seasonings. Relishes. Fats and Oils  Butter, stick margarine, lard, shortening, ghee, and bacon fat. Coconut, palm kernel, or palm oils. Regular salad dressings. Other  Pickles and olives. Salted popcorn and pretzels. The items listed above may not be a complete list of foods and beverages to avoid. Contact your dietitian for more information.  Where can I find more information? National Heart, Lung, and Blood Institute: CablePromo.itwww.nhlbi.nih.gov/health/health-topics/topics/dash/ This information is not intended to replace advice given to you by your health care provider. Make sure you discuss any questions you have with your health care provider. Document Released: 01/18/2011 Document Revised: 07/07/2015 Document Reviewed: 12/03/2012 Elsevier Interactive Patient Education  2017 [employer]

## 2016-03-20 LAB — LIPID PANEL
CHOLESTEROL TOTAL: 224 mg/dL — AB (ref 100–199)
Chol/HDL Ratio: 4.8 ratio units — ABNORMAL HIGH (ref 0.0–4.4)
HDL: 47 mg/dL (ref >39)
TRIGLYCERIDES: 644 mg/dL — AB (ref 0–149)

## 2016-03-20 LAB — CBC WITH DIFFERENTIAL/PLATELET
BASOS ABS: 0.1 10*3/uL (ref 0.0–0.2)
Basos: 2 %
EOS (ABSOLUTE): 0.4 10*3/uL (ref 0.0–0.4)
EOS: 5 %
HEMATOCRIT: 43.8 % (ref 34.0–46.6)
Hemoglobin: 14.5 g/dL (ref 11.1–15.9)
Immature Grans (Abs): 0 10*3/uL (ref 0.0–0.1)
Immature Granulocytes: 1 %
LYMPHS ABS: 2.5 10*3/uL (ref 0.7–3.1)
Lymphs: 34 %
MCH: 27.9 pg (ref 26.6–33.0)
MCHC: 33.1 g/dL (ref 31.5–35.7)
MCV: 84 fL (ref 79–97)
MONOS ABS: 0.6 10*3/uL (ref 0.1–0.9)
Monocytes: 8 %
Neutrophils Absolute: 3.9 10*3/uL (ref 1.4–7.0)
Neutrophils: 50 %
Platelets: 282 10*3/uL (ref 150–379)
RBC: 5.2 x10E6/uL (ref 3.77–5.28)
RDW: 15.2 % (ref 12.3–15.4)
WBC: 7.6 10*3/uL (ref 3.4–10.8)

## 2016-03-20 LAB — COMPREHENSIVE METABOLIC PANEL
ALK PHOS: 86 IU/L (ref 39–117)
ALT: 36 IU/L — ABNORMAL HIGH (ref 0–32)
AST: 39 IU/L (ref 0–40)
Albumin/Globulin Ratio: 1.6 (ref 1.2–2.2)
Albumin: 4.4 g/dL (ref 3.5–5.5)
BUN/Creatinine Ratio: 16 (ref 9–23)
BUN: 13 mg/dL (ref 6–24)
Bilirubin Total: 0.3 mg/dL (ref 0.0–1.2)
CHLORIDE: 96 mmol/L (ref 96–106)
CO2: 21 mmol/L (ref 18–29)
CREATININE: 0.81 mg/dL (ref 0.57–1.00)
Calcium: 9.7 mg/dL (ref 8.7–10.2)
GFR calc Af Amer: 93 mL/min/{1.73_m2} (ref >59)
GFR calc non Af Amer: 81 mL/min/{1.73_m2} (ref >59)
GLOBULIN, TOTAL: 2.8 g/dL (ref 1.5–4.5)
Glucose: 167 mg/dL — ABNORMAL HIGH (ref 65–99)
POTASSIUM: 4.6 mmol/L (ref 3.5–5.2)
SODIUM: 135 mmol/L (ref 134–144)
Total Protein: 7.2 g/dL (ref 6.0–8.5)

## 2016-03-20 LAB — TSH: TSH: 1.44 u[IU]/mL (ref 0.450–4.500)

## 2016-03-20 LAB — HEMOGLOBIN A1C
Est. average glucose Bld gHb Est-mCnc: 177 mg/dL
Hgb A1c MFr Bld: 7.8 % — ABNORMAL HIGH (ref 4.8–5.6)

## 2016-03-21 ENCOUNTER — Other Ambulatory Visit: Payer: Self-pay | Admitting: Internal Medicine

## 2016-03-21 MED ORDER — ATORVASTATIN CALCIUM 40 MG PO TABS: 40.0000 mg | ORAL_TABLET | Freq: Every day | ORAL | 1 refills | Status: DC

## 2016-03-22 LAB — MICROALBUMIN / CREATININE URINE RATIO
CREATININE, UR: 230.9 mg/dL
MICROALBUM., U, RANDOM: 5920.3 ug/mL
Microalb/Creat Ratio: 2564 mg/g creat — ABNORMAL HIGH (ref 0.0–30.0)

## 2016-04-05 ENCOUNTER — Ambulatory Visit: Payer: Medicare Other | Admitting: Dietician

## 2016-04-12 ENCOUNTER — Other Ambulatory Visit: Payer: Self-pay | Admitting: Internal Medicine

## 2016-04-12 ENCOUNTER — Encounter: Payer: Self-pay | Admitting: Dietician

## 2016-04-12 ENCOUNTER — Telehealth: Payer: Self-pay

## 2016-04-12 ENCOUNTER — Encounter: Payer: Medicare Other | Attending: Internal Medicine | Admitting: Dietician

## 2016-04-12 VITALS — Ht 60.0 in | Wt 132.8 lb

## 2016-04-12 DIAGNOSIS — E1165 Type 2 diabetes mellitus with hyperglycemia: Secondary | ICD-10-CM | POA: Insufficient documentation

## 2016-04-12 DIAGNOSIS — Z713 Dietary counseling and surveillance: Secondary | ICD-10-CM | POA: Insufficient documentation

## 2016-04-12 DIAGNOSIS — E1122 Type 2 diabetes mellitus with diabetic chronic kidney disease: Secondary | ICD-10-CM

## 2016-04-12 DIAGNOSIS — N183 Chronic kidney disease, stage 3 (moderate): Secondary | ICD-10-CM | POA: Insufficient documentation

## 2016-04-12 DIAGNOSIS — IMO0002 Reserved for concepts with insufficient information to code with codable children: Secondary | ICD-10-CM

## 2016-04-12 MED ORDER — SITAGLIPTIN PHOSPHATE 100 MG PO TABS: 100.0000 mg | ORAL_TABLET | Freq: Every day | ORAL | 4 refills | Status: DC

## 2016-04-12 NOTE — Progress Notes (Signed)
Medical Nutrition Therapy: Visit start time: 9:00am   end time: 10:10 Assessment:  Diagnosis: Diabetes, Kidney disease Past medical history: hyperlipidemia and hypertension Psychosocial issues/ stress concerns: Patient rates her stress as moderate and indicates "ok" as to how well she is dealing with her stress. Preferred learning method:  . Auditory  Current weight: 132.8 lbs  Height: 60 in Medications, supplements: see list Progress and evaluation:  Patient in for initial medical nutrition therapy appointment. She states she wants to learn more about carbohydrate servings and healthy eating in general. She eats most of her breakfast and dinner meals "out" as well as 2-3 of her evening meals making it difficult to control fat and sodium. Her main beverage is water but she typically drinks 16 ounces of regular soda daily. Her most recent HgA1c was 7.8 and her triglycerides and   Microalb/creatinine ratio were significantly elevated. Physical activity: none  Dietary Intake:  Usual eating pattern includes 3 meals and 0-1 snacks per day. Dining out frequency: 12 meals per week.  Breakfast: egg/cheese biscuit, coffee, or egg sandwich, applesauce or egg Mcmuffin Snack: typically no am snack Lunch: 12:30- vegetables + mac and cheese at [employer] or canned potato soup or cheese dogs as examples Snack: typically no snack Supper: 5:30am- steak, rice, vegetables or canned soup , 16 oz soda Snack: 2-3 x per week- ice cream or "Little Debbie" cake Beverages: water, coffee, 16 oz soda daily  Nutrition Care Education: Diabetes:  goals for BGs, appropriate meal and snack schedule, Carb counting, appropriate carb intake and balance. Instructed on a meal plan based on 1400 calories. Discussed importance of glucose control related to kidney disease. Hypertension:  importance of controlling BP, identifying high sodium foods, and instructed on pratical ways to decrease. Dietary guidelines for hypertension.  Ways to adjust recipes to lower sodium content. Stressed importance of controlling blood pressure related to kidney health. Hyperlipidemia: Sources of saturated and trans fat and ways to decrease in the diet.  Other lifestyle changes:  Discussed importance of structured exercise for diabetes control as well as improving blood pressure and decreasing triglycerides.  Nutritional Diagnosis:  NI-5.11.2 Predicted excessive nutrient intake As related to excessive sodium and saturated fat especially with restaurant foods.  As evidenced by diet history..  Intervention:  Avoid sugar sweetened beverages. Try stevia (usually in green box or packages) in tea. Limit sweets to several times per week in 15-20 gm carbohydrate portions. Balance meals with 1-3 ounces of protein, 2-3 servings of carbohydrate and "free vegetables". Use food guide plate as a reminder on how to better balance meals. Take advantage of your love of vegetables to help increase nutrients, fiber and feeling of fullness at meals without eating excessive carbohydrate. Read labels for saturated fat (no more than 10 gms per day), trans fat (0 grams) and carbohydrate and sodium (goal of no more than 1532m/day) Prepare more recipes at home. Ex. Make potato soup with low sodium chicken broth to significantly decrease sodium. Make pasta sauces with no salt added tomato sauce, no salt added tomatoes, basil, oregano and parsley for example. Use websites or phone apps such as calorieking.com to look up nutrition information especially for meals eaten "out". Start some structured exercise 2 times per week - walking for 30 minutes.  Education Materials given:  . General diet guidelines for Cholesterol-lowering/ Heart health . General diet guidelines for Hypertension . Plate Planner . Food lists/ Planning A Balanced Meal . Sample meal pattern/ menus . Goals/ instructions Learner/ who was taught:  .  Patient  Level of understanding: . Partial  understanding; needs review/ practice Demonstrated degree of understanding via:   Teach back Learning barriers: . None  Willingness to learn/ readiness for change: . Acceptance, ready for change Monitoring and Evaluation:  Dietary intake, exercise, , and body weight      follow up: 05/15/16 at 9:00am

## 2016-04-12 NOTE — Telephone Encounter (Signed)
Pt called stating she was given samples of Januvia and would like to continue taking. Wants RX of Januvia sent [facility] in [place].

## 2016-04-12 NOTE — Telephone Encounter (Signed)
Done

## 2016-04-12 NOTE — Telephone Encounter (Signed)
Called to inform pt.

## 2016-04-12 NOTE — Patient Instructions (Signed)
Avoid sugar sweetened beverages. Try stevia (usually in green box or packages) in tea. Limit sweets to several times per week in 15-20 gm carbohydrate portions. Balance meals with 1-3 ounces of protein, 2-3 servings of carbohydrate and "free vegetables". Use food guide plate as a reminder on how to better balance meals. Take advantage of your love of vegetables to help increase nutrients, fiber and feeling of fullness at meals without eating excessive carbohydrate. Read labels for saturated fat (no more than 10 gms per day), trans fat (0 grams) and carbohydrate and sodium (goal of no more than 1500mg /day) Prepare more recipes at home. Ex. Make potato soup with low sodium chicken broth to significantly decrease sodium. Make pasta sauces with no salt added tomato sauce, no salt added tomatoes, basil, oregano and parsley for example. Use websites or phone apps such as calorieking.com to look up nutrition information especially for meals eaten "out". Start some structured exercise 2 times per week - walking for 30 minutes.

## 2016-04-18 ENCOUNTER — Other Ambulatory Visit: Payer: Self-pay | Admitting: Family Medicine

## 2016-05-15 ENCOUNTER — Ambulatory Visit: Payer: Medicare Other | Admitting: Dietician

## 2016-05-24 ENCOUNTER — Encounter: Payer: Self-pay | Admitting: Dietician

## 2016-05-26 ENCOUNTER — Other Ambulatory Visit: Payer: Self-pay | Admitting: Internal Medicine

## 2016-06-05 ENCOUNTER — Ambulatory Visit
Admission: EM | Admit: 2016-06-05 | Discharge: 2016-06-05 | Disposition: A | Discharge status: Home / Self Care | Payer: Managed Care, Other (non HMO) | Attending: Family Medicine | Admitting: Family Medicine

## 2016-06-05 DIAGNOSIS — J0100 Acute maxillary sinusitis, unspecified: Secondary | ICD-10-CM | POA: Diagnosis not present

## 2016-06-05 DIAGNOSIS — R05 Cough: Secondary | ICD-10-CM | POA: Diagnosis not present

## 2016-06-05 DIAGNOSIS — R059 Cough, unspecified: Secondary | ICD-10-CM

## 2016-06-05 MED ORDER — HYDROCOD POLST-CPM POLST ER 10-8 MG/5ML PO SUER: 5.0000 mL | Freq: Two times a day (BID) | ORAL | 0 refills | Status: DC | PRN

## 2016-06-05 MED ORDER — DOXYCYCLINE HYCLATE 100 MG PO TABS: 100.0000 mg | ORAL_TABLET | Freq: Two times a day (BID) | ORAL | 0 refills | Status: DC

## 2016-06-05 NOTE — ED Provider Notes (Signed)
[facility]    CSN: [id number] Arrival date & time: 06/05/16  9147     History   Chief Complaint Chief Complaint  Patient presents with  . Cough    HPI RHODIE CIENFUEGOS is a 56 y.o. female.   The history is provided by the patient.  Cough  Associated symptoms: fever and headaches   Associated symptoms: no wheezing   URI  Presenting symptoms: congestion, cough, facial pain, fatigue and fever   Severity:  Moderate Onset quality:  Sudden Duration:  7 days Timing:  Constant Progression:  Worsening Chronicity:  New Relieved by:  Nothing Ineffective treatments:  OTC medications Associated symptoms: headaches and sinus pain   Associated symptoms: no wheezing   Risk factors: diabetes mellitus   Risk factors: not elderly, no chronic cardiac disease, no chronic kidney disease, no chronic respiratory disease, no immunosuppression, no recent illness, no recent travel and no sick contacts     Past Medical History:  Diagnosis Date  . Arthritis   . Diabetes mellitus without complication (HCC)    Type 2  . Disseminated intravascular coagulation (HCC) 1999   After child birth  . GERD (gastroesophageal reflux disease)   . Herpes simplex   . Hypercholesteremia   . Hypertension   . Migraines   . Pneumonia   . PONV (postoperative nausea and vomiting)     Patient Active Problem List   Diagnosis Date Noted  . Herpes simplex infection 11/23/2015  . Fibromyalgia 03/11/2015  . Perimenopause 02/17/2015  . Esophageal reflux 02/17/2015  . Overweight (BMI 25.0-29.9) 02/17/2015  . Vitamin D deficiency 02/17/2015  . DM type 2 with diabetic mixed hyperlipidemia (HCC) 07/28/2014  . Essential hypertension 07/28/2014  . DM (diabetes mellitus), type 2, uncontrolled, with renal complications (HCC) 05/18/2014    Past Surgical History:  Procedure Laterality Date  . ABDOMINAL HYSTERECTOMY    . CERVICAL DISC SURGERY    . DILATION AND CURETTAGE OF UTERUS  1996  . HERNIA  REPAIR  2000   Umblical  . HIP ARTHROPLASTY Right   . TOTAL HIP ARTHROPLASTY Left 05/18/2014   Procedure: LEFT TOTAL HIP ARTHROPLASTY ANTERIOR APPROACH;  Surgeon: Marcene Corning, MD;  Location: [facility] OR;  Service: Orthopedics;  Laterality: Left;    OB History    No data available       Home Medications    Prior to Admission medications   Medication Sig Start Date End Date Taking? Authorizing Provider  aspirin 81 MG tablet Take 81 mg by mouth daily.   Yes Historical Provider, MD  atorvastatin (LIPITOR) 40 MG tablet Take 1 tablet (40 mg total) by mouth daily. 03/21/16  Yes Reubin Milan, MD  Cholecalciferol (VITAMIN D) 2000 units CAPS Take 1 capsule (2,000 Units total) by mouth daily. 02/17/15  Yes Schuyler Amor, MD  DULoxetine (CYMBALTA) 60 MG capsule Take 1 capsule (60 mg total) by mouth daily. 03/19/16  Yes Reubin Milan, MD  glipiZIDE (GLUCOTROL) 5 MG tablet Take 1 tablet (5 mg total) by mouth 2 (two) times daily before a meal. 07/04/15  Yes Schuyler Amor, MD  lansoprazole (PREVACID) 15 MG capsule Take 15 mg by mouth daily as needed.   Yes Historical Provider, MD  losartan (COZAAR) 100 MG tablet Take 1 tablet (100 mg total) by mouth daily. 03/19/16  Yes Reubin Milan, MD  metFORMIN (GLUCOPHAGE) 1000 MG tablet Take 1 tablet (1,000 mg total) by mouth 2 (two) times daily with a meal. 07/29/15  Yes Schuyler Amor,  MD  metoprolol succinate (TOPROL-XL) 50 MG 24 hr tablet TAKE 1 TABLET (50 MG TOTAL) BY MOUTH DAILY. TAKE WITH OR IMMEDIATELY FOLLOWING A MEAL. 03/06/16  Yes Reubin Milan, MD  Multiple Vitamins-Minerals (MULTIVITAMIN WITH MINERALS) tablet Take 1 tablet by mouth daily.   Yes Historical Provider, MD  simvastatin (ZOCOR) 40 MG tablet Take 40 mg by mouth daily.   Yes Historical Provider, MD  sitaGLIPtin (JANUVIA) 100 MG tablet Take 1 tablet (100 mg total) by mouth daily. 04/12/16  Yes Reubin Milan, MD  valACYclovir (VALTREX) 1000 MG tablet Take 1 tablet (1,000 mg total) by mouth 2 (two)  times daily. 11/23/15  Yes Reubin Milan, MD  chlorpheniramine-HYDROcodone [facility] PENNKINETIC ER) 10-8 MG/5ML SUER Take 5 mLs by mouth every 12 (twelve) hours as needed. 06/05/16   Payton Mccallum, MD  doxycycline (VIBRA-TABS) 100 MG tablet Take 1 tablet (100 mg total) by mouth 2 (two) times daily. 06/05/16   Payton Mccallum, MD  DULoxetine (CYMBALTA) 60 MG capsule TAKE 1 CAPSULE (60 MG TOTAL) BY MOUTH DAILY. 05/26/16   Reubin Milan, MD  glucose blood test strip 1 each by Other route daily. Use as instructed    Historical Provider, MD  metFORMIN (GLUCOPHAGE) 1000 MG tablet TAKE 1 TABLET (1,000 MG TOTAL) BY MOUTH 2 (TWO) TIMES DAILY WITH A MEAL. 04/18/16   Reubin Milan, MD  simvastatin (ZOCOR) 40 MG tablet TAKE 1 TABLET (40 MG TOTAL) BY MOUTH DAILY AT 6 PM. 05/26/16   Reubin Milan, MD    Family History Family History  Problem Relation Age of Onset  . Hypertension Mother   . Stroke Mother   . Hyperlipidemia Mother   . Heart disease Mother     heart attack  . Diabetes Father   . Cancer Father     lung  . Diabetes Sister   . Hyperlipidemia Sister   . Stroke Paternal Grandfather   . Hypertension Paternal Grandfather   . Stroke Maternal Grandfather   . Alzheimer's disease Paternal Grandmother   . Diabetes Sister   . Cancer Sister     breast  . Breast cancer Sister 17  . Breast cancer Paternal Aunt     Social History Social History  Substance Use Topics  . Smoking status: Former Smoker    Quit date: 06/14/1984  . Smokeless tobacco: Never Used  . Alcohol use No     Allergies   Augmentin [amoxicillin-pot clavulanate]; Benazepril hcl; and Penicillins   Review of Systems Review of Systems  Constitutional: Positive for fatigue and fever.  HENT: Positive for congestion and sinus pain.   Respiratory: Positive for cough. Negative for wheezing.   Neurological: Positive for headaches.     Physical Exam Triage Vital Signs ED Triage Vitals  Enc Vitals Group     BP  06/05/16 1006 (!) 160/92     Pulse Rate 06/05/16 1006 77     Resp 06/05/16 1006 18     Temp 06/05/16 1006 98.6 F (37 C)     Temp Source 06/05/16 1006 Oral     SpO2 06/05/16 1006 99 %     Weight 06/05/16 1003 130 lb (59 kg)     Height 06/05/16 1003 5' (1.524 m)     Head Circumference --      Peak Flow --      Pain Score 06/05/16 1004 6     Pain Loc --      Pain Edu? --  Excl. in GC? --    No data found.   Updated Vital Signs BP (!) 160/92 (BP Location: Left Arm)   Pulse 77   Temp 98.6 F (37 C) (Oral)   Resp 18   Ht 5' (1.524 m)   Wt 130 lb (59 kg)   LMP  (LMP Unknown)   SpO2 99%   BMI 25.39 kg/m   Visual Acuity Right Eye Distance:   Left Eye Distance:   Bilateral Distance:    Right Eye Near:   Left Eye Near:    Bilateral Near:     Physical Exam  Constitutional: She appears well-developed and well-nourished. No distress.  HENT:  Head: Normocephalic and atraumatic.  Right Ear: Tympanic membrane, external ear and ear canal normal.  Left Ear: Tympanic membrane, external ear and ear canal normal.  Nose: Mucosal edema and rhinorrhea present. No nose lacerations, sinus tenderness, nasal deformity, septal deviation or nasal septal hematoma. No epistaxis.  No foreign bodies. Right sinus exhibits maxillary sinus tenderness and frontal sinus tenderness. Left sinus exhibits maxillary sinus tenderness and frontal sinus tenderness.  Mouth/Throat: Uvula is midline, oropharynx is clear and moist and mucous membranes are normal. No oropharyngeal exudate.  Eyes: Conjunctivae and EOM are normal. Pupils are equal, round, and reactive to light. Right eye exhibits no discharge. Left eye exhibits no discharge. No scleral icterus.  Neck: Normal range of motion. Neck supple. No thyromegaly present.  Cardiovascular: Normal rate, regular rhythm and normal heart sounds.   Pulmonary/Chest: Effort normal and breath sounds normal. No respiratory distress. She has no wheezes. She has no  rales.  Lymphadenopathy:    She has no cervical adenopathy.  Skin: She is not diaphoretic.  Nursing note and vitals reviewed.    UC Treatments / Results  Labs (all labs ordered are listed, but only abnormal results are displayed) Labs Reviewed - No data to display  EKG  EKG Interpretation None       Radiology No results found.  Procedures Procedures (including critical care time)  Medications Ordered in UC Medications - No data to display   Initial Impression / Assessment and Plan / UC Course  I have reviewed the triage vital signs and the nursing notes.  Pertinent labs & imaging results that were available during my care of the patient were reviewed by me and considered in my medical decision making (see chart for details).      Final Clinical Impressions(s) / UC Diagnoses   Final diagnoses:  Acute maxillary sinusitis, recurrence not specified  Cough    New Prescriptions Discharge Medication List as of 06/05/2016 10:38 AM    START taking these medications   Details  chlorpheniramine-HYDROcodone (TUSSIONEX PENNKINETIC ER) 10-8 MG/5ML SUER Take 5 mLs by mouth every 12 (twelve) hours as needed., Starting Tue 06/05/2016, Normal    doxycycline (VIBRA-TABS) 100 MG tablet Take 1 tablet (100 mg total) by mouth 2 (two) times daily., Starting Tue 06/05/2016, Normal      1.diagnosis reviewed with patient 2. rx as per orders above; reviewed possible side effects, interactions, risks and benefits  3. Recommend supportive treatment with otc flonase 4. Follow-up prn if symptoms worsen or don't improve   Payton Mccallum, MD 06/05/16 1105

## 2016-06-05 NOTE — ED Triage Notes (Signed)
Pt c/o productive cough, body ache, nasal drainage, and headache for about a week.

## 2016-06-21 ENCOUNTER — Other Ambulatory Visit: Payer: Self-pay | Admitting: Internal Medicine

## 2016-07-01 ENCOUNTER — Other Ambulatory Visit: Payer: Self-pay | Admitting: Internal Medicine

## 2016-07-27 ENCOUNTER — Other Ambulatory Visit: Payer: Self-pay

## 2016-07-27 MED ORDER — GLIPIZIDE 5 MG PO TABS
5.0000 mg | ORAL_TABLET | Freq: Two times a day (BID) | ORAL | 0 refills | Status: DC
Start: 2016-07-27 — End: 2016-10-24

## 2016-07-30 ENCOUNTER — Encounter: Payer: Self-pay | Admitting: Emergency Medicine

## 2016-07-30 ENCOUNTER — Emergency Department
Admission: EM | Admit: 2016-07-30 | Discharge: 2016-07-30 | Disposition: A | Discharge status: Home / Self Care | Payer: Managed Care, Other (non HMO) | Attending: Emergency Medicine | Admitting: Emergency Medicine

## 2016-07-30 DIAGNOSIS — Z7984 Long term (current) use of oral hypoglycemic drugs: Secondary | ICD-10-CM | POA: Insufficient documentation

## 2016-07-30 DIAGNOSIS — Y939 Activity, unspecified: Secondary | ICD-10-CM | POA: Diagnosis not present

## 2016-07-30 DIAGNOSIS — W260XXA Contact with knife, initial encounter: Secondary | ICD-10-CM | POA: Insufficient documentation

## 2016-07-30 DIAGNOSIS — Y999 Unspecified external cause status: Secondary | ICD-10-CM | POA: Insufficient documentation

## 2016-07-30 DIAGNOSIS — Z96642 Presence of left artificial hip joint: Secondary | ICD-10-CM | POA: Diagnosis not present

## 2016-07-30 DIAGNOSIS — E119 Type 2 diabetes mellitus without complications: Secondary | ICD-10-CM | POA: Diagnosis not present

## 2016-07-30 DIAGNOSIS — Z87891 Personal history of nicotine dependence: Secondary | ICD-10-CM | POA: Insufficient documentation

## 2016-07-30 DIAGNOSIS — Z7982 Long term (current) use of aspirin: Secondary | ICD-10-CM | POA: Insufficient documentation

## 2016-07-30 DIAGNOSIS — S61215A Laceration without foreign body of left ring finger without damage to nail, initial encounter: Secondary | ICD-10-CM | POA: Insufficient documentation

## 2016-07-30 DIAGNOSIS — I10 Essential (primary) hypertension: Secondary | ICD-10-CM | POA: Insufficient documentation

## 2016-07-30 DIAGNOSIS — Y929 Unspecified place or not applicable: Secondary | ICD-10-CM | POA: Diagnosis not present

## 2016-07-30 DIAGNOSIS — S6982XA Other specified injuries of left wrist, hand and finger(s), initial encounter: Secondary | ICD-10-CM | POA: Diagnosis present

## 2016-07-30 DIAGNOSIS — Z23 Encounter for immunization: Secondary | ICD-10-CM | POA: Diagnosis not present

## 2016-07-30 MED ORDER — TETANUS-DIPHTH-ACELL PERTUSSIS 5-2.5-18.5 LF-MCG/0.5 IM SUSP
0.5000 mL | Freq: Once | INTRAMUSCULAR | Status: AC
  Administered 2016-07-30: 0.5 mL via INTRAMUSCULAR
  Filled 2016-07-30: qty 0.5

## 2016-07-30 NOTE — ED Notes (Signed)
Pt report about 2030 she was closing a drawer knife stuck to 4th digit bruse noted no bleeding denies pain reports only throbbing. Pt not sure if Tdap is up to date.

## 2016-07-30 NOTE — ED Notes (Signed)
Pt discharged to home.  Family member driving.  Discharge instructions reviewed.  Verbalized understanding.  No questions or concerns at this time.  Teach back verified.  Pt in NAD.  No items left in ED.   

## 2016-07-30 NOTE — ED Triage Notes (Addendum)
Pt presents to ED with laceration to 4th digit on left hand. Pt states she cut it accidentally with a knife. Bleeding controlled. Small laceration noted to pad on finger tip.

## 2016-07-30 NOTE — ED Provider Notes (Signed)
[facility] Emergency Department Provider Note  ____________________________________________  Time seen: Approximately 10:31 PM  I have reviewed the triage vital signs and the nursing notes.   HISTORY  Chief Complaint Extremity Laceration    HPI Tracy Bryan is a 56 y.o. female who presents to emergency department for laceration to the fourth digit of the left hand. Patient reports that she was placing an item in her dishwasher when she accidentally cut it on a exposed knife blade. Patient cleaned the laceration at home and presents here with bleeding controlled. She denies any numbness or tingling of the digit. Full range of motion to the affected digit. Patient is unsure of her last tetanus shot. No other complaints or injuries.   Past Medical History:  Diagnosis Date  . Arthritis   . Diabetes mellitus without complication (HCC)    Type 2  . Disseminated intravascular coagulation (HCC) 1999   After child birth  . GERD (gastroesophageal reflux disease)   . Herpes simplex   . Hypercholesteremia   . Hypertension   . Migraines   . Pneumonia   . PONV (postoperative nausea and vomiting)     Patient Active Problem List   Diagnosis Date Noted  . Herpes simplex infection 11/23/2015  . Fibromyalgia 03/11/2015  . Perimenopause 02/17/2015  . Esophageal reflux 02/17/2015  . Overweight (BMI 25.0-29.9) 02/17/2015  . Vitamin D deficiency 02/17/2015  . DM type 2 with diabetic mixed hyperlipidemia (HCC) 07/28/2014  . Essential hypertension 07/28/2014  . DM (diabetes mellitus), type 2, uncontrolled, with renal complications (HCC) 05/18/2014    Past Surgical History:  Procedure Laterality Date  . ABDOMINAL HYSTERECTOMY    . CERVICAL DISC SURGERY    . DILATION AND CURETTAGE OF UTERUS  1996  . HERNIA REPAIR  2000   Umblical  . HIP ARTHROPLASTY Right   . TOTAL HIP ARTHROPLASTY Left 05/18/2014   Procedure: LEFT TOTAL HIP ARTHROPLASTY ANTERIOR APPROACH;   Surgeon: Marcene Corning, MD;  Location: [facility] OR;  Service: Orthopedics;  Laterality: Left;    Prior to Admission medications   Medication Sig Start Date End Date Taking? Authorizing Provider  aspirin 81 MG tablet Take 81 mg by mouth daily.    [provider]  atorvastatin (LIPITOR) 40 MG tablet Take 1 tablet (40 mg total) by mouth daily. 03/21/16   Reubin Milan, MD  chlorpheniramine-HYDROcodone [facility] PENNKINETIC ER) 10-8 MG/5ML SUER Take 5 mLs by mouth every 12 (twelve) hours as needed. 06/05/16   Payton Mccallum, MD  Cholecalciferol (VITAMIN D) 2000 units CAPS Take 1 capsule (2,000 Units total) by mouth daily. 02/17/15   Plonk, Chrissie Noa, MD  doxycycline (VIBRA-TABS) 100 MG tablet Take 1 tablet (100 mg total) by mouth 2 (two) times daily. 06/05/16   Payton Mccallum, MD  DULoxetine (CYMBALTA) 60 MG capsule TAKE 1 CAPSULE (60 MG TOTAL) BY MOUTH DAILY. 05/26/16   Reubin Milan, MD  glipiZIDE (GLUCOTROL) 5 MG tablet Take 1 tablet (5 mg total) by mouth 2 (two) times daily before a meal. 07/27/16   Reubin Milan, MD  glucose blood test strip 1 each by Other route daily. Use as instructed    [provider]  lansoprazole (PREVACID) 15 MG capsule Take 15 mg by mouth daily as needed.    [provider]  losartan (COZAAR) 100 MG tablet Take 1 tablet (100 mg total) by mouth daily. 03/19/16   Reubin Milan, MD  metFORMIN (GLUCOPHAGE) 1000 MG tablet TAKE 1 TABLET (1,000 MG  TOTAL) BY MOUTH 2 (TWO) TIMES DAILY WITH A MEAL. 04/18/16   Reubin Milan, MD  metFORMIN (GLUCOPHAGE) 1000 MG tablet TAKE 1 TABLET (1,000 MG TOTAL) BY MOUTH 2 (TWO) TIMES DAILY WITH A MEAL. 07/02/16   Reubin Milan, MD  metoprolol succinate (TOPROL-XL) 50 MG 24 hr tablet TAKE 1 TABLET (50 MG TOTAL) BY MOUTH DAILY. TAKE WITH OR IMMEDIATELY FOLLOWING A MEAL. 03/06/16   Reubin Milan, MD  Multiple Vitamins-Minerals (MULTIVITAMIN WITH MINERALS) tablet Take 1 tablet by mouth daily.    [provider]  sitaGLIPtin (JANUVIA) 100 MG tablet Take 1 tablet (100 mg total) by mouth daily. 04/12/16   Reubin Milan, MD  valACYclovir (VALTREX) 1000 MG tablet Take 1 tablet (1,000 mg total) by mouth 2 (two) times daily. 11/23/15   Reubin Milan, MD    Allergies Augmentin [amoxicillin-pot clavulanate]; Benazepril hcl; and Penicillins  Family History  Problem Relation Age of Onset  . Hypertension Mother   . Stroke Mother   . Hyperlipidemia Mother   . Heart disease Mother        heart attack  . Diabetes Father   . Cancer Father        lung  . Diabetes Sister   . Hyperlipidemia Sister   . Stroke Paternal Grandfather   . Hypertension Paternal Grandfather   . Stroke Maternal Grandfather   . Alzheimer's disease Paternal Grandmother   . Diabetes Sister   . Cancer Sister        breast  . Breast cancer Sister 66  . Breast cancer Paternal Aunt     Social History Social History  Substance Use Topics  . Smoking status: Former Smoker    Quit date: 06/14/1984  . Smokeless tobacco: Never Used  . Alcohol use No     Review of Systems  Constitutional: No fever/chills Cardiovascular: no chest pain. Respiratory: no cough. No SOB. Musculoskeletal: Negative for musculoskeletal pain. Skin: Laceration to the 4th digit Neurological: Negative for headaches, focal weakness or numbness. 10-point ROS otherwise negative.  ____________________________________________   PHYSICAL EXAM:  VITAL SIGNS: ED Triage Vitals  Enc Vitals Group     BP 07/30/16 2141 (!) 153/114     Pulse Rate 07/30/16 2141 88     Resp 07/30/16 2141 18     Temp 07/30/16 2141 98 F (36.7 C)     Temp Source 07/30/16 2141 Oral     SpO2 07/30/16 2141 98 %     Weight 07/30/16 2144 133 lb (60.3 kg)     Height 07/30/16 2144 5' (1.524 m)     Head Circumference --      Peak Flow --      Pain Score 07/30/16 2143 3     Pain Loc --      Pain Edu? --      Excl. in GC? --      Constitutional: Alert and oriented. Well  appearing and in no acute distress. Eyes: Conjunctivae are normal. PERRL. EOMI. Head: Atraumatic. Neck: No stridor.   Cardiovascular: Normal rate, regular rhythm. Normal S1 and S2.  Good peripheral circulation. Respiratory: Normal respiratory effort without tachypnea or retractions. Lungs CTAB. Good air entry to the bases with no decreased or absent breath sounds. Musculoskeletal: Full range of motion to all extremities. No gross deformities appreciated. Neurologic:  Normal speech and language. No gross focal neurologic deficits are appreciated.  Skin:  Skin is warm, dry and intact. No rash noted. 1 cm laceration noted  to the pad of the fourth digit of the left hand. Edges are well approximated. No foreign body. Area is mildly tender to palpation. No palpable abnormality. No nailbed involvement. No bleeding at this time. Psychiatric: Mood and affect are normal. Speech and behavior are normal. Patient exhibits appropriate insight and judgement.   ____________________________________________   LABS (all labs ordered are listed, but only abnormal results are displayed)  Labs Reviewed - No data to display ____________________________________________  EKG   ____________________________________________  RADIOLOGY   No results found.  ____________________________________________    PROCEDURES  Procedure(s) performed:    Marland Kitchen.Marland Kitchen.Laceration Repair Date/Time: 07/30/2016 11:44 PM Performed by: Gala RomneyUTHRIELL, JONATHAN D Authorized by: Gala RomneyUTHRIELL, JONATHAN D   Consent:    Consent obtained:  Verbal   Consent given by:  Patient   Risks discussed:  Pain Anesthesia (see MAR for exact dosages):    Anesthesia method:  None Laceration details:    Location:  Finger   Finger location:  L ring finger   Length (cm):  1 Repair type:    Repair type:  Simple Exploration:    Hemostasis achieved with:  Direct pressure   Wound exploration: wound explored through full range of motion and entire depth  of wound probed and visualized     Wound extent: no foreign bodies/material noted, no muscle damage noted, no nerve damage noted, no tendon damage noted and no underlying fracture noted     Contaminated: no   Treatment:    Area cleansed with:  Shur-Clens   Amount of cleaning:  Standard Skin repair:    Repair method:  Tissue adhesive and Steri-Strips   Number of Steri-Strips:  2 Approximation:    Approximation:  Close Post-procedure details:    Dressing:  Open (no dressing)   Patient tolerance of procedure:  Tolerated well, no immediate complications Comments:     Edges were closed with Dermabond. Area was covered with Steri-Strips. Patient tolerated well in no calm patient's      Medications  Tdap (BOOSTRIX) injection 0.5 mL (0.5 mLs Intramuscular Given 07/30/16 2247)     ____________________________________________   INITIAL IMPRESSION / ASSESSMENT AND PLAN / ED COURSE  Pertinent labs & imaging results that were available during my care of the patient were reviewed by me and considered in my medical decision making (see chart for details).  Review of the [place]RS was performed in accordance of the [facility] prior to dispensing any controlled drugs.     Patient's diagnosis is consistent with laceration to the fourth digit of the left hand. Edges were smooth, closed, no bleeding. Wound was closed as described above. No complications. Wound care structures are given to patient. Patient will follow up primary care as needed..  Patient is given ED precautions to return to the ED for any worsening or new symptoms.     ____________________________________________  FINAL CLINICAL IMPRESSION(S) / ED DIAGNOSES  Final diagnoses:  Laceration of left ring finger without foreign body without damage to nail, initial encounter      NEW MEDICATIONS STARTED DURING THIS VISIT:  Discharge Medication List as of 07/30/2016 10:56 PM          This chart was dictated using voice  recognition software/Dragon. Despite best efforts to proofread, errors can occur which can change the meaning. Any change was purely unintentional.    Racheal PatchesCuthriell, Jonathan D, PA-C 07/30/16 2345    Loleta RoseForbach, Cory, MD 07/31/16 [phone]

## 2016-08-01 ENCOUNTER — Encounter: Payer: Self-pay | Admitting: Internal Medicine

## 2016-08-01 ENCOUNTER — Ambulatory Visit (INDEPENDENT_AMBULATORY_CARE_PROVIDER_SITE_OTHER): Payer: Managed Care, Other (non HMO) | Admitting: Internal Medicine

## 2016-08-01 VITALS — BP 136/86 | HR 112 | Ht 60.0 in | Wt 136.0 lb

## 2016-08-01 DIAGNOSIS — E1169 Type 2 diabetes mellitus with other specified complication: Secondary | ICD-10-CM | POA: Diagnosis not present

## 2016-08-01 DIAGNOSIS — E1165 Type 2 diabetes mellitus with hyperglycemia: Secondary | ICD-10-CM

## 2016-08-01 DIAGNOSIS — E1122 Type 2 diabetes mellitus with diabetic chronic kidney disease: Secondary | ICD-10-CM

## 2016-08-01 DIAGNOSIS — I10 Essential (primary) hypertension: Secondary | ICD-10-CM

## 2016-08-01 DIAGNOSIS — IMO0002 Reserved for concepts with insufficient information to code with codable children: Secondary | ICD-10-CM

## 2016-08-01 DIAGNOSIS — N183 Chronic kidney disease, stage 3 (moderate): Secondary | ICD-10-CM

## 2016-08-01 DIAGNOSIS — E782 Mixed hyperlipidemia: Secondary | ICD-10-CM | POA: Diagnosis not present

## 2016-08-01 NOTE — Patient Instructions (Addendum)
For diabetes:  Continue current medications at the same doses                         Add Jardiance 25 mg once a day (call after several weeks if working for a prescription; also use savings card)  For cholesterol, wait for lab results but will likely stop Simvastatin and start Atorvastatin  Continue same BP meds.

## 2016-08-01 NOTE — Progress Notes (Signed)
Date:  08/01/2016   Name:  Tracy Bryan   DOB:  August 29, 1960   MRN:  [id number]   Chief Complaint: Diabetes (Metformin changed to 500 mg once in the morning and once in afternoon. Needs new Rx sent for 500 mg instead of 1000 so she does not have to cut in half. ); Hypertension; and Hyperlipidemia (Pharmacy was unsure which medication pt should be taking between atorvastatin and simvastatin. Pt has been still taking Simvastatin and did not realise change to atorvastatin. ) Diabetes  She presents for her follow-up diabetic visit. She has type 2 diabetes mellitus. Her disease course has been stable. Pertinent negatives for hypoglycemia include no headaches or tremors. Pertinent negatives for diabetes include no chest pain, no fatigue, no polydipsia and no polyuria. Symptoms are stable. Pertinent negatives for diabetic complications include no heart disease or nephropathy. Current diabetic treatment includes oral agent (triple therapy) Alma Friendly 100mg , metformin 500 mg bid and glipizide bid). She is compliant with treatment most of the time. Her weight is stable. She is following a generally healthy diet. She monitors blood glucose at home 1-2 x per day. Her home blood glucose trend is fluctuating minimally. An ACE inhibitor/angiotensin II receptor blocker is being taken.  Hyperlipidemia  This is a chronic problem. The problem is uncontrolled. Pertinent negatives include no chest pain or shortness of breath. Current antihyperlipidemic treatment includes statins (changed to lipitor 40 mg last visit but the pharmacy refilled simvastatin so she is taking that instead).  Hypertension  This is a chronic problem. The problem is controlled. Pertinent negatives include no chest pain, headaches, palpitations or shortness of breath.    Lab Results  Component Value Date   HGBA1C 7.8 (H) 03/19/2016   Lab Results  Component Value Date   CHOL 224 (H) 03/19/2016   HDL 47 03/19/2016   LDLCALC Comment  03/19/2016   TRIG 644 (HH) 03/19/2016   CHOLHDL 4.8 (H) 03/19/2016   Lab Results  Component Value Date   CREATININE 0.81 03/19/2016     Review of Systems  Constitutional: Negative for appetite change, fatigue, fever and unexpected weight change.  HENT: Negative for tinnitus and trouble swallowing.   Eyes: Negative for visual disturbance.  Respiratory: Negative for cough, chest tightness and shortness of breath.   Cardiovascular: Negative for chest pain, palpitations and leg swelling.  Gastrointestinal: Negative for abdominal pain.  Endocrine: Negative for polydipsia and polyuria.  Genitourinary: Negative for dysuria and hematuria.  Musculoskeletal: Negative for arthralgias.  Neurological: Negative for tremors, numbness and headaches.  Psychiatric/Behavioral: Negative for dysphoric mood.    Patient Active Problem List   Diagnosis Date Noted  . Herpes simplex infection 11/23/2015  . Fibromyalgia 03/11/2015  . Perimenopause 02/17/2015  . Esophageal reflux 02/17/2015  . Overweight (BMI 25.0-29.9) 02/17/2015  . Vitamin D deficiency 02/17/2015  . DM type 2 with diabetic mixed hyperlipidemia (HCC) 07/28/2014  . Essential hypertension 07/28/2014  . DM (diabetes mellitus), type 2, uncontrolled, with renal complications (HCC) 05/18/2014    Prior to Admission medications   Medication Sig Start Date End Date Taking? Authorizing Provider  aspirin 81 MG tablet Take 81 mg by mouth daily.    [provider]  atorvastatin (LIPITOR) 40 MG tablet Take 1 tablet (40 mg total) by mouth daily. 03/21/16   Reubin Milan, MD  chlorpheniramine-HYDROcodone [facility] PENNKINETIC ER) 10-8 MG/5ML SUER Take 5 mLs by mouth every 12 (twelve) hours as needed. 06/05/16   Payton Mccallum, MD  Cholecalciferol (VITAMIN D)  2000 units CAPS Take 1 capsule (2,000 Units total) by mouth daily. 02/17/15   Plonk, Chrissie NoaWilliam, MD  doxycycline (VIBRA-TABS) 100 MG tablet Take 1 tablet (100 mg total) by mouth 2 (two)  times daily. 06/05/16   Payton Mccallumonty, [place], MD  DULoxetine (CYMBALTA) 60 MG capsule TAKE 1 CAPSULE (60 MG TOTAL) BY MOUTH DAILY. 05/26/16   Reubin MilanBerglund, Michah Minton H, MD  glipiZIDE (GLUCOTROL) 5 MG tablet Take 1 tablet (5 mg total) by mouth 2 (two) times daily before a meal. 07/27/16   Reubin MilanBerglund, Anniebelle Devore H, MD  glucose blood test strip 1 each by Other route daily. Use as instructed    [provider]  lansoprazole (PREVACID) 15 MG capsule Take 15 mg by mouth daily as needed.    [provider]  losartan (COZAAR) 100 MG tablet Take 1 tablet (100 mg total) by mouth daily. 03/19/16   Reubin MilanBerglund, Jaedah Lords H, MD  metFORMIN (GLUCOPHAGE) 1000 MG tablet TAKE 1 TABLET (1,000 MG TOTAL) BY MOUTH 2 (TWO) TIMES DAILY WITH A MEAL. 04/18/16   Reubin MilanBerglund, Diem Pagnotta H, MD  metFORMIN (GLUCOPHAGE) 1000 MG tablet TAKE 1 TABLET (1,000 MG TOTAL) BY MOUTH 2 (TWO) TIMES DAILY WITH A MEAL. 07/02/16   Reubin MilanBerglund, Katya Rolston H, MD  metoprolol succinate (TOPROL-XL) 50 MG 24 hr tablet TAKE 1 TABLET (50 MG TOTAL) BY MOUTH DAILY. TAKE WITH OR IMMEDIATELY FOLLOWING A MEAL. 03/06/16   Reubin MilanBerglund, Cornella Emmer H, MD  Multiple Vitamins-Minerals (MULTIVITAMIN WITH MINERALS) tablet Take 1 tablet by mouth daily.    [provider]  sitaGLIPtin (JANUVIA) 100 MG tablet Take 1 tablet (100 mg total) by mouth daily. 04/12/16   Reubin MilanBerglund, Deandre Brannan H, MD  valACYclovir (VALTREX) 1000 MG tablet Take 1 tablet (1,000 mg total) by mouth 2 (two) times daily. 11/23/15   Reubin MilanBerglund, Twala Collings H, MD    Allergies  Allergen Reactions  . Augmentin [Amoxicillin-Pot Clavulanate] Itching  . Benazepril Hcl Cough  . Penicillins Other (See Comments)    Past Surgical History:  Procedure Laterality Date  . ABDOMINAL HYSTERECTOMY    . CERVICAL DISC SURGERY    . DILATION AND CURETTAGE OF UTERUS  1996  . HERNIA REPAIR  2000   Umblical  . HIP ARTHROPLASTY Right   . TOTAL HIP ARTHROPLASTY Left 05/18/2014   Procedure: LEFT TOTAL HIP ARTHROPLASTY ANTERIOR APPROACH;  Surgeon: Marcene CorningPeter Dalldorf, MD;   Location: [facility] OR;  Service: Orthopedics;  Laterality: Left;    Social History  Substance Use Topics  . Smoking status: Former Smoker    Quit date: 06/14/1984  . Smokeless tobacco: Never Used  . Alcohol use No     Medication list has been reviewed and updated.   Physical Exam  Constitutional: She is oriented to person, place, and time. She appears well-developed. No distress.  HENT:  Head: Normocephalic and atraumatic.  Cardiovascular: Normal rate, regular rhythm and normal heart sounds.   Pulmonary/Chest: Effort normal and breath sounds normal. No respiratory distress. She has no wheezes.  Musculoskeletal: Normal range of motion.  Neurological: She is alert and oriented to person, place, and time.  Skin: Skin is warm and dry. No rash noted.  Psychiatric: She has a normal mood and affect. Her behavior is normal. Thought content normal.  Nursing note and vitals reviewed.   BP 136/86   Pulse (!) 112   Ht 5' (1.524 m)   Wt 136 lb (61.7 kg)   LMP  (LMP Unknown)   SpO2 97%   BMI 26.56 kg/m   Assessment and Plan: 1.  Uncontrolled type 2 diabetes mellitus with stage 3 chronic kidney disease, without long-term current use of insulin (HCC) Continue current medications Add Jardiance 25 mg daily (samples) - Hemoglobin A1c - Comprehensive metabolic panel  2. Essential hypertension Fair control - consider changing to stronger ARB next visit  3. DM type 2 with diabetic mixed hyperlipidemia (HCC) Recheck lipids and if still very high, will advise to stop simvastatin and start atorvastatin. - Lipid panel   No orders of the defined types were placed in this encounter.   Bari Edward, MD [facility]  08/01/2016

## 2016-08-02 LAB — COMPREHENSIVE METABOLIC PANEL
A/G RATIO: 1.7 (ref 1.2–2.2)
ALK PHOS: 81 IU/L (ref 39–117)
ALT: 42 IU/L — AB (ref 0–32)
AST: 39 IU/L (ref 0–40)
Albumin: 4.4 g/dL (ref 3.5–5.5)
BUN/Creatinine Ratio: 17 (ref 9–23)
BUN: 15 mg/dL (ref 6–24)
Bilirubin Total: <0.2 mg/dL (ref 0.0–1.2)
CO2: 21 mmol/L (ref 20–29)
Calcium: 9.5 mg/dL (ref 8.7–10.2)
Chloride: 98 mmol/L (ref 96–106)
Creatinine, Ser: 0.88 mg/dL (ref 0.57–1.00)
GFR calc Af Amer: 84 mL/min/{1.73_m2} (ref >59)
GFR calc non Af Amer: 73 mL/min/{1.73_m2} (ref >59)
Globulin, Total: 2.6 g/dL (ref 1.5–4.5)
Glucose: 220 mg/dL — ABNORMAL HIGH (ref 65–99)
POTASSIUM: 4.3 mmol/L (ref 3.5–5.2)
Sodium: 138 mmol/L (ref 134–144)
Total Protein: 7 g/dL (ref 6.0–8.5)

## 2016-08-02 LAB — LIPID PANEL
CHOL/HDL RATIO: 5 ratio — AB (ref 0.0–4.4)
Cholesterol, Total: 211 mg/dL — ABNORMAL HIGH (ref 100–199)
HDL: 42 mg/dL (ref >39)
Triglycerides: 844 mg/dL (ref 0–149)

## 2016-08-02 LAB — HEMOGLOBIN A1C
Est. average glucose Bld gHb Est-mCnc: 177 mg/dL
Hgb A1c MFr Bld: 7.8 % — ABNORMAL HIGH (ref 4.8–5.6)

## 2016-08-06 ENCOUNTER — Telehealth: Payer: Self-pay

## 2016-08-06 ENCOUNTER — Other Ambulatory Visit: Payer: Self-pay

## 2016-08-06 MED ORDER — FLUCONAZOLE 150 MG PO TABS: 150.0000 mg | ORAL_TABLET | Freq: Once | ORAL | 0 refills | Status: AC

## 2016-08-06 NOTE — Telephone Encounter (Signed)
Pt called stating new medication for DM has given her a yeast infection. She stated Dr. Judithann GravesBerglund said to call if this happens and she will send in medication.  Sent in 1 tab Diflucan for relief of symptoms to [facility] in [place].

## 2016-08-08 ENCOUNTER — Ambulatory Visit (INDEPENDENT_AMBULATORY_CARE_PROVIDER_SITE_OTHER): Payer: Managed Care, Other (non HMO) | Admitting: Internal Medicine

## 2016-08-08 ENCOUNTER — Encounter: Payer: Self-pay | Admitting: Internal Medicine

## 2016-08-08 VITALS — BP 116/78 | HR 80 | Ht 60.0 in | Wt 133.0 lb

## 2016-08-08 DIAGNOSIS — N760 Acute vaginitis: Secondary | ICD-10-CM

## 2016-08-08 LAB — POCT WET PREP WITH KOH
KOH Prep POC: NEGATIVE
Trichomonas, UA: NEGATIVE

## 2016-08-08 MED ORDER — METRONIDAZOLE 500 MG PO TABS: 500.0000 mg | ORAL_TABLET | Freq: Two times a day (BID) | ORAL | 0 refills | Status: DC

## 2016-08-08 NOTE — Progress Notes (Signed)
Date:  08/08/2016   Name:  Tracy Bryan   DOB:  [birth year]   MRN:  [id number]   Chief Complaint: Vaginitis (Started monistat on Sunday and still using cream. Took diflucan Monday at noon. Still not any better. Labia is swollen and fire red. Was given samples of medication and then yeast infection started. Burning and itching. ) Vaginal Discharge  The patient's primary symptoms include a genital rash and vaginal discharge. The patient's pertinent negatives include no genital lesions. This is a new problem. The current episode started in the past 7 days. The problem occurs constantly. The problem has been gradually worsening. The pain is moderate. The problem affects both sides. Pertinent negatives include no chills, diarrhea, fever or urgency. The vaginal discharge was thick and yellow. There has been no bleeding.      Review of Systems  Constitutional: Negative for chills and fever.  Respiratory: Negative for chest tightness and shortness of breath.   Cardiovascular: Negative for chest pain.  Gastrointestinal: Negative for diarrhea.  Genitourinary: Positive for vaginal discharge. Negative for urgency.    Patient Active Problem List   Diagnosis Date Noted  . Herpes simplex infection 11/23/2015  . Fibromyalgia 03/11/2015  . Perimenopause 02/17/2015  . Esophageal reflux 02/17/2015  . Overweight (BMI 25.0-29.9) 02/17/2015  . Vitamin D deficiency 02/17/2015  . DM type 2 with diabetic mixed hyperlipidemia (HCC) 07/28/2014  . Essential hypertension 07/28/2014  . DM (diabetes mellitus), type 2, uncontrolled, with renal complications (HCC) 05/18/2014    Prior to Admission medications   Medication Sig Start Date End Date Taking? Authorizing Provider  aspirin 81 MG tablet Take 81 mg by mouth daily.   Yes [provider]  atorvastatin (LIPITOR) 40 MG tablet Take 1 tablet (40 mg total) by mouth daily. 03/21/16  Yes Reubin Milan, MD  Cholecalciferol (VITAMIN D) 2000 units  CAPS Take 1 capsule (2,000 Units total) by mouth daily. 02/17/15  Yes Plonk, Chrissie Noa, MD  DULoxetine (CYMBALTA) 60 MG capsule TAKE 1 CAPSULE (60 MG TOTAL) BY MOUTH DAILY. 05/26/16  Yes Reubin Milan, MD  glipiZIDE (GLUCOTROL) 5 MG tablet Take 1 tablet (5 mg total) by mouth 2 (two) times daily before a meal. 07/27/16  Yes Reubin Milan, MD  glucose blood test strip 1 each by Other route daily. Use as instructed   Yes [provider]  lansoprazole (PREVACID) 15 MG capsule Take 15 mg by mouth daily as needed.   Yes [provider]  losartan (COZAAR) 100 MG tablet Take 1 tablet (100 mg total) by mouth daily. 03/19/16  Yes Reubin Milan, MD  metFORMIN (GLUCOPHAGE) 1000 MG tablet TAKE 1 TABLET (1,000 MG TOTAL) BY MOUTH 2 (TWO) TIMES DAILY WITH A MEAL. Patient taking differently: Take 500 mg by mouth 2 (two) times daily.  07/02/16  Yes Reubin Milan, MD  metoprolol succinate (TOPROL-XL) 50 MG 24 hr tablet TAKE 1 TABLET (50 MG TOTAL) BY MOUTH DAILY. TAKE WITH OR IMMEDIATELY FOLLOWING A MEAL. 03/06/16  Yes Reubin Milan, MD  Multiple Vitamins-Minerals (MULTIVITAMIN WITH MINERALS) tablet Take 1 tablet by mouth daily.   Yes [provider]  sitaGLIPtin (JANUVIA) 100 MG tablet Take 1 tablet (100 mg total) by mouth daily. 04/12/16  Yes Reubin Milan, MD  valACYclovir (VALTREX) 1000 MG tablet Take 1 tablet (1,000 mg total) by mouth 2 (two) times daily. 11/23/15  Yes Reubin Milan, MD    Allergies  Allergen Reactions  . Augmentin [Amoxicillin-Pot  Clavulanate] Itching  . Benazepril Hcl Cough  . Penicillins Other (See Comments)    Past Surgical History:  Procedure Laterality Date  . ABDOMINAL HYSTERECTOMY    . CERVICAL DISC SURGERY    . DILATION AND CURETTAGE OF UTERUS  1996  . HERNIA REPAIR  2000   Umblical  . HIP ARTHROPLASTY Right   . TOTAL HIP ARTHROPLASTY Left 05/18/2014   Procedure: LEFT TOTAL HIP ARTHROPLASTY ANTERIOR APPROACH;  Surgeon: Marcene CorningPeter Dalldorf,  MD;  Location: [facility] OR;  Service: Orthopedics;  Laterality: Left;    Social History  Substance Use Topics  . Smoking status: Former Smoker    Quit date: 06/14/1984  . Smokeless tobacco: Never Used  . Alcohol use No     Medication list has been reviewed and updated.   Physical Exam  Constitutional: She is oriented to person, place, and time. She appears well-developed. No distress.  HENT:  Head: Normocephalic and atraumatic.  Pulmonary/Chest: Effort normal. No respiratory distress.  Genitourinary: There is tenderness on the right labia. There is tenderness on the left labia. There is erythema and tenderness in the vagina.  Genitourinary Comments: Rectal prolapse noted  Musculoskeletal: Normal range of motion.  Neurological: She is alert and oriented to person, place, and time.  Skin: Skin is warm and dry. No rash noted.  Psychiatric: She has a normal mood and affect. Her behavior is normal. Thought content normal.  Nursing note and vitals reviewed.  Microscopic wet-mount exam shows clue cells.  BP 116/78   Pulse 80   Ht 5' (1.524 m)   Wt 133 lb (60.3 kg)   LMP  (LMP Unknown)   SpO2 99%   BMI 25.97 kg/m   Assessment and Plan: 1. Acute vaginitis Use [place] to lubricate prolapse - POCT Wet Prep with KOH - metroNIDAZOLE (FLAGYL) 500 MG tablet; Take 1 tablet (500 mg total) by mouth 2 (two) times daily.  Dispense: 14 tablet; Refill: 0   Meds ordered this encounter  Medications  . metroNIDAZOLE (FLAGYL) 500 MG tablet    Sig: Take 1 tablet (500 mg total) by mouth 2 (two) times daily.    Dispense:  14 tablet    Refill:  0    Bari EdwardLaura Jadence Kinlaw, MD [facility]  08/08/2016

## 2016-08-08 NOTE — Patient Instructions (Signed)
Bacterial Vaginosis Bacterial vaginosis is a vaginal infection that occurs when the normal balance of bacteria in the vagina is disrupted. It results from an overgrowth of certain bacteria. This is the most common vaginal infection among women ages 15-44. Because bacterial vaginosis increases your risk for STIs (sexually transmitted infections), getting treated can help reduce your risk for chlamydia, gonorrhea, herpes, and HIV (human immunodeficiency virus). Treatment is also important for preventing complications in pregnant women, because this condition can cause an early (premature) delivery. What are the causes? This condition is caused by an increase in harmful bacteria that are normally present in small amounts in the vagina. However, the reason that the condition develops is not fully understood. What increases the risk? The following factors may make you more likely to develop this condition:  Having a new sexual partner or multiple sexual partners.  Having unprotected sex.  Douching.  Having an intrauterine device (IUD).  Smoking.  Drug and alcohol abuse.  Taking certain antibiotic medicines.  Being pregnant.  You cannot get bacterial vaginosis from toilet seats, bedding, swimming pools, or contact with objects around you. What are the signs or symptoms? Symptoms of this condition include:  Grey or white vaginal discharge. The discharge can also be watery or foamy.  A fish-like odor with discharge, especially after sexual intercourse or during menstruation.  Itching in and around the vagina.  Burning or pain with urination.  Some women with bacterial vaginosis have no signs or symptoms. How is this diagnosed? This condition is diagnosed based on:  Your medical history.  A physical exam of the vagina.  Testing a sample of vaginal fluid under a microscope to look for a large amount of bad bacteria or abnormal cells. Your health care provider may use a cotton swab  or a small wooden spatula to collect the sample.  How is this treated? This condition is treated with antibiotics. These may be given as a pill, a vaginal cream, or a medicine that is put into the vagina (suppository). If the condition comes back after treatment, a second round of antibiotics may be needed. Follow these instructions at home: Medicines  Take over-the-counter and prescription medicines only as told by your health care provider.  Take or use your antibiotic as told by your health care provider. Do not stop taking or using the antibiotic even if you start to feel better. General instructions  If you have a female sexual partner, tell her that you have a vaginal infection. She should see her health care provider and be treated if she has symptoms. If you have a female sexual partner, he does not need treatment.  During treatment: ? Avoid sexual activity until you finish treatment. ? Do not douche. ? Avoid alcohol as directed by your health care provider. ? Avoid breastfeeding as directed by your health care provider.  Drink enough water and fluids to keep your urine clear or pale yellow.  Keep the area around your vagina and rectum clean. ? Wash the area daily with warm water. ? Wipe yourself from front to back after using the toilet.  Keep all follow-up visits as told by your health care provider. This is important. How is this prevented?  Do not douche.  Wash the outside of your vagina with warm water only.  Use protection when having sex. This includes latex condoms and dental dams.  Limit how many sexual partners you have. To help prevent bacterial vaginosis, it is best to have sex with just   one partner (monogamous).  Make sure you and your sexual partner are tested for STIs.  Wear cotton or cotton-lined underwear.  Avoid wearing tight pants and pantyhose, especially during summer.  Limit the amount of alcohol that you drink.  Do not use any products that  contain nicotine or tobacco, such as cigarettes and e-cigarettes. If you need help quitting, ask your health care provider.  Do not use illegal drugs. Where to find more information:  Centers for Disease Control and Prevention: www.cdc.gov/std  American Sexual Health Association (ASHA): www.ashastd.org  U.S. Department of Health and Human Services, Office on Women's Health: www.womenshealth.gov/ or https://www.womenshealth.gov/a-z-topics/bacterial-vaginosis Contact a health care provider if:  Your symptoms do not improve, even after treatment.  You have more discharge or pain when urinating.  You have a fever.  You have pain in your abdomen.  You have pain during sex.  You have vaginal bleeding between periods. Summary  Bacterial vaginosis is a vaginal infection that occurs when the normal balance of bacteria in the vagina is disrupted.  Because bacterial vaginosis increases your risk for STIs (sexually transmitted infections), getting treated can help reduce your risk for chlamydia, gonorrhea, herpes, and HIV (human immunodeficiency virus). Treatment is also important for preventing complications in pregnant women, because the condition can cause an early (premature) delivery.  This condition is treated with antibiotic medicines. These may be given as a pill, a vaginal cream, or a medicine that is put into the vagina (suppository). This information is not intended to replace advice given to you by your health care provider. Make sure you discuss any questions you have with your health care provider. Document Released: 01/29/2005 Document Revised: 10/15/2015 Document Reviewed: 10/15/2015 Elsevier Interactive Patient Education  2017 [employer] Inc.  

## 2016-08-23 ENCOUNTER — Telehealth: Payer: Self-pay

## 2016-08-23 ENCOUNTER — Other Ambulatory Visit: Payer: Self-pay | Admitting: Internal Medicine

## 2016-08-23 MED ORDER — EMPAGLIFLOZIN 25 MG PO TABS: 25.0000 mg | ORAL_TABLET | Freq: Every day | ORAL | 5 refills | Status: DC

## 2016-08-23 NOTE — Telephone Encounter (Signed)
Notified by Asher Muirjamie

## 2016-08-23 NOTE — Telephone Encounter (Signed)
Rx sent.  Remind her to use the savings card!

## 2016-08-23 NOTE — Telephone Encounter (Signed)
Pt calling stated she was given samples for Jardiance and is almost out- Medications is working and [place] [facility] [place]

## 2016-08-29 ENCOUNTER — Telehealth: Payer: Self-pay

## 2016-08-29 NOTE — Telephone Encounter (Signed)
The last visit documented that the problem was not yeast but bacteria which is not linked to Jardiance.  If she does not want to take Jardiance, then our next medication with be an injectable.  Please schedule and appointment to discuss, get training on use and a sample.

## 2016-08-29 NOTE — Telephone Encounter (Signed)
Pt wants think about beginning injectable medication., If she decided on injectable then she will call front desk to schedule appt.

## 2016-08-29 NOTE — Telephone Encounter (Signed)
Patient called and left VM- stating she was put on Jardiance "a while back" and has had continuous yeast infections since taking this medication. She has had to buy several OTC Monistat kits. Wants to know if she can be put on any other medication besides Jardiance due to these Yeast infections. Please advise.

## 2016-09-18 ENCOUNTER — Ambulatory Visit (INDEPENDENT_AMBULATORY_CARE_PROVIDER_SITE_OTHER): Payer: Managed Care, Other (non HMO) | Admitting: Internal Medicine

## 2016-09-18 ENCOUNTER — Encounter: Payer: Self-pay | Admitting: Internal Medicine

## 2016-09-18 ENCOUNTER — Other Ambulatory Visit: Payer: Self-pay | Admitting: Internal Medicine

## 2016-09-18 VITALS — BP 136/86 | HR 99 | Ht 60.0 in | Wt 138.0 lb

## 2016-09-18 DIAGNOSIS — E1165 Type 2 diabetes mellitus with hyperglycemia: Secondary | ICD-10-CM | POA: Diagnosis not present

## 2016-09-18 DIAGNOSIS — E1122 Type 2 diabetes mellitus with diabetic chronic kidney disease: Secondary | ICD-10-CM

## 2016-09-18 DIAGNOSIS — I10 Essential (primary) hypertension: Secondary | ICD-10-CM | POA: Diagnosis not present

## 2016-09-18 DIAGNOSIS — N183 Chronic kidney disease, stage 3 (moderate): Secondary | ICD-10-CM

## 2016-09-18 DIAGNOSIS — IMO0002 Reserved for concepts with insufficient information to code with codable children: Secondary | ICD-10-CM

## 2016-09-18 NOTE — Patient Instructions (Addendum)
Trulicity - medication to check on coverage for. (alternatives are Victoza, Ozempic,Bydureon)  Exercise 30 minutes 4-5 times per week. Work on low [place] diet.  Keep track of Blood pressure and record it with your blood sugar daily. Bring your cuff with you to the next visit.

## 2016-09-18 NOTE — Progress Notes (Signed)
Date:  09/18/2016   Name:  Tracy Bryan   DOB:  05/21/1960   MRN:  [id number]   Chief Complaint: Diabetes (Was told it Jardiance cause yeast infection. At first thought was getting cont. yeast infections. Foudn out it was bacterial. That went away. Then starting getting yeast and used monistat and it helped but kept coming back. )  Diabetes  She presents for her follow-up diabetic visit. She has type 2 diabetes mellitus. Her disease course has been worsening. Pertinent negatives for diabetes include no chest pain and no fatigue. Current diabetic treatment includes oral agent (triple therapy) (metformin, glipizide, januvia.  Jardiance added but had recurrent yeast infections.). She is compliant with treatment most of the time. She rarely participates in exercise. She monitors blood glucose at home 1-2 x per day. Home blood sugar record trend: recently decreasing BS because she is improving her diet. An ACE inhibitor/angiotensin II receptor blocker is being taken. Eye exam is current.  Hypertension  This is a chronic problem. The problem has been waxing and waning (often very high in the evening) since onset. Pertinent negatives include no chest pain or shortness of breath. Past treatments include angiotensin blockers and beta blockers.   Lab Results  Component Value Date   HGBA1C 7.8 (H) 08/01/2016     Review of Systems  Constitutional: Negative for chills, fatigue and unexpected weight change.  Respiratory: Negative for chest tightness and shortness of breath.   Cardiovascular: Negative for chest pain.    Patient Active Problem List   Diagnosis Date Noted  . Herpes simplex infection 11/23/2015  . Fibromyalgia 03/11/2015  . Perimenopause 02/17/2015  . Esophageal reflux 02/17/2015  . Overweight (BMI 25.0-29.9) 02/17/2015  . Vitamin D deficiency 02/17/2015  . DM type 2 with diabetic mixed hyperlipidemia (HCC) 07/28/2014  . Essential hypertension 07/28/2014  . DM (diabetes  mellitus), type 2, uncontrolled, with renal complications (HCC) 05/18/2014    Prior to Admission medications   Medication Sig Start Date End Date Taking? Authorizing Provider  aspirin 81 MG tablet Take 81 mg by mouth daily.   Yes [provider]  atorvastatin (LIPITOR) 40 MG tablet Take 1 tablet (40 mg total) by mouth daily. 03/21/16  Yes Reubin Milan, MD  Cholecalciferol (VITAMIN D) 2000 units CAPS Take 1 capsule (2,000 Units total) by mouth daily. 02/17/15  Yes Plonk, Chrissie Noa, MD  DULoxetine (CYMBALTA) 60 MG capsule TAKE 1 CAPSULE (60 MG TOTAL) BY MOUTH DAILY. 05/26/16  Yes Reubin Milan, MD  glipiZIDE (GLUCOTROL) 5 MG tablet Take 1 tablet (5 mg total) by mouth 2 (two) times daily before a meal. 07/27/16  Yes Reubin Milan, MD  glucose blood test strip 1 each by Other route daily. Use as instructed   Yes [provider]  lansoprazole (PREVACID) 15 MG capsule Take 15 mg by mouth daily as needed.   Yes [provider]  losartan (COZAAR) 100 MG tablet Take 1 tablet (100 mg total) by mouth daily. 03/19/16  Yes Reubin Milan, MD  metFORMIN (GLUCOPHAGE) 1000 MG tablet TAKE 1 TABLET (1,000 MG TOTAL) BY MOUTH 2 (TWO) TIMES DAILY WITH A MEAL. Patient taking differently: Take 500 mg by mouth 2 (two) times daily.  07/02/16  Yes Reubin Milan, MD  metoprolol succinate (TOPROL-XL) 50 MG 24 hr tablet TAKE 1 TABLET (50 MG TOTAL) BY MOUTH DAILY. TAKE WITH OR IMMEDIATELY FOLLOWING A MEAL. 03/06/16  Yes Reubin Milan, MD  Multiple Vitamins-Minerals (MULTIVITAMIN WITH MINERALS)  tablet Take 1 tablet by mouth daily.   Yes [provider]  sitaGLIPtin (JANUVIA) 100 MG tablet Take 1 tablet (100 mg total) by mouth daily. 04/12/16  Yes Reubin MilanBerglund, Natia Fahmy H, MD  valACYclovir (VALTREX) 1000 MG tablet Take 1 tablet (1,000 mg total) by mouth 2 (two) times daily. 11/23/15  Yes Reubin MilanBerglund, Jabree Rebert H, MD    Allergies  Allergen Reactions  . Augmentin [Amoxicillin-Pot Clavulanate]  Itching  . Benazepril Hcl Cough  . Penicillins Other (See Comments)    Past Surgical History:  Procedure Laterality Date  . ABDOMINAL HYSTERECTOMY    . CERVICAL DISC SURGERY    . DILATION AND CURETTAGE OF UTERUS  1996  . HERNIA REPAIR  2000   Umblical  . HIP ARTHROPLASTY Right   . TOTAL HIP ARTHROPLASTY Left 05/18/2014   Procedure: LEFT TOTAL HIP ARTHROPLASTY ANTERIOR APPROACH;  Surgeon: Marcene CorningPeter Dalldorf, MD;  Location: [facility] OR;  Service: Orthopedics;  Laterality: Left;    Social History  Substance Use Topics  . Smoking status: Former Smoker    Quit date: 06/14/1984  . Smokeless tobacco: Never Used  . Alcohol use No     Medication list has been reviewed and updated.   Physical Exam  Constitutional: She is oriented to person, place, and time. She appears well-developed. No distress.  HENT:  Head: Normocephalic and atraumatic.  Neck: Normal range of motion. Neck supple. No thyromegaly present.  Cardiovascular: Normal rate, regular rhythm and normal heart sounds.   Pulmonary/Chest: Effort normal and breath sounds normal. No respiratory distress. She has no wheezes.  Musculoskeletal: Normal range of motion. She exhibits no edema.  Neurological: She is alert and oriented to person, place, and time.  Skin: Skin is warm and dry. No rash noted.  Psychiatric: She has a normal mood and affect. Her behavior is normal. Thought content normal.  Nursing note and vitals reviewed.   BP 136/86   Pulse 99   Ht 5' (1.524 m)   Wt 138 lb (62.6 kg)   LMP  (LMP Unknown)   SpO2 98%   BMI 26.95 kg/m   Assessment and Plan: 1. Essential hypertension Monitor and record for review at next visit Continue same medication for now  2. Uncontrolled type 2 diabetes mellitus with stage 3 chronic kidney disease, without long-term current use of insulin (HCC) Continue triple therapy Begin regular exercise and work harder on low carb diet Consider adding GLP-1 such as trulicity next visit if  needed   No orders of the defined types were placed in this encounter.   Bari EdwardLaura Maryjane Benedict, MD [facility]  09/18/2016

## 2016-09-20 ENCOUNTER — Other Ambulatory Visit: Payer: Self-pay | Admitting: Internal Medicine

## 2016-09-20 DIAGNOSIS — IMO0002 Reserved for concepts with insufficient information to code with codable children: Secondary | ICD-10-CM

## 2016-09-20 DIAGNOSIS — E1122 Type 2 diabetes mellitus with diabetic chronic kidney disease: Secondary | ICD-10-CM

## 2016-09-20 DIAGNOSIS — E1165 Type 2 diabetes mellitus with hyperglycemia: Principal | ICD-10-CM

## 2016-09-20 DIAGNOSIS — N183 Chronic kidney disease, stage 3 (moderate): Principal | ICD-10-CM

## 2016-10-03 ENCOUNTER — Ambulatory Visit (INDEPENDENT_AMBULATORY_CARE_PROVIDER_SITE_OTHER): Payer: 59

## 2016-10-03 ENCOUNTER — Ambulatory Visit
Admission: EM | Admit: 2016-10-03 | Discharge: 2016-10-03 | Disposition: A | Discharge status: Home / Self Care | Payer: 59 | Attending: Emergency Medicine | Admitting: Emergency Medicine

## 2016-10-03 ENCOUNTER — Encounter: Payer: Self-pay | Admitting: Emergency Medicine

## 2016-10-03 DIAGNOSIS — G5602 Carpal tunnel syndrome, left upper limb: Secondary | ICD-10-CM | POA: Diagnosis not present

## 2016-10-03 DIAGNOSIS — M62838 Other muscle spasm: Secondary | ICD-10-CM

## 2016-10-03 MED ORDER — IBUPROFEN 600 MG PO TABS: 600.0000 mg | ORAL_TABLET | Freq: Four times a day (QID) | ORAL | 0 refills | Status: DC | PRN

## 2016-10-03 MED ORDER — METHOCARBAMOL 750 MG PO TABS: 750.0000 mg | ORAL_TABLET | ORAL | 0 refills | Status: DC

## 2016-10-03 MED ORDER — TRAMADOL HCL 50 MG PO TABS: 50.0000 mg | ORAL_TABLET | Freq: Four times a day (QID) | ORAL | 0 refills | Status: DC | PRN

## 2016-10-03 NOTE — ED Provider Notes (Signed)
HPI  SUBJECTIVE:  Tracy Bryan is a right handed a82 y.o. female who presents with 1 week of left hand numbness described as pins and needles, intermittent, lasting minutes, states is worse at night, states that she is waking up with her hand painful and numb. She reports some grip weakness. Denies shoulder or arm weakness. No elbow weakness or pain. She also reports throbbing, constant pain in the left shoulder and neck during this time as well. She states this feels similar to a pinched disc she had on the right side. She has had no change in physical activity, lifting heavy objects, trauma, fevers. No limitation of motion of her hand, unintentional weight loss. There are no alleviating factors. She hasn't tried anything for this. She states that her Pain is worse at night and that her shoulder or neck pain is worse with rotating her neck to the left, abduction of her shoulder, neck flexion. Past medical history of a pinched disc in her neck on the right side for which she had C4-C6 fusion, diabetes, hypertension, carpal tunnel syndrome on the right side, DIC. [id number], Nyoka Cowden, MD     Past Medical History:  Diagnosis Date  . Arthritis   . Diabetes mellitus without complication (HCC)    Type 2  . Disseminated intravascular coagulation (HCC) 1999   After child birth  . GERD (gastroesophageal reflux disease)   . Herpes simplex   . Hypercholesteremia   . Hypertension   . Migraines   . Pneumonia   . PONV (postoperative nausea and vomiting)     Past Surgical History:  Procedure Laterality Date  . ABDOMINAL HYSTERECTOMY    . CERVICAL DISC SURGERY    . DILATION AND CURETTAGE OF UTERUS  1996  . HERNIA REPAIR  2000   Umblical  . HIP ARTHROPLASTY Right   . TOTAL HIP ARTHROPLASTY Left 05/18/2014   Procedure: LEFT TOTAL HIP ARTHROPLASTY ANTERIOR APPROACH;  Surgeon: Marcene Corning, MD;  Location: [facility] OR;  Service: Orthopedics;  Laterality: Left;    Family History  Problem  Relation Age of Onset  . Hypertension Mother   . Stroke Mother   . Hyperlipidemia Mother   . Heart disease Mother        heart attack  . Diabetes Father   . Cancer Father        lung  . Diabetes Sister   . Hyperlipidemia Sister   . Stroke Paternal Grandfather   . Hypertension Paternal Grandfather   . Stroke Maternal Grandfather   . Alzheimer's disease Paternal Grandmother   . Diabetes Sister   . Cancer Sister        breast  . Breast cancer Sister 28  . Breast cancer Paternal Aunt     Social History  Substance Use Topics  . Smoking status: Former Smoker    Quit date: 06/14/1984  . Smokeless tobacco: Never Used  . Alcohol use No    No current facility-administered medications for this encounter.   Current Outpatient Prescriptions:  .  aspirin 81 MG tablet, Take 81 mg by mouth daily., Disp: , Rfl:  .  atorvastatin (LIPITOR) 40 MG tablet, Take 1 tablet (40 mg total) by mouth daily., Disp: 90 tablet, Rfl: 1 .  Cholecalciferol (VITAMIN D) 2000 units CAPS, Take 1 capsule (2,000 Units total) by mouth daily., Disp: 30 capsule, Rfl:  .  DULoxetine (CYMBALTA) 60 MG capsule, TAKE 1 CAPSULE (60 MG TOTAL) BY MOUTH DAILY., Disp: 90 capsule, Rfl: 3 .  glipiZIDE (  GLUCOTROL) 5 MG tablet, Take 1 tablet (5 mg total) by mouth 2 (two) times daily before a meal., Disp: 180 tablet, Rfl: 0 .  glucose blood test strip, 1 each by Other route daily. Use as instructed, Disp: , Rfl:  .  ibuprofen (ADVIL,MOTRIN) 600 MG tablet, Take 1 tablet (600 mg total) by mouth every 6 (six) hours as needed., Disp: 20 tablet, Rfl: 0 .  JANUVIA 100 MG tablet, TAKE 1 TABLET BY MOUTH DAILY, Disp: 30 tablet, Rfl: 4 .  lansoprazole (PREVACID) 15 MG capsule, Take 15 mg by mouth daily as needed., Disp: , Rfl:  .  losartan (COZAAR) 100 MG tablet, Take 1 tablet (100 mg total) by mouth daily., Disp: 90 tablet, Rfl: 3 .  metFORMIN (GLUCOPHAGE) 1000 MG tablet, TAKE 1 TABLET (1,000 MG TOTAL) BY MOUTH 2 (TWO) TIMES DAILY WITH A MEAL.  (Patient taking differently: Take 500 mg by mouth 2 (two) times daily. ), Disp: 180 tablet, Rfl: 0 .  methocarbamol (ROBAXIN) 750 MG tablet, Take 1 tablet (750 mg total) by mouth every 4 (four) hours., Disp: 40 tablet, Rfl: 0 .  metoprolol succinate (TOPROL-XL) 50 MG 24 hr tablet, TAKE 1 TABLET (50 MG TOTAL) BY MOUTH DAILY. TAKE WITH OR IMMEDIATELY FOLLOWING A MEAL., Disp: 90 tablet, Rfl: 2 .  Multiple Vitamins-Minerals (MULTIVITAMIN WITH MINERALS) tablet, Take 1 tablet by mouth daily., Disp: , Rfl:  .  traMADol (ULTRAM) 50 MG tablet, Take 1 tablet (50 mg total) by mouth every 6 (six) hours as needed., Disp: 20 tablet, Rfl: 0 .  valACYclovir (VALTREX) 1000 MG tablet, Take 1 tablet (1,000 mg total) by mouth 2 (two) times daily., Disp: 20 tablet, Rfl: 0  Allergies  Allergen Reactions  . Augmentin [Amoxicillin-Pot Clavulanate] Itching  . Benazepril Hcl Cough  . Penicillins Other (See Comments)  . Jardiance [Empagliflozin] Rash    Recurrent yeast vaginitis     ROS  As noted in HPI.   Physical Exam  BP (!) 168/105 (BP Location: Left Arm)   Pulse (!) 101   Temp 98.6 F (37 C) (Oral)   Resp 16   Ht 5' (1.524 m)   Wt 132 lb (59.9 kg)   LMP  (LMP Unknown)   SpO2 95%   BMI 25.78 kg/m    Constitutional: Well developed, well nourished,appears to be in mild painful distress Eyes: PERRL, EOMI, conjunctiva normal bilaterally HENT: Normocephalic, atraumatic,mucus membranes moist Respiratory: Normal inspiratory effort Cardiovascular: Normal rate  GI: nondistended skin: No rash, skin intact Musculoskeletal: Positive left trapezial tenderness, muscle spasm. Pain aggravated with turning head to the left, lateral bending to the right, neck flexion. No C-spine, T-spine, L-spine tenderness. elbow range of motion and strength within normal limits. Grip strength equal.  RP 2+. Positive Tinel, positive Phalen. Motor and sensation otherwise intact in the median/radial/ulnar distribution. No pain with  wrist range of motion. Neurologic: Alert & oriented x 3, CN II-XII grossly intact, no motor deficits, sensation grossly intact Psychiatric: Speech and behavior appropriate   ED Course   Medications - No data to display  Orders Placed This Encounter  Procedures  . DG Cervical Spine Complete    Standing Status:   Standing    Number of Occurrences:   1    Order Specific Question:   Reason for Exam (SYMPTOM  OR DIAGNOSIS REQUIRED)    Answer:   L sided neck pain, hand numbness r/o fx dislocation DJD  . Apply other splint    Standing Status:  Standing    Number of Occurrences:   1    Order Specific Question:   Specify device    Answer:   cockup splint to wear at night    Order Specific Question:   Laterality    Answer:   Left   No results found for this or any previous visit (from the past 24 hour(s)). Dg Cervical Spine Complete  Result Date: 10/03/2016 CLINICAL DATA:  Left shoulder and neck pain that started 1 week ago. EXAM: CERVICAL SPINE - COMPLETE 4+ VIEW COMPARISON:  Cervical spine MRI 06/23/2010 FINDINGS: C4-5 and C5-6 ACDF with solid arthrodesis. Hardware is in place. There is mild to moderate disc narrowing and endplate spurring at C3-4 and C6-7, which was also seen on 2012 MRI. No bony foraminal narrowing on oblique radiographs. No fracture deformity, endplate erosion, or evidence of bone lesion. IMPRESSION: 1. No acute finding. 2. C4-C6 ACDF with solid arthrodesis. 3. Adjacent segment disc degeneration at C3-4 and C6-7, degree of disc narrowing is similar to 2012 MRI. 4. No bony foraminal narrowing in this patient with left arm symptoms. Electronically Signed   By: Marnee Spring M.D.   On: 10/03/2016 15:22    ED Clinical Impression  Trapezius muscle spasm  Carpal tunnel syndrome of left wrist   ED Assessment/Plan  Pt does not want to address SOB at this visit. Had extensive discussion about sx/sn that should prompt ED evaluation- she agrees with this  Reviewed  imaging independently. No bony deformity in all narrowing. No acute findings. C4-C6 solid arthrodesis. Distal degeneration at C3-4, C6-7. See radiology report for details  Presentation c/w left-sided trapezial muscles spasm and carpal tunnel syndrome. Plan to send home with ibuprofen 600 mg and 1 g of Tylenol, muscle relaxant, tramadol, provided cock-up splint for the patient to wear at night. She declined prednisone taper after discussing the risks and benefits of having one. We'll have her follow-up with her primary care physician as needed or with her neurosurgeon, Dr. Newell Coral if the symptoms are not resolving. Also providing referral for Dr.[place], [place] on call.  Discussed imaging, MDM, plan and followup with patient. Discussed sn/sx that should prompt return to the ED. Patient agrees with plan.   Meds ordered this encounter  Medications  . ibuprofen (ADVIL,MOTRIN) 600 MG tablet    Sig: Take 1 tablet (600 mg total) by mouth every 6 (six) hours as needed.    Dispense:  20 tablet    Refill:  0  . methocarbamol (ROBAXIN) 750 MG tablet    Sig: Take 1 tablet (750 mg total) by mouth every 4 (four) hours.    Dispense:  40 tablet    Refill:  0  . traMADol (ULTRAM) 50 MG tablet    Sig: Take 1 tablet (50 mg total) by mouth every 6 (six) hours as needed.    Dispense:  20 tablet    Refill:  0    *This clinic note was created using Scientist, clinical (histocompatibility and immunogenetics). Therefore, there may be occasional mistakes despite careful proofreading.  ?   Domenick Gong, MD 10/03/16 2106

## 2016-10-03 NOTE — Discharge Instructions (Signed)
600 mg ibuprofen with 1 g of Tylenol 3-4 times a day. Use Robaxin and also for muscle spasms. Wear the splint, particularly at night. Follow-up with Dr. Marcell Barlow or Newell Coral if your symptoms are not getting better after a week. Go immediately to the ER for chest pain or pressure, worsening shortness of breath, fevers, wheezing, sweatiness with it shortness of breath, difficulty walking or getting around, or for any other signs and symptoms we discussed.

## 2016-10-03 NOTE — ED Triage Notes (Signed)
Patient c/o left shoulder and left sided neck pain that started a week ago.  Patient also reports numbness off and on in her left arm.  Patient denies injury or fall. Patient reports some SOB.  Patient denies chest pain.

## 2016-10-24 ENCOUNTER — Other Ambulatory Visit: Payer: Self-pay | Admitting: Internal Medicine

## 2016-11-07 ENCOUNTER — Ambulatory Visit (INDEPENDENT_AMBULATORY_CARE_PROVIDER_SITE_OTHER): Payer: 59 | Admitting: Internal Medicine

## 2016-11-07 ENCOUNTER — Encounter: Payer: Self-pay | Admitting: Internal Medicine

## 2016-11-07 VITALS — BP 142/86 | HR 100 | Ht 60.0 in | Wt 136.0 lb

## 2016-11-07 DIAGNOSIS — I10 Essential (primary) hypertension: Secondary | ICD-10-CM | POA: Diagnosis not present

## 2016-11-07 DIAGNOSIS — E1122 Type 2 diabetes mellitus with diabetic chronic kidney disease: Secondary | ICD-10-CM

## 2016-11-07 DIAGNOSIS — N183 Chronic kidney disease, stage 3 (moderate): Secondary | ICD-10-CM | POA: Diagnosis not present

## 2016-11-07 DIAGNOSIS — G5603 Carpal tunnel syndrome, bilateral upper limbs: Secondary | ICD-10-CM | POA: Diagnosis not present

## 2016-11-07 DIAGNOSIS — E1165 Type 2 diabetes mellitus with hyperglycemia: Secondary | ICD-10-CM

## 2016-11-07 DIAGNOSIS — E1169 Type 2 diabetes mellitus with other specified complication: Secondary | ICD-10-CM

## 2016-11-07 DIAGNOSIS — E782 Mixed hyperlipidemia: Secondary | ICD-10-CM | POA: Diagnosis not present

## 2016-11-07 DIAGNOSIS — IMO0002 Reserved for concepts with insufficient information to code with codable children: Secondary | ICD-10-CM

## 2016-11-07 NOTE — Patient Instructions (Addendum)
Put Metoprolol Succinate aside and take Bystolic 10 mg once a day  - monitor BP and look for 10-15 point improvement.

## 2016-11-07 NOTE — Progress Notes (Signed)
Date:  11/07/2016   Name:  Tracy Bryan   DOB:  Oct 14, 1960   MRN:  [id number]   Chief Complaint: Hypertension and Diabetes (BS- 09/24 was 216. )  Hypertension  This is a chronic problem. The problem is uncontrolled. Pertinent negatives include no chest pain, headaches, palpitations or shortness of breath. Past treatments include beta blockers and angiotensin blockers. The current treatment provides moderate improvement.  Diabetes  She presents for her follow-up diabetic visit. She has type 2 diabetes mellitus. Her disease course has been stable. Pertinent negatives for hypoglycemia include no headaches or tremors. Pertinent negatives for diabetes include no chest pain, no fatigue, no polydipsia and no polyuria. Current diabetic treatment includes oral agent (triple therapy). She is compliant with treatment all of the time. Her weight is stable. Her breakfast blood glucose is taken between 6-7 am. Her breakfast blood glucose range is generally 180-200 mg/dl. Her dinner blood glucose is taken between 5-6 pm. Her dinner blood glucose range is generally >200 mg/dl. An ACE inhibitor/angiotensin II receptor blocker is being taken. Eye exam is not current.  Hyperlipidemia  This is a chronic problem. Pertinent negatives include no chest pain or shortness of breath. Current antihyperlipidemic treatment includes statins (changed to lipitor last visit).  CTS - she is having tingling and numbness in the fingers of both hands. This is worse on the left. 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 She got a splint from urgent care and has been wearing that. She feels that the symptoms are slightly improved but persistent. She denies weakness or significant pain.   Lab Results  Component Value Date   HGBA1C 7.8 (H) 08/01/2016    Review of Systems  Constitutional: Negative for appetite change, fatigue, fever and  unexpected weight change.  HENT: Negative for tinnitus and trouble swallowing.   Eyes: Negative for visual disturbance.  Respiratory: Negative for cough, chest tightness and shortness of breath.   Cardiovascular: Negative for chest pain, palpitations and leg swelling.  Gastrointestinal: Negative for abdominal pain.  Endocrine: Negative for polydipsia and polyuria.  Genitourinary: Negative for dysuria and hematuria.  Musculoskeletal: Negative for arthralgias.  Neurological: Positive for numbness (in fingers of both hands). Negative for tremors and headaches.  Psychiatric/Behavioral: Negative for dysphoric mood.    Patient Active Problem List   Diagnosis Date Noted  . Bilateral carpal tunnel syndrome 11/07/2016  . Herpes simplex infection 11/23/2015  . Fibromyalgia 03/11/2015  . Perimenopause 02/17/2015  . Esophageal reflux 02/17/2015  . Overweight (BMI 25.0-29.9) 02/17/2015  . Vitamin D deficiency 02/17/2015  . DM type 2 with diabetic mixed hyperlipidemia (HCC) 07/28/2014  . Essential hypertension 07/28/2014  . DM (diabetes mellitus), type 2, uncontrolled, with renal complications (HCC) 05/18/2014    Prior to Admission medications   Medication Sig Start Date End Date Taking? Authorizing Provider  aspirin 81 MG tablet Take 81 mg by mouth daily.   Yes [provider]  atorvastatin (LIPITOR) 40 MG tablet Take 1 tablet (40 mg total) by mouth daily. 03/21/16  Yes Reubin Milan, MD  Cholecalciferol (VITAMIN D) 2000 units CAPS Take 1 capsule (2,000 Units total) by mouth daily. 02/17/15  Yes Plonk, Chrissie Noa, MD  DULoxetine (CYMBALTA) 60 MG capsule TAKE 1 CAPSULE (60 MG TOTAL) BY MOUTH DAILY. 05/26/16  Yes Reubin Milan, MD  glipiZIDE (GLUCOTROL) 5 MG tablet TAKE 1 TABLET (5 MG TOTAL) BY MOUTH 2 (  TWO) TIMES DAILY BEFORE A MEAL. 10/24/16  Yes Reubin Milan, MD  glucose blood test strip 1 each by Other route daily. Use as instructed   Yes [provider]  ibuprofen  (ADVIL,MOTRIN) 600 MG tablet Take 1 tablet (600 mg total) by mouth every 6 (six) hours as needed. 10/03/16  Yes Domenick Gong, MD  JANUVIA 100 MG tablet TAKE 1 TABLET BY MOUTH DAILY 09/20/16  Yes Reubin Milan, MD  lansoprazole (PREVACID) 15 MG capsule Take 15 mg by mouth daily as needed.   Yes [provider]  losartan (COZAAR) 100 MG tablet Take 1 tablet (100 mg total) by mouth daily. 03/19/16  Yes Reubin Milan, MD  metFORMIN (GLUCOPHAGE) 1000 MG tablet TAKE 1 TABLET (1,000 MG TOTAL) BY MOUTH 2 (TWO) TIMES DAILY WITH A MEAL. Patient taking differently: Take 500 mg by mouth 2 (two) times daily.  07/02/16  Yes Reubin Milan, MD  methocarbamol (ROBAXIN) 750 MG tablet Take 1 tablet (750 mg total) by mouth every 4 (four) hours. 10/03/16  Yes Domenick Gong, MD  metoprolol succinate (TOPROL-XL) 50 MG 24 hr tablet TAKE 1 TABLET (50 MG TOTAL) BY MOUTH DAILY. TAKE WITH OR IMMEDIATELY FOLLOWING A MEAL. 03/06/16  Yes Reubin Milan, MD  Multiple Vitamins-Minerals (MULTIVITAMIN WITH MINERALS) tablet Take 1 tablet by mouth daily.   Yes [provider]  traMADol (ULTRAM) 50 MG tablet Take 1 tablet (50 mg total) by mouth every 6 (six) hours as needed. 10/03/16  Yes Domenick Gong, MD  valACYclovir (VALTREX) 1000 MG tablet Take 1 tablet (1,000 mg total) by mouth 2 (two) times daily. 11/23/15  Yes Reubin Milan, MD    Allergies  Allergen Reactions  . Augmentin [Amoxicillin-Pot Clavulanate] Itching  . Benazepril Hcl Cough  . Penicillins Other (See Comments)  . Jardiance [Empagliflozin] Rash    Recurrent yeast vaginitis    Past Surgical History:  Procedure Laterality Date  . ABDOMINAL HYSTERECTOMY    . CERVICAL DISC SURGERY    . DILATION AND CURETTAGE OF UTERUS  1996  . HERNIA REPAIR  2000   Umblical  . HIP ARTHROPLASTY Right   . TOTAL HIP ARTHROPLASTY Left 05/18/2014   Procedure: LEFT TOTAL HIP ARTHROPLASTY ANTERIOR APPROACH;  Surgeon: Marcene Corning, MD;   Location: [facility] OR;  Service: Orthopedics;  Laterality: Left;    Social History  Substance Use Topics  . Smoking status: Former Smoker    Quit date: 06/14/1984  . Smokeless tobacco: Never Used  . Alcohol use No     Medication list has been reviewed and updated.  PHQ 2/9 Scores 08/01/2016 04/12/2016 03/19/2016 11/23/2015  PHQ - 2 Score 0 0 0 0    Physical Exam  Constitutional: She is oriented to person, place, and time. She appears well-developed. No distress.  HENT:  Head: Normocephalic and atraumatic.  Neck: Normal range of motion. Neck supple. No thyromegaly present.  Cardiovascular: Normal rate, regular rhythm and normal heart sounds.   Pulmonary/Chest: Effort normal and breath sounds normal. No respiratory distress. She has no wheezes.  Musculoskeletal: Normal range of motion. She exhibits no edema or tenderness.  Neurological: She is alert and oriented to person, place, and time. She has normal strength and normal reflexes. A sensory deficit is present.  Bilateral Tinel's and Phalen's positive  Skin: Skin is warm and dry. No rash noted.  Psychiatric: She has a normal mood and affect. Her speech is normal and behavior is normal. Thought content normal.  Nursing note  and vitals reviewed.   BP (!) 142/86   Pulse 100   Ht 5' (1.524 m)   Wt 136 lb (61.7 kg)   LMP  (LMP Unknown)   SpO2 99%   BMI 26.56 kg/m   Assessment and Plan: 1. Essential hypertension Only fair control Stop metoprolol and start samples of Bystolic 10 mg  2. Uncontrolled type 2 diabetes mellitus with stage 3 chronic kidney disease, without long-term current use of insulin (HCC) Check labs; likely start Trulicity (best covered by ins) - Comprehensive metabolic panel - Hemoglobin A1c  3. DM type 2 with diabetic mixed hyperlipidemia (HCC) Now on Atorvastatin with no side effects  4. Bilateral carpal tunnel syndrome Continue wrist splints while sleeping Nsaids if needed   No orders of the defined types  were placed in this encounter.   Partially dictated using Animal nutritionist. Any errors are unintentional.  Bari Edward, MD [facility]  11/07/2016

## 2016-11-08 ENCOUNTER — Other Ambulatory Visit: Payer: Self-pay | Admitting: Internal Medicine

## 2016-11-08 LAB — COMPREHENSIVE METABOLIC PANEL
ALK PHOS: 81 IU/L (ref 39–117)
ALT: 49 IU/L — AB (ref 0–32)
AST: 50 IU/L — AB (ref 0–40)
Albumin/Globulin Ratio: 1.8 (ref 1.2–2.2)
Albumin: 4.6 g/dL (ref 3.5–5.5)
BUN/Creatinine Ratio: 20 (ref 9–23)
BUN: 17 mg/dL (ref 6–24)
Bilirubin Total: 0.2 mg/dL (ref 0.0–1.2)
CALCIUM: 9.8 mg/dL (ref 8.7–10.2)
CO2: 23 mmol/L (ref 20–29)
CREATININE: 0.87 mg/dL (ref 0.57–1.00)
Chloride: 98 mmol/L (ref 96–106)
GFR calc Af Amer: 85 mL/min/{1.73_m2} (ref >59)
GFR calc non Af Amer: 74 mL/min/{1.73_m2} (ref >59)
GLOBULIN, TOTAL: 2.6 g/dL (ref 1.5–4.5)
GLUCOSE: 174 mg/dL — AB (ref 65–99)
Potassium: 4.4 mmol/L (ref 3.5–5.2)
Sodium: 139 mmol/L (ref 134–144)
Total Protein: 7.2 g/dL (ref 6.0–8.5)

## 2016-11-08 LAB — HEMOGLOBIN A1C
ESTIMATED AVERAGE GLUCOSE: 180 mg/dL
HEMOGLOBIN A1C: 7.9 % — AB (ref 4.8–5.6)

## 2016-11-09 NOTE — Progress Notes (Signed)
Pt informed of Dr Jaclynn Guarneri note and scheduled for appt to discuss and try trulicity.

## 2016-11-13 ENCOUNTER — Ambulatory Visit (INDEPENDENT_AMBULATORY_CARE_PROVIDER_SITE_OTHER): Payer: 59 | Admitting: Internal Medicine

## 2016-11-13 ENCOUNTER — Encounter: Payer: Self-pay | Admitting: Internal Medicine

## 2016-11-13 VITALS — BP 136/88 | HR 73 | Ht 60.0 in | Wt 136.0 lb

## 2016-11-13 DIAGNOSIS — E1165 Type 2 diabetes mellitus with hyperglycemia: Secondary | ICD-10-CM

## 2016-11-13 DIAGNOSIS — E1129 Type 2 diabetes mellitus with other diabetic kidney complication: Secondary | ICD-10-CM

## 2016-11-13 DIAGNOSIS — IMO0002 Reserved for concepts with insufficient information to code with codable children: Secondary | ICD-10-CM

## 2016-11-13 MED ORDER — DULAGLUTIDE 0.75 MG/0.5ML ~~LOC~~ SOAJ: 0.7500 mg | SUBCUTANEOUS | 5 refills | Status: DC

## 2016-11-13 NOTE — Patient Instructions (Signed)
Stop Glipizide but continue metformin and Januvia

## 2016-11-13 NOTE — Progress Notes (Signed)
Date:  11/13/2016   Name:  Tracy Bryan   DOB:  [birth year]   MRN:  [id number]   Chief Complaint: instructions (Started New Medication for Trulicity. Instructions and samples. )  Lab Results  Component Value Date   HGBA1C 7.9 (H) 11/07/2016   Patient presents to begin to a sitting injections. She is currently on Januvia and metformin and glipizide. She checked with her insurance and can't elicit he had the best coverage in the class.   Review of Systems  Constitutional: Negative for chills, fatigue and fever.  Respiratory: Negative for chest tightness and shortness of breath.   Cardiovascular: Negative for chest pain.    Patient Active Problem List   Diagnosis Date Noted  . Bilateral carpal tunnel syndrome 11/07/2016  . Herpes simplex infection 11/23/2015  . Fibromyalgia 03/11/2015  . Perimenopause 02/17/2015  . Esophageal reflux 02/17/2015  . Overweight (BMI 25.0-29.9) 02/17/2015  . Vitamin D deficiency 02/17/2015  . DM type 2 with diabetic mixed hyperlipidemia (HCC) 07/28/2014  . Essential hypertension 07/28/2014  . DM (diabetes mellitus), type 2, uncontrolled, with renal complications (HCC) 05/18/2014    Prior to Admission medications   Medication Sig Start Date End Date Taking? Authorizing Provider  aspirin 81 MG tablet Take 81 mg by mouth daily.   Yes [provider]  atorvastatin (LIPITOR) 40 MG tablet TAKE 1 TABLET (40 MG TOTAL) BY MOUTH DAILY. 11/08/16  Yes Reubin Milan, MD  Cholecalciferol (VITAMIN D) 2000 units CAPS Take 1 capsule (2,000 Units total) by mouth daily. 02/17/15  Yes Plonk, Chrissie Noa, MD  DULoxetine (CYMBALTA) 60 MG capsule TAKE 1 CAPSULE (60 MG TOTAL) BY MOUTH DAILY. 05/26/16  Yes Reubin Milan, MD  glipiZIDE (GLUCOTROL) 5 MG tablet TAKE 1 TABLET (5 MG TOTAL) BY MOUTH 2 (TWO) TIMES DAILY BEFORE A MEAL. 10/24/16  Yes Reubin Milan, MD  glucose blood test strip 1 each by Other route daily. Use as instructed   Yes [provider]  ibuprofen (ADVIL,MOTRIN) 600 MG tablet Take 1 tablet (600 mg total) by mouth every 6 (six) hours as needed. 10/03/16  Yes Domenick Gong, MD  JANUVIA 100 MG tablet TAKE 1 TABLET BY MOUTH DAILY 09/20/16  Yes Reubin Milan, MD  lansoprazole (PREVACID) 15 MG capsule Take 15 mg by mouth daily as needed.   Yes [provider]  losartan (COZAAR) 100 MG tablet Take 1 tablet (100 mg total) by mouth daily. 03/19/16  Yes Reubin Milan, MD  metFORMIN (GLUCOPHAGE) 1000 MG tablet TAKE 1 TABLET (1,000 MG TOTAL) BY MOUTH 2 (TWO) TIMES DAILY WITH A MEAL. Patient taking differently: Take 500 mg by mouth 2 (two) times daily.  07/02/16  Yes Reubin Milan, MD  methocarbamol (ROBAXIN) 750 MG tablet Take 1 tablet (750 mg total) by mouth every 4 (four) hours. 10/03/16  Yes Domenick Gong, MD  metoprolol succinate (TOPROL-XL) 50 MG 24 hr tablet TAKE 1 TABLET (50 MG TOTAL) BY MOUTH DAILY. TAKE WITH OR IMMEDIATELY FOLLOWING A MEAL. 03/06/16  Yes Reubin Milan, MD  Multiple Vitamins-Minerals (MULTIVITAMIN WITH MINERALS) tablet Take 1 tablet by mouth daily.   Yes [provider]  traMADol (ULTRAM) 50 MG tablet Take 1 tablet (50 mg total) by mouth every 6 (six) hours as needed. 10/03/16  Yes Domenick Gong, MD  valACYclovir (VALTREX) 1000 MG tablet Take 1 tablet (1,000 mg total) by mouth 2 (two) times daily. 11/23/15  Yes Reubin Milan, MD  Dulaglutide (TRULICITY) 0.75  MG/0.5ML SOPN Inject 0.75 mg into the skin once a week. 11/13/16   Reubin Milan, MD    Allergies  Allergen Reactions  . Augmentin [Amoxicillin-Pot Clavulanate] Itching  . Benazepril Hcl Cough  . Penicillins Other (See Comments)  . Jardiance [Empagliflozin] Rash    Recurrent yeast vaginitis    Past Surgical History:  Procedure Laterality Date  . ABDOMINAL HYSTERECTOMY    . CERVICAL DISC SURGERY    . DILATION AND CURETTAGE OF UTERUS  1996  . HERNIA REPAIR  2000   Umblical  . HIP ARTHROPLASTY  Right   . TOTAL HIP ARTHROPLASTY Left 05/18/2014   Procedure: LEFT TOTAL HIP ARTHROPLASTY ANTERIOR APPROACH;  Surgeon: Marcene Corning, MD;  Location: [facility] OR;  Service: Orthopedics;  Laterality: Left;    Social History  Substance Use Topics  . Smoking status: Former Smoker    Quit date: 06/14/1984  . Smokeless tobacco: Never Used  . Alcohol use No     Medication list has been reviewed and updated.  PHQ 2/9 Scores 08/01/2016 04/12/2016 03/19/2016 11/23/2015  PHQ - 2 Score 0 0 0 0    Physical Exam  Constitutional: She is oriented to person, place, and time. She appears well-developed. No distress.  HENT:  Head: Normocephalic and atraumatic.  Pulmonary/Chest: Effort normal. No respiratory distress.  Musculoskeletal: Normal range of motion.  Neurological: She is alert and oriented to person, place, and time.  Skin: Skin is warm and dry. No rash noted.  Psychiatric: She has a normal mood and affect. Her behavior is normal. Thought content normal.  Nursing note and vitals reviewed.  Pt is instructed in the use of Trulicity.  She was able to demonstrate good technique with the demo device.  She feels comfortable with the injection and will begin at home this weekend.  BP 136/88   Pulse 73   Ht 5' (1.524 m)   Wt 136 lb (61.7 kg)   LMP  (LMP Unknown)   SpO2 98%   BMI 26.56 kg/m   Assessment and Plan: 1. DM (diabetes mellitus), type 2, uncontrolled, with renal complications (HCC) Stop glipizide but continue metformin and januvia for now Continue .75 mg weekly until next visit Call if problems or side effects - Dulaglutide (TRULICITY) 0.75 MG/0.5ML SOPN; Inject 0.75 mg into the skin once a week.  Dispense: 4 pen; Refill: 5   Meds ordered this encounter  Medications  . Dulaglutide (TRULICITY) 0.75 MG/0.5ML SOPN    Sig: Inject 0.75 mg into the skin once a week.    Dispense:  4 pen    Refill:  5    Partially dictated using Animal nutritionist. Any errors are unintentional.  Bari Edward, MD [facility]  11/13/2016

## 2016-12-03 ENCOUNTER — Other Ambulatory Visit: Payer: Self-pay | Admitting: Internal Medicine

## 2016-12-11 ENCOUNTER — Encounter: Payer: Self-pay | Admitting: Internal Medicine

## 2016-12-11 ENCOUNTER — Ambulatory Visit (INDEPENDENT_AMBULATORY_CARE_PROVIDER_SITE_OTHER): Payer: 59 | Admitting: Internal Medicine

## 2016-12-11 VITALS — BP 142/84 | HR 90 | Ht 60.0 in | Wt 132.0 lb

## 2016-12-11 DIAGNOSIS — I10 Essential (primary) hypertension: Secondary | ICD-10-CM | POA: Diagnosis not present

## 2016-12-11 DIAGNOSIS — E1165 Type 2 diabetes mellitus with hyperglycemia: Secondary | ICD-10-CM | POA: Diagnosis not present

## 2016-12-11 DIAGNOSIS — K219 Gastro-esophageal reflux disease without esophagitis: Secondary | ICD-10-CM | POA: Diagnosis not present

## 2016-12-11 DIAGNOSIS — E1129 Type 2 diabetes mellitus with other diabetic kidney complication: Secondary | ICD-10-CM | POA: Diagnosis not present

## 2016-12-11 DIAGNOSIS — IMO0002 Reserved for concepts with insufficient information to code with codable children: Secondary | ICD-10-CM

## 2016-12-11 MED ORDER — NEBIVOLOL HCL 10 MG PO TABS: 10.0000 mg | ORAL_TABLET | Freq: Every day | ORAL | 5 refills | Status: DC

## 2016-12-11 NOTE — Progress Notes (Signed)
Date:  12/11/2016   Name:  Tracy Bryan   DOB:  1960/08/30   MRN:  [id number]   Chief Complaint: Gastroesophageal Reflux (Take prevacid- always worked but has not helped latley. Burping for long periods- and husband states smells bad. When burping smells like eggs/ or sulfar. Stomach always hurting. Feels like all this started after trulicity shot was started. But Shot is working for DM. ) and Hypertension (Needs refill on Bystolic. Was given samples and ran out a week ago. ) Gastroesophageal Reflux  She complains of abdominal pain, belching, dysphagia, early satiety, nausea and water brash. She reports no chest pain, no heartburn or no sore throat. This is a recurrent problem. The problem occurs frequently. The problem has been gradually worsening. Exacerbated by: since starting Trulicity. Pertinent negatives include no fatigue. There are no known risk factors. She has tried a PPI for the symptoms. Past procedures do not include an abdominal ultrasound, an EGD, H. pylori antibody titer or a UGI.      Review of Systems  Constitutional: Negative for chills, fatigue, fever and unexpected weight change.  HENT: Negative for sore throat.   Respiratory: Negative for chest tightness and shortness of breath.   Cardiovascular: Negative for chest pain and palpitations.  Gastrointestinal: Positive for abdominal pain, dysphagia, nausea and vomiting. Negative for blood in stool, diarrhea and heartburn.  Neurological: Negative for dizziness and headaches.    Patient Active Problem List   Diagnosis Date Noted  . Bilateral carpal tunnel syndrome 11/07/2016  . Herpes simplex infection 11/23/2015  . Fibromyalgia 03/11/2015  . Perimenopause 02/17/2015  . Esophageal reflux 02/17/2015  . Overweight (BMI 25.0-29.9) 02/17/2015  . Vitamin D deficiency 02/17/2015  . DM type 2 with diabetic mixed hyperlipidemia (HCC) 07/28/2014  . Essential hypertension 07/28/2014  . DM (diabetes mellitus), type 2,  uncontrolled, with renal complications (HCC) 05/18/2014    Prior to Admission medications   Medication Sig Start Date End Date Taking? Authorizing Provider  aspirin 81 MG tablet Take 81 mg by mouth daily.    [provider]  atorvastatin (LIPITOR) 40 MG tablet TAKE 1 TABLET (40 MG TOTAL) BY MOUTH DAILY. 11/08/16   Reubin Milan, MD  Cholecalciferol (VITAMIN D) 2000 units CAPS Take 1 capsule (2,000 Units total) by mouth daily. 02/17/15   Plonk, Chrissie Noa, MD  Dulaglutide (TRULICITY) 0.75 MG/0.5ML SOPN Inject 0.75 mg into the skin once a week. 11/13/16   Reubin Milan, MD  DULoxetine (CYMBALTA) 60 MG capsule TAKE 1 CAPSULE (60 MG TOTAL) BY MOUTH DAILY. 05/26/16   Reubin Milan, MD  glipiZIDE (GLUCOTROL) 5 MG tablet TAKE 1 TABLET (5 MG TOTAL) BY MOUTH 2 (TWO) TIMES DAILY BEFORE A MEAL. 10/24/16   Reubin Milan, MD  glucose blood test strip 1 each by Other route daily. Use as instructed    [provider]  ibuprofen (ADVIL,MOTRIN) 600 MG tablet Take 1 tablet (600 mg total) by mouth every 6 (six) hours as needed. 10/03/16   Domenick Gong, MD  JANUVIA 100 MG tablet TAKE 1 TABLET BY MOUTH DAILY 09/20/16   Reubin Milan, MD  lansoprazole (PREVACID) 15 MG capsule Take 15 mg by mouth daily as needed.    [provider]  losartan (COZAAR) 100 MG tablet Take 1 tablet (100 mg total) by mouth daily. 03/19/16   Reubin Milan, MD  metFORMIN (GLUCOPHAGE) 1000 MG tablet TAKE 1 TABLET (1,000 MG TOTAL) BY MOUTH 2 (TWO) TIMES DAILY WITH A MEAL.  Patient taking differently: Take 500 mg by mouth 2 (two) times daily.  07/02/16   Reubin MilanBerglund, Laura H, MD  methocarbamol (ROBAXIN) 750 MG tablet Take 1 tablet (750 mg total) by mouth every 4 (four) hours. 10/03/16   Domenick GongMortenson, Ashley, MD  metoprolol succinate (TOPROL-XL) 50 MG 24 hr tablet TAKE 1 TABLET (50 MG TOTAL) BY MOUTH DAILY. TAKE WITH OR IMMEDIATELY FOLLOWING A MEAL. 12/03/16   Reubin MilanBerglund, Laura H, MD  Multiple Vitamins-Minerals  (MULTIVITAMIN WITH MINERALS) tablet Take 1 tablet by mouth daily.    [provider]  traMADol (ULTRAM) 50 MG tablet Take 1 tablet (50 mg total) by mouth every 6 (six) hours as needed. 10/03/16   Domenick GongMortenson, Ashley, MD  valACYclovir (VALTREX) 1000 MG tablet Take 1 tablet (1,000 mg total) by mouth 2 (two) times daily. 11/23/15   Reubin MilanBerglund, Laura H, MD    Allergies  Allergen Reactions  . Augmentin [Amoxicillin-Pot Clavulanate] Itching  . Benazepril Hcl Cough  . Penicillins Other (See Comments)  . Jardiance [Empagliflozin] Rash    Recurrent yeast vaginitis    Past Surgical History:  Procedure Laterality Date  . ABDOMINAL HYSTERECTOMY    . CERVICAL DISC SURGERY    . DILATION AND CURETTAGE OF UTERUS  1996  . HERNIA REPAIR  2000   Umblical  . HIP ARTHROPLASTY Right   . TOTAL HIP ARTHROPLASTY Left 05/18/2014   Procedure: LEFT TOTAL HIP ARTHROPLASTY ANTERIOR APPROACH;  Surgeon: Marcene CorningPeter Dalldorf, MD;  Location: [facility] OR;  Service: Orthopedics;  Laterality: Left;    Social History  Substance Use Topics  . Smoking status: Former Smoker    Quit date: 06/14/1984  . Smokeless tobacco: Never Used  . Alcohol use No     Medication list has been reviewed and updated.  PHQ 2/9 Scores 08/01/2016 04/12/2016 03/19/2016 11/23/2015  PHQ - 2 Score 0 0 0 0    Physical Exam  Constitutional: She is oriented to person, place, and time. She appears well-developed. No distress.  HENT:  Head: Normocephalic and atraumatic.  Cardiovascular: Normal rate, regular rhythm and normal heart sounds.   Pulmonary/Chest: Effort normal and breath sounds normal. No respiratory distress.  Abdominal: Soft. Normal appearance and bowel sounds are normal. There is tenderness in the epigastric area. There is no rigidity, no rebound and no guarding.  Musculoskeletal: Normal range of motion.  Neurological: She is alert and oriented to person, place, and time.  Skin: Skin is warm and dry. No rash noted.  Psychiatric: She has a  normal mood and affect. Her speech is normal and behavior is normal. Thought content normal.  Nursing note and vitals reviewed.   BP (!) 142/84   Pulse 90   Ht 5' (1.524 m)   Wt 132 lb (59.9 kg)   LMP  (LMP Unknown)   SpO2 99%   BMI 25.78 kg/m   Assessment and Plan: 1. Gastroesophageal reflux disease, esophagitis presence not specified No previous evaluation Begin Dexilant 60 mg daily in place of prevacid May need EGD Some sx may be exacerbated by Trulicity - H. pylori antibody, IgG - DG UGI W/SMALL BOWEL; Future  2. Essential hypertension improved - nebivolol (BYSTOLIC) 10 MG tablet; Take 1 tablet (10 mg total) by mouth daily.  Dispense: 30 tablet; Refill: 5   Meds ordered this encounter  Medications  . nebivolol (BYSTOLIC) 10 MG tablet    Sig: Take 1 tablet (10 mg total) by mouth daily.    Dispense:  30 tablet    Refill:  5  Partially dictated using Animal nutritionist. Any errors are unintentional.  Bari Edward, MD [facility]  12/11/2016

## 2016-12-12 LAB — H. PYLORI ANTIBODY, IGG: H. pylori, IgG AbS: <0.8 Index Value (ref 0.00–0.79)

## 2016-12-20 ENCOUNTER — Ambulatory Visit
Admission: RE | Admit: 2016-12-20 | Discharge: 2016-12-20 | Disposition: A | Discharge status: Home / Self Care | Payer: 59 | Source: Ambulatory Visit | Attending: Internal Medicine | Admitting: Internal Medicine

## 2016-12-20 DIAGNOSIS — R109 Unspecified abdominal pain: Secondary | ICD-10-CM | POA: Diagnosis not present

## 2016-12-20 DIAGNOSIS — K219 Gastro-esophageal reflux disease without esophagitis: Secondary | ICD-10-CM | POA: Insufficient documentation

## 2016-12-21 NOTE — Progress Notes (Signed)
Patients need refill on Dexilant and insurance will not cover Bystolic medication. Would like to try something different for Bp. Please Advise.

## 2016-12-22 ENCOUNTER — Other Ambulatory Visit: Payer: Self-pay | Admitting: Internal Medicine

## 2016-12-22 MED ORDER — DEXLANSOPRAZOLE 60 MG PO CPDR: 60.0000 mg | DELAYED_RELEASE_CAPSULE | Freq: Every day | ORAL | 5 refills | Status: DC

## 2016-12-22 MED ORDER — METOPROLOL SUCCINATE ER 50 MG PO TB24: 50.0000 mg | ORAL_TABLET | Freq: Two times a day (BID) | ORAL | 2 refills | Status: DC

## 2016-12-24 ENCOUNTER — Telehealth: Payer: Self-pay

## 2016-12-24 NOTE — Telephone Encounter (Signed)
Called patient insurance and did PA for Dexilant medication. Was told by telephone we will have a response within 48 hours with Fax to office. Awaiting results.

## 2016-12-26 ENCOUNTER — Other Ambulatory Visit: Payer: Self-pay | Admitting: Internal Medicine

## 2016-12-26 ENCOUNTER — Telehealth: Payer: Self-pay

## 2016-12-26 MED ORDER — RABEPRAZOLE SODIUM 20 MG PO TBEC: 20.0000 mg | DELAYED_RELEASE_TABLET | Freq: Every day | ORAL | 0 refills | Status: DC

## 2016-12-26 NOTE — Telephone Encounter (Signed)
Patient insurance denied coverage for Dexilant medication because the patient has to try Aciphex medication first with a failed attempt with the medication. Please send medication in for patient.

## 2017-02-06 ENCOUNTER — Other Ambulatory Visit: Payer: Self-pay | Admitting: Internal Medicine

## 2017-02-06 DIAGNOSIS — Z1231 Encounter for screening mammogram for malignant neoplasm of breast: Secondary | ICD-10-CM

## 2017-02-15 ENCOUNTER — Encounter: Payer: Self-pay | Admitting: Internal Medicine

## 2017-02-15 ENCOUNTER — Ambulatory Visit: Payer: 59 | Admitting: Internal Medicine

## 2017-02-15 VITALS — BP 134/84 | HR 77 | Ht 60.0 in | Wt 122.0 lb

## 2017-02-15 DIAGNOSIS — I10 Essential (primary) hypertension: Secondary | ICD-10-CM | POA: Diagnosis not present

## 2017-02-15 DIAGNOSIS — E1129 Type 2 diabetes mellitus with other diabetic kidney complication: Secondary | ICD-10-CM

## 2017-02-15 DIAGNOSIS — E1165 Type 2 diabetes mellitus with hyperglycemia: Secondary | ICD-10-CM

## 2017-02-15 DIAGNOSIS — IMO0002 Reserved for concepts with insufficient information to code with codable children: Secondary | ICD-10-CM

## 2017-02-15 DIAGNOSIS — N3000 Acute cystitis without hematuria: Secondary | ICD-10-CM | POA: Diagnosis not present

## 2017-02-15 LAB — POCT URINALYSIS DIPSTICK
BILIRUBIN UA: NEGATIVE
Glucose, UA: NEGATIVE
Ketones, UA: NEGATIVE
NITRITE UA: POSITIVE
PH UA: 6 (ref 5.0–8.0)
Protein, UA: NEGATIVE
RBC UA: NEGATIVE
Spec Grav, UA: >=1.03 — AB (ref 1.010–1.025)
UROBILINOGEN UA: 0.2 U/dL

## 2017-02-15 MED ORDER — CIPROFLOXACIN HCL 250 MG PO TABS: 250.0000 mg | ORAL_TABLET | Freq: Two times a day (BID) | ORAL | 0 refills | Status: AC

## 2017-02-15 NOTE — Progress Notes (Signed)
Date:  02/15/2017   Name:  Tracy Bryan   DOB:  [birth year]   MRN:  [id number]   Chief Complaint: Diabetes (BS- 136) and Urinary Tract Infection (More frequent urination- burning while urinating. ) She has had some nausea and decreased appetite and has lost 15 lbs.  She is happy with the weight loss.  She feels that the side effects are improving and she wants to continue the medications.   Diabetes  She presents for her follow-up diabetic visit. She has type 2 diabetes mellitus. Pertinent negatives for hypoglycemia include no headaches or tremors. Pertinent negatives for diabetes include no chest pain, no fatigue, no polydipsia and no polyuria. Current diabetic treatment includes oral agent (triple therapy) (trulicity, januvia, metformin). She is compliant with treatment all of the time.  Hypertension  This is a chronic problem. The problem is controlled. Pertinent negatives include no chest pain, headaches, palpitations or shortness of breath.  Dysuria   This is a new problem. The current episode started in the past 7 days. The quality of the pain is described as burning. There has been no fever. Associated symptoms include frequency and urgency. Pertinent negatives include no hematuria. She has tried nothing for the symptoms.    Lab Results  Component Value Date   HGBA1C 7.9 (H) 11/07/2016     Review of Systems  Constitutional: Negative for appetite change, fatigue, fever and unexpected weight change.  HENT: Negative for tinnitus and trouble swallowing.   Eyes: Negative for visual disturbance.  Respiratory: Negative for cough, chest tightness and shortness of breath.   Cardiovascular: Negative for chest pain, palpitations and leg swelling.  Gastrointestinal: Negative for abdominal pain.  Endocrine: Negative for polydipsia and polyuria.  Genitourinary: Positive for frequency and urgency. Negative for dysuria and hematuria.  Musculoskeletal: Negative for arthralgias.    Neurological: Negative for tremors, numbness and headaches.  Psychiatric/Behavioral: Negative for dysphoric mood.    Patient Active Problem List   Diagnosis Date Noted  . Bilateral carpal tunnel syndrome 11/07/2016  . Herpes simplex infection 11/23/2015  . Fibromyalgia 03/11/2015  . Perimenopause 02/17/2015  . Esophageal reflux 02/17/2015  . Overweight (BMI 25.0-29.9) 02/17/2015  . Vitamin D deficiency 02/17/2015  . DM type 2 with diabetic mixed hyperlipidemia (HCC) 07/28/2014  . Essential hypertension 07/28/2014  . DM (diabetes mellitus), type 2, uncontrolled, with renal complications (HCC) 05/18/2014    Prior to Admission medications   Medication Sig Start Date End Date Taking? Authorizing Provider  aspirin 81 MG tablet Take 81 mg by mouth daily.    [provider]  atorvastatin (LIPITOR) 40 MG tablet TAKE 1 TABLET (40 MG TOTAL) BY MOUTH DAILY. 11/08/16   Reubin Milan, MD  Cholecalciferol (VITAMIN D) 2000 units CAPS Take 1 capsule (2,000 Units total) by mouth daily. 02/17/15   Plonk, Chrissie Noa, MD  dexlansoprazole (DEXILANT) 60 MG capsule Take 1 capsule (60 mg total) daily by mouth. 12/22/16   Reubin Milan, MD  Dulaglutide (TRULICITY) 0.75 MG/0.5ML SOPN Inject 0.75 mg into the skin once a week. 11/13/16   Reubin Milan, MD  DULoxetine (CYMBALTA) 60 MG capsule TAKE 1 CAPSULE (60 MG TOTAL) BY MOUTH DAILY. 05/26/16   Reubin Milan, MD  glucose blood test strip 1 each by Other route daily. Use as instructed    [provider]  ibuprofen (ADVIL,MOTRIN) 600 MG tablet Take 1 tablet (600 mg total) by mouth every 6 (six) hours as needed. 10/03/16   Domenick Gong, MD  JANUVIA 100 MG tablet TAKE 1 TABLET BY MOUTH DAILY 09/20/16   Reubin Milan, MD  losartan (COZAAR) 100 MG tablet Take 1 tablet (100 mg total) by mouth daily. 03/19/16   Reubin Milan, MD  metFORMIN (GLUCOPHAGE) 1000 MG tablet TAKE 1 TABLET (1,000 MG TOTAL) BY MOUTH 2 (TWO) TIMES DAILY WITH A  MEAL. Patient taking differently: Take 500 mg by mouth 2 (two) times daily.  07/02/16   Reubin Milan, MD  methocarbamol (ROBAXIN) 750 MG tablet Take 1 tablet (750 mg total) by mouth every 4 (four) hours. 10/03/16   Domenick Gong, MD  metoprolol succinate (TOPROL-XL) 50 MG 24 hr tablet Take 1 tablet (50 mg total) 2 (two) times daily by mouth. Take with or immediately following a meal. 12/22/16   Reubin Milan, MD  Multiple Vitamins-Minerals (MULTIVITAMIN WITH MINERALS) tablet Take 1 tablet by mouth daily.    [provider]  RABEprazole (ACIPHEX) 20 MG tablet TAKE 1 TABLET (20 MG TOTAL) DAILY BY MOUTH. 02/07/17   Reubin Milan, MD  traMADol (ULTRAM) 50 MG tablet Take 1 tablet (50 mg total) by mouth every 6 (six) hours as needed. 10/03/16   Domenick Gong, MD  valACYclovir (VALTREX) 1000 MG tablet Take 1 tablet (1,000 mg total) by mouth 2 (two) times daily. 11/23/15   Reubin Milan, MD    Allergies  Allergen Reactions  . Rabeprazole Sodium     C Diff Side Affects  . Augmentin [Amoxicillin-Pot Clavulanate] Itching  . Benazepril Hcl Cough  . Penicillins Other (See Comments)  . Jardiance [Empagliflozin] Rash    Recurrent yeast vaginitis    Past Surgical History:  Procedure Laterality Date  . ABDOMINAL HYSTERECTOMY    . CERVICAL DISC SURGERY    . DILATION AND CURETTAGE OF UTERUS  1996  . HERNIA REPAIR  2000   Umblical  . HIP ARTHROPLASTY Right   . TOTAL HIP ARTHROPLASTY Left 05/18/2014   Procedure: LEFT TOTAL HIP ARTHROPLASTY ANTERIOR APPROACH;  Surgeon: Marcene Corning, MD;  Location: [facility] OR;  Service: Orthopedics;  Laterality: Left;    Social History   Tobacco Use  . Smoking status: Former Smoker    Last attempt to quit: 06/14/1984    Years since quitting: 32.6  . Smokeless tobacco: Never Used  Substance Use Topics  . Alcohol use: No    Alcohol/week: 0.0 oz  . Drug use: No     Medication list has been reviewed and updated.  PHQ 2/9 Scores 08/01/2016  04/12/2016 03/19/2016 11/23/2015  PHQ - 2 Score 0 0 0 0    Physical Exam  Constitutional: She is oriented to person, place, and time. She appears well-developed. No distress.  HENT:  Head: Normocephalic and atraumatic.  Neck: Normal range of motion. Neck supple.  Cardiovascular: Normal rate, regular rhythm and normal heart sounds.  Pulmonary/Chest: Effort normal and breath sounds normal. No respiratory distress. She has no wheezes.  Musculoskeletal: Normal range of motion. She exhibits no edema.  Neurological: She is alert and oriented to person, place, and time.  Skin: Skin is warm and dry. No rash noted.  Psychiatric: She has a normal mood and affect. Her behavior is normal. Thought content normal.  Nursing note and vitals reviewed.  Urine dipstick shows positive for nitrates and positive for leukocytes.  Micro exam: not done.  BP 134/84   Pulse 77   Ht 5' (1.524 m)   Wt 122 lb (55.3 kg)   LMP  (LMP Unknown)  SpO2 100%   BMI 23.83 kg/m   Assessment and Plan: 1. DM (diabetes mellitus), type 2, uncontrolled, with renal complications (HCC) Improved May be able to stop Januvia - Hemoglobin A1c  2. Essential hypertension controlled  3. Acute cystitis without hematuria cipro x 7 days   No orders of the defined types were placed in this encounter.   Partially dictated using Animal nutritionistDragon software. Any errors are unintentional.  Bari EdwardLaura Ludia Gartland, MD [facility]  02/15/2017

## 2017-02-16 LAB — HEMOGLOBIN A1C
Est. average glucose Bld gHb Est-mCnc: 154 mg/dL
Hgb A1c MFr Bld: 7 % — ABNORMAL HIGH (ref 4.8–5.6)

## 2017-02-16 LAB — MICROALBUMIN / CREATININE URINE RATIO
CREATININE, UR: 232.2 mg/dL
MICROALB/CREAT RATIO: 858.3 mg/g{creat} — AB (ref 0.0–30.0)
Microalbumin, Urine: 1993 ug/mL

## 2017-02-20 ENCOUNTER — Ambulatory Visit
Admission: RE | Admit: 2017-02-20 | Discharge: 2017-02-20 | Disposition: A | Discharge status: Home / Self Care | Payer: 59 | Source: Ambulatory Visit | Attending: Internal Medicine | Admitting: Internal Medicine

## 2017-02-20 DIAGNOSIS — Z1231 Encounter for screening mammogram for malignant neoplasm of breast: Secondary | ICD-10-CM | POA: Insufficient documentation

## 2017-03-07 ENCOUNTER — Other Ambulatory Visit: Payer: Self-pay | Admitting: Internal Medicine

## 2017-03-07 DIAGNOSIS — IMO0002 Reserved for concepts with insufficient information to code with codable children: Secondary | ICD-10-CM

## 2017-03-07 DIAGNOSIS — E1165 Type 2 diabetes mellitus with hyperglycemia: Principal | ICD-10-CM

## 2017-03-07 DIAGNOSIS — E1122 Type 2 diabetes mellitus with diabetic chronic kidney disease: Secondary | ICD-10-CM

## 2017-03-07 DIAGNOSIS — N183 Chronic kidney disease, stage 3 (moderate): Principal | ICD-10-CM

## 2017-03-15 ENCOUNTER — Other Ambulatory Visit: Payer: Self-pay | Admitting: Internal Medicine

## 2017-03-15 DIAGNOSIS — I10 Essential (primary) hypertension: Secondary | ICD-10-CM

## 2017-04-29 ENCOUNTER — Other Ambulatory Visit: Payer: Self-pay

## 2017-04-29 ENCOUNTER — Ambulatory Visit
Admission: EM | Admit: 2017-04-29 | Discharge: 2017-04-29 | Disposition: A | Discharge status: Home / Self Care | Payer: 59 | Attending: Family Medicine | Admitting: Family Medicine

## 2017-04-29 DIAGNOSIS — S86911A Strain of unspecified muscle(s) and tendon(s) at lower leg level, right leg, initial encounter: Secondary | ICD-10-CM

## 2017-04-29 DIAGNOSIS — M25561 Pain in right knee: Secondary | ICD-10-CM | POA: Diagnosis not present

## 2017-04-29 MED ORDER — MELOXICAM 7.5 MG PO TABS: 7.5000 mg | ORAL_TABLET | Freq: Every day | ORAL | 0 refills | Status: DC

## 2017-04-29 NOTE — ED Provider Notes (Signed)
[facility] ____________________________________________  Time seen: Approximately 9:32 AM  I have reviewed the triage vital signs and the nursing notes.   HISTORY  Chief Complaint Knee Pain (Right)  HPI Tracy Bryan is a 57 y.o. female presenting for evaluation of right knee pain present since Saturday night.  Patient states Saturday evening while she was hanging close up she twisted and she felt a pop to her knee with a slight pain.  States when she woke up middle Saturday night her knee felt swollen and had more of a throbbing aching pain.  Has continue to remain active and ambulatory.  Denies any fall or direct trauma.  Denies history of recurrent pain to knees.  Previous bilateral hip replacements.  States did take some Tylenol yesterday without much change.  Denies pain radiation, paresthesias or skin changes.  Reports otherwise feels well.  No fevers.  Denies other complaints. Denies chest pain, shortness of breath, abdominal pain, dysuria, or rash. Denies recent sickness. Denies recent antibiotic use.   Reubin Milan, MD: PCP   Past Medical History:  Diagnosis Date  . Arthritis   . Diabetes mellitus without complication (HCC)    Type 2  . Disseminated intravascular coagulation (HCC) 1999   After child birth  . GERD (gastroesophageal reflux disease)   . Herpes simplex   . Hypercholesteremia   . Hypertension   . Migraines   . Pneumonia   . PONV (postoperative nausea and vomiting)     Patient Active Problem List   Diagnosis Date Noted  . Bilateral carpal tunnel syndrome 11/07/2016  . Herpes simplex infection 11/23/2015  . Fibromyalgia 03/11/2015  . Perimenopause 02/17/2015  . Esophageal reflux 02/17/2015  . Overweight (BMI 25.0-29.9) 02/17/2015  . Vitamin D deficiency 02/17/2015  . DM type 2 with diabetic mixed hyperlipidemia (HCC) 07/28/2014  . Essential hypertension 07/28/2014  . DM (diabetes mellitus), type 2, uncontrolled, with renal  complications (HCC) 05/18/2014    Past Surgical History:  Procedure Laterality Date  . ABDOMINAL HYSTERECTOMY    . CERVICAL DISC SURGERY    . DILATION AND CURETTAGE OF UTERUS  1996  . HERNIA REPAIR  2000   Umblical  . HIP ARTHROPLASTY Right   . TOTAL HIP ARTHROPLASTY Left 05/18/2014   Procedure: LEFT TOTAL HIP ARTHROPLASTY ANTERIOR APPROACH;  Surgeon: Marcene Corning, MD;  Location: [facility] OR;  Service: Orthopedics;  Laterality: Left;     No current facility-administered medications for this encounter.   Current Outpatient Medications:  .  aspirin 81 MG tablet, Take 81 mg by mouth daily., Disp: , Rfl:  .  atorvastatin (LIPITOR) 40 MG tablet, TAKE 1 TABLET (40 MG TOTAL) BY MOUTH DAILY., Disp: 90 tablet, Rfl: 1 .  Cholecalciferol (VITAMIN D) 2000 units CAPS, Take 1 capsule (2,000 Units total) by mouth daily., Disp: 30 capsule, Rfl:  .  Dulaglutide (TRULICITY) 0.75 MG/0.5ML SOPN, Inject 0.75 mg into the skin once a week., Disp: 4 pen, Rfl: 5 .  DULoxetine (CYMBALTA) 60 MG capsule, TAKE 1 CAPSULE (60 MG TOTAL) BY MOUTH DAILY., Disp: 90 capsule, Rfl: 3 .  glucose blood test strip, 1 each by Other route daily. Use as instructed, Disp: , Rfl:  .  lansoprazole (PREVACID) 15 MG capsule, Take 15 mg by mouth daily at 12 noon., Disp: , Rfl:  .  losartan (COZAAR) 100 MG tablet, TAKE 1 TABLET (100 MG TOTAL) BY MOUTH DAILY., Disp: 90 tablet, Rfl: 3 .  metFORMIN (GLUCOPHAGE) 1000 MG tablet, TAKE 1 TABLET (1,000  MG TOTAL) BY MOUTH 2 (TWO) TIMES DAILY WITH A MEAL. (Patient taking differently: Take 500 mg by mouth 2 (two) times daily. ), Disp: 180 tablet, Rfl: 0 .  Multiple Vitamins-Minerals (MULTIVITAMIN WITH MINERALS) tablet, Take 1 tablet by mouth daily., Disp: , Rfl:  .  valACYclovir (VALTREX) 1000 MG tablet, Take 1 tablet (1,000 mg total) by mouth 2 (two) times daily., Disp: 20 tablet, Rfl: 0 .  meloxicam (MOBIC) 7.5 MG tablet, Take 1 tablet (7.5 mg total) by mouth daily., Disp: 14 tablet, Rfl: 0 .   metoprolol succinate (TOPROL-XL) 50 MG 24 hr tablet, Take 1 tablet (50 mg total) 2 (two) times daily by mouth. Take with or immediately following a meal., Disp: 180 tablet, Rfl: 2  Allergies Rabeprazole sodium; Augmentin [amoxicillin-pot clavulanate]; Benazepril hcl; Penicillins; and Jardiance [empagliflozin]  Family History  Problem Relation Age of Onset  . Hypertension Mother   . Stroke Mother   . Hyperlipidemia Mother   . Heart disease Mother        heart attack  . Diabetes Father   . Cancer Father        lung  . Diabetes Sister   . Hyperlipidemia Sister   . Stroke Paternal Grandfather   . Hypertension Paternal Grandfather   . Stroke Maternal Grandfather   . Alzheimer's disease Paternal Grandmother   . Diabetes Sister   . Cancer Sister        breast  . Breast cancer Sister [id number]  . Breast cancer Paternal Aunt     Social History Social History   Tobacco Use  . Smoking status: Former Smoker    Last attempt to quit: 06/14/1984    Years since quitting: 32.8  . Smokeless tobacco: Never Used  Substance Use Topics  . Alcohol use: No    Alcohol/week: 0.0 oz  . Drug use: No    Review of Systems Constitutional: No fever/chills Cardiovascular: Denies chest pain. Respiratory: Denies shortness of breath. Gastrointestinal: No abdominal pain.  Musculoskeletal: Negative for back pain. AS above.  Skin: Negative for rash.   ____________________________________________   PHYSICAL EXAM:  VITAL SIGNS: ED Triage Vitals  Enc Vitals Group     BP 04/29/17 0844 (!) 161/97     Pulse Rate 04/29/17 0844 70     Resp 04/29/17 0844 18     Temp 04/29/17 0844 98.3 F (36.8 C)     Temp Source 04/29/17 0844 Oral     SpO2 04/29/17 0844 100 %     Weight 04/29/17 0841 124 lb (56.2 kg)     Height 04/29/17 0841 5' (1.524 m)     Head Circumference --      Peak Flow --      Pain Score 04/29/17 0841 4     Pain Loc --      Pain Edu? --      Excl. in GC? --     Constitutional: Alert and  oriented. Well appearing and in no acute distress. Cardiovascular: Normal rate, regular rhythm. Grossly normal heart sounds.  Good peripheral circulation. Respiratory: Normal respiratory effort without tachypnea nor retractions. Breath sounds are clear and equal bilaterally. No wheezes, rales, rhonchi. Musculoskeletal: Steady gait. Bilateral pedal pulses equal and easily palpated.except: right medial knee mild localized swelling, no other edema noticed, mild pain along the medial collateral ligament, no bony tenderness, right leg otherwise nontender, no pain with anterior posterior drawer test, mild pain with medial stress, no pain with lateral stress, able to fully extend and flex.  Ambulatory with minimal antalgic gait.  Right knee appears stable. Neurologic:  Normal speech and language. Speech is normal. No gait instability.  Skin:  Skin is warm, dry. Psychiatric: Mood and affect are normal. Speech and behavior are normal. Patient exhibits appropriate insight and judgment.    ___________________________________________   LABS (all labs ordered are listed, but only abnormal results are displayed)  Labs Reviewed - No data to display PROCEDURES Procedures   INITIAL IMPRESSION / ASSESSMENT AND PLAN / ED COURSE  Pertinent labs & imaging results that were available during my care of the patient were reviewed by me and considered in my medical decision making (see chart for details).  Well appearing. No acute distress.  Suspect right medial knee strain.  As no bony tenderness and no direct trauma, will defer x-ray, patient agrees.  Will start on oral daily Mobic and supportive care.  Discussed getting over-the-counter knee sleeve for support, ice and elevation. Follow-up with orthopedic in 1 week if pain continues. Discussed indication, risks and benefits of medications with patient.   Discussed follow up with Primary care physician this week. Discussed follow up and return parameters including  no resolution or any worsening concerns. Patient verbalized understanding and agreed to plan.   ____________________________________________   FINAL CLINICAL IMPRESSION(S) / ED DIAGNOSES  Final diagnoses:  Knee strain, right, initial encounter     ED Discharge Orders        Ordered    meloxicam (MOBIC) 7.5 MG tablet  Daily     04/29/17 0928       Note: This dictation was prepared with Dragon dictation along with smaller phrase technology. Any transcriptional errors that result from this process are unintentional.         Renford Dills, NP 04/29/17 [phone]

## 2017-04-29 NOTE — ED Triage Notes (Signed)
Patient complains of right knee pain that started on Saturday pm. Patient denies any injury to the area. Describes pain as throbbing.

## 2017-04-29 NOTE — Discharge Instructions (Signed)
Take medication as prescribed. Rest. Drink plenty of fluids.  ° °Follow up with your primary care physician this week as needed. Return to Urgent care for new or worsening concerns.  ° °

## 2017-04-30 ENCOUNTER — Other Ambulatory Visit: Payer: Self-pay | Admitting: Internal Medicine

## 2017-04-30 DIAGNOSIS — IMO0002 Reserved for concepts with insufficient information to code with codable children: Secondary | ICD-10-CM

## 2017-04-30 DIAGNOSIS — E1165 Type 2 diabetes mellitus with hyperglycemia: Principal | ICD-10-CM

## 2017-04-30 DIAGNOSIS — E1129 Type 2 diabetes mellitus with other diabetic kidney complication: Secondary | ICD-10-CM

## 2017-05-06 ENCOUNTER — Other Ambulatory Visit: Payer: Self-pay | Admitting: Internal Medicine

## 2017-05-28 ENCOUNTER — Encounter: Payer: Self-pay | Admitting: Internal Medicine

## 2017-05-28 ENCOUNTER — Ambulatory Visit (INDEPENDENT_AMBULATORY_CARE_PROVIDER_SITE_OTHER): Payer: 59 | Admitting: Internal Medicine

## 2017-05-28 VITALS — BP 142/86 | HR 75 | Ht 60.0 in | Wt 127.0 lb

## 2017-05-28 DIAGNOSIS — E1169 Type 2 diabetes mellitus with other specified complication: Secondary | ICD-10-CM | POA: Diagnosis not present

## 2017-05-28 DIAGNOSIS — S86911A Strain of unspecified muscle(s) and tendon(s) at lower leg level, right leg, initial encounter: Secondary | ICD-10-CM

## 2017-05-28 DIAGNOSIS — E1165 Type 2 diabetes mellitus with hyperglycemia: Secondary | ICD-10-CM

## 2017-05-28 DIAGNOSIS — E1129 Type 2 diabetes mellitus with other diabetic kidney complication: Secondary | ICD-10-CM | POA: Diagnosis not present

## 2017-05-28 DIAGNOSIS — E559 Vitamin D deficiency, unspecified: Secondary | ICD-10-CM | POA: Diagnosis not present

## 2017-05-28 DIAGNOSIS — E782 Mixed hyperlipidemia: Secondary | ICD-10-CM

## 2017-05-28 DIAGNOSIS — B009 Herpesviral infection, unspecified: Secondary | ICD-10-CM | POA: Diagnosis not present

## 2017-05-28 DIAGNOSIS — Z0001 Encounter for general adult medical examination with abnormal findings: Secondary | ICD-10-CM

## 2017-05-28 DIAGNOSIS — IMO0002 Reserved for concepts with insufficient information to code with codable children: Secondary | ICD-10-CM

## 2017-05-28 DIAGNOSIS — Z0000 Encounter for general adult medical examination without abnormal findings: Secondary | ICD-10-CM

## 2017-05-28 DIAGNOSIS — I10 Essential (primary) hypertension: Secondary | ICD-10-CM

## 2017-05-28 LAB — POCT URINALYSIS DIPSTICK
Bilirubin, UA: NEGATIVE
Glucose, UA: NEGATIVE
Ketones, UA: NEGATIVE
LEUKOCYTES UA: NEGATIVE
NITRITE UA: NEGATIVE
PH UA: 6 (ref 5.0–8.0)
PROTEIN UA: 2000
RBC UA: NEGATIVE
Spec Grav, UA: 1.025 (ref 1.010–1.025)
UROBILINOGEN UA: 0.2 U/dL

## 2017-05-28 MED ORDER — VALACYCLOVIR HCL 1 G PO TABS: 1000.0000 mg | ORAL_TABLET | Freq: Two times a day (BID) | ORAL | 0 refills | Status: DC

## 2017-05-28 NOTE — Progress Notes (Signed)
Date:  05/28/2017   Name:  Tracy Bryan   DOB:  1960-10-12   MRN:  [id number]   Chief Complaint: Annual Exam (Breast Exam. Fasting for labs.) Tracy Bryan is a 57 y.o. female who presents today for her Complete Annual Exam. She feels well. She reports exercising walking some. She reports she is sleeping fairly well. Mammogram was done in Jan and was normal.  Colonoscopy was done in 2015.  Diabetes  She presents for her follow-up diabetic visit. She has type 2 diabetes mellitus. Pertinent negatives for hypoglycemia include no dizziness, headaches, nervousness/anxiousness or tremors. Pertinent negatives for diabetes include no chest pain, no fatigue, no polydipsia and no polyuria. Symptoms are stable. There are no diabetic complications. Current diabetic treatments: metformin and Trulicity. Compliance with diabetes treatment: able to stop Januvia. She is following a generally healthy diet. She has not had a previous visit with a dietitian. She monitors blood glucose at home 1-2 x per day. Her breakfast blood glucose is taken between 6-7 am. Her breakfast blood glucose range is generally 110-130 mg/dl. An ACE inhibitor/angiotensin II receptor blocker is being taken. Eye exam is not current.  Hypertension  Pertinent negatives include no chest pain, headaches, palpitations or shortness of breath. Risk factors for coronary artery disease include stress.  Hyperlipidemia  The problem is controlled. Pertinent negatives include no chest pain or shortness of breath. Current antihyperlipidemic treatment includes statins. The current treatment provides significant improvement of lipids.  Knee Pain   The incident occurred at home. The injury mechanism was a twisting injury. The pain is present in the right knee. The quality of the pain is described as aching. The pain is mild. The pain has been improving since onset. She has tried NSAIDs for the symptoms. The treatment provided significant (still  slightly sore but swelling much less) relief.     Review of Systems  Constitutional: Negative for chills, fatigue and fever.  HENT: Negative for congestion, hearing loss, tinnitus, trouble swallowing and voice change.   Eyes: Negative for visual disturbance.  Respiratory: Negative for cough, chest tightness, shortness of breath and wheezing.   Cardiovascular: Negative for chest pain, palpitations and leg swelling.  Gastrointestinal: Negative for abdominal pain, constipation, diarrhea and vomiting.  Endocrine: Negative for polydipsia and polyuria.  Genitourinary: Negative for dysuria, frequency, genital sores, vaginal bleeding and vaginal discharge.  Musculoskeletal: Positive for arthralgias (right knee intermittently). Negative for gait problem and joint swelling.  Skin: Negative for color change and rash.  Neurological: Negative for dizziness, tremors, light-headedness and headaches.  Hematological: Negative for adenopathy. Does not bruise/bleed easily.  Psychiatric/Behavioral: Negative for dysphoric mood and sleep disturbance. The patient is not nervous/anxious.     Patient Active Problem List   Diagnosis Date Noted  . Bilateral carpal tunnel syndrome 11/07/2016  . Herpes simplex infection 11/23/2015  . Fibromyalgia 03/11/2015  . Perimenopause 02/17/2015  . Esophageal reflux 02/17/2015  . Overweight (BMI 25.0-29.9) 02/17/2015  . Vitamin D deficiency 02/17/2015  . DM type 2 with diabetic mixed hyperlipidemia (HCC) 07/28/2014  . Essential hypertension 07/28/2014  . DM (diabetes mellitus), type 2, uncontrolled, with renal complications (HCC) 05/18/2014    Prior to Admission medications   Medication Sig Start Date End Date Taking? Authorizing Provider  aspirin 81 MG tablet Take 81 mg by mouth daily.   Yes [provider]  atorvastatin (LIPITOR) 40 MG tablet TAKE 1 TABLET (40 MG TOTAL) BY MOUTH DAILY. 05/06/17  Yes Reubin Milan, MD  Cholecalciferol (VITAMIN D) 2000  units CAPS Take 1 capsule (2,000 Units total) by mouth daily. 02/17/15  Yes Plonk, Chrissie NoaWilliam, MD  DULoxetine (CYMBALTA) 60 MG capsule TAKE 1 CAPSULE (60 MG TOTAL) BY MOUTH DAILY. 05/06/17  Yes Reubin MilanBerglund, Jaeden Messer H, MD  glucose blood test strip 1 each by Other route daily. Use as instructed   Yes [provider]  lansoprazole (PREVACID) 15 MG capsule Take 15 mg by mouth daily at 12 noon.   Yes [provider]  losartan (COZAAR) 100 MG tablet TAKE 1 TABLET (100 MG TOTAL) BY MOUTH DAILY. 03/15/17  Yes Reubin MilanBerglund, Laila Myhre H, MD  meloxicam (MOBIC) 7.5 MG tablet Take 1 tablet (7.5 mg total) by mouth daily. 04/29/17  Yes Renford DillsMiller, Lindsey, NP  metFORMIN (GLUCOPHAGE) 1000 MG tablet TAKE 1 TABLET (1,000 MG TOTAL) BY MOUTH 2 (TWO) TIMES DAILY WITH A MEAL. Patient taking differently: Take 500 mg by mouth 2 (two) times daily.  07/02/16  Yes Reubin MilanBerglund, Curry Dulski H, MD  metoprolol succinate (TOPROL-XL) 50 MG 24 hr tablet Take 1 tablet (50 mg total) 2 (two) times daily by mouth. Take with or immediately following a meal. 12/22/16  Yes Reubin MilanBerglund, Nicholette Dolson H, MD  Multiple Vitamins-Minerals (MULTIVITAMIN WITH MINERALS) tablet Take 1 tablet by mouth daily.   Yes [provider]  TRULICITY 0.75 MG/0.5ML SOPN INJECT 0.75 MG INTO THE SKIN ONCE A WEEK. 04/30/17  Yes Reubin MilanBerglund, Zakkary Thibault H, MD  valACYclovir (VALTREX) 1000 MG tablet Take 1 tablet (1,000 mg total) by mouth 2 (two) times daily. 11/23/15  Yes Reubin MilanBerglund, Maizie Garno H, MD    Allergies  Allergen Reactions  . Rabeprazole Sodium     C Diff Side Affects  . Augmentin [Amoxicillin-Pot Clavulanate] Itching  . Benazepril Hcl Cough  . Penicillins Other (See Comments)  . Jardiance [Empagliflozin] Rash    Recurrent yeast vaginitis    Past Surgical History:  Procedure Laterality Date  . ABDOMINAL HYSTERECTOMY    . CERVICAL DISC SURGERY    . DILATION AND CURETTAGE OF UTERUS  1996  . HERNIA REPAIR  2000   Umblical  . HIP ARTHROPLASTY Right   . TOTAL HIP ARTHROPLASTY Left  05/18/2014   Procedure: LEFT TOTAL HIP ARTHROPLASTY ANTERIOR APPROACH;  Surgeon: Marcene CorningPeter Dalldorf, MD;  Location: [facility] OR;  Service: Orthopedics;  Laterality: Left;    Social History   Tobacco Use  . Smoking status: Former Smoker    Last attempt to quit: 06/14/1984    Years since quitting: 32.9  . Smokeless tobacco: Never Used  Substance Use Topics  . Alcohol use: No    Alcohol/week: 0.0 oz  . Drug use: No     Medication list has been reviewed and updated.  PHQ 2/9 Scores 08/01/2016 04/12/2016 03/19/2016 11/23/2015  PHQ - 2 Score 0 0 0 0    Physical Exam  Constitutional: She is oriented to person, place, and time. She appears well-developed and well-nourished. No distress.  HENT:  Head: Normocephalic and atraumatic.  Right Ear: Tympanic membrane and ear canal normal.  Left Ear: Tympanic membrane and ear canal normal.  Nose: Right sinus exhibits no maxillary sinus tenderness. Left sinus exhibits no maxillary sinus tenderness.  Mouth/Throat: Uvula is midline and oropharynx is clear and moist.  Eyes: Conjunctivae and EOM are normal. Right eye exhibits no discharge. Left eye exhibits no discharge. No scleral icterus.  Neck: Normal range of motion. Carotid bruit is not present. No erythema present. No thyromegaly present.  Cardiovascular: Normal rate, regular rhythm, normal heart sounds and  normal pulses.  Pulmonary/Chest: Effort normal. No respiratory distress. She has no wheezes. Right breast exhibits no mass, no nipple discharge, no skin change and no tenderness. Left breast exhibits no mass, no nipple discharge, no skin change and no tenderness.  Abdominal: Soft. Bowel sounds are normal. There is no hepatosplenomegaly. There is no tenderness. There is no CVA tenderness.  Musculoskeletal: Normal range of motion.       Right knee: She exhibits effusion (small). She exhibits normal range of motion. Tenderness found. Medial joint line tenderness noted.  Lymphadenopathy:    She has no cervical  adenopathy.    She has no axillary adenopathy.  Neurological: She is alert and oriented to person, place, and time. She has normal reflexes. No cranial nerve deficit or sensory deficit.  Skin: Skin is warm, dry and intact. No rash noted.  Psychiatric: She has a normal mood and affect. Her speech is normal and behavior is normal. Thought content normal.  Nursing note and vitals reviewed.   BP (!) 142/86   Pulse 75   Ht 5' (1.524 m)   Wt 127 lb (57.6 kg)   LMP  (LMP Unknown)   SpO2 100%   BMI 24.80 kg/m   Assessment and Plan: 1. Annual physical exam Continue exercise and healthy diet - POCT urinalysis dipstick  2. Herpes simplex infection - valACYclovir (VALTREX) 1000 MG tablet; Take 1 tablet (1,000 mg total) by mouth 2 (two) times daily.  Dispense: 20 tablet; Refill: 0  3. Essential hypertension controlled - CBC with Differential/Platelet - TSH  4. DM (diabetes mellitus), type 2, uncontrolled, with renal complications (HCC) Continue metformin and Trulicity - Comprehensive metabolic panel - Hemoglobin A1c  5. DM type 2 with diabetic mixed hyperlipidemia (HCC) On statin and aspirin therapy - Lipid panel  6. Vitamin D deficiency Continue supplementation - VITAMIN D 25 Hydroxy (Vit-D Deficiency, Fractures)  7. Knee strain, right, initial encounter Continue Aleve or Advil as needed   Meds ordered this encounter  Medications  . valACYclovir (VALTREX) 1000 MG tablet    Sig: Take 1 tablet (1,000 mg total) by mouth 2 (two) times daily.    Dispense:  20 tablet    Refill:  0    Partially dictated using Animal nutritionist. Any errors are unintentional.  Bari Edward, MD [facility]  05/28/2017

## 2017-05-28 NOTE — Patient Instructions (Addendum)
Take Aleve or Advil as needed for knee pain.  Breast Self-Awareness Breast self-awareness means being familiar with how your breasts look and feel. It involves checking your breasts regularly and reporting any changes to your health care provider. Practicing breast self-awareness is important. A change in your breasts can be a sign of a serious medical problem. Being familiar with how your breasts look and feel allows you to find any problems early, when treatment is more likely to be successful. All women should practice breast self-awareness, including women who have had breast implants. How to do a breast self-exam One way to learn what is normal for your breasts and whether your breasts are changing is to do a breast self-exam. To do a breast self-exam: Look for Changes  1. Remove all the clothing above your waist. 2. Stand in front of a mirror in a room with good lighting. 3. Put your hands on your hips. 4. Push your hands firmly downward. 5. Compare your breasts in the mirror. Look for differences between them (asymmetry), such as: ? Differences in shape. ? Differences in size. ? Puckers, dips, and bumps in one breast and not the other. 6. Look at each breast for changes in your skin, such as: ? Redness. ? Scaly areas. 7. Look for changes in your nipples, such as: ? Discharge. ? Bleeding. ? Dimpling. ? Redness. ? A change in position. Feel for Changes  Carefully feel your breasts for lumps and changes. It is best to do this while lying on your back on the floor and again while sitting or standing in the shower or tub with soapy water on your skin. Feel each breast in the following way:  Place the arm on the side of the breast you are examining above your head.  Feel your breast with the other hand.  Start in the nipple area and make  inch (2 cm) overlapping circles to feel your breast. Use the pads of your three middle fingers to do this. Apply light pressure, then medium  pressure, then firm pressure. The light pressure will allow you to feel the tissue closest to the skin. The medium pressure will allow you to feel the tissue that is a little deeper. The firm pressure will allow you to feel the tissue close to the ribs.  Continue the overlapping circles, moving downward over the breast until you feel your ribs below your breast.  Move one finger-width toward the center of the body. Continue to use the  inch (2 cm) overlapping circles to feel your breast as you move slowly up toward your collarbone.  Continue the up and down exam using all three pressures until you reach your armpit.  Write Down What You Find  Write down what is normal for each breast and any changes that you find. Keep a written record with breast changes or normal findings for each breast. By writing this information down, you do not need to depend only on memory for size, tenderness, or location. Write down where you are in your menstrual cycle, if you are still menstruating. If you are having trouble noticing differences in your breasts, do not get discouraged. With time you will become more familiar with the variations in your breasts and more comfortable with the exam. How often should I examine my breasts? Examine your breasts every month. If you are breastfeeding, the best time to examine your breasts is after a feeding or after using a breast pump. If you menstruate, the best  time to examine your breasts is 5-7 days after your period is over. During your period, your breasts are lumpier, and it may be more difficult to notice changes. When should I see my health care provider? See your health care provider if you notice:  A change in shape or size of your breasts or nipples.  A change in the skin of your breast or nipples, such as a reddened or scaly area.  Unusual discharge from your nipples.  A lump or thick area that was not there before.  Pain in your breasts.  Anything that  concerns you.  This information is not intended to replace advice given to you by your health care provider. Make sure you discuss any questions you have with your health care provider. Document Released: 01/29/2005 Document Revised: 07/07/2015 Document Reviewed: 12/19/2014 Elsevier Interactive Patient Education  [employer].

## 2017-05-29 LAB — COMPREHENSIVE METABOLIC PANEL
A/G RATIO: 1.7 (ref 1.2–2.2)
ALBUMIN: 4.6 g/dL (ref 3.5–5.5)
ALT: 37 IU/L — ABNORMAL HIGH (ref 0–32)
AST: 28 IU/L (ref 0–40)
Alkaline Phosphatase: 72 IU/L (ref 39–117)
BILIRUBIN TOTAL: 0.3 mg/dL (ref 0.0–1.2)
BUN / CREAT RATIO: 17 (ref 9–23)
BUN: 15 mg/dL (ref 6–24)
CHLORIDE: 99 mmol/L (ref 96–106)
CO2: 27 mmol/L (ref 20–29)
Calcium: 10 mg/dL (ref 8.7–10.2)
Creatinine, Ser: 0.86 mg/dL (ref 0.57–1.00)
GFR calc non Af Amer: 74 mL/min/{1.73_m2} (ref >59)
GFR, EST AFRICAN AMERICAN: 86 mL/min/{1.73_m2} (ref >59)
GLOBULIN, TOTAL: 2.7 g/dL (ref 1.5–4.5)
Glucose: 140 mg/dL — ABNORMAL HIGH (ref 65–99)
POTASSIUM: 5.5 mmol/L — AB (ref 3.5–5.2)
Sodium: 141 mmol/L (ref 134–144)
TOTAL PROTEIN: 7.3 g/dL (ref 6.0–8.5)

## 2017-05-29 LAB — CBC WITH DIFFERENTIAL/PLATELET
BASOS: 2 %
Basophils Absolute: 0.1 10*3/uL (ref 0.0–0.2)
EOS (ABSOLUTE): 0.2 10*3/uL (ref 0.0–0.4)
Eos: 3 %
HEMOGLOBIN: 14.1 g/dL (ref 11.1–15.9)
Hematocrit: 43.5 % (ref 34.0–46.6)
Immature Grans (Abs): 0 10*3/uL (ref 0.0–0.1)
Immature Granulocytes: 1 %
Lymphocytes Absolute: 2.7 10*3/uL (ref 0.7–3.1)
Lymphs: 41 %
MCH: 28.6 pg (ref 26.6–33.0)
MCHC: 32.4 g/dL (ref 31.5–35.7)
MCV: 88 fL (ref 79–97)
MONOCYTES: 8 %
Monocytes Absolute: 0.5 10*3/uL (ref 0.1–0.9)
NEUTROS ABS: 3 10*3/uL (ref 1.4–7.0)
Neutrophils: 45 %
Platelets: 284 10*3/uL (ref 150–379)
RBC: 4.93 x10E6/uL (ref 3.77–5.28)
RDW: 14.2 % (ref 12.3–15.4)
WBC: 6.5 10*3/uL (ref 3.4–10.8)

## 2017-05-29 LAB — LIPID PANEL
CHOL/HDL RATIO: 4.2 ratio (ref 0.0–4.4)
Cholesterol, Total: 204 mg/dL — ABNORMAL HIGH (ref 100–199)
HDL: 49 mg/dL (ref >39)
TRIGLYCERIDES: 469 mg/dL — AB (ref 0–149)

## 2017-05-29 LAB — HEMOGLOBIN A1C
Est. average glucose Bld gHb Est-mCnc: 148 mg/dL
Hgb A1c MFr Bld: 6.8 % — ABNORMAL HIGH (ref 4.8–5.6)

## 2017-05-29 LAB — VITAMIN D 25 HYDROXY (VIT D DEFICIENCY, FRACTURES): VIT D 25 HYDROXY: 52.8 ng/mL (ref 30.0–100.0)

## 2017-05-29 LAB — TSH: TSH: 1.27 u[IU]/mL (ref 0.450–4.500)

## 2017-07-30 ENCOUNTER — Telehealth: Payer: Self-pay

## 2017-07-30 MED ORDER — METFORMIN HCL 1000 MG PO TABS: 1000.0000 mg | ORAL_TABLET | Freq: Two times a day (BID) | ORAL | 3 refills | Status: DC

## 2017-07-30 NOTE — Telephone Encounter (Signed)
Patient called regarding Trulicity and metformin XR. Patient stated on message pharmacy told her to switch to metformin XR. Contacted [facility] they explain to me that she is out of her regular metformin and just needed refills, and patient already had picked up her Rx of Trulicity recently and paid a $25.00 co pay.

## 2017-07-31 NOTE — Telephone Encounter (Signed)
Patient states that it was insurance who didn't want her to take Trulicity. We need to contact insurance to verify why she is not taking.

## 2017-07-31 NOTE — Telephone Encounter (Signed)
Spoke with [employer] insurance and they aren't sure what we are talking about and for patient to contact member service. Advise patient what they stated and she said she has paperwork they sent her stating coverage on Trulicity.

## 2017-08-16 ENCOUNTER — Encounter: Payer: Self-pay | Admitting: Gynecology

## 2017-08-16 ENCOUNTER — Other Ambulatory Visit: Payer: Self-pay

## 2017-08-16 ENCOUNTER — Ambulatory Visit
Admission: EM | Admit: 2017-08-16 | Discharge: 2017-08-16 | Disposition: A | Discharge status: Home / Self Care | Payer: Medicare Other | Attending: Family Medicine | Admitting: Family Medicine

## 2017-08-16 ENCOUNTER — Ambulatory Visit (INDEPENDENT_AMBULATORY_CARE_PROVIDER_SITE_OTHER): Payer: Medicare Other

## 2017-08-16 DIAGNOSIS — S90122A Contusion of left lesser toe(s) without damage to nail, initial encounter: Secondary | ICD-10-CM | POA: Diagnosis not present

## 2017-08-16 MED ORDER — HYDROCODONE-ACETAMINOPHEN 5-325 MG PO TABS: 1.0000 | ORAL_TABLET | Freq: Three times a day (TID) | ORAL | 0 refills | Status: DC | PRN

## 2017-08-16 NOTE — ED Triage Notes (Signed)
Patient c/o heavy bucket fell and hit her on the top of her left foot. Patient present today with the top of her left foot swollen / bruise and not able to bear weight on her left foot.

## 2017-08-16 NOTE — ED Provider Notes (Signed)
[facility]    CSN: [id number] Arrival date & time: 08/16/17  1656  History   Chief Complaint Chief Complaint  Patient presents with  . Foot Injury   HPI  57 year old female presents with a foot injury.  Patient reports that around 4:00 she inadvertently dropped a heavy bucket on the top of her left foot.  The area immediately bruised.  Reports significant swelling.  Severe pain.  Inability to ambulate due to pain.  She is currently icing the area.  No medications or other interventions tried.  She is concerned about fracture.  No ankle pain.  No other associated symptoms.  No other complaints.  Past Medical History:  Diagnosis Date  . Arthritis   . Diabetes mellitus without complication (HCC)    Type 2  . Disseminated intravascular coagulation (HCC) 1999   After child birth  . GERD (gastroesophageal reflux disease)   . Herpes simplex   . Hypercholesteremia   . Hypertension   . Migraines   . Pneumonia   . PONV (postoperative nausea and vomiting)    Patient Active Problem List   Diagnosis Date Noted  . Bilateral carpal tunnel syndrome 11/07/2016  . Herpes simplex infection 11/23/2015  . Fibromyalgia 03/11/2015  . Perimenopause 02/17/2015  . Esophageal reflux 02/17/2015  . Overweight (BMI 25.0-29.9) 02/17/2015  . Vitamin D deficiency 02/17/2015  . DM type 2 with diabetic mixed hyperlipidemia (HCC) 07/28/2014  . Essential hypertension 07/28/2014  . DM (diabetes mellitus), type 2, uncontrolled, with renal complications (HCC) 05/18/2014   Past Surgical History:  Procedure Laterality Date  . ABDOMINAL HYSTERECTOMY    . CERVICAL DISC SURGERY    . COLONOSCOPY  07/22/2013  . DILATION AND CURETTAGE OF UTERUS  1996  . HERNIA REPAIR  2000   Umblical  . HIP ARTHROPLASTY Right   . TOTAL HIP ARTHROPLASTY Left 05/18/2014   Procedure: LEFT TOTAL HIP ARTHROPLASTY ANTERIOR APPROACH;  Surgeon: Marcene CorningPeter Dalldorf, MD;  Location: [facility] OR;  Service: Orthopedics;  Laterality: Left;    OB History   None    Home Medications    Prior to Admission medications   Medication Sig Start Date End Date Taking? Authorizing Provider  aspirin 81 MG tablet Take 81 mg by mouth daily.   Yes [provider]  atorvastatin (LIPITOR) 40 MG tablet TAKE 1 TABLET (40 MG TOTAL) BY MOUTH DAILY. 05/06/17  Yes Reubin MilanBerglund, Laura H, MD  Cholecalciferol (VITAMIN D) 2000 units CAPS Take 1 capsule (2,000 Units total) by mouth daily. 02/17/15  Yes Plonk, Chrissie NoaWilliam, MD  DULoxetine (CYMBALTA) 60 MG capsule TAKE 1 CAPSULE (60 MG TOTAL) BY MOUTH DAILY. 05/06/17  Yes Reubin MilanBerglund, Laura H, MD  glucose blood test strip 1 each by Other route daily. Use as instructed   Yes [provider]  lansoprazole (PREVACID) 15 MG capsule Take 15 mg by mouth daily at 12 noon.   Yes [provider]  losartan (COZAAR) 100 MG tablet TAKE 1 TABLET (100 MG TOTAL) BY MOUTH DAILY. 03/15/17  Yes Reubin MilanBerglund, Laura H, MD  metFORMIN (GLUCOPHAGE) 1000 MG tablet Take 1 tablet (1,000 mg total) by mouth 2 (two) times daily with a meal. 07/30/17  Yes Reubin MilanBerglund, Laura H, MD  metoprolol succinate (TOPROL-XL) 50 MG 24 hr tablet Take 1 tablet (50 mg total) 2 (two) times daily by mouth. Take with or immediately following a meal. 12/22/16  Yes Reubin MilanBerglund, Laura H, MD  Multiple Vitamins-Minerals (MULTIVITAMIN WITH MINERALS) tablet Take 1 tablet by mouth daily.  Yes [provider]  TRULICITY 0.75 MG/0.5ML SOPN INJECT 0.75 MG INTO THE SKIN ONCE A WEEK. 04/30/17  Yes Reubin Milan, MD  valACYclovir (VALTREX) 1000 MG tablet Take 1 tablet (1,000 mg total) by mouth 2 (two) times daily. 05/28/17  Yes Reubin Milan, MD  HYDROcodone-acetaminophen (NORCO/VICODIN) 5-325 MG tablet Take 1 tablet by mouth every 8 (eight) hours as needed for moderate pain. 08/16/17   Tommie Sams, DO   Family History Family History  Problem Relation Age of Onset  . Hypertension Mother   . Stroke Mother   . Hyperlipidemia Mother   . Heart disease  Mother        heart attack  . Diabetes Father   . Cancer Father        lung  . Diabetes Sister   . Hyperlipidemia Sister   . Stroke Paternal Grandfather   . Hypertension Paternal Grandfather   . Stroke Maternal Grandfather   . Alzheimer's disease Paternal Grandmother   . Diabetes Sister   . Cancer Sister        breast  . Breast cancer Sister 67  . Breast cancer Paternal Aunt    Social History Social History   Tobacco Use  . Smoking status: Former Smoker    Last attempt to quit: 06/14/1984    Years since quitting: 33.1  . Smokeless tobacco: Never Used  Substance Use Topics  . Alcohol use: No    Alcohol/week: 0.0 oz  . Drug use: No   Allergies   Rabeprazole sodium; Augmentin [amoxicillin-pot clavulanate]; Benazepril hcl; Penicillins; and Jardiance [empagliflozin]   Review of Systems Review of Systems  Constitutional: Negative.   Musculoskeletal:       Left foot pain, bruising, swelling.   Physical Exam Triage Vital Signs ED Triage Vitals  Enc Vitals Group     BP 08/16/17 1719 (!) 174/100     Pulse Rate 08/16/17 1719 100     Resp 08/16/17 1719 16     Temp 08/16/17 1719 98.3 F (36.8 C)     Temp Source 08/16/17 1719 Oral     SpO2 08/16/17 1719 99 %     Weight 08/16/17 1716 128 lb (58.1 kg)     Height 08/16/17 1716 5' (1.524 m)     Head Circumference --      Peak Flow --      Pain Score 08/16/17 1716 8     Pain Loc --      Pain Edu? --      Excl. in GC? --    Updated Vital Signs BP (!) 174/100 (BP Location: Left Arm)   Pulse 100   Temp 98.3 F (36.8 C) (Oral)   Resp 16   Ht 5' (1.524 m)   Wt 128 lb (58.1 kg)   LMP  (LMP Unknown)   SpO2 99%   BMI 25.00 kg/m   Visual Acuity Right Eye Distance:   Left Eye Distance:   Bilateral Distance:    Right Eye Near:   Left Eye Near:    Bilateral Near:     Physical Exam  Constitutional: She is oriented to person, place, and time. She appears well-developed. No distress.  HENT:  Head: Normocephalic and  atraumatic.  Pulmonary/Chest: Effort normal. No respiratory distress.  Musculoskeletal:  Left foot -bruising and swelling noted.  Patient has an obvious hematoma on the dorsum of her foot.  Exam limited due to pain.    Neurological: She is alert and oriented to  person, place, and time.  Psychiatric: She has a normal mood and affect. Her behavior is normal.  Nursing note and vitals reviewed.  UC Treatments / Results  Labs (all labs ordered are listed, but only abnormal results are displayed) Labs Reviewed - No data to display  EKG None  Radiology Dg Foot Complete Left  Result Date: 08/16/2017 CLINICAL DATA:  Injury to LEFT foot, visible swelling and bruising metatarsal area. EXAM: LEFT FOOT - COMPLETE 3+ VIEW COMPARISON:  None. FINDINGS: Osseous alignment is normal. No fracture line or displaced fracture fragment seen. Large soft tissue edema/hematoma overlying the dorsum of the metatarsal bones. IMPRESSION: 1. Large soft tissue edema/hematoma overlying the dorsum of the metatarsal bones. 2. No underlying fracture or dislocation seen. Electronically Signed   By: Bary Richard M.D.   On: 08/16/2017 17:46    Procedures Procedures (including critical care time)  Medications Ordered in UC Medications - No data to display  Initial Impression / Assessment and Plan / UC Course  I have reviewed the triage vital signs and the nursing notes.  Pertinent labs & imaging results that were available during my care of the patient were reviewed by me and considered in my medical decision making (see chart for details).    57 year old female presents with a hematoma of the left foot.  X-rays negative.  Advised rest, ice, elevation.  Crutches given to aid in ambulation.  Small amount of pain medication given due to injury and severity of pain. [place] controlled substance database was reviewed. No concerns.  Final Clinical Impressions(s) / UC Diagnoses   Final diagnoses:  Hematoma of toe of left foot,  initial encounter     Discharge Instructions     Rest, ice, elevate.  Pain medication as needed.  Take care  Dr. Adriana Simas     ED Prescriptions    Medication Sig Dispense Auth. Provider   HYDROcodone-acetaminophen (NORCO/VICODIN) 5-325 MG tablet Take 1 tablet by mouth every 8 (eight) hours as needed for moderate pain. 10 tablet Tommie Sams, DO     Controlled Substance Prescriptions [place] Controlled Substance Registry consulted? Yes, I have consulted the  Controlled Substances Registry for this patient, and feel the risk/benefit ratio today is favorable for proceeding with this prescription for a controlled substance.   Tommie Sams, [place] 08/16/17 1818

## 2017-08-16 NOTE — Discharge Instructions (Signed)
Rest, ice, elevate.  Pain medication as needed.  Take care  Dr. Adriana Simasook

## 2017-09-04 ENCOUNTER — Ambulatory Visit: Payer: 59 | Admitting: Internal Medicine

## 2017-09-04 ENCOUNTER — Encounter: Payer: Self-pay | Admitting: Internal Medicine

## 2017-09-04 VITALS — BP 120/78 | HR 72 | Ht 60.0 in | Wt 124.0 lb

## 2017-09-04 DIAGNOSIS — M25475 Effusion, left foot: Secondary | ICD-10-CM | POA: Diagnosis not present

## 2017-09-04 NOTE — Progress Notes (Signed)
Date:  09/04/2017   Name:  Tracy Bryan   DOB:  04-30-1960   MRN:  [id number]   Chief Complaint: Foot Pain (X 2 weeks- Seen UC when dropped a bucket on Left foot. Had xray and was not broken. [place] UC [place] beach [place] and was given mupirocin ointment and sulfa antibiotics. ) Since the incident, the swelling is much improved,  She has some discomfort but is able to walk and flex her foot without difficulty.  She was at the beach last week and the top of her foot became red and warm.  She was treated by UC with Bactrim.  She has completed the course and foot now appears normal except for swelling.   Review of Systems  Constitutional: Negative for chills, fatigue and fever.  Respiratory: Negative for chest tightness, shortness of breath and wheezing.   Cardiovascular: Negative for chest pain and palpitations.  Skin: Positive for color change (and swelling on top of left foot).    Patient Active Problem List   Diagnosis Date Noted  . Bilateral carpal tunnel syndrome 11/07/2016  . Herpes simplex infection 11/23/2015  . Fibromyalgia 03/11/2015  . Perimenopause 02/17/2015  . Esophageal reflux 02/17/2015  . Overweight (BMI 25.0-29.9) 02/17/2015  . Vitamin D deficiency 02/17/2015  . DM type 2 with diabetic mixed hyperlipidemia (HCC) 07/28/2014  . Essential hypertension 07/28/2014  . DM (diabetes mellitus), type 2, uncontrolled, with renal complications (HCC) 05/18/2014    Prior to Admission medications   Medication Sig Start Date End Date Taking? Authorizing Provider  aspirin 81 MG tablet Take 81 mg by mouth daily.   Yes [provider]  atorvastatin (LIPITOR) 40 MG tablet TAKE 1 TABLET (40 MG TOTAL) BY MOUTH DAILY. 05/06/17  Yes Reubin MilanBerglund, Lonny Eisen H, MD  Cholecalciferol (VITAMIN D) 2000 units CAPS Take 1 capsule (2,000 Units total) by mouth daily. 02/17/15  Yes Plonk, Chrissie NoaWilliam, MD  DULoxetine (CYMBALTA) 60 MG capsule TAKE 1 CAPSULE (60 MG TOTAL) BY MOUTH DAILY. 05/06/17  Yes  Reubin MilanBerglund, Ivy Meriwether H, MD  glucose blood test strip 1 each by Other route daily. Use as instructed   Yes [provider]  HYDROcodone-acetaminophen (NORCO/VICODIN) 5-325 MG tablet Take 1 tablet by mouth every 8 (eight) hours as needed for moderate pain. 08/16/17  Yes Cook, Jayce G, DO  lansoprazole (PREVACID) 15 MG capsule Take 15 mg by mouth daily at 12 noon.   Yes [provider]  losartan (COZAAR) 100 MG tablet TAKE 1 TABLET (100 MG TOTAL) BY MOUTH DAILY. 03/15/17  Yes Reubin MilanBerglund, Mervyn Pflaum H, MD  metFORMIN (GLUCOPHAGE) 1000 MG tablet Take 1 tablet (1,000 mg total) by mouth 2 (two) times daily with a meal. 07/30/17  Yes Reubin MilanBerglund, Andra Matsuo H, MD  metoprolol succinate (TOPROL-XL) 50 MG 24 hr tablet Take 1 tablet (50 mg total) 2 (two) times daily by mouth. Take with or immediately following a meal. 12/22/16  Yes Reubin MilanBerglund, Jamye Balicki H, MD  Multiple Vitamins-Minerals (MULTIVITAMIN WITH MINERALS) tablet Take 1 tablet by mouth daily.   Yes [provider]  TRULICITY 0.75 MG/0.5ML SOPN INJECT 0.75 MG INTO THE SKIN ONCE A WEEK. 04/30/17  Yes Reubin MilanBerglund, Yaffa Seckman H, MD  valACYclovir (VALTREX) 1000 MG tablet Take 1 tablet (1,000 mg total) by mouth 2 (two) times daily. 05/28/17  Yes Reubin MilanBerglund, Swayze Pries H, MD    Allergies  Allergen Reactions  . Rabeprazole Sodium     C Diff Side Affects  . Augmentin [Amoxicillin-Pot Clavulanate] Itching  . Benazepril Hcl  Cough  . Penicillins Other (See Comments)  . Jardiance [Empagliflozin] Rash    Recurrent yeast vaginitis    Past Surgical History:  Procedure Laterality Date  . ABDOMINAL HYSTERECTOMY    . CERVICAL DISC SURGERY    . COLONOSCOPY  07/22/2013  . DILATION AND CURETTAGE OF UTERUS  1996  . HERNIA REPAIR  2000   Umblical  . HIP ARTHROPLASTY Right   . TOTAL HIP ARTHROPLASTY Left 05/18/2014   Procedure: LEFT TOTAL HIP ARTHROPLASTY ANTERIOR APPROACH;  Surgeon: Marcene Corning, MD;  Location: [facility];  Service: Orthopedics;  Laterality: Left;    Social History     Tobacco Use  . Smoking status: Former Smoker    Last attempt to quit: 06/14/1984    Years since quitting: 33.2  . Smokeless tobacco: Never Used  Substance Use Topics  . Alcohol use: No    Alcohol/week: 0.0 oz  . Drug use: No     Medication list has been reviewed and updated.  Current Meds  Medication Sig  . aspirin 81 MG tablet Take 81 mg by mouth daily.  Marland Kitchen atorvastatin (LIPITOR) 40 MG tablet TAKE 1 TABLET (40 MG TOTAL) BY MOUTH DAILY.  Marland Kitchen Cholecalciferol (VITAMIN D) 2000 units CAPS Take 1 capsule (2,000 Units total) by mouth daily.  . DULoxetine (CYMBALTA) 60 MG capsule TAKE 1 CAPSULE (60 MG TOTAL) BY MOUTH DAILY.  Marland Kitchen glucose blood test strip 1 each by Other route daily. Use as instructed  . HYDROcodone-acetaminophen (NORCO/VICODIN) 5-325 MG tablet Take 1 tablet by mouth every 8 (eight) hours as needed for moderate pain.  Marland Kitchen lansoprazole (PREVACID) 15 MG capsule Take 15 mg by mouth daily at 12 noon.  Marland Kitchen losartan (COZAAR) 100 MG tablet TAKE 1 TABLET (100 MG TOTAL) BY MOUTH DAILY.  . metFORMIN (GLUCOPHAGE) 1000 MG tablet Take 1 tablet (1,000 mg total) by mouth 2 (two) times daily with a meal.  . metoprolol succinate (TOPROL-XL) 50 MG 24 hr tablet Take 1 tablet (50 mg total) 2 (two) times daily by mouth. Take with or immediately following a meal.  . Multiple Vitamins-Minerals (MULTIVITAMIN WITH MINERALS) tablet Take 1 tablet by mouth daily.  . TRULICITY 0.75 MG/0.5ML SOPN INJECT 0.75 MG INTO THE SKIN ONCE A WEEK.  . valACYclovir (VALTREX) 1000 MG tablet Take 1 tablet (1,000 mg total) by mouth 2 (two) times daily.    PHQ 2/9 Scores 09/04/2017 08/01/2016 04/12/2016 03/19/2016  PHQ - 2 Score 0 0 0 0    Physical Exam  Constitutional: She is oriented to person, place, and time. She appears well-developed. No distress.  HENT:  Head: Normocephalic and atraumatic.  Pulmonary/Chest: Effort normal. No respiratory distress.  Musculoskeletal: Normal range of motion.       Feet:  Neurological:  She is alert and oriented to person, place, and time.  Skin: Skin is warm and dry. No rash noted.  Psychiatric: She has a normal mood and affect. Her behavior is normal. Thought content normal.  Nursing note and vitals reviewed.   BP 120/78   Pulse 72   Ht 5' (1.524 m)   Wt 124 lb (56.2 kg)   LMP  (LMP Unknown)   SpO2 98%   BMI 24.22 kg/m   Assessment and Plan: 1. Swelling of foot joint, left Due to hematoma Pt reassured, it should resolve on its own Elevate and use heat if helpful   No orders of the defined types were placed in this encounter.   Partially dictated using Animal nutritionist. Any  errors are unintentional.  Bari Edward, MD [facility]  09/04/2017   There are no diagnoses linked to this encounter.

## 2017-09-14 ENCOUNTER — Encounter: Payer: Self-pay | Admitting: Gynecology

## 2017-09-14 ENCOUNTER — Ambulatory Visit
Admission: EM | Admit: 2017-09-14 | Discharge: 2017-09-14 | Disposition: A | Discharge status: Home / Self Care | Payer: 59 | Attending: Family Medicine | Admitting: Family Medicine

## 2017-09-14 DIAGNOSIS — J020 Streptococcal pharyngitis: Secondary | ICD-10-CM | POA: Diagnosis not present

## 2017-09-14 DIAGNOSIS — R51 Headache: Secondary | ICD-10-CM

## 2017-09-14 DIAGNOSIS — R509 Fever, unspecified: Secondary | ICD-10-CM

## 2017-09-14 LAB — RAPID STREP SCREEN (MED CTR MEBANE ONLY): Streptococcus, Group A Screen (Direct): POSITIVE — AB

## 2017-09-14 MED ORDER — CLINDAMYCIN HCL 300 MG PO CAPS: 300.0000 mg | ORAL_CAPSULE | Freq: Three times a day (TID) | ORAL | 0 refills | Status: DC

## 2017-09-14 NOTE — Discharge Instructions (Addendum)
Please take Tylenol 1000 mg every 6 hours as needed for body aches and fevers.  If any fevers above 102, return to the urgent care facility or emergency department.  Please use warm salt water gargles to help sooth sore throat.  Make sure you are drinking lots of fluids and staying hydrated.  Take clindamycin as prescribed for 10 days.  Return to the emergency department or urgent care facility for any difficulty swallowing, worsening symptoms or to changes in your health.

## 2017-09-14 NOTE — ED Provider Notes (Signed)
[facility]    CSN: [id number] Arrival date & time: 09/14/17  0911     History   Chief Complaint Chief Complaint  Patient presents with  . Sore Throat  . Headache    HPI Tracy Bryan is a 57 y.o. female presents to the urgent care facility for evaluation of sore throat, headache that began last night.  She has had body aches, low-grade fever.  She does not take any medications for symptoms.  She denies any nausea vomiting abdominal pain.  She is tolerating p.o. well.  Sore throat pain is moderate 8 out of 10.  No known contacts with strep.  She denies any cough congestion runny nose.  HPI  Past Medical History:  Diagnosis Date  . Arthritis   . Diabetes mellitus without complication (HCC)    Type 2  . Disseminated intravascular coagulation (HCC) 1999   After child birth  . GERD (gastroesophageal reflux disease)   . Herpes simplex   . Hypercholesteremia   . Hypertension   . Migraines   . Pneumonia   . PONV (postoperative nausea and vomiting)     Patient Active Problem List   Diagnosis Date Noted  . Bilateral carpal tunnel syndrome 11/07/2016  . Herpes simplex infection 11/23/2015  . Fibromyalgia 03/11/2015  . Perimenopause 02/17/2015  . Esophageal reflux 02/17/2015  . Overweight (BMI 25.0-29.9) 02/17/2015  . Vitamin D deficiency 02/17/2015  . DM type 2 with diabetic mixed hyperlipidemia (HCC) 07/28/2014  . Essential hypertension 07/28/2014  . DM (diabetes mellitus), type 2, uncontrolled, with renal complications (HCC) 05/18/2014    Past Surgical History:  Procedure Laterality Date  . ABDOMINAL HYSTERECTOMY    . CERVICAL DISC SURGERY    . COLONOSCOPY  07/22/2013  . DILATION AND CURETTAGE OF UTERUS  1996  . HERNIA REPAIR  2000   Umblical  . HIP ARTHROPLASTY Right   . TOTAL HIP ARTHROPLASTY Left 05/18/2014   Procedure: LEFT TOTAL HIP ARTHROPLASTY ANTERIOR APPROACH;  Surgeon: Marcene CorningPeter Dalldorf, MD;  Location: [facility] OR;  Service: Orthopedics;   Laterality: Left;    OB History   None      Home Medications    Prior to Admission medications   Medication Sig Start Date End Date Taking? Authorizing Provider  aspirin 81 MG tablet Take 81 mg by mouth daily.   Yes [provider]  atorvastatin (LIPITOR) 40 MG tablet TAKE 1 TABLET (40 MG TOTAL) BY MOUTH DAILY. 05/06/17  Yes Reubin MilanBerglund, Laura H, MD  Cholecalciferol (VITAMIN D) 2000 units CAPS Take 1 capsule (2,000 Units total) by mouth daily. 02/17/15  Yes Plonk, Chrissie NoaWilliam, MD  DULoxetine (CYMBALTA) 60 MG capsule TAKE 1 CAPSULE (60 MG TOTAL) BY MOUTH DAILY. 05/06/17  Yes Reubin MilanBerglund, Laura H, MD  glucose blood test strip 1 each by Other route daily. Use as instructed   Yes [provider]  lansoprazole (PREVACID) 15 MG capsule Take 15 mg by mouth daily at 12 noon.   Yes [provider]  losartan (COZAAR) 100 MG tablet TAKE 1 TABLET (100 MG TOTAL) BY MOUTH DAILY. 03/15/17  Yes Reubin MilanBerglund, Laura H, MD  metFORMIN (GLUCOPHAGE) 1000 MG tablet Take 1 tablet (1,000 mg total) by mouth 2 (two) times daily with a meal. 07/30/17  Yes Reubin MilanBerglund, Laura H, MD  metoprolol succinate (TOPROL-XL) 50 MG 24 hr tablet Take 1 tablet (50 mg total) 2 (two) times daily by mouth. Take with or immediately following a meal. 12/22/16  Yes Reubin MilanBerglund, Laura H, MD  TRULICITY  0.75 MG/0.5ML SOPN INJECT 0.75 MG INTO THE SKIN ONCE A WEEK. 04/30/17  Yes Reubin Milan, MD  valACYclovir (VALTREX) 1000 MG tablet Take 1 tablet (1,000 mg total) by mouth 2 (two) times daily. 05/28/17  Yes Reubin Milan, MD  clindamycin (CLEOCIN) 300 MG capsule Take 1 capsule (300 mg total) by mouth 3 (three) times daily. 09/14/17   Evon Slack, PA-C  HYDROcodone-acetaminophen (NORCO/VICODIN) 5-325 MG tablet Take 1 tablet by mouth every 8 (eight) hours as needed for moderate pain. 08/16/17   Tommie Sams, DO  Multiple Vitamins-Minerals (MULTIVITAMIN WITH MINERALS) tablet Take 1 tablet by mouth daily.    [provider]     Family History Family History  Problem Relation Age of Onset  . Hypertension Mother   . Stroke Mother   . Hyperlipidemia Mother   . Heart disease Mother        heart attack  . Diabetes Father   . Cancer Father        lung  . Diabetes Sister   . Hyperlipidemia Sister   . Stroke Paternal Grandfather   . Hypertension Paternal Grandfather   . Stroke Maternal Grandfather   . Alzheimer's disease Paternal Grandmother   . Diabetes Sister   . Cancer Sister        breast  . Breast cancer Sister 63  . Breast cancer Paternal Aunt     Social History Social History   Tobacco Use  . Smoking status: Former Smoker    Last attempt to quit: 06/14/1984    Years since quitting: 33.2  . Smokeless tobacco: Never Used  Substance Use Topics  . Alcohol use: No    Alcohol/week: 0.0 oz  . Drug use: No     Allergies   Rabeprazole sodium; Augmentin [amoxicillin-pot clavulanate]; Benazepril hcl; Penicillins; and Jardiance [empagliflozin]   Review of Systems Review of Systems  Constitutional: Positive for fever.  HENT: Positive for sore throat. Negative for congestion, ear discharge, rhinorrhea, sinus pressure, sinus pain, trouble swallowing and voice change.   Respiratory: Negative for cough, shortness of breath, wheezing and stridor.   Cardiovascular: Negative for chest pain.  Gastrointestinal: Negative for abdominal pain, diarrhea, nausea and vomiting.  Genitourinary: Negative for dysuria, flank pain and pelvic pain.  Musculoskeletal: Positive for myalgias. Negative for back pain.  Skin: Negative for rash.  Neurological: Positive for headaches. Negative for dizziness.     Physical Exam Triage Vital Signs ED Triage Vitals  Enc Vitals Group     BP 09/14/17 0919 (!) 177/96     Pulse Rate 09/14/17 0919 82     Resp 09/14/17 0919 16     Temp 09/14/17 0919 99.2 F (37.3 C)     Temp Source 09/14/17 0919 Oral     SpO2 09/14/17 0919 99 %     Weight 09/14/17 0922 125 lb (56.7 kg)      Height --      Head Circumference --      Peak Flow --      Pain Score --      Pain Loc --      Pain Edu? --      Excl. in GC? --    No data found.  Updated Vital Signs BP (!) 177/96 (BP Location: Left Arm)   Pulse 82   Temp 99.2 F (37.3 C) (Oral)   Resp 16   Wt 125 lb (56.7 kg)   LMP  (LMP Unknown)   SpO2 99%  BMI 24.41 kg/m   Visual Acuity Right Eye Distance:   Left Eye Distance:   Bilateral Distance:    Right Eye Near:   Left Eye Near:    Bilateral Near:     Physical Exam  Constitutional: She is oriented to person, place, and time. She appears well-developed and well-nourished.  HENT:  Head: Normocephalic and atraumatic.  Right Ear: External ear normal.  Left Ear: External ear normal.  Mouth/Throat: No oropharyngeal exudate.  Bilateral pharyngeal erythema without exudates.  Uvula is midline with no sign of peritonsillar abscess.  Patient tolerating p.o. well.  No trismus.  Normal voice.  Eyes: Conjunctivae are normal.  Neck: Normal range of motion.  Tender anterior cervical lymphadenopathy.  Cardiovascular: Normal rate, regular rhythm and normal heart sounds.  Pulmonary/Chest: Effort normal. No stridor. No respiratory distress. She has no wheezes. She has no rales. She exhibits no tenderness.  Abdominal: Soft. She exhibits no distension. There is no tenderness.  Musculoskeletal: Normal range of motion.  Lymphadenopathy:    She has cervical adenopathy.  Neurological: She is alert and oriented to person, place, and time. Coordination normal.  Skin: Skin is warm. No rash noted.  Psychiatric: She has a normal mood and affect. Her behavior is normal. Thought content normal.     UC Treatments / Results  Labs (all labs ordered are listed, but only abnormal results are displayed) Labs Reviewed  RAPID STREP SCREEN ([facility] ONLY) - Abnormal; Notable for the following components:      Result Value   Streptococcus, Group A Screen (Direct) POSITIVE (*)     All other components within normal limits    EKG None  Radiology No results found.  Procedures Procedures (including critical care time)  Medications Ordered in UC Medications - No data to display  Initial Impression / Assessment and Plan / UC Course  I have reviewed the triage vital signs and the nursing notes.  Pertinent labs & imaging results that were available during my care of the patient were reviewed by me and considered in my medical decision making (see chart for details).     57 year old female with strep pharyngitis with low-grade fever.  She is tolerating p.o. well.  No sign of peritonsillar abscess.  She is allergic to penicillin, clindamycin was prescribed.  She will take Tylenol as needed for body aches and fevers.  She is educated on signs and symptoms to return to the clinic or ED for. Final Clinical Impressions(s) / UC Diagnoses   Final diagnoses:  Fever, unspecified  Strep pharyngitis     Discharge Instructions     Please take Tylenol 1000 mg every 6 hours as needed for body aches and fevers.  If any fevers above 102, return to the urgent care facility or emergency department.  Please use warm salt water gargles to help sooth sore throat.  Make sure you are drinking lots of fluids and staying hydrated.  Take clindamycin as prescribed for 10 days.  Return to the emergency department or urgent care facility for any difficulty swallowing, worsening symptoms or to changes in your health.    ED Prescriptions    Medication Sig Dispense Auth. Provider   clindamycin (CLEOCIN) 300 MG capsule Take 1 capsule (300 mg total) by mouth 3 (three) times daily. 30 capsule Ronnette Juniper       Evon Slack, [place] 09/14/17 [phone]

## 2017-09-14 NOTE — ED Triage Notes (Signed)
Patient c/o sore throat / headache x last night.

## 2017-09-18 ENCOUNTER — Other Ambulatory Visit: Payer: Self-pay | Admitting: Internal Medicine

## 2017-09-18 DIAGNOSIS — E1165 Type 2 diabetes mellitus with hyperglycemia: Principal | ICD-10-CM

## 2017-09-18 DIAGNOSIS — IMO0002 Reserved for concepts with insufficient information to code with codable children: Secondary | ICD-10-CM

## 2017-09-18 DIAGNOSIS — E1129 Type 2 diabetes mellitus with other diabetic kidney complication: Secondary | ICD-10-CM

## 2017-09-20 ENCOUNTER — Other Ambulatory Visit: Payer: Self-pay | Admitting: Internal Medicine

## 2017-09-27 ENCOUNTER — Other Ambulatory Visit: Payer: Self-pay | Admitting: Internal Medicine

## 2017-09-27 ENCOUNTER — Encounter: Payer: Self-pay | Admitting: Internal Medicine

## 2017-09-27 ENCOUNTER — Ambulatory Visit (INDEPENDENT_AMBULATORY_CARE_PROVIDER_SITE_OTHER): Payer: 59 | Admitting: Internal Medicine

## 2017-09-27 VITALS — BP 132/86 | HR 74 | Ht 60.0 in | Wt 126.0 lb

## 2017-09-27 DIAGNOSIS — I10 Essential (primary) hypertension: Secondary | ICD-10-CM | POA: Diagnosis not present

## 2017-09-27 DIAGNOSIS — K219 Gastro-esophageal reflux disease without esophagitis: Secondary | ICD-10-CM | POA: Diagnosis not present

## 2017-09-27 DIAGNOSIS — E785 Hyperlipidemia, unspecified: Secondary | ICD-10-CM

## 2017-09-27 DIAGNOSIS — E1169 Type 2 diabetes mellitus with other specified complication: Secondary | ICD-10-CM | POA: Diagnosis not present

## 2017-09-27 DIAGNOSIS — IMO0002 Reserved for concepts with insufficient information to code with codable children: Secondary | ICD-10-CM

## 2017-09-27 DIAGNOSIS — E1129 Type 2 diabetes mellitus with other diabetic kidney complication: Secondary | ICD-10-CM | POA: Diagnosis not present

## 2017-09-27 DIAGNOSIS — E1165 Type 2 diabetes mellitus with hyperglycemia: Secondary | ICD-10-CM

## 2017-09-27 DIAGNOSIS — N951 Menopausal and female climacteric states: Secondary | ICD-10-CM

## 2017-09-27 MED ORDER — PANTOPRAZOLE SODIUM 20 MG PO TBEC: 20.0000 mg | DELAYED_RELEASE_TABLET | Freq: Every day | ORAL | 1 refills | Status: DC

## 2017-09-27 MED ORDER — LANSOPRAZOLE 15 MG PO CPDR: 15.0000 mg | DELAYED_RELEASE_CAPSULE | Freq: Every day | ORAL | 3 refills | Status: DC

## 2017-09-27 NOTE — Patient Instructions (Signed)
Schedule Diabetic eye exam.

## 2017-09-27 NOTE — Progress Notes (Signed)
Date:  09/27/2017   Name:  Tracy Bryan   DOB:  May 22, 1960   MRN:  [id number]   Chief Complaint: Diabetes and Hypertension Diabetes  She presents for her follow-up diabetic visit. She has type 2 diabetes mellitus. Her disease course has been stable. Pertinent negatives for hypoglycemia include no headaches or tremors. Pertinent negatives for diabetes include no chest pain, no fatigue, no polydipsia and no polyuria. Current diabetic treatments: trulicity and metformin. An ACE inhibitor/angiotensin II receptor blocker is being taken. Eye exam is not current.  Hypertension  This is a chronic problem. The problem is controlled. Pertinent negatives include no chest pain, headaches, palpitations or shortness of breath. Risk factors for coronary artery disease include diabetes mellitus. Past treatments include angiotensin blockers and beta blockers.  Gastroesophageal Reflux  She reports no abdominal pain, no chest pain or no coughing. Pertinent negatives include no fatigue. She has tried a PPI for the symptoms.   Lab Results  Component Value Date   HGBA1C 6.8 (H) 05/28/2017   Lab Results  Component Value Date   CREATININE 0.86 05/28/2017   BUN 15 05/28/2017   NA 141 05/28/2017   K 5.5 (H) 05/28/2017   CL 99 05/28/2017   CO2 27 05/28/2017     Review of Systems  Constitutional: Positive for diaphoresis (at night). Negative for appetite change, fatigue, fever and unexpected weight change.  HENT: Negative for tinnitus and trouble swallowing.   Eyes: Negative for visual disturbance.  Respiratory: Negative for cough, chest tightness and shortness of breath.   Cardiovascular: Negative for chest pain, palpitations and leg swelling.  Gastrointestinal: Negative for abdominal pain.       Intermittent gerd   Endocrine: Negative for polydipsia and polyuria.  Genitourinary: Negative for dysuria and hematuria.  Musculoskeletal: Negative for arthralgias.  Neurological: Negative for  tremors, numbness and headaches.  Hematological: Negative for adenopathy.  Psychiatric/Behavioral: Negative for dysphoric mood.    Patient Active Problem List   Diagnosis Date Noted  . Bilateral carpal tunnel syndrome 11/07/2016  . Herpes simplex infection 11/23/2015  . Fibromyalgia 03/11/2015  . Perimenopause 02/17/2015  . Esophageal reflux 02/17/2015  . Overweight (BMI 25.0-29.9) 02/17/2015  . Vitamin D deficiency 02/17/2015  . Hyperlipidemia associated with type 2 diabetes mellitus (HCC) 07/28/2014  . Essential hypertension 07/28/2014  . DM (diabetes mellitus), type 2, uncontrolled, with renal complications (HCC) 05/18/2014    Allergies  Allergen Reactions  . Rabeprazole Sodium     C Diff Side Affects  . Augmentin [Amoxicillin-Pot Clavulanate] Itching  . Benazepril Hcl Cough  . Penicillins Other (See Comments)  . Jardiance [Empagliflozin] Rash    Recurrent yeast vaginitis    Past Surgical History:  Procedure Laterality Date  . ABDOMINAL HYSTERECTOMY    . CERVICAL DISC SURGERY    . COLONOSCOPY  07/22/2013  . DILATION AND CURETTAGE OF UTERUS  1996  . HERNIA REPAIR  2000   Umblical  . HIP ARTHROPLASTY Right   . TOTAL HIP ARTHROPLASTY Left 05/18/2014   Procedure: LEFT TOTAL HIP ARTHROPLASTY ANTERIOR APPROACH;  Surgeon: Marcene Corning, MD;  Location: [facility] OR;  Service: Orthopedics;  Laterality: Left;    Social History   Tobacco Use  . Smoking status: Former Smoker    Last attempt to quit: 06/14/1984    Years since quitting: 33.3  . Smokeless tobacco: Never Used  Substance Use Topics  . Alcohol use: No    Alcohol/week: 0.0 standard drinks  . Drug use: No  Medication list has been reviewed and updated.  Current Meds  Medication Sig  . aspirin 81 MG tablet Take 81 mg by mouth daily.  Marland Kitchen. atorvastatin (LIPITOR) 40 MG tablet TAKE 1 TABLET (40 MG TOTAL) BY MOUTH DAILY.  Marland Kitchen. Cholecalciferol (VITAMIN D) 2000 units CAPS Take 1 capsule (2,000 Units total) by mouth daily.  .  clindamycin (CLEOCIN) 300 MG capsule Take 1 capsule (300 mg total) by mouth 3 (three) times daily.  . DULoxetine (CYMBALTA) 60 MG capsule TAKE 1 CAPSULE (60 MG TOTAL) BY MOUTH DAILY.  Marland Kitchen. glucose blood test strip 1 each by Other route daily. Use as instructed  . HYDROcodone-acetaminophen (NORCO/VICODIN) 5-325 MG tablet Take 1 tablet by mouth every 8 (eight) hours as needed for moderate pain.  Marland Kitchen. lansoprazole (PREVACID) 15 MG capsule Take 15 mg by mouth daily at 12 noon.  Marland Kitchen. losartan (COZAAR) 100 MG tablet TAKE 1 TABLET (100 MG TOTAL) BY MOUTH DAILY.  . metFORMIN (GLUCOPHAGE) 1000 MG tablet Take 1 tablet (1,000 mg total) by mouth 2 (two) times daily with a meal.  . metoprolol succinate (TOPROL-XL) 100 MG 24 hr tablet TAKE 1/2 TABLET BY MOUTH TWICE A DAY WITH A MEAL  . Multiple Vitamins-Minerals (MULTIVITAMIN WITH MINERALS) tablet Take 1 tablet by mouth daily.  . TRULICITY 0.75 MG/0.5ML SOPN INJECT 0.75 MG INTO THE SKIN ONCE A WEEK.  . valACYclovir (VALTREX) 1000 MG tablet Take 1 tablet (1,000 mg total) by mouth 2 (two) times daily.    PHQ 2/9 Scores 09/04/2017 08/01/2016 04/12/2016 03/19/2016  PHQ - 2 Score 0 0 0 0   Wt Readings from Last 3 Encounters:  09/27/17 126 lb (57.2 kg)  09/14/17 125 lb (56.7 kg)  09/04/17 124 lb (56.2 kg)    Physical Exam  Constitutional: She is oriented to person, place, and time. She appears well-developed. No distress.  HENT:  Head: Normocephalic and atraumatic.  Neck: Normal range of motion. Neck supple.  Cardiovascular: Normal rate, regular rhythm and normal heart sounds.  Pulmonary/Chest: Effort normal and breath sounds normal. No respiratory distress.  Musculoskeletal: Normal range of motion. She exhibits no edema or tenderness.  Neurological: She is alert and oriented to person, place, and time.  Skin: Skin is warm and dry. No rash noted.  Psychiatric: She has a normal mood and affect. Her behavior is normal. Thought content normal.  Nursing note and vitals  reviewed.   BP 132/86 (BP Location: Right Arm, Patient Position: Sitting, Cuff Size: Normal)   Pulse 74   Ht 5' (1.524 m)   Wt 126 lb (57.2 kg)   LMP  (LMP Unknown)   SpO2 98%   BMI 24.61 kg/m   Assessment and Plan: 1. DM (diabetes mellitus), type 2, uncontrolled, with renal complications (HCC) Controlled Schedule DM eye exam - Hemoglobin A1c  2. Hyperlipidemia associated with type 2 diabetes mellitus (HCC) On statin therapy  3. Essential hypertension controlled  4. Gastroesophageal reflux disease, esophagitis presence not specified Continue PPI - lansoprazole (PREVACID) 15 MG capsule; Take 1 capsule (15 mg total) by mouth daily at 12 noon.  Dispense: 90 capsule; Refill: 3  5. Menopause syndrome Try melatonin to aid with sleep  Meds ordered this encounter  Medications  . lansoprazole (PREVACID) 15 MG capsule    Sig: Take 1 capsule (15 mg total) by mouth daily at 12 noon.    Dispense:  90 capsule    Refill:  3    Partially dictated using Animal nutritionistDragon software. Any errors are unintentional.  Bari EdwardLaura Jaiyon Wander, MD [facility]  09/27/2017

## 2017-09-28 LAB — HEMOGLOBIN A1C
ESTIMATED AVERAGE GLUCOSE: 148 mg/dL
HEMOGLOBIN A1C: 6.8 % — AB (ref 4.8–5.6)

## 2017-10-24 ENCOUNTER — Other Ambulatory Visit: Payer: Self-pay | Admitting: Internal Medicine

## 2017-11-06 LAB — HM DIABETES EYE EXAM

## 2017-11-08 ENCOUNTER — Encounter: Payer: Self-pay | Admitting: Internal Medicine

## 2018-01-17 ENCOUNTER — Other Ambulatory Visit: Payer: Self-pay | Admitting: Internal Medicine

## 2018-01-17 ENCOUNTER — Telehealth: Payer: Self-pay

## 2018-01-17 NOTE — Telephone Encounter (Signed)
Patient called back to schedule DM follow up in the next 30 days. Please contact patient again to have this scheduled.   Thank you.

## 2018-01-21 ENCOUNTER — Encounter: Payer: Self-pay | Admitting: Internal Medicine

## 2018-01-21 ENCOUNTER — Ambulatory Visit: Payer: 59 | Admitting: Internal Medicine

## 2018-01-21 VITALS — BP 108/68 | HR 77 | Ht 60.0 in | Wt 123.0 lb

## 2018-01-21 DIAGNOSIS — E1129 Type 2 diabetes mellitus with other diabetic kidney complication: Secondary | ICD-10-CM | POA: Diagnosis not present

## 2018-01-21 DIAGNOSIS — Z1231 Encounter for screening mammogram for malignant neoplasm of breast: Secondary | ICD-10-CM | POA: Diagnosis not present

## 2018-01-21 DIAGNOSIS — I10 Essential (primary) hypertension: Secondary | ICD-10-CM | POA: Diagnosis not present

## 2018-01-21 DIAGNOSIS — IMO0002 Reserved for concepts with insufficient information to code with codable children: Secondary | ICD-10-CM

## 2018-01-21 DIAGNOSIS — E1165 Type 2 diabetes mellitus with hyperglycemia: Secondary | ICD-10-CM

## 2018-01-21 MED ORDER — DULAGLUTIDE 0.75 MG/0.5ML ~~LOC~~ SOAJ: SUBCUTANEOUS | 12 refills | Status: DC

## 2018-01-21 NOTE — Progress Notes (Signed)
Date:  01/21/2018   Name:  Tracy Bryan   DOB:  Nov 28, 1960   MRN:  [id number]   Chief Complaint: Diabetes (Follow up. )  Diabetes  She presents for her follow-up diabetic visit. She has type 2 diabetes mellitus. Pertinent negatives for hypoglycemia include no headaches or tremors. Pertinent negatives for diabetes include no chest pain, no fatigue, no polydipsia and no polyuria. Symptoms are improving. Current diabetic treatments: Trulicity and metformin. An ACE inhibitor/angiotensin II receptor blocker is being taken.  Hypertension  This is a chronic problem. The problem is controlled. Pertinent negatives include no chest pain, headaches, palpitations or shortness of breath. Past treatments include beta blockers and angiotensin blockers.   Lab Results  Component Value Date   HGBA1C 6.8 (H) 09/27/2017     Review of Systems  Constitutional: Negative for appetite change, fatigue, fever and unexpected weight change.  HENT: Negative for tinnitus and trouble swallowing.   Eyes: Negative for visual disturbance.  Respiratory: Negative for cough, chest tightness and shortness of breath.   Cardiovascular: Negative for chest pain, palpitations and leg swelling.  Gastrointestinal: Negative for abdominal pain.  Endocrine: Negative for polydipsia and polyuria.  Genitourinary: Negative for dysuria and hematuria.  Musculoskeletal: Negative for arthralgias.  Neurological: Negative for tremors, numbness and headaches.  Psychiatric/Behavioral: Negative for dysphoric mood.    Patient Active Problem List   Diagnosis Date Noted  . Bilateral carpal tunnel syndrome 11/07/2016  . Herpes simplex infection 11/23/2015  . Fibromyalgia 03/11/2015  . Menopause syndrome 02/17/2015  . Esophageal reflux 02/17/2015  . Overweight (BMI 25.0-29.9) 02/17/2015  . Vitamin D deficiency 02/17/2015  . Hyperlipidemia associated with type 2 diabetes mellitus (HCC) 07/28/2014  . Essential hypertension  07/28/2014  . DM (diabetes mellitus), type 2, uncontrolled, with renal complications (HCC) 05/18/2014    Allergies  Allergen Reactions  . Rabeprazole Sodium     C Diff Side Affects  . Augmentin [Amoxicillin-Pot Clavulanate] Itching  . Benazepril Hcl Cough  . Penicillins Other (See Comments)  . Jardiance [Empagliflozin] Rash    Recurrent yeast vaginitis    Past Surgical History:  Procedure Laterality Date  . ABDOMINAL HYSTERECTOMY    . CERVICAL DISC SURGERY    . COLONOSCOPY  07/22/2013  . DILATION AND CURETTAGE OF UTERUS  1996  . HERNIA REPAIR  2000   Umblical  . HIP ARTHROPLASTY Right   . TOTAL HIP ARTHROPLASTY Left 05/18/2014   Procedure: LEFT TOTAL HIP ARTHROPLASTY ANTERIOR APPROACH;  Surgeon: Marcene Corning, MD;  Location: [facility] OR;  Service: Orthopedics;  Laterality: Left;    Social History   Tobacco Use  . Smoking status: Former Smoker    Last attempt to quit: 06/14/1984    Years since quitting: 33.6  . Smokeless tobacco: Never Used  Substance Use Topics  . Alcohol use: No    Alcohol/week: 0.0 standard drinks  . Drug use: No     Medication list has been reviewed and updated.  Current Meds  Medication Sig  . aspirin 81 MG tablet Take 81 mg by mouth daily.  Marland Kitchen atorvastatin (LIPITOR) 40 MG tablet TAKE 1 TABLET (40 MG TOTAL) BY MOUTH DAILY.  Marland Kitchen Cholecalciferol (VITAMIN D) 2000 units CAPS Take 1 capsule (2,000 Units total) by mouth daily.  . DULoxetine (CYMBALTA) 60 MG capsule TAKE 1 CAPSULE (60 MG TOTAL) BY MOUTH DAILY.  Marland Kitchen glucose blood test strip 1 each by Other route daily. Use as instructed  . losartan (COZAAR) 100 MG tablet TAKE 1 TABLET (  100 MG TOTAL) BY MOUTH DAILY.  . metFORMIN (GLUCOPHAGE) 1000 MG tablet Take 1 tablet (1,000 mg total) by mouth 2 (two) times daily with a meal.  . metoprolol succinate (TOPROL-XL) 100 MG 24 hr tablet TAKE 1/2 TABLET BY MOUTH TWICE A DAY WITH A MEAL  . Multiple Vitamins-Minerals (MULTIVITAMIN WITH MINERALS) tablet Take 1 tablet by  mouth daily.  . pantoprazole (PROTONIX) 20 MG tablet Take 1 tablet (20 mg total) by mouth daily.  . TRULICITY 0.75 MG/0.5ML SOPN INJECT 0.75 MG INTO THE SKIN ONCE A WEEK.  . valACYclovir (VALTREX) 1000 MG tablet Take 1 tablet (1,000 mg total) by mouth 2 (two) times daily.    PHQ 2/9 Scores 09/04/2017 08/01/2016 04/12/2016 03/19/2016  PHQ - 2 Score 0 0 0 0    Physical Exam Wt Readings from Last 3 Encounters:  01/21/18 123 lb (55.8 kg)  09/27/17 126 lb (57.2 kg)  09/14/17 125 lb (56.7 kg)    BP 108/68 (BP Location: Right Arm, Patient Position: Sitting, Cuff Size: Normal)   Pulse 77   Ht 5' (1.524 m)   Wt 123 lb (55.8 kg)   LMP  (LMP Unknown)   SpO2 97%   BMI 24.02 kg/m   Assessment and Plan: 1. DM (diabetes mellitus), type 2, uncontrolled, with renal complications (HCC) Controlled, continue current medications - Dulaglutide (TRULICITY) 0.75 MG/0.5ML SOPN; INJECT 0.75 MG INTO THE SKIN ONCE A WEEK.  Dispense: 4 pen; Refill: 12 - Hemoglobin A1c  2. Essential hypertension controlled  3. Encounter for screening mammogram for breast cancer - MM 3D SCREEN BREAST BILATERAL; Future   Partially dictated using Animal nutritionistDragon software. Any errors are unintentional.  Bari EdwardLaura Maddelynn Moosman, MD [facility]  01/21/2018

## 2018-01-22 LAB — HEMOGLOBIN A1C
ESTIMATED AVERAGE GLUCOSE: 143 mg/dL
Hgb A1c MFr Bld: 6.6 % — ABNORMAL HIGH (ref 4.8–5.6)

## 2018-02-01 ENCOUNTER — Other Ambulatory Visit: Payer: Self-pay

## 2018-02-01 ENCOUNTER — Ambulatory Visit
Admission: EM | Admit: 2018-02-01 | Discharge: 2018-02-01 | Disposition: A | Discharge status: Home / Self Care | Payer: 59 | Attending: Emergency Medicine | Admitting: Emergency Medicine

## 2018-02-01 ENCOUNTER — Encounter: Payer: Self-pay | Admitting: Gynecology

## 2018-02-01 DIAGNOSIS — L304 Erythema intertrigo: Secondary | ICD-10-CM | POA: Diagnosis not present

## 2018-02-01 MED ORDER — TRIAMCINOLONE ACETONIDE 0.1 % EX CREA: 1.0000 "application " | TOPICAL_CREAM | Freq: Two times a day (BID) | CUTANEOUS | 0 refills | Status: DC

## 2018-02-01 MED ORDER — MUPIROCIN 2 % EX OINT: 1.0000 "application " | TOPICAL_OINTMENT | Freq: Two times a day (BID) | CUTANEOUS | 0 refills | Status: DC

## 2018-02-01 MED ORDER — NYSTATIN 100000 UNIT/GM EX CREA: TOPICAL_CREAM | CUTANEOUS | 0 refills | Status: DC

## 2018-02-01 NOTE — Discharge Instructions (Addendum)
The nystatin will take care of the infection, the Bactroban will help a secondary bacterial infection and the triamcinolone will help with the itching.  Keep this as dry as you possibly can.  Make sure you dry it off with a hair dryer after you bathe And in between applying the ointment.

## 2018-02-01 NOTE — ED Triage Notes (Signed)
Patient c/o rash at crease on abdomen region. Per patient notice rash last night.

## 2018-02-01 NOTE — ED Provider Notes (Signed)
HPI  SUBJECTIVE:  Tracy Bryan is a 57 y.o. female who presents with an erythematous, itchy, burning symmetric rash on her lower abdomen starting last night.  States it is located in her stomach crease.  She denies crusting, blisters, proceeding paresthesias.  No new lotions, soaps, detergents.  No fevers, body aches.  No rash elsewhere.  She tried Neosporin without improvement in her symptoms.  No aggravating factors. Past Medical history of diabetes, postpartum DIC, HSV, hypertension, deaf skin infections.  No history of MRSA or skin yeast infections.  PMD: Reubin MilanBerglund, Laura H, MD   Past Medical History:  Diagnosis Date  . Arthritis   . Diabetes mellitus without complication (HCC)    Type 2  . Disseminated intravascular coagulation (HCC) 1999   After child birth  . GERD (gastroesophageal reflux disease)   . Herpes simplex   . Hypercholesteremia   . Hypertension   . Migraines   . Pneumonia   . PONV (postoperative nausea and vomiting)     Past Surgical History:  Procedure Laterality Date  . ABDOMINAL HYSTERECTOMY    . CERVICAL DISC SURGERY    . COLONOSCOPY  07/22/2013  . DILATION AND CURETTAGE OF UTERUS  1996  . HERNIA REPAIR  2000   Umblical  . HIP ARTHROPLASTY Right   . TOTAL HIP ARTHROPLASTY Left 05/18/2014   Procedure: LEFT TOTAL HIP ARTHROPLASTY ANTERIOR APPROACH;  Surgeon: Marcene CorningPeter Dalldorf, MD;  Location: [facility] OR;  Service: Orthopedics;  Laterality: Left;    Family History  Problem Relation Age of Onset  . Hypertension Mother   . Stroke Mother   . Hyperlipidemia Mother   . Heart disease Mother        heart attack  . Diabetes Father   . Cancer Father        lung  . Diabetes Sister   . Hyperlipidemia Sister   . Stroke Paternal Grandfather   . Hypertension Paternal Grandfather   . Stroke Maternal Grandfather   . Alzheimer's disease Paternal Grandmother   . Diabetes Sister   . Cancer Sister        breast  . Breast cancer Sister [id number]  . Breast cancer Paternal  Aunt     Social History   Tobacco Use  . Smoking status: Former Smoker    Last attempt to quit: 06/14/1984    Years since quitting: 33.6  . Smokeless tobacco: Never Used  Substance Use Topics  . Alcohol use: No    Alcohol/week: 0.0 standard drinks  . Drug use: No    No current facility-administered medications for this encounter.   Current Outpatient Medications:  .  aspirin 81 MG tablet, Take 81 mg by mouth daily., Disp: , Rfl:  .  atorvastatin (LIPITOR) 40 MG tablet, TAKE 1 TABLET (40 MG TOTAL) BY MOUTH DAILY., Disp: 90 tablet, Rfl: 0 .  Cholecalciferol (VITAMIN D) 2000 units CAPS, Take 1 capsule (2,000 Units total) by mouth daily., Disp: 30 capsule, Rfl:  .  Dulaglutide (TRULICITY) 0.75 MG/0.5ML SOPN, INJECT 0.75 MG INTO THE SKIN ONCE A WEEK., Disp: 4 pen, Rfl: 12 .  DULoxetine (CYMBALTA) 60 MG capsule, TAKE 1 CAPSULE (60 MG TOTAL) BY MOUTH DAILY., Disp: 90 capsule, Rfl: 3 .  glucose blood test strip, 1 each by Other route daily. Use as instructed, Disp: , Rfl:  .  metFORMIN (GLUCOPHAGE) 1000 MG tablet, Take 1 tablet (1,000 mg total) by mouth 2 (two) times daily with a meal., Disp: 180 tablet, Rfl: 3 .  metoprolol  succinate (TOPROL-XL) 100 MG 24 hr tablet, TAKE 1/2 TABLET BY MOUTH TWICE A DAY WITH A MEAL, Disp: 90 tablet, Rfl: 1 .  Multiple Vitamins-Minerals (MULTIVITAMIN WITH MINERALS) tablet, Take 1 tablet by mouth daily., Disp: , Rfl:  .  pantoprazole (PROTONIX) 20 MG tablet, Take 1 tablet (20 mg total) by mouth daily., Disp: 90 tablet, Rfl: 1 .  valACYclovir (VALTREX) 1000 MG tablet, Take 1 tablet (1,000 mg total) by mouth 2 (two) times daily., Disp: 20 tablet, Rfl: 0 .  losartan (COZAAR) 100 MG tablet, TAKE 1 TABLET (100 MG TOTAL) BY MOUTH DAILY., Disp: 90 tablet, Rfl: 3 .  mupirocin ointment (BACTROBAN) 2 %, Apply 1 application topically 2 (two) times daily., Disp: 22 g, Rfl: 0 .  nystatin cream (MYCOSTATIN), Apply to affected area 2 times daily till symptoms resolve, Disp: 30  g, Rfl: 0 .  triamcinolone cream (KENALOG) 0.1 %, Apply 1 application topically 2 (two) times daily. Apply for 2 weeks. May use on face, Disp: 30 g, Rfl: 0  Allergies  Allergen Reactions  . Rabeprazole Sodium     C Diff Side Affects  . Augmentin [Amoxicillin-Pot Clavulanate] Itching  . Benazepril Hcl Cough  . Penicillins Other (See Comments)  . Jardiance [Empagliflozin] Rash    Recurrent yeast vaginitis     ROS  As noted in HPI.   Physical Exam  BP (!) 177/104 (BP Location: Right Arm)   Pulse 70   Temp 97.9 F (36.6 C) (Oral)   Resp 16   Wt 55.8 kg   LMP  (LMP Unknown)   SpO2 100%   BMI 24.02 kg/m   Constitutional: Well developed, well nourished, no acute distress Eyes:  EOMI, conjunctiva normal bilaterally HENT: Normocephalic, atraumatic,mucus membranes moist Respiratory: Normal inspiratory effort Cardiovascular: Normal rate GI: nondistended Skin: 5x2 cm symmetric erythematous nontender wet macerated rash in the right abdominal skin fold consistent with intertrigo.  No crusting.  Small lesion on the left side.  See picture     Musculoskeletal: no deformities Neurologic: Alert & oriented x 3, no focal neuro deficits Psychiatric: Speech and behavior appropriate   ED Course   Medications - No data to display  No orders of the defined types were placed in this encounter.   No results found for this or any previous visit (from the past 24 hour(s)). No results found.  ED Clinical Impression  Intertrigo   ED Assessment/Plan  Home with nystatin, Bactroban will prevent secondary bacterial infection, triamcinolone for the burning and itching.  Keep this as dry as possible.  Follow-up with PMD as needed in a week if not better.  Meds ordered this encounter  Medications  . mupirocin ointment (BACTROBAN) 2 %    Sig: Apply 1 application topically 2 (two) times daily.    Dispense:  22 g    Refill:  0  . nystatin cream (MYCOSTATIN)    Sig: Apply to affected  area 2 times daily till symptoms resolve    Dispense:  30 g    Refill:  0  . triamcinolone cream (KENALOG) 0.1 %    Sig: Apply 1 application topically 2 (two) times daily. Apply for 2 weeks. May use on face    Dispense:  30 g    Refill:  0    *This clinic note was created using Scientist, clinical (histocompatibility and immunogenetics)Dragon dictation software. Therefore, there may be occasional mistakes despite careful proofreading.   ?   Domenick GongMortenson, Sura Canul, MD 02/02/18 [phone]

## 2018-02-07 ENCOUNTER — Telehealth: Payer: Self-pay

## 2018-02-07 NOTE — Telephone Encounter (Signed)
Completed PA for patient on covermymeds.  Tonita CongSHARON Janice (Key: [id number]: [id number])  Trulicity 0.75MG /0.5ML pen-injectors   Awaiting faxed outcome from [employer] for this PA. Should received within 72 hours.

## 2018-02-10 NOTE — Telephone Encounter (Signed)
Fax [place] from [employer] stating TRULICITY 0.75mg /0.5 pen injector has been approved through:  02/07/2018-02/08/2019  Approval may be limited as follows:  1. By dosing limits. Dosing limits may be established in accordance wit FDA approved labeling, accepted compendia, evidence-based practive guidelines or your prescription drug plan benefits; 2. By indication. For some products, coverage may be available for select indications only; 3. By [employer] Drug Code. Drug products are identified by unique numerical product identifiers, called NDC's, which identify by the manufacturer, strength, dosage from, formulation and package size. Some NDC's may not be covered.

## 2018-03-14 ENCOUNTER — Other Ambulatory Visit: Payer: Self-pay | Admitting: Internal Medicine

## 2018-03-14 DIAGNOSIS — I10 Essential (primary) hypertension: Secondary | ICD-10-CM

## 2018-03-23 ENCOUNTER — Other Ambulatory Visit: Payer: Self-pay | Admitting: Internal Medicine

## 2018-03-25 ENCOUNTER — Other Ambulatory Visit: Payer: Self-pay | Admitting: Internal Medicine

## 2018-04-07 ENCOUNTER — Ambulatory Visit: Payer: 59

## 2018-04-26 ENCOUNTER — Other Ambulatory Visit: Payer: Self-pay | Admitting: Internal Medicine

## 2018-05-29 ENCOUNTER — Ambulatory Visit
Admission: EM | Admit: 2018-05-29 | Discharge: 2018-05-29 | Disposition: A | Discharge status: Home / Self Care | Payer: 59 | Attending: Family Medicine | Admitting: Family Medicine

## 2018-05-29 ENCOUNTER — Other Ambulatory Visit: Payer: Self-pay

## 2018-05-29 DIAGNOSIS — W268XXA Contact with other sharp object(s), not elsewhere classified, initial encounter: Secondary | ICD-10-CM | POA: Diagnosis not present

## 2018-05-29 DIAGNOSIS — S61217A Laceration without foreign body of left little finger without damage to nail, initial encounter: Secondary | ICD-10-CM

## 2018-05-29 NOTE — ED Triage Notes (Signed)
Patient complains of laceration to her left pinky finger. Patient states that she cut her finger with a pair of scissors. Around 30 mins ago.

## 2018-05-29 NOTE — ED Provider Notes (Signed)
[facility]    CSN: [id number] Arrival date & time: 05/29/18  1000  History   Chief Complaint Chief Complaint  Patient presents with  . Laceration   HPI  58 year old female presents with a laceration.  Patient was using a pair of scissors this morning.  She inadvertently cut her left fifth digit.  Occurred approximately 30 minutes ago.  Per the medical record, her last tetanus was on 07/30/2016.  She cover the wound and came directly in for evaluation.  No medications taken.  Bleeding is well controlled.  Patient denies pain at this time.  No other reported symptoms.  No other complaints.  Past Medical History:  Diagnosis Date  . Arthritis   . Diabetes mellitus without complication (HCC)    Type 2  . Disseminated intravascular coagulation (HCC) 1999   After child birth  . GERD (gastroesophageal reflux disease)   . Herpes simplex   . Hypercholesteremia   . Hypertension   . Migraines   . Pneumonia   . PONV (postoperative nausea and vomiting)     Patient Active Problem List   Diagnosis Date Noted  . Bilateral carpal tunnel syndrome 11/07/2016  . Herpes simplex infection 11/23/2015  . Fibromyalgia 03/11/2015  . Menopause syndrome 02/17/2015  . Esophageal reflux 02/17/2015  . Overweight (BMI 25.0-29.9) 02/17/2015  . Vitamin D deficiency 02/17/2015  . Hyperlipidemia associated with type 2 diabetes mellitus (HCC) 07/28/2014  . Essential hypertension 07/28/2014  . DM (diabetes mellitus), type 2, uncontrolled, with renal complications (HCC) 05/18/2014    Past Surgical History:  Procedure Laterality Date  . ABDOMINAL HYSTERECTOMY    . CERVICAL DISC SURGERY    . COLONOSCOPY  07/22/2013  . DILATION AND CURETTAGE OF UTERUS  1996  . HERNIA REPAIR  2000   Umblical  . HIP ARTHROPLASTY Right   . TOTAL HIP ARTHROPLASTY Left 05/18/2014   Procedure: LEFT TOTAL HIP ARTHROPLASTY ANTERIOR APPROACH;  Surgeon: Marcene Corning, MD;  Location: [facility] OR;  Service: Orthopedics;   Laterality: Left;    OB History   No obstetric history on file.      Home Medications    Prior to Admission medications   Medication Sig Start Date End Date Taking? Authorizing Provider  aspirin 81 MG tablet Take 81 mg by mouth daily.   Yes [provider]  atorvastatin (LIPITOR) 40 MG tablet TAKE 1 TABLET (40 MG TOTAL) BY MOUTH DAILY. 01/17/18  Yes Reubin Milan, MD  Cholecalciferol (VITAMIN D) 2000 units CAPS Take 1 capsule (2,000 Units total) by mouth daily. 02/17/15  Yes Plonk, Chrissie Noa, MD  Dulaglutide (TRULICITY) 0.75 MG/0.5ML SOPN INJECT 0.75 MG INTO THE SKIN ONCE A WEEK. 01/21/18  Yes Reubin Milan, MD  DULoxetine (CYMBALTA) 60 MG capsule TAKE 1 CAPSULE (60 MG TOTAL) BY MOUTH DAILY. 04/26/18  Yes Reubin Milan, MD  glucose blood test strip 1 each by Other route daily. Use as instructed   Yes [provider]  losartan (COZAAR) 100 MG tablet TAKE 1 TABLET (100 MG TOTAL) BY MOUTH DAILY. 03/14/18  Yes Reubin Milan, MD  metFORMIN (GLUCOPHAGE) 1000 MG tablet Take 1 tablet (1,000 mg total) by mouth 2 (two) times daily with a meal. 07/30/17  Yes Reubin Milan, MD  metoprolol succinate (TOPROL-XL) 100 MG 24 hr tablet TAKE 1/2 TABLET BY MOUTH TWICE A DAY WITH A MEAL 03/25/18  Yes Reubin Milan, MD  Multiple Vitamins-Minerals (MULTIVITAMIN WITH MINERALS) tablet Take 1 tablet by mouth daily.  Yes [provider]  nystatin cream (MYCOSTATIN) Apply to affected area 2 times daily till symptoms resolve 02/01/18  Yes Domenick GongMortenson, Ashley, MD  valACYclovir (VALTREX) 1000 MG tablet Take 1 tablet (1,000 mg total) by mouth 2 (two) times daily. 05/28/17  Yes Reubin MilanBerglund, Laura H, MD    Family History Family History  Problem Relation Age of Onset  . Hypertension Mother   . Stroke Mother   . Hyperlipidemia Mother   . Heart disease Mother        heart attack  . Diabetes Father   . Cancer Father        lung  . Diabetes Sister   . Hyperlipidemia Sister   .  Stroke Paternal Grandfather   . Hypertension Paternal Grandfather   . Stroke Maternal Grandfather   . Alzheimer's disease Paternal Grandmother   . Diabetes Sister   . Cancer Sister        breast  . Breast cancer Sister [phone]  . Breast cancer Paternal Aunt     Social History Social History   Tobacco Use  . Smoking status: Former Smoker    Last attempt to quit: 06/14/1984    Years since quitting: 33.9  . Smokeless tobacco: Never Used  Substance Use Topics  . Alcohol use: No    Alcohol/week: 0.0 standard drinks  . Drug use: No     Allergies   Rabeprazole sodium; Augmentin [amoxicillin-pot clavulanate]; Benazepril hcl; Penicillins; and Jardiance [empagliflozin]   Review of Systems Review of Systems  Constitutional: Negative.   Skin: Positive for wound.   Physical Exam Triage Vital Signs ED Triage Vitals [05/29/18 1013]  Enc Vitals Group     BP (!) 158/103     Pulse Rate 72     Resp 16     Temp 98.1 F (36.7 C)     Temp Source Oral     SpO2 100 %     Weight 123 lb (55.8 kg)     Height 5' (1.524 m)     Head Circumference      Peak Flow      Pain Score 0     Pain Loc      Pain Edu?      Excl. in GC?    Updated Vital Signs BP (!) 158/103 (BP Location: Left Arm)   Pulse 72   Temp 98.1 F (36.7 C) (Oral)   Resp 16   Ht 5' (1.524 m)   Wt 55.8 kg   LMP  (LMP Unknown)   SpO2 100%   BMI 24.02 kg/m   Visual Acuity Right Eye Distance:   Left Eye Distance:   Bilateral Distance:    Right Eye Near:   Left Eye Near:    Bilateral Near:     Physical Exam Vitals signs and nursing note reviewed.  Constitutional:      General: She is not in acute distress.    Appearance: Normal appearance.  HENT:     Head: Normocephalic and atraumatic.  Eyes:     General:        Right eye: No discharge.        Left eye: No discharge.     Conjunctiva/sclera: Conjunctivae normal.  Pulmonary:     Effort: Pulmonary effort is normal. No respiratory distress.  Skin:     Comments: 2 small approximately 1 cm lacerations noted on the palmar aspect of the distal fifth digit (left hand).   Neurological:     Mental Status: She  is alert.  Psychiatric:        Mood and Affect: Mood normal.        Behavior: Behavior normal.    UC Treatments / Results  Labs (all labs ordered are listed, but only abnormal results are displayed) Labs Reviewed - No data to display  EKG None  Radiology No results found.  Procedures Laceration Repair Date/Time: 05/29/2018 10:43 AM Performed by: Tommie Sams, DO Authorized by: Tommie Sams, DO   Consent:    Consent obtained:  Verbal   Consent given by:  Patient Anesthesia (see MAR for exact dosages):    Anesthesia method:  None Laceration details:    Location:  Finger   Finger location:  L small finger   Wound length (cm): 2, ~ 1 cm lacerations. Repair type:    Repair type:  Simple Exploration:    Hemostasis achieved with:  Direct pressure   Contaminated: no   Treatment:    Area cleansed with:  Soap and water Skin repair:    Repair method:  Tissue adhesive (Dermadond use to close the wound.) Approximation:    Approximation:  Close Post-procedure details:    Dressing:  Bulky dressing   Patient tolerance of procedure:  Tolerated well, no immediate complications   (including critical care time)  Medications Ordered in UC Medications - No data to display  Initial Impression / Assessment and Plan / UC Course  I have reviewed the triage vital signs and the nursing notes.  Pertinent labs & imaging results that were available during my care of the patient were reviewed by me and considered in my medical decision making (see chart for details).    58 year old female presents with a laceration.  Laceration was closed with Dermabond.  Wound was dressed.  Supportive care.  Final Clinical Impressions(s) / UC Diagnoses   Final diagnoses:  Laceration of left little finger without foreign body without damage to  nail, initial encounter   Discharge Instructions   None    ED Prescriptions    None     Controlled Substance Prescriptions [place] Controlled Substance Registry consulted? Not Applicable   Tommie Sams, DO 05/29/18 1044

## 2018-06-14 ENCOUNTER — Other Ambulatory Visit: Payer: Self-pay | Admitting: Internal Medicine

## 2018-07-17 ENCOUNTER — Encounter: Payer: Self-pay | Admitting: Internal Medicine

## 2018-07-17 ENCOUNTER — Other Ambulatory Visit: Payer: Self-pay

## 2018-07-17 ENCOUNTER — Ambulatory Visit (INDEPENDENT_AMBULATORY_CARE_PROVIDER_SITE_OTHER): Payer: 59 | Admitting: Internal Medicine

## 2018-07-17 VITALS — BP 130/88 | HR 79 | Ht 60.0 in | Wt 124.0 lb

## 2018-07-17 DIAGNOSIS — E1169 Type 2 diabetes mellitus with other specified complication: Secondary | ICD-10-CM

## 2018-07-17 DIAGNOSIS — E785 Hyperlipidemia, unspecified: Secondary | ICD-10-CM | POA: Diagnosis not present

## 2018-07-17 DIAGNOSIS — E118 Type 2 diabetes mellitus with unspecified complications: Secondary | ICD-10-CM | POA: Diagnosis not present

## 2018-07-17 DIAGNOSIS — Z1231 Encounter for screening mammogram for malignant neoplasm of breast: Secondary | ICD-10-CM

## 2018-07-17 DIAGNOSIS — I10 Essential (primary) hypertension: Secondary | ICD-10-CM

## 2018-07-17 DIAGNOSIS — Z0000 Encounter for general adult medical examination without abnormal findings: Secondary | ICD-10-CM

## 2018-07-17 LAB — POCT URINALYSIS DIPSTICK
Bilirubin, UA: NEGATIVE
Blood, UA: NEGATIVE
Glucose, UA: NEGATIVE
Ketones, UA: NEGATIVE
Leukocytes, UA: NEGATIVE
Nitrite, UA: NEGATIVE
Protein, UA: POSITIVE — AB
Spec Grav, UA: 1.025 (ref 1.010–1.025)
Urobilinogen, UA: 0.2 E.U./dL
pH, UA: 5 (ref 5.0–8.0)

## 2018-07-17 MED ORDER — METFORMIN HCL ER 500 MG PO TB24: 500.0000 mg | ORAL_TABLET | Freq: Two times a day (BID) | ORAL | 3 refills | Status: DC

## 2018-07-17 MED ORDER — ATORVASTATIN CALCIUM 40 MG PO TABS: 40.0000 mg | ORAL_TABLET | Freq: Every day | ORAL | 3 refills | Status: DC

## 2018-07-17 NOTE — Patient Instructions (Signed)
**Schedule Diabetic Eye Exam**   Health Maintenance for Postmenopausal Women Menopause is a normal process in which your reproductive ability comes to an end. This process happens gradually over a span of months to years, usually between the ages of 41 and 56. Menopause is complete when you have missed 12 consecutive menstrual periods. It is important to talk with your health care provider about some of the most common conditions that affect postmenopausal women, such as heart disease, cancer, and bone loss (osteoporosis). Adopting a healthy lifestyle and getting preventive care can help to promote your health and wellness. Those actions can also lower your chances of developing some of these common conditions. What should I know about menopause? During menopause, you may experience a number of symptoms, such as:  Moderate-to-severe hot flashes.  Night sweats.  Decrease in sex drive.  Mood swings.  Headaches.  Tiredness.  Irritability.  Memory problems.  Insomnia. Choosing to treat or not to treat menopausal changes is an individual decision that you make with your health care provider. What should I know about hormone replacement therapy and supplements? Hormone therapy products are effective for treating symptoms that are associated with menopause, such as hot flashes and night sweats. Hormone replacement carries certain risks, especially as you become older. If you are thinking about using estrogen or estrogen with progestin treatments, discuss the benefits and risks with your health care provider. What should I know about heart disease and stroke? Heart disease, heart attack, and stroke become more likely as you age. This may be due, in part, to the hormonal changes that your body experiences during menopause. These can affect how your body processes dietary fats, triglycerides, and cholesterol. Heart attack and stroke are both medical emergencies. There are many things that you  can do to help prevent heart disease and stroke:  Have your blood pressure checked at least every 1-2 years. High blood pressure causes heart disease and increases the risk of stroke.  If you are 42-99 years old, ask your health care provider if you should take aspirin to prevent a heart attack or a stroke.  Do not use any tobacco products, including cigarettes, chewing tobacco, or electronic cigarettes. If you need help quitting, ask your health care provider.  It is important to eat a healthy diet and maintain a healthy weight. ? Be sure to include plenty of vegetables, fruits, low-fat dairy products, and lean protein. ? Avoid eating foods that are high in solid fats, added sugars, or salt (sodium).  Get regular exercise. This is one of the most important things that you can do for your health. ? Try to exercise for at least 150 minutes each week. The type of exercise that you do should increase your heart rate and make you sweat. This is known as moderate-intensity exercise. ? Try to do strengthening exercises at least twice each week. Do these in addition to the moderate-intensity exercise.  Know your numbers.Ask your health care provider to check your cholesterol and your blood glucose. Continue to have your blood tested as directed by your health care provider.  What should I know about cancer screening? There are several types of cancer. Take the following steps to reduce your risk and to catch any cancer development as early as possible. Breast Cancer  Practice breast self-awareness. ? This means understanding how your breasts normally appear and feel. ? It also means doing regular breast self-exams. Let your health care provider know about any changes, no matter how small.  If you are 29 or older, have a clinician do a breast exam (clinical breast exam or CBE) every year. Depending on your age, family history, and medical history, it may be recommended that you also have a yearly  breast X-ray (mammogram).  If you have a family history of breast cancer, talk with your health care provider about genetic screening.  If you are at high risk for breast cancer, talk with your health care provider about having an MRI and a mammogram every year.  Breast cancer (BRCA) gene test is recommended for women who have family members with BRCA-related cancers. Results of the assessment will determine the need for genetic counseling and BRCA1 and for BRCA2 testing. BRCA-related cancers include these types: ? Breast. This occurs in males or females. ? Ovarian. ? Tubal. This may also be called fallopian tube cancer. ? Cancer of the abdominal or pelvic lining (peritoneal cancer). ? Prostate. ? Pancreatic. Cervical, Uterine, and Ovarian Cancer Your health care provider may recommend that you be screened regularly for cancer of the pelvic organs. These include your ovaries, uterus, and vagina. This screening involves a pelvic exam, which includes checking for microscopic changes to the surface of your cervix (Pap test).  For women ages 21-65, health care providers may recommend a pelvic exam and a Pap test every three years. For women ages 71-65, they may recommend the Pap test and pelvic exam, combined with testing for human papilloma virus (HPV), every five years. Some types of HPV increase your risk of cervical cancer. Testing for HPV may also be done on women of any age who have unclear Pap test results.  Other health care providers may not recommend any screening for nonpregnant women who are considered low risk for pelvic cancer and have no symptoms. Ask your health care provider if a screening pelvic exam is right for you.  If you have had past treatment for cervical cancer or a condition that could lead to cancer, you need Pap tests and screening for cancer for at least 20 years after your treatment. If Pap tests have been discontinued for you, your risk factors (such as having a new  sexual partner) need to be reassessed to determine if you should start having screenings again. Some women have medical problems that increase the chance of getting cervical cancer. In these cases, your health care provider may recommend that you have screening and Pap tests more often.  If you have a family history of uterine cancer or ovarian cancer, talk with your health care provider about genetic screening.  If you have vaginal bleeding after reaching menopause, tell your health care provider.  There are currently no reliable tests available to screen for ovarian cancer. Lung Cancer Lung cancer screening is recommended for adults 39-61 years old who are at high risk for lung cancer because of a history of smoking. A yearly low-dose CT scan of the lungs is recommended if you:  Currently smoke.  Have a history of at least 30 pack-years of smoking and you currently smoke or have quit within the past 15 years. A pack-year is smoking an average of one pack of cigarettes per day for one year. Yearly screening should:  Continue until it has been 15 years since you quit.  Stop if you develop a health problem that would prevent you from having lung cancer treatment. Colorectal Cancer  This type of cancer can be detected and can often be prevented.  Routine colorectal cancer screening usually begins at  age 75 and continues through age 58.  If you have risk factors for colon cancer, your health care provider may recommend that you be screened at an earlier age.  If you have a family history of colorectal cancer, talk with your health care provider about genetic screening.  Your health care provider may also recommend using home test kits to check for hidden blood in your stool.  A small camera at the end of a tube can be used to examine your colon directly (sigmoidoscopy or colonoscopy). This is done to check for the earliest forms of colorectal cancer.  Direct examination of the colon  should be repeated every 5-10 years until age 19. However, if early forms of precancerous polyps or small growths are found or if you have a family history or genetic risk for colorectal cancer, you may need to be screened more often. Skin Cancer  Check your skin from head to toe regularly.  Monitor any moles. Be sure to tell your health care provider: ? About any new moles or changes in moles, especially if there is a change in a mole's shape or color. ? If you have a mole that is larger than the size of a pencil eraser.  If any of your family members has a history of skin cancer, especially at a young age, talk with your health care provider about genetic screening.  Always use sunscreen. Apply sunscreen liberally and repeatedly throughout the day.  Whenever you are outside, protect yourself by wearing long sleeves, pants, a wide-brimmed hat, and sunglasses. What should I know about osteoporosis? Osteoporosis is a condition in which bone destruction happens more quickly than new bone creation. After menopause, you may be at an increased risk for osteoporosis. To help prevent osteoporosis or the bone fractures that can happen because of osteoporosis, the following is recommended:  If you are 62-59 years old, get at least 1,000 mg of calcium and at least 600 mg of vitamin D per day.  If you are older than age 29 but younger than age 76, get at least 1,200 mg of calcium and at least 600 mg of vitamin D per day.  If you are older than age 46, get at least 1,200 mg of calcium and at least 800 mg of vitamin D per day. Smoking and excessive alcohol intake increase the risk of osteoporosis. Eat foods that are rich in calcium and vitamin D, and do weight-bearing exercises several times each week as directed by your health care provider. What should I know about how menopause affects my mental health? Depression may occur at any age, but it is more common as you become older. Common symptoms of  depression include:  Low or sad mood.  Changes in sleep patterns.  Changes in appetite or eating patterns.  Feeling an overall lack of motivation or enjoyment of activities that you previously enjoyed.  Frequent crying spells. Talk with your health care provider if you think that you are experiencing depression. What should I know about immunizations? It is important that you get and maintain your immunizations. These include:  Tetanus, diphtheria, and pertussis (Tdap) booster vaccine.  Influenza every year before the flu season begins.  Pneumonia vaccine.  Shingles vaccine. Your health care provider may also recommend other immunizations. This information is not intended to replace advice given to you by your health care provider. Make sure you discuss any questions you have with your health care provider. Document Released: 03/23/2005 Document Revised: 08/19/2015 Document Reviewed:  11/02/2014 Elsevier Interactive Patient Education  2019 [employer]  

## 2018-07-17 NOTE — Progress Notes (Signed)
Date:  07/17/2018   Name:  Tracy CoinSharon S Hodkinson   DOB:  February 24, 1960   MRN:  [id number]   Chief Complaint: Annual Exam (Breast Exam. Patient needs new RX for metformin sent in. She was sent in 1000 BID and she is onyl take 500 mg BID. She is having to cut this in half and medication is expiring by the time she needs new RF.) She denies breast issues.   Mammogram 02/2017 Colonoscopy 06/2013 Eye exam 10/2017 Immunizations are up to date  Diabetes  She presents for her follow-up diabetic visit. She has type 2 diabetes mellitus. Her disease course has been stable. Pertinent negatives for hypoglycemia include no dizziness, headaches, nervousness/anxiousness or tremors. Pertinent negatives for diabetes include no chest pain, no fatigue, no polydipsia and no polyuria. Symptoms are stable. Her weight is stable. She is following a diabetic diet. She participates in exercise daily. She monitors blood glucose at home 1-2 x per day. Her breakfast blood glucose is taken between 6-7 am. Her breakfast blood glucose range is generally 110-130 mg/dl. An ACE inhibitor/angiotensin II receptor blocker is being taken. Eye exam is current.  Hypertension  This is a chronic problem. The problem is controlled. Pertinent negatives include no chest pain, headaches, palpitations or shortness of breath.  Hyperlipidemia  This is a chronic problem. The problem is controlled. Pertinent negatives include no chest pain or shortness of breath. Current antihyperlipidemic treatment includes statins. The current treatment provides significant improvement of lipids.   Lab Results  Component Value Date   HGBA1C 6.6 (H) 01/21/2018   Lab Results  Component Value Date   CHOL 204 (H) 05/28/2017   HDL 49 05/28/2017   LDLCALC Comment 05/28/2017   TRIG 469 (H) 05/28/2017   CHOLHDL 4.2 05/28/2017   Lab Results  Component Value Date   CREATININE 0.86 05/28/2017   BUN 15 05/28/2017   NA 141 05/28/2017   K 5.5 (H) 05/28/2017   CL 99  05/28/2017   CO2 27 05/28/2017     Review of Systems  Constitutional: Negative for chills, fatigue and fever.  HENT: Positive for hearing loss (not bothersome to her). Negative for congestion, tinnitus, trouble swallowing and voice change.   Eyes: Negative for visual disturbance.  Respiratory: Negative for cough, chest tightness, shortness of breath and wheezing.   Cardiovascular: Negative for chest pain, palpitations and leg swelling.  Gastrointestinal: Negative for abdominal pain, constipation, diarrhea and vomiting.  Endocrine: Negative for polydipsia and polyuria.  Genitourinary: Negative for dysuria, frequency, genital sores, vaginal bleeding and vaginal discharge.  Musculoskeletal: Negative for arthralgias, gait problem and joint swelling.  Skin: Negative for color change and rash.  Neurological: Negative for dizziness, tremors, light-headedness and headaches.  Hematological: Negative for adenopathy. Does not bruise/bleed easily.  Psychiatric/Behavioral: Negative for dysphoric mood and sleep disturbance. The patient is not nervous/anxious.     Patient Active Problem List   Diagnosis Date Noted  . Bilateral carpal tunnel syndrome 11/07/2016  . Herpes simplex infection 11/23/2015  . Fibromyalgia 03/11/2015  . Menopause syndrome 02/17/2015  . Esophageal reflux 02/17/2015  . Overweight (BMI 25.0-29.9) 02/17/2015  . Vitamin D deficiency 02/17/2015  . Hyperlipidemia associated with type 2 diabetes mellitus (HCC) 07/28/2014  . Essential hypertension 07/28/2014  . Type II diabetes mellitus with complication (HCC) 05/18/2014    Allergies  Allergen Reactions  . Rabeprazole Sodium     C Diff Side Affects  . Augmentin [Amoxicillin-Pot Clavulanate] Itching  . Benazepril Hcl Cough  . Penicillins Other (  See Comments)  . Jardiance [Empagliflozin] Rash    Recurrent yeast vaginitis    Past Surgical History:  Procedure Laterality Date  . ABDOMINAL HYSTERECTOMY    . CERVICAL DISC  SURGERY    . COLONOSCOPY  07/22/2013  . DILATION AND CURETTAGE OF UTERUS  1996  . HERNIA REPAIR  2000   Umblical  . HIP ARTHROPLASTY Right   . TOTAL HIP ARTHROPLASTY Left 05/18/2014   Procedure: LEFT TOTAL HIP ARTHROPLASTY ANTERIOR APPROACH;  Surgeon: Marcene Corning, MD;  Location: [facility] OR;  Service: Orthopedics;  Laterality: Left;    Social History   Tobacco Use  . Smoking status: Former Smoker    Last attempt to quit: 06/14/1984    Years since quitting: 34.1  . Smokeless tobacco: Never Used  Substance Use Topics  . Alcohol use: No    Alcohol/week: 0.0 standard drinks  . Drug use: No     Medication list has been reviewed and updated.  Current Meds  Medication Sig  . aspirin 81 MG tablet Take 81 mg by mouth daily.  . Cholecalciferol (VITAMIN D) 2000 units CAPS Take 1 capsule (2,000 Units total) by mouth daily.  . Dulaglutide (TRULICITY) 0.75 MG/0.5ML SOPN INJECT 0.75 MG INTO THE SKIN ONCE A WEEK.  . DULoxetine (CYMBALTA) 60 MG capsule TAKE 1 CAPSULE (60 MG TOTAL) BY MOUTH DAILY.  Marland Kitchen glucose blood test strip 1 each by Other route daily. Use as instructed  . losartan (COZAAR) 100 MG tablet TAKE 1 TABLET (100 MG TOTAL) BY MOUTH DAILY.  . metoprolol succinate (TOPROL-XL) 100 MG 24 hr tablet TAKE 1/2 TABLET BY MOUTH TWICE A DAY WITH A MEAL  . Multiple Vitamins-Minerals (MULTIVITAMIN WITH MINERALS) tablet Take 1 tablet by mouth daily.  . valACYclovir (VALTREX) 1000 MG tablet Take 1 tablet (1,000 mg total) by mouth 2 (two) times daily. (Patient taking differently: Take 1,000 mg by mouth 2 (two) times daily. PRN+)  . [DISCONTINUED] atorvastatin (LIPITOR) 40 MG tablet TAKE 1 TABLET (40 MG TOTAL) BY MOUTH DAILY.  . [DISCONTINUED] metFORMIN (GLUCOPHAGE) 1000 MG tablet Take 1 tablet (1,000 mg total) by mouth 2 (two) times daily with a meal. (Patient taking differently: Take 500 mg by mouth 2 (two) times daily with a meal. )  . atorvastatin (LIPITOR) 40 MG tablet Take 1 tablet (40 mg total) by  mouth daily.    PHQ 2/9 Scores 07/17/2018 09/04/2017 08/01/2016 04/12/2016  PHQ - 2 Score 0 0 0 0    BP Readings from Last 3 Encounters:  07/17/18 130/88  05/29/18 (!) 158/103  02/01/18 (!) 177/104    Physical Exam Vitals signs and nursing note reviewed.  Constitutional:      General: She is not in acute distress.    Appearance: She is well-developed.  HENT:     Head: Normocephalic and atraumatic.     Right Ear: Decreased hearing noted. There is impacted cerumen.     Left Ear: Decreased hearing noted. There is impacted cerumen.     Nose:     Right Sinus: No maxillary sinus tenderness.     Left Sinus: No maxillary sinus tenderness.     Mouth/Throat:     Pharynx: Uvula midline.  Eyes:     General: No scleral icterus.       Right eye: No discharge.        Left eye: No discharge.     Conjunctiva/sclera: Conjunctivae normal.  Neck:     Musculoskeletal: Normal range of motion. No erythema.  Thyroid: No thyromegaly.     Vascular: No carotid bruit.  Cardiovascular:     Rate and Rhythm: Normal rate and regular rhythm.     Pulses: Normal pulses.     Heart sounds: Normal heart sounds.  Pulmonary:     Effort: Pulmonary effort is normal. No respiratory distress.     Breath sounds: No wheezing.  Chest:     Breasts:        Right: No mass, nipple discharge, skin change or tenderness.        Left: No mass, nipple discharge, skin change or tenderness.  Abdominal:     General: Bowel sounds are normal.     Palpations: Abdomen is soft.     Tenderness: There is no abdominal tenderness.  Musculoskeletal: Normal range of motion.  Lymphadenopathy:     Cervical: No cervical adenopathy.  Skin:    General: Skin is warm and dry.     Findings: No rash.     Comments: Tanned skin with evidence of tanning bed use  Neurological:     Mental Status: She is alert and oriented to person, place, and time.     Cranial Nerves: No cranial nerve deficit.     Sensory: No sensory deficit.     Deep  Tendon Reflexes: Reflexes are normal and symmetric.  Psychiatric:        Attention and Perception: Attention normal.        Mood and Affect: Mood normal.        Speech: Speech normal.        Behavior: Behavior normal.        Thought Content: Thought content normal.        Cognition and Memory: Cognition normal.     Wt Readings from Last 3 Encounters:  07/17/18 124 lb (56.2 kg)  05/29/18 123 lb (55.8 kg)  02/01/18 123 lb (55.8 kg)    BP 130/88   Pulse 79   Ht 5' (1.524 m)   Wt 124 lb (56.2 kg)   LMP  (LMP Unknown)   SpO2 98%   BMI 24.22 kg/m   Assessment and Plan: 1. Annual physical exam Normal exam Continue healthy diet and exercise - POCT urinalysis dipstick  2. Encounter for screening mammogram for breast cancer To be scheduled at [facility]  3. Essential hypertension controlled - CBC with Differential/Platelet - TSH  4. Type II diabetes mellitus with complication (HCC) Doing well on current regimen Pt will schedule Eye exam - Comprehensive metabolic panel - Hemoglobin A1c - metFORMIN (GLUCOPHAGE-XR) 500 MG 24 hr tablet; Take 1 tablet (500 mg total) by mouth 2 (two) times a day.  Dispense: 180 tablet; Refill: 3  5. Hyperlipidemia associated with type 2 diabetes mellitus (HCC) On statin therapy - Lipid panel - atorvastatin (LIPITOR) 40 MG tablet; Take 1 tablet (40 mg total) by mouth daily.  Dispense: 90 tablet; Refill: 3   Partially dictated using Animal nutritionist. Any errors are unintentional.  Bari Edward, MD [facility]  07/17/2018

## 2018-07-18 LAB — COMPREHENSIVE METABOLIC PANEL
ALT: 30 IU/L (ref 0–32)
AST: 22 IU/L (ref 0–40)
Albumin/Globulin Ratio: 2 (ref 1.2–2.2)
Albumin: 4.7 g/dL (ref 3.8–4.9)
Alkaline Phosphatase: 64 IU/L (ref 39–117)
BUN/Creatinine Ratio: 16 (ref 12–28)
BUN: 13 mg/dL (ref 8–27)
Bilirubin Total: 0.3 mg/dL (ref 0.0–1.2)
CO2: 23 mmol/L (ref 20–29)
Calcium: 9.7 mg/dL (ref 8.7–10.3)
Chloride: 101 mmol/L (ref 96–106)
Creatinine, Ser: 0.8 mg/dL (ref 0.57–1.00)
GFR calc Af Amer: 93 mL/min/{1.73_m2} (ref >59)
GFR calc non Af Amer: 80 mL/min/{1.73_m2} (ref >59)
Globulin, Total: 2.4 g/dL (ref 1.5–4.5)
Glucose: 109 mg/dL — ABNORMAL HIGH (ref 65–99)
Potassium: 4.9 mmol/L (ref 3.5–5.2)
Sodium: 141 mmol/L (ref 134–144)
Total Protein: 7.1 g/dL (ref 6.0–8.5)

## 2018-07-18 LAB — CBC WITH DIFFERENTIAL/PLATELET
Basophils Absolute: 0.1 10*3/uL (ref 0.0–0.2)
Basos: 2 %
EOS (ABSOLUTE): 0.3 10*3/uL (ref 0.0–0.4)
Eos: 4 %
Hematocrit: 42.7 % (ref 34.0–46.6)
Hemoglobin: 14.2 g/dL (ref 11.1–15.9)
Immature Grans (Abs): 0.1 10*3/uL (ref 0.0–0.1)
Immature Granulocytes: 1 %
Lymphocytes Absolute: 2.3 10*3/uL (ref 0.7–3.1)
Lymphs: 35 %
MCH: 29.7 pg (ref 26.6–33.0)
MCHC: 33.3 g/dL (ref 31.5–35.7)
MCV: 89 fL (ref 79–97)
Monocytes Absolute: 0.5 10*3/uL (ref 0.1–0.9)
Monocytes: 8 %
Neutrophils Absolute: 3.4 10*3/uL (ref 1.4–7.0)
Neutrophils: 50 %
Platelets: 307 10*3/uL (ref 150–450)
RBC: 4.78 x10E6/uL (ref 3.77–5.28)
RDW: 13.1 % (ref 11.7–15.4)
WBC: 6.7 10*3/uL (ref 3.4–10.8)

## 2018-07-18 LAB — LIPID PANEL
Chol/HDL Ratio: 3.8 ratio (ref 0.0–4.4)
Cholesterol, Total: 177 mg/dL (ref 100–199)
HDL: 46 mg/dL (ref >39)
LDL Calculated: 56 mg/dL (ref 0–99)
Triglycerides: 375 mg/dL — ABNORMAL HIGH (ref 0–149)
VLDL Cholesterol Cal: 75 mg/dL — ABNORMAL HIGH (ref 5–40)

## 2018-07-18 LAB — TSH: TSH: 1.43 u[IU]/mL (ref 0.450–4.500)

## 2018-07-18 LAB — HEMOGLOBIN A1C
Est. average glucose Bld gHb Est-mCnc: 143 mg/dL
Hgb A1c MFr Bld: 6.6 % — ABNORMAL HIGH (ref 4.8–5.6)

## 2018-08-18 ENCOUNTER — Other Ambulatory Visit: Payer: Self-pay

## 2018-08-18 ENCOUNTER — Encounter: Payer: Self-pay | Admitting: Internal Medicine

## 2018-08-18 ENCOUNTER — Ambulatory Visit: Payer: 59 | Admitting: Internal Medicine

## 2018-08-18 VITALS — BP 142/90 | HR 77 | Temp 97.4°F | Ht 60.0 in | Wt 126.0 lb

## 2018-08-18 DIAGNOSIS — N3000 Acute cystitis without hematuria: Secondary | ICD-10-CM | POA: Diagnosis not present

## 2018-08-18 LAB — POCT URINALYSIS DIPSTICK
Bilirubin, UA: NEGATIVE
Glucose, UA: NEGATIVE
Ketones, UA: NEGATIVE
Nitrite, UA: NEGATIVE
Protein, UA: POSITIVE — AB
Spec Grav, UA: 1.015 (ref 1.010–1.025)
Urobilinogen, UA: 0.2 E.U./dL
pH, UA: 7 (ref 5.0–8.0)

## 2018-08-18 MED ORDER — NITROFURANTOIN MONOHYD MACRO 100 MG PO CAPS: 100.0000 mg | ORAL_CAPSULE | Freq: Two times a day (BID) | ORAL | 0 refills | Status: AC

## 2018-08-18 NOTE — Progress Notes (Signed)
Date:  08/18/2018   Name:  Tracy Bryan   DOB:  1960/03/18   MRN:  [id number]   Chief Complaint: Urinary Tract Infection  Urinary Tract Infection  This is a new problem. The current episode started yesterday. The problem occurs every urination. The problem has been unchanged. The quality of the pain is described as burning. The pain is mild. There has been no fever. Associated symptoms include frequency, hesitancy and urgency. Pertinent negatives include no chills, flank pain, hematuria, nausea or vomiting. She has tried nothing for the symptoms.    Review of Systems  Constitutional: Negative for chills and fever.  Gastrointestinal: Negative for nausea and vomiting.  Genitourinary: Positive for frequency, hesitancy and urgency. Negative for flank pain and hematuria.    Patient Active Problem List   Diagnosis Date Noted  . Bilateral carpal tunnel syndrome 11/07/2016  . Herpes simplex infection 11/23/2015  . Fibromyalgia 03/11/2015  . Menopause syndrome 02/17/2015  . Esophageal reflux 02/17/2015  . Overweight (BMI 25.0-29.9) 02/17/2015  . Vitamin D deficiency 02/17/2015  . Hyperlipidemia associated with type 2 diabetes mellitus (HCC) 07/28/2014  . Essential hypertension 07/28/2014  . Type II diabetes mellitus with complication (HCC) 05/18/2014    Allergies  Allergen Reactions  . Rabeprazole Sodium     C Diff Side Affects  . Augmentin [Amoxicillin-Pot Clavulanate] Itching  . Benazepril Hcl Cough  . Penicillins Other (See Comments)  . Jardiance [Empagliflozin] Rash    Recurrent yeast vaginitis    Past Surgical History:  Procedure Laterality Date  . ABDOMINAL HYSTERECTOMY    . CERVICAL DISC SURGERY    . COLONOSCOPY  07/22/2013  . DILATION AND CURETTAGE OF UTERUS  1996  . HERNIA REPAIR  2000   Umblical  . HIP ARTHROPLASTY Right   . TOTAL HIP ARTHROPLASTY Left 05/18/2014   Procedure: LEFT TOTAL HIP ARTHROPLASTY ANTERIOR APPROACH;  Surgeon: Marcene CorningPeter Dalldorf, MD;   Location: [facility] OR;  Service: Orthopedics;  Laterality: Left;    Social History   Tobacco Use  . Smoking status: Former Smoker    Quit date: 06/14/1984    Years since quitting: 34.2  . Smokeless tobacco: Never Used  Substance Use Topics  . Alcohol use: No    Alcohol/week: 0.0 standard drinks  . Drug use: No     Medication list has been reviewed and updated.  Current Meds  Medication Sig  . aspirin 81 MG tablet Take 81 mg by mouth daily.  Marland Kitchen. atorvastatin (LIPITOR) 40 MG tablet Take 1 tablet (40 mg total) by mouth daily.  . Cholecalciferol (VITAMIN D) 2000 units CAPS Take 1 capsule (2,000 Units total) by mouth daily.  . Dulaglutide (TRULICITY) 0.75 MG/0.5ML SOPN INJECT 0.75 MG INTO THE SKIN ONCE A WEEK.  . DULoxetine (CYMBALTA) 60 MG capsule TAKE 1 CAPSULE (60 MG TOTAL) BY MOUTH DAILY.  Marland Kitchen. glucose blood test strip 1 each by Other route daily. Use as instructed  . losartan (COZAAR) 100 MG tablet TAKE 1 TABLET (100 MG TOTAL) BY MOUTH DAILY.  . metFORMIN (GLUCOPHAGE-XR) 500 MG 24 hr tablet Take 1 tablet (500 mg total) by mouth 2 (two) times a day.  . metoprolol succinate (TOPROL-XL) 100 MG 24 hr tablet TAKE 1/2 TABLET BY MOUTH TWICE A DAY WITH A MEAL  . Multiple Vitamins-Minerals (MULTIVITAMIN WITH MINERALS) tablet Take 1 tablet by mouth daily.  . valACYclovir (VALTREX) 1000 MG tablet Take 1 tablet (1,000 mg total) by mouth 2 (two) times daily. (Patient taking differently: Take 1,000  mg by mouth 2 (two) times daily. PRN+)    PHQ 2/9 Scores 08/18/2018 07/17/2018 09/04/2017 08/01/2016  PHQ - 2 Score 0 0 0 0  PHQ- 9 Score 0 - - -    BP Readings from Last 3 Encounters:  08/18/18 (!) 142/90  07/17/18 130/88  05/29/18 (!) 158/103    Physical Exam Vitals signs and nursing note reviewed.  Constitutional:      Appearance: She is well-developed.  Cardiovascular:     Rate and Rhythm: Normal rate and regular rhythm.     Heart sounds: Normal heart sounds.  Pulmonary:     Effort: Pulmonary  effort is normal. No respiratory distress.     Breath sounds: Normal breath sounds.  Abdominal:     General: Bowel sounds are normal.     Palpations: Abdomen is soft.     Tenderness: There is abdominal tenderness in the suprapubic area. There is no guarding or rebound.     Wt Readings from Last 3 Encounters:  08/18/18 126 lb (57.2 kg)  07/17/18 124 lb (56.2 kg)  05/29/18 123 lb (55.8 kg)    BP (!) 142/90   Pulse 77   Temp (!) 97.4 F (36.3 C) (Temporal)   Ht 5' (1.524 m)   Wt 126 lb (57.2 kg)   LMP  (LMP Unknown)   SpO2 98%   BMI 24.61 kg/m   Assessment and Plan: 1. Acute cystitis without hematuria Increase fluids Return if no improvement - nitrofurantoin, macrocrystal-monohydrate, (MACROBID) 100 MG capsule; Take 1 capsule (100 mg total) by mouth 2 (two) times daily for 7 days.  Dispense: 14 capsule; Refill: 0 - POCT urinalysis dipstick   Partially dictated using Editor, commissioning. Any errors are unintentional.  Halina Maidens, MD [facility]  08/18/2018

## 2018-10-30 ENCOUNTER — Emergency Department (HOSPITAL_COMMUNITY)
Admission: EM | Admit: 2018-10-30 | Discharge: 2018-10-30 | Disposition: A | Discharge status: Home / Self Care | Payer: No Typology Code available for payment source | Attending: Emergency Medicine | Admitting: Emergency Medicine

## 2018-10-30 ENCOUNTER — Emergency Department (HOSPITAL_COMMUNITY): Payer: No Typology Code available for payment source

## 2018-10-30 ENCOUNTER — Other Ambulatory Visit: Payer: Self-pay

## 2018-10-30 DIAGNOSIS — Y999 Unspecified external cause status: Secondary | ICD-10-CM | POA: Diagnosis not present

## 2018-10-30 DIAGNOSIS — S80811A Abrasion, right lower leg, initial encounter: Secondary | ICD-10-CM | POA: Insufficient documentation

## 2018-10-30 DIAGNOSIS — M542 Cervicalgia: Secondary | ICD-10-CM | POA: Diagnosis not present

## 2018-10-30 DIAGNOSIS — S80812A Abrasion, left lower leg, initial encounter: Secondary | ICD-10-CM | POA: Insufficient documentation

## 2018-10-30 DIAGNOSIS — Z7982 Long term (current) use of aspirin: Secondary | ICD-10-CM | POA: Insufficient documentation

## 2018-10-30 DIAGNOSIS — R110 Nausea: Secondary | ICD-10-CM | POA: Insufficient documentation

## 2018-10-30 DIAGNOSIS — H539 Unspecified visual disturbance: Secondary | ICD-10-CM | POA: Insufficient documentation

## 2018-10-30 DIAGNOSIS — Y929 Unspecified place or not applicable: Secondary | ICD-10-CM | POA: Insufficient documentation

## 2018-10-30 DIAGNOSIS — T07XXXA Unspecified multiple injuries, initial encounter: Secondary | ICD-10-CM | POA: Diagnosis present

## 2018-10-30 DIAGNOSIS — I10 Essential (primary) hypertension: Secondary | ICD-10-CM | POA: Diagnosis not present

## 2018-10-30 DIAGNOSIS — Z87891 Personal history of nicotine dependence: Secondary | ICD-10-CM | POA: Insufficient documentation

## 2018-10-30 DIAGNOSIS — R51 Headache: Secondary | ICD-10-CM | POA: Insufficient documentation

## 2018-10-30 DIAGNOSIS — Y939 Activity, unspecified: Secondary | ICD-10-CM | POA: Diagnosis not present

## 2018-10-30 DIAGNOSIS — E119 Type 2 diabetes mellitus without complications: Secondary | ICD-10-CM | POA: Insufficient documentation

## 2018-10-30 DIAGNOSIS — Z7984 Long term (current) use of oral hypoglycemic drugs: Secondary | ICD-10-CM | POA: Diagnosis not present

## 2018-10-30 DIAGNOSIS — R519 Headache, unspecified: Secondary | ICD-10-CM

## 2018-10-30 MED ORDER — HYDROMORPHONE HCL 1 MG/ML IJ SOLN
1.0000 mg | INTRAMUSCULAR | Status: AC | PRN
  Administered 2018-10-30: 1 mg via INTRAVENOUS
  Filled 2018-10-30: qty 1

## 2018-10-30 MED ORDER — HYDROMORPHONE HCL 1 MG/ML IJ SOLN
0.5000 mg | Freq: Once | INTRAMUSCULAR | Status: AC
  Administered 2018-10-30: 17:00:00 0.5 mg via INTRAVENOUS
  Filled 2018-10-30: qty 1

## 2018-10-30 MED ORDER — HYDROMORPHONE HCL 1 MG/ML IJ SOLN
0.5000 mg | Freq: Once | INTRAMUSCULAR | Status: AC
  Administered 2018-10-30: 20:00:00 0.5 mg via INTRAVENOUS
  Filled 2018-10-30: qty 1

## 2018-10-30 MED ORDER — METHOCARBAMOL 500 MG PO TABS: 500.0000 mg | ORAL_TABLET | Freq: Three times a day (TID) | ORAL | 0 refills | Status: DC | PRN

## 2018-10-30 MED ORDER — HYDROMORPHONE HCL 1 MG/ML IJ SOLN
0.5000 mg | Freq: Once | INTRAMUSCULAR | Status: AC
  Administered 2018-10-30: 0.5 mg via INTRAVENOUS
  Filled 2018-10-30: qty 1

## 2018-10-30 MED ORDER — ONDANSETRON HCL 4 MG/2ML IJ SOLN
4.0000 mg | Freq: Once | INTRAMUSCULAR | Status: AC
  Administered 2018-10-30: 4 mg via INTRAVENOUS
  Filled 2018-10-30: qty 2

## 2018-10-30 NOTE — ED Notes (Signed)
Patient verbalizes understanding of discharge instructions. Opportunity for questioning and answers were provided. Armband removed by staff, pt discharged from ED.  

## 2018-10-30 NOTE — ED Notes (Signed)
Pt ambulated in hallway without difficulty
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
## 2018-11-17 ENCOUNTER — Other Ambulatory Visit: Payer: Self-pay

## 2018-11-17 ENCOUNTER — Ambulatory Visit: Payer: 59 | Admitting: Internal Medicine

## 2018-11-17 ENCOUNTER — Encounter: Payer: Self-pay | Admitting: Internal Medicine

## 2018-11-17 VITALS — BP 136/72 | HR 76 | Ht 60.0 in | Wt 125.0 lb

## 2018-11-17 DIAGNOSIS — I10 Essential (primary) hypertension: Secondary | ICD-10-CM

## 2018-11-17 DIAGNOSIS — Z23 Encounter for immunization: Secondary | ICD-10-CM

## 2018-11-17 DIAGNOSIS — E1169 Type 2 diabetes mellitus with other specified complication: Secondary | ICD-10-CM

## 2018-11-17 DIAGNOSIS — E785 Hyperlipidemia, unspecified: Secondary | ICD-10-CM

## 2018-11-17 DIAGNOSIS — E118 Type 2 diabetes mellitus with unspecified complications: Secondary | ICD-10-CM | POA: Diagnosis not present

## 2018-11-17 MED ORDER — TRULICITY 0.75 MG/0.5ML ~~LOC~~ SOAJ: SUBCUTANEOUS | 12 refills | Status: DC
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
## 2018-11-18 LAB — HEMOGLOBIN A1C
Est. average glucose Bld gHb Est-mCnc: 137 mg/dL
Hgb A1c MFr Bld: 6.4 % — ABNORMAL HIGH (ref 4.8–5.6)

## 2018-12-18 ENCOUNTER — Other Ambulatory Visit: Payer: Self-pay | Admitting: Orthopedic Surgery

## 2018-12-18 ENCOUNTER — Other Ambulatory Visit (HOSPITAL_COMMUNITY): Payer: Self-pay | Admitting: Orthopedic Surgery

## 2018-12-18 DIAGNOSIS — M25551 Pain in right hip: Secondary | ICD-10-CM

## 2018-12-31 ENCOUNTER — Other Ambulatory Visit: Payer: Self-pay

## 2018-12-31 ENCOUNTER — Ambulatory Visit (HOSPITAL_COMMUNITY)
Admission: RE | Admit: 2018-12-31 | Discharge: 2018-12-31 | Disposition: A | Discharge status: Home / Self Care | Payer: 59 | Source: Ambulatory Visit | Attending: Orthopedic Surgery | Admitting: Orthopedic Surgery

## 2018-12-31 ENCOUNTER — Encounter (HOSPITAL_COMMUNITY): Payer: Self-pay

## 2018-12-31 ENCOUNTER — Encounter (HOSPITAL_COMMUNITY)
Admission: RE | Admit: 2018-12-31 | Discharge: 2018-12-31 | Disposition: A | Discharge status: Home / Self Care | Payer: 59 | Source: Ambulatory Visit | Attending: Orthopedic Surgery | Admitting: Orthopedic Surgery

## 2018-12-31 DIAGNOSIS — M25551 Pain in right hip: Secondary | ICD-10-CM | POA: Insufficient documentation

## 2018-12-31 LAB — POCT I-STAT CREATININE: Creatinine, Ser: 0.7 mg/dL (ref 0.44–1.00)

## 2018-12-31 MED ORDER — IOHEXOL 300 MG/ML  SOLN
75.0000 mL | Freq: Once | INTRAMUSCULAR | Status: AC | PRN
  Administered 2018-12-31: 13:00:00 75 mL via INTRAVENOUS

## 2018-12-31 MED ORDER — SODIUM CHLORIDE (PF) 0.9 % IJ SOLN
INTRAMUSCULAR | Status: AC
  Filled 2018-12-31: qty 50

## 2018-12-31 MED ORDER — TECHNETIUM TC 99M MEDRONATE IV KIT
20.2000 | PACK | Freq: Once | INTRAVENOUS | Status: AC
  Administered 2018-12-31: 20.2 via INTRAVENOUS

## 2019-01-27 ENCOUNTER — Telehealth: Payer: Self-pay

## 2019-01-27 NOTE — Telephone Encounter (Signed)
Completed PA on Patient medication for Trulicity 0.75mg  pen injector on [email or web address].  Key: Loleta Rose

## 2019-01-28 NOTE — Telephone Encounter (Signed)
Received note from [place] saying Prior Josem Kaufmann is not needed at this time.

## 2019-02-08 ENCOUNTER — Other Ambulatory Visit: Payer: Self-pay | Admitting: Internal Medicine

## 2019-02-08 DIAGNOSIS — I10 Essential (primary) hypertension: Secondary | ICD-10-CM

## 2019-02-15 ENCOUNTER — Other Ambulatory Visit: Payer: Self-pay | Admitting: Internal Medicine

## 2019-02-15 DIAGNOSIS — B009 Herpesviral infection, unspecified: Secondary | ICD-10-CM

## 2019-03-02 ENCOUNTER — Other Ambulatory Visit: Payer: Self-pay | Admitting: Internal Medicine

## 2019-03-02 DIAGNOSIS — Z1231 Encounter for screening mammogram for malignant neoplasm of breast: Secondary | ICD-10-CM

## 2019-03-09 ENCOUNTER — Telehealth: Payer: Self-pay

## 2019-03-09 NOTE — Telephone Encounter (Signed)
Completed PA on Trulicity 0.75 mg/0.54mL with [email or web address].  (Key: [id number])  "Your information has been submitted to [employer]. To check for an updated outcome later, reopen this PA request from your dashboard.  If [employer] has not responded to your request within 24 hours, contact [employer] at [phone]. If you think there may be a problem with your PA request, use our live chat feature at the bottom right."  Awaiting outcome.

## 2019-03-10 NOTE — Telephone Encounter (Signed)
Received FAX from insurance saying PA was APPROVED   03/09/2019-03/08/2020.

## 2019-03-17 ENCOUNTER — Other Ambulatory Visit: Payer: Self-pay

## 2019-03-17 ENCOUNTER — Encounter (INDEPENDENT_AMBULATORY_CARE_PROVIDER_SITE_OTHER): Payer: Self-pay

## 2019-03-17 ENCOUNTER — Ambulatory Visit
Admission: RE | Admit: 2019-03-17 | Discharge: 2019-03-17 | Disposition: A | Discharge status: Home / Self Care | Payer: 59 | Source: Ambulatory Visit | Attending: Internal Medicine | Admitting: Internal Medicine

## 2019-03-17 DIAGNOSIS — Z1231 Encounter for screening mammogram for malignant neoplasm of breast: Secondary | ICD-10-CM | POA: Diagnosis present

## 2019-03-25 ENCOUNTER — Other Ambulatory Visit: Payer: Self-pay

## 2019-03-25 ENCOUNTER — Encounter: Payer: Self-pay | Admitting: Internal Medicine

## 2019-03-25 ENCOUNTER — Ambulatory Visit (INDEPENDENT_AMBULATORY_CARE_PROVIDER_SITE_OTHER): Payer: 59 | Admitting: Internal Medicine

## 2019-03-25 VITALS — BP 132/78 | HR 90 | Temp 98.6°F | Ht 60.0 in | Wt 127.0 lb

## 2019-03-25 DIAGNOSIS — I10 Essential (primary) hypertension: Secondary | ICD-10-CM

## 2019-03-25 DIAGNOSIS — E118 Type 2 diabetes mellitus with unspecified complications: Secondary | ICD-10-CM

## 2019-03-25 NOTE — Patient Instructions (Signed)
Covid Vaccine locations:  [facility] [phone]  [place] [phone]  [email or web address]  

## 2019-03-25 NOTE — Progress Notes (Signed)
Date:  03/25/2019   Name:  Tracy Bryan   DOB:  08/03/1960   MRN:  [id number]   Chief Complaint: Diabetes (Follow up.) and Hypertension  Diabetes She presents for her follow-up diabetic visit. She has type 2 diabetes mellitus. Her disease course has been stable. Pertinent negatives for hypoglycemia include no headaches or tremors. Pertinent negatives for diabetes include no chest pain, no fatigue, no polydipsia and no polyuria. Current diabetic treatment includes oral agent (monotherapy) (and Trulicity). She monitors blood glucose at home 1-2 x per day. Her breakfast blood glucose is taken between 6-7 am. Her breakfast blood glucose range is generally 110-130 mg/dl. An ACE inhibitor/angiotensin II receptor blocker is being taken.  Hypertension This is a chronic problem. The problem is controlled. Pertinent negatives include no chest pain, headaches, palpitations or shortness of breath. Past treatments include angiotensin blockers and beta blockers. The current treatment provides significant improvement.    Lab Results  Component Value Date   CREATININE 0.70 12/31/2018   BUN 13 07/17/2018   NA 141 07/17/2018   K 4.9 07/17/2018   CL 101 07/17/2018   CO2 23 07/17/2018   Lab Results  Component Value Date   CHOL 177 07/17/2018   HDL 46 07/17/2018   LDLCALC 56 07/17/2018   TRIG 375 (H) 07/17/2018   CHOLHDL 3.8 07/17/2018   Lab Results  Component Value Date   TSH 1.430 07/17/2018   Lab Results  Component Value Date   HGBA1C 6.4 (H) 11/17/2018     Review of Systems  Constitutional: Negative for appetite change, chills, fatigue, fever and unexpected weight change.  HENT: Negative for tinnitus and trouble swallowing.   Eyes: Negative for visual disturbance.  Respiratory: Negative for cough, chest tightness and shortness of breath.   Cardiovascular: Negative for chest pain, palpitations and leg swelling.  Gastrointestinal: Negative for abdominal pain.  Endocrine:  Negative for polydipsia and polyuria.  Genitourinary: Negative for dysuria and hematuria.  Musculoskeletal: Negative for arthralgias.  Neurological: Negative for tremors, numbness and headaches.  Psychiatric/Behavioral: Negative for dysphoric mood.    Patient Active Problem List   Diagnosis Date Noted  . Bilateral carpal tunnel syndrome 11/07/2016  . Herpes simplex infection 11/23/2015  . Fibromyalgia 03/11/2015  . Menopause syndrome 02/17/2015  . Esophageal reflux 02/17/2015  . Overweight (BMI 25.0-29.9) 02/17/2015  . Vitamin D deficiency 02/17/2015  . Hyperlipidemia associated with type 2 diabetes mellitus ([place]) 07/28/2014  . Essential hypertension 07/28/2014  . Type II diabetes mellitus with complication ([place]) 76/28/3151    Allergies  Allergen Reactions  . Rabeprazole Sodium     C Diff Side Affects  . Augmentin [Amoxicillin-Pot Clavulanate] Itching  . Benazepril Hcl Cough  . Penicillins Other (See Comments)  . Jardiance [Empagliflozin] Rash    Recurrent yeast vaginitis    Past Surgical History:  Procedure Laterality Date  . ABDOMINAL HYSTERECTOMY    . CERVICAL DISC SURGERY    . COLONOSCOPY  07/22/2013  . DILATION AND CURETTAGE OF UTERUS  1996  . HERNIA REPAIR  7616   Umblical  . HIP ARTHROPLASTY Right   . TOTAL HIP ARTHROPLASTY Left 05/18/2014   Procedure: LEFT TOTAL HIP ARTHROPLASTY ANTERIOR APPROACH;  Surgeon: Melrose Nakayama, MD;  Location: [facility];  Service: Orthopedics;  Laterality: Left;    Social History   Tobacco Use  . Smoking status: Former Smoker    Quit date: 06/14/1984    Years since quitting: 34.8  . Smokeless tobacco: Never Used  Substance Use  Topics  . Alcohol use: No    Alcohol/week: 0.0 standard drinks  . Drug use: No     Medication list has been reviewed and updated.  Current Meds  Medication Sig  . aspirin 81 MG tablet Take 81 mg by mouth daily.  Marland Kitchen atorvastatin (LIPITOR) 40 MG tablet Take 1 tablet (40 mg total) by mouth daily.  .  Cholecalciferol (VITAMIN D) 2000 units CAPS Take 1 capsule (2,000 Units total) by mouth daily.  . Dulaglutide (TRULICITY) 0.75 MG/0.5ML SOPN INJECT 0.75 MG INTO THE SKIN ONCE A WEEK.  . DULoxetine (CYMBALTA) 60 MG capsule TAKE 1 CAPSULE (60 MG TOTAL) BY MOUTH DAILY.  Marland Kitchen glucose blood test strip 1 each by Other route daily. Use as instructed  . losartan (COZAAR) 100 MG tablet TAKE 1 TABLET BY MOUTH EVERY DAY  . metFORMIN (GLUCOPHAGE-XR) 500 MG 24 hr tablet Take 1 tablet (500 mg total) by mouth 2 (two) times a day.  . metoprolol succinate (TOPROL-XL) 100 MG 24 hr tablet TAKE 1/2 TABLET BY MOUTH TWICE A DAY WITH A MEAL  . Multiple Vitamins-Minerals (MULTIVITAMIN WITH MINERALS) tablet Take 1 tablet by mouth daily.  . valACYclovir (VALTREX) 1000 MG tablet TAKE 1 TABLET BY MOUTH TWICE A DAY    PHQ 2/9 Scores 03/25/2019 11/17/2018 08/18/2018 07/17/2018  PHQ - 2 Score 0 0 0 0  PHQ- 9 Score 0 - 0 -    BP Readings from Last 3 Encounters:  03/25/19 132/78  11/17/18 136/72  10/30/18 (!) 177/109    Physical Exam Vitals and nursing note reviewed.  Constitutional:      General: She is not in acute distress.    Appearance: Normal appearance. She is well-developed.  HENT:     Head: Normocephalic and atraumatic.  Neck:     Vascular: No carotid bruit.  Cardiovascular:     Rate and Rhythm: Normal rate and regular rhythm.     Pulses: Normal pulses.     Heart sounds: No murmur.  Pulmonary:     Effort: Pulmonary effort is normal. No respiratory distress.     Breath sounds: No wheezing or rhonchi.  Musculoskeletal:        General: Normal range of motion.     Cervical back: Normal range of motion.     Right lower leg: No edema.     Left lower leg: No edema.  Lymphadenopathy:     Cervical: No cervical adenopathy.  Skin:    General: Skin is warm and dry.     Capillary Refill: Capillary refill takes less than 2 seconds.     Findings: No rash.  Neurological:     General: No focal deficit present.      Mental Status: She is alert and oriented to person, place, and time.  Psychiatric:        Behavior: Behavior normal.        Thought Content: Thought content normal.     Wt Readings from Last 3 Encounters:  03/25/19 127 lb (57.6 kg)  11/17/18 125 lb (56.7 kg)  10/30/18 124 lb (56.2 kg)    BP 132/78   Pulse (!) 110   Temp 98.6 F (37 C) (Oral)   Ht 5' (1.524 m)   Wt 127 lb (57.6 kg)   LMP  (LMP Unknown)   SpO2 98%   BMI 24.80 kg/m   Assessment and Plan: 1. Type II diabetes mellitus with complication (HCC) Clinically stable by exam and report without s/s of hypoglycemia. DM complicated  by htn, lipids. Tolerating medications Trulicity and metformin well without side effects or other concerns. - Hemoglobin A1c  2. Essential hypertension Clinically stable exam with well controlled BP on metoprolol 50 mg bid and losartan 100 mg. Tolerating medications without side effects at this time. Pt to continue current regimen and low sodium diet; benefits of regular exercise as able discussed.    Partially dictated using Animal nutritionist. Any errors are unintentional.  Bari Edward, MD [facility]  03/25/2019

## 2019-03-26 LAB — HEMOGLOBIN A1C
Est. average glucose Bld gHb Est-mCnc: 148 mg/dL
Hgb A1c MFr Bld: 6.8 % — ABNORMAL HIGH (ref 4.8–5.6)

## 2019-04-29 ENCOUNTER — Other Ambulatory Visit: Payer: Self-pay | Admitting: Internal Medicine

## 2019-05-08 LAB — HM DIABETES EYE EXAM

## 2019-05-12 ENCOUNTER — Encounter: Payer: Self-pay | Admitting: Internal Medicine

## 2019-05-30 ENCOUNTER — Other Ambulatory Visit: Payer: Self-pay | Admitting: Internal Medicine

## 2019-06-04 ENCOUNTER — Ambulatory Visit: Payer: 59 | Attending: Internal Medicine

## 2019-06-04 DIAGNOSIS — Z23 Encounter for immunization: Secondary | ICD-10-CM

## 2019-06-04 NOTE — Progress Notes (Signed)
   Covid-19 Vaccination Clinic  Name:  MAITE BURLISON    MRN: [id number] DOB: 1960-06-19  06/04/2019  Ms. Patino was observed post Covid-19 immunization for 15 minutes without incident. She was provided with Vaccine Information Sheet and instruction to access the V-Safe system.   Ms. Shrestha was instructed to call 911 with any severe reactions post vaccine: Marland Kitchen Difficulty breathing  . Swelling of face and throat  . A fast heartbeat  . A bad rash all over body  . Dizziness and weakness   Immunizations Administered    Name Date Dose VIS Date Route   Pfizer COVID-19 Vaccine 06/04/2019  8:23 AM 0.3 mL 04/08/2018 Intramuscular   Manufacturer: [employer], [employer]   Lot: [id number]   NDC: [address]-9

## 2019-06-26 ENCOUNTER — Other Ambulatory Visit: Payer: Self-pay | Admitting: Internal Medicine

## 2019-06-26 DIAGNOSIS — E118 Type 2 diabetes mellitus with unspecified complications: Secondary | ICD-10-CM

## 2019-06-28 ENCOUNTER — Ambulatory Visit
Admission: EM | Admit: 2019-06-28 | Discharge: 2019-06-28 | Disposition: A | Discharge status: Home / Self Care | Payer: 59 | Attending: Family Medicine | Admitting: Family Medicine

## 2019-06-28 ENCOUNTER — Other Ambulatory Visit: Payer: Self-pay

## 2019-06-28 ENCOUNTER — Encounter: Payer: Self-pay | Admitting: Emergency Medicine

## 2019-06-28 DIAGNOSIS — L03221 Cellulitis of neck: Secondary | ICD-10-CM

## 2019-06-28 DIAGNOSIS — W57XXXA Bitten or stung by nonvenomous insect and other nonvenomous arthropods, initial encounter: Secondary | ICD-10-CM

## 2019-06-28 MED ORDER — DOXYCYCLINE HYCLATE 100 MG PO TABS: 100.0000 mg | ORAL_TABLET | Freq: Two times a day (BID) | ORAL | 0 refills | Status: DC

## 2019-06-28 NOTE — ED Triage Notes (Signed)
Patient states that she tried removing a tick from her neck yesterday but is not sure if she got it all.  Patient denies fevers.

## 2019-06-28 NOTE — ED Provider Notes (Addendum)
[facility]    CSN: [id number] Arrival date & time: 06/28/19  0856      History   Chief Complaint Chief Complaint  Patient presents with  . Insect Bite    tick    HPI Tracy Bryan is a 59 y.o. female.   59 yo female with a c/o tick bite on her neck yesterday. States tick was embedded and husband removed most of it, however thinks he may not have extracted the tick completely. Patient c/o slight pain and itching to the area. Denies any fevers.      Past Medical History:  Diagnosis Date  . Arthritis   . Diabetes mellitus without complication (HCC)    Type 2  . Disseminated intravascular coagulation (HCC) 1999   After child birth  . GERD (gastroesophageal reflux disease)   . Herpes simplex   . Hypercholesteremia   . Hypertension   . Migraines   . Pneumonia   . PONV (postoperative nausea and vomiting)     Patient Active Problem List   Diagnosis Date Noted  . Bilateral carpal tunnel syndrome 11/07/2016  . Herpes simplex infection 11/23/2015  . Fibromyalgia 03/11/2015  . Menopause syndrome 02/17/2015  . Esophageal reflux 02/17/2015  . Overweight (BMI 25.0-29.9) 02/17/2015  . Vitamin D deficiency 02/17/2015  . Hyperlipidemia associated with type 2 diabetes mellitus (HCC) 07/28/2014  . Essential hypertension 07/28/2014  . Type II diabetes mellitus with complication (HCC) 05/18/2014    Past Surgical History:  Procedure Laterality Date  . ABDOMINAL HYSTERECTOMY    . CERVICAL DISC SURGERY    . COLONOSCOPY  07/22/2013  . DILATION AND CURETTAGE OF UTERUS  1996  . HERNIA REPAIR  2000   Umblical  . HIP ARTHROPLASTY Right   . TOTAL HIP ARTHROPLASTY Left 05/18/2014   Procedure: LEFT TOTAL HIP ARTHROPLASTY ANTERIOR APPROACH;  Surgeon: Marcene Corning, MD;  Location: [facility];  Service: Orthopedics;  Laterality: Left;    OB History   No obstetric history on file.      Home Medications    Prior to Admission medications   Medication Sig Start Date  End Date Taking? Authorizing Provider  aspirin 81 MG tablet Take 81 mg by mouth daily.   Yes [provider]  Cholecalciferol (VITAMIN D) 2000 units CAPS Take 1 capsule (2,000 Units total) by mouth daily. 02/17/15  Yes Plonk, Chrissie Noa, MD  Dulaglutide (TRULICITY) 0.75 MG/0.5ML SOPN INJECT 0.75 MG INTO THE SKIN ONCE A WEEK. 11/17/18  Yes Reubin Milan, MD  DULoxetine (CYMBALTA) 60 MG capsule TAKE 1 CAPSULE BY MOUTH EVERY DAY 04/29/19  Yes Reubin Milan, MD  losartan (COZAAR) 100 MG tablet TAKE 1 TABLET BY MOUTH EVERY DAY 02/09/19  Yes Reubin Milan, MD  metFORMIN (GLUCOPHAGE-XR) 500 MG 24 hr tablet TAKE 1 TABLET BY MOUTH TWICE A DAY 06/26/19  Yes Reubin Milan, MD  metoprolol succinate (TOPROL-XL) 100 MG 24 hr tablet TAKE 1/2 TABLET BY MOUTH TWICE A DAY WITH A MEAL 02/16/19  Yes Reubin Milan, MD  Multiple Vitamins-Minerals (MULTIVITAMIN WITH MINERALS) tablet Take 1 tablet by mouth daily.   Yes [provider]  atorvastatin (LIPITOR) 40 MG tablet TAKE 1 TABLET BY MOUTH EVERY DAY 08/08/19   Reubin Milan, MD  glucose blood test strip 1 each by Other route daily. Use as instructed    [provider]  lansoprazole (PREVACID) 15 MG capsule Take 15 mg by mouth daily at 12 noon.    [provider]  valACYclovir (VALTREX) 1000 MG tablet TAKE 1 TABLET BY MOUTH TWICE A DAY Patient taking differently: PRN 02/16/19   Glean Hess, MD    Family History Family History  Problem Relation Age of Onset  . Hypertension Mother   . Stroke Mother   . Hyperlipidemia Mother   . Heart disease Mother        heart attack  . Diabetes Father   . Cancer Father        lung  . Diabetes Sister   . Hyperlipidemia Sister   . Stroke Paternal Grandfather   . Hypertension Paternal Grandfather   . Stroke Maternal Grandfather   . Alzheimer's disease Paternal Grandmother   . Diabetes Sister   . Cancer Sister        breast  . Breast cancer Sister 54  . Breast cancer  Paternal Aunt     Social History Social History   Tobacco Use  . Smoking status: Former Smoker    Quit date: 06/14/1984    Years since quitting: 35.2  . Smokeless tobacco: Never Used  Vaping Use  . Vaping Use: Never used  Substance Use Topics  . Alcohol use: No    Alcohol/week: 0.0 standard drinks  . Drug use: No     Allergies   Rabeprazole sodium, Augmentin [amoxicillin-pot clavulanate], Benazepril hcl, Penicillins, and Jardiance [empagliflozin]   Review of Systems Review of Systems   Physical Exam Triage Vital Signs ED Triage Vitals  Enc Vitals Group     BP 06/28/19 0907 (!) 155/93     Pulse Rate 06/28/19 0907 73     Resp 06/28/19 0907 14     Temp 06/28/19 0907 98.1 F (36.7 C)     Temp Source 06/28/19 0907 Oral     SpO2 06/28/19 0907 100 %     Weight 06/28/19 0904 124 lb (56.2 kg)     Height 06/28/19 0904 5' (1.524 m)     Head Circumference --      Peak Flow --      Pain Score 06/28/19 0904 0     Pain Loc --      Pain Edu? --      Excl. in [place]? --    No data found.  Updated Vital Signs BP (!) 155/93 (BP Location: Right Arm)   Pulse 73   Temp 98.1 F (36.7 C) (Oral)   Resp 14   Ht 5' (1.524 m)   Wt 56.2 kg   LMP  (LMP Unknown)   SpO2 100%   BMI 24.22 kg/m   Visual Acuity Right Eye Distance:   Left Eye Distance:   Bilateral Distance:    Right Eye Near:   Left Eye Near:    Bilateral Near:     Physical Exam Vitals and nursing note reviewed.  Constitutional:      General: She is not in acute distress.    Appearance: She is not toxic-appearing or diaphoretic.  Skin:    Comments: Pinpoint puncture wound noted on right posterior neck area with skin abrasion noted and mild erythema; no tick (foreing body) components seen externally  Neurological:     Mental Status: She is alert.      UC Treatments / Results  Labs (all labs ordered are listed, but only abnormal results are displayed) Labs Reviewed - No data to  display  EKG   Radiology No results found.  Procedures Procedures (including critical care time)  Medications Ordered in UC Medications -  No data to display  Initial Impression / Assessment and Plan / UC Course  I have reviewed the triage vital signs and the nursing notes.  Pertinent labs & imaging results that were available during my care of the patient were reviewed by me and considered in my medical decision making (see chart for details).      Final Clinical Impressions(s) / UC Diagnoses   Final diagnoses:  Cellulitis, neck  Tick bite, initial encounter     Discharge Instructions     Warm compresses to area    ED Prescriptions    Medication Sig Dispense Auth. Provider   doxycycline (VIBRA-TABS) 100 MG tablet Take 1 tablet (100 mg total) by mouth 2 (two) times daily. 14 tablet Amarianna Abplanalp, Pamala Hurry, MD     1. diagnosis reviewed with patient 2. rx as per orders above; reviewed possible side effects, interactions, risks and benefits  3. Follow-up prn if symptoms worsen or don't improve  PDMP not reviewed this encounter.   Payton Mccallum, MD 06/28/19 1059    Payton Mccallum, MD 08/29/19 [phone]

## 2019-06-28 NOTE — Discharge Instructions (Addendum)
Warm compresses to area °

## 2019-06-30 ENCOUNTER — Ambulatory Visit: Payer: 59 | Attending: Internal Medicine

## 2019-06-30 DIAGNOSIS — Z23 Encounter for immunization: Secondary | ICD-10-CM

## 2019-06-30 NOTE — Progress Notes (Signed)
   Covid-19 Vaccination Clinic  Name:  Tracy Bryan    MRN: [id number] DOB: 13-Jan-1961  06/30/2019  Ms. Pro was observed post Covid-19 immunization for 15 minutes without incident. She was provided with Vaccine Information Sheet and instruction to access the V-Safe system.   [name] was instructed to call 911 with any severe reactions post vaccine: Marland Kitchen Difficulty breathing  . Swelling of face and throat  . A fast heartbeat  . A bad rash all over body  . Dizziness and weakness   Immunizations Administered    Name Date Dose VIS Date Route   Pfizer COVID-19 Vaccine 06/30/2019 10:00 AM 0.3 mL 04/08/2018 Intramuscular   Manufacturer: [employer], [employer]   Lot: [id number]   NDC: [address]-5

## 2019-07-21 ENCOUNTER — Encounter: Payer: Self-pay | Admitting: Internal Medicine

## 2019-07-21 ENCOUNTER — Ambulatory Visit (INDEPENDENT_AMBULATORY_CARE_PROVIDER_SITE_OTHER): Payer: 59 | Admitting: Internal Medicine

## 2019-07-21 ENCOUNTER — Other Ambulatory Visit: Payer: Self-pay

## 2019-07-21 VITALS — BP 134/80 | HR 71 | Temp 98.2°F | Resp 14 | Ht 60.0 in | Wt 125.0 lb

## 2019-07-21 DIAGNOSIS — Z1211 Encounter for screening for malignant neoplasm of colon: Secondary | ICD-10-CM | POA: Diagnosis not present

## 2019-07-21 DIAGNOSIS — E1169 Type 2 diabetes mellitus with other specified complication: Secondary | ICD-10-CM | POA: Diagnosis not present

## 2019-07-21 DIAGNOSIS — Z0000 Encounter for general adult medical examination without abnormal findings: Secondary | ICD-10-CM

## 2019-07-21 DIAGNOSIS — E785 Hyperlipidemia, unspecified: Secondary | ICD-10-CM

## 2019-07-21 DIAGNOSIS — Z1231 Encounter for screening mammogram for malignant neoplasm of breast: Secondary | ICD-10-CM

## 2019-07-21 DIAGNOSIS — I10 Essential (primary) hypertension: Secondary | ICD-10-CM

## 2019-07-21 DIAGNOSIS — E118 Type 2 diabetes mellitus with unspecified complications: Secondary | ICD-10-CM

## 2019-07-21 LAB — POCT URINALYSIS DIPSTICK
Bilirubin, UA: NEGATIVE
Blood, UA: NEGATIVE
Glucose, UA: NEGATIVE
Ketones, UA: NEGATIVE
Leukocytes, UA: NEGATIVE
Nitrite, UA: NEGATIVE
Protein, UA: POSITIVE — AB
Spec Grav, UA: 1.015 (ref 1.010–1.025)
Urobilinogen, UA: 0.2 E.U./dL
pH, UA: 6 (ref 5.0–8.0)

## 2019-07-21 NOTE — Progress Notes (Signed)
Date:  07/21/2019   Name:  Tracy Bryan   DOB:  [birth year]   MRN:  [id number]   Chief Complaint: Annual Exam (Breast Exam. No pap. ) Tracy Bryan is a 59 y.o. female who presents today for her Complete Annual Exam. She feels well. She reports exercising/ walking 3 days weekly for 30-45 minutes at a time. She reports she is sleeping well. She denies breast complaints.  Recent mammogram was normal.  Mammogram  03/2019 Pap discontinued Colonoscopy ?  07/2013  [facility] Immunization History  Administered Date(s) Administered  . Influenza Split 11/07/2016  . Influenza,inj,Quad PF,6+ Mos 11/28/2017, 11/17/2018  . Influenza-Unspecified 11/20/2013, 11/13/2014  . PFIZER SARS-COV-2 Vaccination 06/04/2019, 06/30/2019  . Pneumococcal Polysaccharide-23 12/20/2010  . Tdap 11/20/2013, 07/30/2016    Diabetes She presents for her follow-up diabetic visit. She has type 2 diabetes mellitus. Her disease course has been stable. Pertinent negatives for diabetes include no chest pain. Current diabetic treatments: metformin and Trulicity. She is compliant with treatment most of the time. Her weight is stable. She monitors blood glucose at home 1-2 x per day. Her breakfast blood glucose is taken between 7-8 am. Her breakfast blood glucose range is generally 130-140 mg/dl. An ACE inhibitor/angiotensin II receptor blocker is being taken. Eye exam is current.  Hypertension This is a chronic problem. The problem is controlled. Pertinent negatives include no chest pain or palpitations. Past treatments include beta blockers and angiotensin blockers. The current treatment provides significant improvement.    Lab Results  Component Value Date   CREATININE 0.70 12/31/2018   BUN 13 07/17/2018   NA 141 07/17/2018   K 4.9 07/17/2018   CL 101 07/17/2018   CO2 23 07/17/2018   Lab Results  Component Value Date   CHOL 177 07/17/2018   HDL 46 07/17/2018   LDLCALC 56 07/17/2018   TRIG 375  (H) 07/17/2018   CHOLHDL 3.8 07/17/2018   Lab Results  Component Value Date   TSH 1.430 07/17/2018   Lab Results  Component Value Date   HGBA1C 6.8 (H) 03/25/2019   Lab Results  Component Value Date   WBC 6.7 07/17/2018   HGB 14.2 07/17/2018   HCT 42.7 07/17/2018   MCV 89 07/17/2018   PLT 307 07/17/2018   Lab Results  Component Value Date   ALT 30 07/17/2018   AST 22 07/17/2018   ALKPHOS 64 07/17/2018   BILITOT 0.3 07/17/2018     Review of Systems  Constitutional: Negative for activity change.  HENT: Negative for ear pain, sinus pressure and trouble swallowing.   Eyes: Negative for visual disturbance.  Respiratory: Positive for cough (tickle in throat- not a new problem).   Cardiovascular: Negative for chest pain, palpitations and leg swelling.  Gastrointestinal: Positive for diarrhea (on and off- Feels metformin and Trulicity is the cause. ). Negative for abdominal pain, constipation, nausea, rectal pain and vomiting.  Genitourinary: Negative for pelvic pain.  Musculoskeletal: Negative for joint swelling and myalgias.  Skin: Positive for rash (tick bite- Seen UC. Wants to follow up on bump on right side of neck.).  Psychiatric/Behavioral: Negative for behavioral problems.    Patient Active Problem List   Diagnosis Date Noted  . Bilateral carpal tunnel syndrome 11/07/2016  . Herpes simplex infection 11/23/2015  . Fibromyalgia 03/11/2015  . Menopause syndrome 02/17/2015  . Esophageal reflux 02/17/2015  . Overweight (BMI 25.0-29.9) 02/17/2015  . Vitamin D deficiency 02/17/2015  . Hyperlipidemia associated with type 2 diabetes mellitus (  HCC) 07/28/2014  . Essential hypertension 07/28/2014  . Type II diabetes mellitus with complication (HCC) 05/18/2014    Allergies  Allergen Reactions  . Rabeprazole Sodium     C Diff Side Affects  . Augmentin [Amoxicillin-Pot Clavulanate] Itching  . Benazepril Hcl Cough  . Penicillins Other (See Comments)  . Jardiance  [Empagliflozin] Rash    Recurrent yeast vaginitis    Past Surgical History:  Procedure Laterality Date  . ABDOMINAL HYSTERECTOMY    . CERVICAL DISC SURGERY    . COLONOSCOPY  07/22/2013  . DILATION AND CURETTAGE OF UTERUS  1996  . HERNIA REPAIR  2000   Umblical  . HIP ARTHROPLASTY Right   . TOTAL HIP ARTHROPLASTY Left 05/18/2014   Procedure: LEFT TOTAL HIP ARTHROPLASTY ANTERIOR APPROACH;  Surgeon: Marcene Corning, MD;  Location: [facility];  Service: Orthopedics;  Laterality: Left;    Social History   Tobacco Use  . Smoking status: Former Smoker    Quit date: 06/14/1984    Years since quitting: 35.1  . Smokeless tobacco: Never Used  Substance Use Topics  . Alcohol use: No    Alcohol/week: 0.0 standard drinks  . Drug use: No     Medication list has been reviewed and updated.  Current Meds  Medication Sig  . aspirin 81 MG tablet Take 81 mg by mouth daily.  Marland Kitchen atorvastatin (LIPITOR) 40 MG tablet Take 1 tablet (40 mg total) by mouth daily.  . Cholecalciferol (VITAMIN D) 2000 units CAPS Take 1 capsule (2,000 Units total) by mouth daily.  . Dulaglutide (TRULICITY) 0.75 MG/0.5ML SOPN INJECT 0.75 MG INTO THE SKIN ONCE A WEEK.  . DULoxetine (CYMBALTA) 60 MG capsule TAKE 1 CAPSULE BY MOUTH EVERY DAY  . glucose blood test strip 1 each by Other route daily. Use as instructed  . lansoprazole (PREVACID) 15 MG capsule Take 15 mg by mouth daily at 12 noon.  Marland Kitchen losartan (COZAAR) 100 MG tablet TAKE 1 TABLET BY MOUTH EVERY DAY  . metFORMIN (GLUCOPHAGE-XR) 500 MG 24 hr tablet TAKE 1 TABLET BY MOUTH TWICE A DAY  . metoprolol succinate (TOPROL-XL) 100 MG 24 hr tablet TAKE 1/2 TABLET BY MOUTH TWICE A DAY WITH A MEAL  . Multiple Vitamins-Minerals (MULTIVITAMIN WITH MINERALS) tablet Take 1 tablet by mouth daily.  . valACYclovir (VALTREX) 1000 MG tablet TAKE 1 TABLET BY MOUTH TWICE A DAY (Patient taking differently: PRN)    PHQ 2/9 Scores 07/21/2019 03/25/2019 11/17/2018 08/18/2018  PHQ - 2 Score 0 0 0 0  PHQ-  9 Score 0 0 - 0    BP Readings from Last 3 Encounters:  07/21/19 134/80  06/28/19 (!) 155/93  03/25/19 132/78    Physical Exam Vitals and nursing note reviewed.  Constitutional:      General: She is not in acute distress.    Appearance: She is well-developed.  HENT:     Head: Normocephalic and atraumatic.     Right Ear: Tympanic membrane and ear canal normal.     Left Ear: Tympanic membrane and ear canal normal.     Nose:     Right Sinus: No maxillary sinus tenderness.     Left Sinus: No maxillary sinus tenderness.  Eyes:     General: No scleral icterus.       Right eye: No discharge.        Left eye: No discharge.     Conjunctiva/sclera: Conjunctivae normal.  Neck:     Thyroid: No thyromegaly.     Vascular:  No carotid bruit.  Cardiovascular:     Rate and Rhythm: Normal rate and regular rhythm.     Pulses: Normal pulses.     Heart sounds: Normal heart sounds.  Pulmonary:     Effort: Pulmonary effort is normal. No respiratory distress.     Breath sounds: No wheezing.  Chest:     Breasts:        Right: No mass, nipple discharge, skin change or tenderness.        Left: No mass, nipple discharge, skin change or tenderness.  Abdominal:     General: Bowel sounds are normal.     Palpations: Abdomen is soft.     Tenderness: There is no abdominal tenderness.  Musculoskeletal:        General: Normal range of motion.     Cervical back: Normal range of motion. No erythema.  Lymphadenopathy:     Cervical: No cervical adenopathy.  Skin:    General: Skin is warm and dry.     Capillary Refill: Capillary refill takes less than 2 seconds.     Findings: No rash.          Comments: Tick bite site healing - no target lesion, drainage or swelling  Neurological:     General: No focal deficit present.     Mental Status: She is alert and oriented to person, place, and time.     Cranial Nerves: No cranial nerve deficit.     Sensory: No sensory deficit.     Deep Tendon Reflexes:  Reflexes are normal and symmetric.  Psychiatric:        Attention and Perception: Attention normal.        Mood and Affect: Mood normal.        Speech: Speech normal.        Behavior: Behavior normal.     Wt Readings from Last 3 Encounters:  07/21/19 125 lb (56.7 kg)  06/28/19 124 lb (56.2 kg)  03/25/19 127 lb (57.6 kg)    BP 134/80 (BP Location: Right Arm, Patient Position: Sitting, Cuff Size: Normal)   Pulse 71   Temp 98.2 F (36.8 C) (Oral)   Resp 14   Ht 5' (1.524 m)   Wt 125 lb (56.7 kg)   LMP  (LMP Unknown)   SpO2 97%   BMI 24.41 kg/m   Assessment and Plan: 1. Annual physical exam Normal exam Continue exercise and healthy diet - POCT urinalysis dipstick  2. Encounter for screening mammogram for breast cancer Due next year  3. Colon cancer screening Will try to obtain report from previous exam  4. Type II diabetes mellitus with complication (HCC) Clinically stable by exam and report without s/s of hypoglycemia. DM complicated by HTN. Tolerating medications well without side effects or other concerns. Occasional loose stools is more likely related to certain foods.  Recommend pt keep a food log to correlate - Hemoglobin A1c - TSH  5. Essential hypertension Clinically stable exam with well controlled BP on losartan and metoprolol. Tolerating medications without side effects at this time. Pt to continue current regimen and low sodium diet; benefits of regular exercise as able discussed. - CBC with Differential/Platelet - Comprehensive metabolic panel  6. Hyperlipidemia associated with type 2 diabetes mellitus ([place]) On appropriate high intensity statin without side effects. Last LDL at goal. - Lipid panel   Partially dictated using Dragon software. Any errors are unintentional.  Halina Maidens, MD [facility]  07/21/2019

## 2019-07-22 LAB — TSH: TSH: 1.81 u[IU]/mL (ref 0.450–4.500)

## 2019-07-22 LAB — COMPREHENSIVE METABOLIC PANEL
ALT: 31 IU/L (ref 0–32)
AST: 26 IU/L (ref 0–40)
Albumin/Globulin Ratio: 1.7 (ref 1.2–2.2)
Albumin: 4.5 g/dL (ref 3.8–4.8)
Alkaline Phosphatase: 65 IU/L (ref 48–121)
BUN/Creatinine Ratio: 13 (ref 12–28)
BUN: 11 mg/dL (ref 8–27)
Bilirubin Total: 0.3 mg/dL (ref 0.0–1.2)
CO2: 24 mmol/L (ref 20–29)
Calcium: 9.7 mg/dL (ref 8.7–10.3)
Chloride: 101 mmol/L (ref 96–106)
Creatinine, Ser: 0.82 mg/dL (ref 0.57–1.00)
GFR calc Af Amer: 89 mL/min/{1.73_m2} (ref >59)
GFR calc non Af Amer: 77 mL/min/{1.73_m2} (ref >59)
Globulin, Total: 2.6 g/dL (ref 1.5–4.5)
Glucose: 130 mg/dL — ABNORMAL HIGH (ref 65–99)
Potassium: 4.7 mmol/L (ref 3.5–5.2)
Sodium: 137 mmol/L (ref 134–144)
Total Protein: 7.1 g/dL (ref 6.0–8.5)

## 2019-07-22 LAB — CBC WITH DIFFERENTIAL/PLATELET
Basophils Absolute: 0.1 10*3/uL (ref 0.0–0.2)
Basos: 2 %
EOS (ABSOLUTE): 0.2 10*3/uL (ref 0.0–0.4)
Eos: 4 %
Hematocrit: 42.3 % (ref 34.0–46.6)
Hemoglobin: 14 g/dL (ref 11.1–15.9)
Immature Grans (Abs): 0 10*3/uL (ref 0.0–0.1)
Immature Granulocytes: 1 %
Lymphocytes Absolute: 2.1 10*3/uL (ref 0.7–3.1)
Lymphs: 33 %
MCH: 30 pg (ref 26.6–33.0)
MCHC: 33.1 g/dL (ref 31.5–35.7)
MCV: 91 fL (ref 79–97)
Monocytes Absolute: 0.6 10*3/uL (ref 0.1–0.9)
Monocytes: 10 %
Neutrophils Absolute: 3.2 10*3/uL (ref 1.4–7.0)
Neutrophils: 50 %
Platelets: 278 10*3/uL (ref 150–450)
RBC: 4.66 x10E6/uL (ref 3.77–5.28)
RDW: 13.1 % (ref 11.7–15.4)
WBC: 6.2 10*3/uL (ref 3.4–10.8)

## 2019-07-22 LAB — HEMOGLOBIN A1C
Est. average glucose Bld gHb Est-mCnc: 140 mg/dL
Hgb A1c MFr Bld: 6.5 % — ABNORMAL HIGH (ref 4.8–5.6)

## 2019-07-22 LAB — LIPID PANEL
Chol/HDL Ratio: 3.9 ratio (ref 0.0–4.4)
Cholesterol, Total: 197 mg/dL (ref 100–199)
HDL: 51 mg/dL (ref >39)
LDL Chol Calc (NIH): 64 mg/dL (ref 0–99)
Triglycerides: 537 mg/dL — ABNORMAL HIGH (ref 0–149)
VLDL Cholesterol Cal: 82 mg/dL — ABNORMAL HIGH (ref 5–40)

## 2019-08-01 ENCOUNTER — Other Ambulatory Visit: Payer: Self-pay | Admitting: Internal Medicine

## 2019-08-01 DIAGNOSIS — E118 Type 2 diabetes mellitus with unspecified complications: Secondary | ICD-10-CM

## 2019-08-08 ENCOUNTER — Other Ambulatory Visit: Payer: Self-pay | Admitting: Internal Medicine

## 2019-08-08 DIAGNOSIS — E118 Type 2 diabetes mellitus with unspecified complications: Secondary | ICD-10-CM

## 2019-08-08 DIAGNOSIS — E785 Hyperlipidemia, unspecified: Secondary | ICD-10-CM

## 2019-08-08 NOTE — Telephone Encounter (Signed)
Requested Prescriptions  Pending Prescriptions Disp Refills  . atorvastatin (LIPITOR) 40 MG tablet [Pharmacy Med Name: ATORVASTATIN 40 MG TABLET] 90 tablet 3    Sig: TAKE 1 TABLET BY MOUTH EVERY DAY     Cardiovascular:  Antilipid - Statins Failed - 08/08/2019  2:11 PM      Failed - LDL in normal range and within 360 days    LDL Chol Calc (NIH)  Date Value Ref Range Status  07/21/2019 64 0 - 99 mg/dL Final         Failed - Triglycerides in normal range and within 360 days    Triglycerides  Date Value Ref Range Status  07/21/2019 537 (H) 0 - 149 mg/dL Final   Triglycerides Piccolo,Waived  Date Value Ref Range Status  07/28/2014 CANCELED      Comment:    Test not performed  Result canceled by the ancillary          Passed - Total Cholesterol in normal range and within 360 days    Cholesterol, Total  Date Value Ref Range Status  07/21/2019 197 100 - 199 mg/dL Final   Cholesterol Piccolo, Waived  Date Value Ref Range Status  07/28/2014 CANCELED mg/dL     Comment:    No green top heparin tube submitted.                         Desirable                <200                         Borderline High      200- 239                         High                     >239  Result canceled by the ancillary          Passed - HDL in normal range and within 360 days    HDL  Date Value Ref Range Status  07/21/2019 51 >39 mg/dL Final         Passed - Patient is not pregnant      Passed - Valid encounter within last 12 months    Recent Outpatient Visits          2 weeks ago Annual physical exam   [facility] Glean Hess, [facility]   4 months ago Type II diabetes mellitus with complication [facility])   [facility] Glean Hess, MD   8 months ago Type II diabetes mellitus with complication [facility])   [facility] Glean Hess, MD   11 months ago Acute cystitis without hematuria   [facility] Glean Hess, MD   1 year ago Annual  physical exam   [facility] Glean Hess, MD      Future Appointments            In 3 months Army Melia Jesse Sans, MD [facility] [place]   In 11 months Glean Hess, MD [facility]           . metFORMIN (GLUCOPHAGE-XR) 500 MG 24 hr tablet [Pharmacy Med Name: METFORMIN HCL ER 500 MG TABLET] 180 tablet     Sig: TAKE 1 TABLET BY MOUTH TWICE  A DAY     Endocrinology:  Diabetes - Biguanides Passed - 08/08/2019  2:11 PM      Passed - Cr in normal range and within 360 days    Creatinine  Date Value Ref Range Status  07/08/2013 0.92 0.60 - 1.30 mg/dL Final   Creatinine, Ser  Date Value Ref Range Status  07/21/2019 0.82 0.57 - 1.00 mg/dL Final         Passed - HBA1C is between 0 and 7.9 and within 180 days    HB A1C (BAYER DCA - WAIVED)  Date Value Ref Range Status  07/28/2014 6.9 <7.0 % Final    Comment:                                          Diabetic Adult            <7.0                                       Healthy Adult        4.3 - 5.7                                                           (DCCT/NGSP) American Diabetes Association's Summary of Glycemic Recommendations for Adults with Diabetes: Hemoglobin A1c <7.0%. More stringent glycemic goals (A1c <6.0%) may further reduce complications at the cost of increased risk of hypoglycemia.    Hgb A1c MFr Bld  Date Value Ref Range Status  07/21/2019 6.5 (H) 4.8 - 5.6 % Final    Comment:             Prediabetes: 5.7 - 6.4          Diabetes: >6.4          Glycemic control for adults with diabetes: <7.0          Passed - eGFR in normal range and within 360 days    EGFR (African American)  Date Value Ref Range Status  07/08/2013 >60  Final   GFR calc Af Amer  Date Value Ref Range Status  07/21/2019 89 >59 mL/min/1.73 Final    Comment:    **[facility] currently reports eGFR in compliance with the current**   recommendations of the [employer]. [facility] will    update reporting as new guidelines are published from the NKF-ASN   Task force.    EGFR (Non-African Amer.)  Date Value Ref Range Status  07/08/2013 >60  Final    Comment:    eGFR values <26m/min/1.73 m2 may be an indication of chronic kidney disease (CKD). Calculated eGFR is useful in patients with stable renal function. The eGFR calculation will not be reliable in acutely ill patients when serum creatinine is changing rapidly. It is not useful in  patients on dialysis. The eGFR calculation may not be applicable to patients at the low and high extremes of body sizes, pregnant women, and vegetarians.    GFR calc non Af Amer  Date Value Ref Range Status  07/21/2019 77 >59 mL/min/1.73 Final         Passed -  Valid encounter within last 6 months    Recent Outpatient Visits          2 weeks ago Annual physical exam   [facility] Glean Hess, MD   4 months ago Type II diabetes mellitus with complication [facility])   [facility] Glean Hess, MD   8 months ago Type II diabetes mellitus with complication [facility])   [facility] Glean Hess, MD   11 months ago Acute cystitis without hematuria   [facility] Glean Hess, MD   1 year ago Annual physical exam   [facility] Glean Hess, MD      Future Appointments            In 3 months Army Melia Jesse Sans, MD [facility] [place]   In 11 months Army Melia Jesse Sans, MD [facility]

## 2019-11-10 ENCOUNTER — Encounter: Payer: Self-pay | Admitting: Internal Medicine

## 2019-11-10 ENCOUNTER — Ambulatory Visit: Payer: 59 | Admitting: Internal Medicine

## 2019-11-10 ENCOUNTER — Other Ambulatory Visit: Payer: Self-pay

## 2019-11-10 VITALS — BP 138/82 | HR 75 | Ht 60.0 in | Wt 124.0 lb

## 2019-11-10 DIAGNOSIS — E118 Type 2 diabetes mellitus with unspecified complications: Secondary | ICD-10-CM

## 2019-11-10 DIAGNOSIS — G479 Sleep disorder, unspecified: Secondary | ICD-10-CM

## 2019-11-10 DIAGNOSIS — I10 Essential (primary) hypertension: Secondary | ICD-10-CM | POA: Diagnosis not present

## 2019-11-10 DIAGNOSIS — Z23 Encounter for immunization: Secondary | ICD-10-CM

## 2019-11-10 MED ORDER — TRULICITY 0.75 MG/0.5ML ~~LOC~~ SOAJ: SUBCUTANEOUS | 3 refills | Status: DC

## 2019-11-10 NOTE — Progress Notes (Signed)
Date:  11/10/2019   Name:  Tracy Bryan   DOB:  1960/11/03   MRN:  [id number]   Chief Complaint: Diabetes (follow up), Hypertension (follow up), and Flu Vaccine (Reg dose. )  Diabetes She presents for her follow-up diabetic visit. She has type 2 diabetes mellitus. Her disease course has been stable. Pertinent negatives for hypoglycemia include no headaches or tremors. Pertinent negatives for diabetes include no chest pain, no fatigue, no polydipsia and no polyuria. There are no hypoglycemic complications. Symptoms are stable. Current diabetic treatments: metformin and Trulicity. She is compliant with treatment all of the time. An ACE inhibitor/angiotensin II receptor blocker is being taken.  Hypertension This is a chronic problem. The problem is controlled. Pertinent negatives include no chest pain, headaches, palpitations or shortness of breath. Past treatments include angiotensin blockers and beta blockers. The current treatment provides significant improvement.  Insomnia Primary symptoms: premature morning awakening.  The current episode started more than one month. The problem occurs nightly. The problem is unchanged. The symptoms are aggravated by anxiety (wakes up and can't stop thinking worrying about her kids, etc). PMH includes: chronic pain.    Lab Results  Component Value Date   CREATININE 0.82 07/21/2019   BUN 11 07/21/2019   NA 137 07/21/2019   K 4.7 07/21/2019   CL 101 07/21/2019   CO2 24 07/21/2019   Lab Results  Component Value Date   CHOL 197 07/21/2019   HDL 51 07/21/2019   LDLCALC 64 07/21/2019   TRIG 537 (H) 07/21/2019   CHOLHDL 3.9 07/21/2019   Lab Results  Component Value Date   TSH 1.810 07/21/2019   Lab Results  Component Value Date   HGBA1C 6.5 (H) 07/21/2019   Lab Results  Component Value Date   WBC 6.2 07/21/2019   HGB 14.0 07/21/2019   HCT 42.3 07/21/2019   MCV 91 07/21/2019   PLT 278 07/21/2019   Lab Results  Component Value  Date   ALT 31 07/21/2019   AST 26 07/21/2019   ALKPHOS 65 07/21/2019   BILITOT 0.3 07/21/2019     Review of Systems  Constitutional: Negative for appetite change, fatigue, fever and unexpected weight change.  HENT: Negative for tinnitus and trouble swallowing.   Eyes: Negative for visual disturbance.  Respiratory: Negative for cough, chest tightness and shortness of breath.   Cardiovascular: Negative for chest pain, palpitations and leg swelling.  Gastrointestinal: Negative for abdominal pain.  Endocrine: Negative for polydipsia and polyuria.  Genitourinary: Negative for dysuria and hematuria.  Musculoskeletal: Negative for arthralgias.  Neurological: Negative for tremors, numbness and headaches.  Psychiatric/Behavioral: Negative for dysphoric mood. The patient has insomnia.     Patient Active Problem List   Diagnosis Date Noted  . Bilateral carpal tunnel syndrome 11/07/2016  . Herpes simplex infection 11/23/2015  . Fibromyalgia 03/11/2015  . Menopause syndrome 02/17/2015  . Esophageal reflux 02/17/2015  . Vitamin D deficiency 02/17/2015  . Hyperlipidemia associated with type 2 diabetes mellitus (HCC) 07/28/2014  . Essential hypertension 07/28/2014  . Type II diabetes mellitus with complication (HCC) 05/18/2014    Allergies  Allergen Reactions  . Rabeprazole Sodium     C Diff Side Affects  . Augmentin [Amoxicillin-Pot Clavulanate] Itching  . Benazepril Hcl Cough  . Penicillins Other (See Comments)  . Jardiance [Empagliflozin] Rash    Recurrent yeast vaginitis    Past Surgical History:  Procedure Laterality Date  . ABDOMINAL HYSTERECTOMY    . CERVICAL DISC SURGERY    .  COLONOSCOPY  07/22/2013  . DILATION AND CURETTAGE OF UTERUS  1996  . HERNIA REPAIR  2000   Umblical  . HIP ARTHROPLASTY Right   . TOTAL HIP ARTHROPLASTY Left 05/18/2014   Procedure: LEFT TOTAL HIP ARTHROPLASTY ANTERIOR APPROACH;  Surgeon: Marcene Corning, MD;  Location: [facility] OR;  Service: Orthopedics;   Laterality: Left;    Social History   Tobacco Use  . Smoking status: Former Smoker    Quit date: 06/14/1984    Years since quitting: 35.4  . Smokeless tobacco: Never Used  Vaping Use  . Vaping Use: Never used  Substance Use Topics  . Alcohol use: No    Alcohol/week: 0.0 standard drinks  . Drug use: No     Medication list has been reviewed and updated.  Current Meds  Medication Sig  . aspirin 81 MG tablet Take 81 mg by mouth daily.  Marland Kitchen atorvastatin (LIPITOR) 40 MG tablet TAKE 1 TABLET BY MOUTH EVERY DAY  . Cholecalciferol (VITAMIN D) 2000 units CAPS Take 1 capsule (2,000 Units total) by mouth daily.  . Dulaglutide (TRULICITY) 0.75 MG/0.5ML SOPN INJECT 0.75 MG INTO THE SKIN ONCE A WEEK.  . DULoxetine (CYMBALTA) 60 MG capsule TAKE 1 CAPSULE BY MOUTH EVERY DAY  . glucose blood test strip 1 each by Other route daily. Use as instructed  . lansoprazole (PREVACID) 15 MG capsule Take 15 mg by mouth daily at 12 noon.  Marland Kitchen losartan (COZAAR) 100 MG tablet TAKE 1 TABLET BY MOUTH EVERY DAY  . metFORMIN (GLUCOPHAGE-XR) 500 MG 24 hr tablet TAKE 1 TABLET BY MOUTH TWICE A DAY  . metoprolol succinate (TOPROL-XL) 100 MG 24 hr tablet TAKE 1/2 TABLET BY MOUTH TWICE A DAY WITH A MEAL  . Multiple Vitamins-Minerals (MULTIVITAMIN WITH MINERALS) tablet Take 1 tablet by mouth daily.  . valACYclovir (VALTREX) 1000 MG tablet TAKE 1 TABLET BY MOUTH TWICE A DAY (Patient taking differently: PRN)    PHQ 2/9 Scores 11/10/2019 07/21/2019 03/25/2019 11/17/2018  PHQ - 2 Score 0 0 0 0  PHQ- 9 Score 4 0 0 -    GAD 7 : Generalized Anxiety Score 11/10/2019 07/21/2019  Nervous, Anxious, on Edge 2 0  Control/stop worrying 3 0  Worry too much - different things 3 0  Trouble relaxing 2 0  Restless 2 0  Easily annoyed or irritable 2 0  Afraid - awful might happen 0 0  Total GAD 7 Score 14 0  Anxiety Difficulty Somewhat difficult Not difficult at all    BP Readings from Last 3 Encounters:  11/10/19 138/82  07/21/19  134/80  06/28/19 (!) 155/93    Physical Exam Vitals and nursing note reviewed.  Constitutional:      General: She is not in acute distress.    Appearance: She is well-developed.  HENT:     Head: Normocephalic and atraumatic.  Pulmonary:     Effort: Pulmonary effort is normal. No respiratory distress.  Musculoskeletal:        General: Normal range of motion.  Skin:    General: Skin is warm and dry.     Findings: No rash.  Neurological:     Mental Status: She is alert and oriented to person, place, and time.  Psychiatric:        Behavior: Behavior normal.        Thought Content: Thought content normal.     Wt Readings from Last 3 Encounters:  11/10/19 124 lb (56.2 kg)  07/21/19 125 lb (56.7 kg)  06/28/19 124 lb (56.2 kg)    BP 138/82 (BP Location: Right Arm, Patient Position: Sitting, Cuff Size: Normal)   Pulse 75   Ht 5' (1.524 m)   Wt 124 lb (56.2 kg)   LMP  (LMP Unknown)   SpO2 99%   BMI 24.22 kg/m   Assessment and Plan: 1. Type II diabetes mellitus with complication (HCC) Clinically stable by exam and report without s/s of hypoglycemia. DM complicated by HTN. Tolerating medications well without side effects or other concerns. - Hemoglobin A1c - Dulaglutide (TRULICITY) 0.75 MG/0.5ML SOPN; INJECT 0.75 MG INTO THE SKIN ONCE A WEEK.  Dispense: 2 mL; Refill: 3  2. Essential hypertension Clinically stable exam with well controlled BP. Tolerating medications without side effects at this time. Pt to continue current regimen and low sodium diet; benefits of regular exercise as able discussed.  3. Need for immunization against influenza - Flu Vaccine QUAD 36+ mos IM  4. Sleep disorder Discussed good sleep hygiene as the first measure Follow up if worsening   Partially dictated using Dragon software. Any errors are unintentional.  Bari Edward, MD [facility]  11/10/2019

## 2019-11-11 LAB — HEMOGLOBIN A1C
Est. average glucose Bld gHb Est-mCnc: 146 mg/dL
Hgb A1c MFr Bld: 6.7 % — ABNORMAL HIGH (ref 4.8–5.6)

## 2019-11-17 ENCOUNTER — Other Ambulatory Visit: Payer: Self-pay | Admitting: Internal Medicine

## 2019-11-17 DIAGNOSIS — E118 Type 2 diabetes mellitus with unspecified complications: Secondary | ICD-10-CM

## 2020-01-31 ENCOUNTER — Other Ambulatory Visit: Payer: Self-pay | Admitting: Internal Medicine

## 2020-01-31 DIAGNOSIS — I10 Essential (primary) hypertension: Secondary | ICD-10-CM

## 2020-01-31 NOTE — Telephone Encounter (Signed)
Requested Prescriptions  Pending Prescriptions Disp Refills  . losartan (COZAAR) 100 MG tablet [Pharmacy Med Name: LOSARTAN POTASSIUM 100 MG TAB] 90 tablet 3    Sig: TAKE 1 TABLET BY MOUTH EVERY DAY     Cardiovascular:  Angiotensin Receptor Blockers Failed - 01/31/2020 11:18 AM      Failed - Cr in normal range and within 180 days    Creatinine  Date Value Ref Range Status  07/08/2013 0.92 0.60 - 1.30 mg/dL Final   Creatinine, Ser  Date Value Ref Range Status  07/21/2019 0.82 0.57 - 1.00 mg/dL Final         Failed - K in normal range and within 180 days    Potassium  Date Value Ref Range Status  07/21/2019 4.7 3.5 - 5.2 mmol/L Final  07/08/2013 3.5 3.5 - 5.1 mmol/L Final         Passed - Patient is not pregnant      Passed - Last BP in normal range    BP Readings from Last 1 Encounters:  11/10/19 138/82         Passed - Valid encounter within last 6 months    Recent Outpatient Visits          2 months ago Type II diabetes mellitus with complication [facility])   [facility] Reubin Milan, MD   6 months ago Annual physical exam   [facility] Reubin Milan, MD   10 months ago Type II diabetes mellitus with complication [facility])   [facility] Reubin Milan, MD   1 year ago Type II diabetes mellitus with complication [facility])   [facility] Reubin Milan, MD   1 year ago Acute cystitis without hematuria   [facility] Reubin Milan, MD      Future Appointments            In 3 weeks Judithann Graves Nyoka Cowden, MD [facility]   In 5 months Judithann Graves, Nyoka Cowden, MD [facility], [facility]

## 2020-02-07 ENCOUNTER — Other Ambulatory Visit: Payer: Self-pay | Admitting: Internal Medicine

## 2020-02-07 DIAGNOSIS — E118 Type 2 diabetes mellitus with unspecified complications: Secondary | ICD-10-CM

## 2020-02-22 ENCOUNTER — Ambulatory Visit: Payer: 59 | Admitting: Internal Medicine

## 2020-03-01 ENCOUNTER — Other Ambulatory Visit: Payer: Self-pay | Admitting: Internal Medicine

## 2020-03-01 DIAGNOSIS — Z1231 Encounter for screening mammogram for malignant neoplasm of breast: Secondary | ICD-10-CM

## 2020-03-02 ENCOUNTER — Ambulatory Visit: Payer: 59 | Admitting: Internal Medicine

## 2020-03-16 ENCOUNTER — Other Ambulatory Visit: Payer: Self-pay | Admitting: Internal Medicine

## 2020-03-16 ENCOUNTER — Telehealth: Payer: Self-pay

## 2020-03-16 NOTE — Telephone Encounter (Signed)
Completed PA on [email or web address] for Trulicity 0.75MG /0.5/ML pen-injectors.   Key: [id number]  PA was APPROVED today!  No dates were given in [place] approval. Will wait to see what dates they approved through from fax we receive by insurance.  Mariel Sleet, CMA (AAMA)

## 2020-03-23 ENCOUNTER — Ambulatory Visit
Admission: RE | Admit: 2020-03-23 | Discharge: 2020-03-23 | Disposition: A | Discharge status: Home / Self Care | Payer: Managed Care, Other (non HMO) | Source: Ambulatory Visit | Attending: Internal Medicine | Admitting: Internal Medicine

## 2020-03-23 ENCOUNTER — Other Ambulatory Visit: Payer: Self-pay

## 2020-03-23 DIAGNOSIS — Z1231 Encounter for screening mammogram for malignant neoplasm of breast: Secondary | ICD-10-CM | POA: Diagnosis present

## 2020-03-23 DIAGNOSIS — Z90710 Acquired absence of both cervix and uterus: Secondary | ICD-10-CM | POA: Insufficient documentation

## 2020-03-24 ENCOUNTER — Other Ambulatory Visit: Payer: Self-pay | Admitting: Internal Medicine

## 2020-03-24 DIAGNOSIS — E118 Type 2 diabetes mellitus with unspecified complications: Secondary | ICD-10-CM

## 2020-03-24 DIAGNOSIS — B009 Herpesviral infection, unspecified: Secondary | ICD-10-CM

## 2020-03-28 ENCOUNTER — Other Ambulatory Visit: Payer: Self-pay | Admitting: Internal Medicine

## 2020-03-28 DIAGNOSIS — R928 Other abnormal and inconclusive findings on diagnostic imaging of breast: Secondary | ICD-10-CM

## 2020-03-28 DIAGNOSIS — N6489 Other specified disorders of breast: Secondary | ICD-10-CM

## 2020-03-29 ENCOUNTER — Other Ambulatory Visit: Payer: Self-pay | Admitting: Internal Medicine

## 2020-03-29 DIAGNOSIS — R928 Other abnormal and inconclusive findings on diagnostic imaging of breast: Secondary | ICD-10-CM

## 2020-03-29 DIAGNOSIS — N6489 Other specified disorders of breast: Secondary | ICD-10-CM

## 2020-03-30 ENCOUNTER — Other Ambulatory Visit: Payer: Self-pay

## 2020-03-30 ENCOUNTER — Encounter: Payer: Self-pay | Admitting: Internal Medicine

## 2020-03-30 ENCOUNTER — Ambulatory Visit: Payer: Managed Care, Other (non HMO) | Admitting: Internal Medicine

## 2020-03-30 VITALS — BP 128/86 | HR 76 | Ht 60.0 in | Wt 114.0 lb

## 2020-03-30 DIAGNOSIS — G44209 Tension-type headache, unspecified, not intractable: Secondary | ICD-10-CM | POA: Diagnosis not present

## 2020-03-30 DIAGNOSIS — I10 Essential (primary) hypertension: Secondary | ICD-10-CM | POA: Diagnosis not present

## 2020-03-30 DIAGNOSIS — E118 Type 2 diabetes mellitus with unspecified complications: Secondary | ICD-10-CM | POA: Diagnosis not present

## 2020-03-30 LAB — POCT GLYCOSYLATED HEMOGLOBIN (HGB A1C): Hemoglobin A1C: 6.4 % — AB (ref 4.0–5.6)

## 2020-03-30 MED ORDER — METHOCARBAMOL 500 MG PO TABS
500.0000 mg | ORAL_TABLET | Freq: Two times a day (BID) | ORAL | 0 refills | Status: DC
Start: 2020-03-30 — End: 2020-05-02

## 2020-03-30 NOTE — Progress Notes (Signed)
Date:  03/30/2020   Name:  Tracy Bryan   DOB:  [birth year]   MRN:  [id number]   Chief Complaint: Hypertension and Diabetes  Diabetes She presents for her follow-up diabetic visit. She has type 2 diabetes mellitus. Her disease course has been stable. Hypoglycemia symptoms include headaches. Pertinent negatives for hypoglycemia include no tremors. Pertinent negatives for diabetes include no chest pain, no fatigue, no polydipsia, no polyuria, no visual change and no weakness. There are no diabetic complications. Current diabetic treatments: metformin and Trulicity. She is compliant with treatment all of the time. An ACE inhibitor/angiotensin II receptor blocker is being taken.  Hypertension This is a chronic problem. The problem is controlled. Associated symptoms include headaches and neck pain. Pertinent negatives include no chest pain, palpitations or shortness of breath. Past treatments include angiotensin blockers and beta blockers. The current treatment provides significant improvement.  Headache  This is a new problem. The current episode started more than 1 month ago. The problem occurs intermittently (several times per week). The problem has been unchanged. The pain is located in the vertex region. The pain does not radiate (from the neck and shoulders). The quality of the pain is described as aching and band-like. Associated symptoms include neck pain and scalp tenderness. Pertinent negatives include no abdominal pain, coughing, fever, muscle aches, numbness, tinnitus, visual change, vomiting or weakness. 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 Treatments tried: heat and topical rubs. The treatment provided mild relief. Her past medical history is significant for hypertension.    Lab Results  Component Value Date   CREATININE 0.82 07/21/2019   BUN 11 07/21/2019   NA 137 07/21/2019   K 4.7 07/21/2019   CL 101 07/21/2019   CO2 24  07/21/2019   Lab Results  Component Value Date   CHOL 197 07/21/2019   HDL 51 07/21/2019   LDLCALC 64 07/21/2019   TRIG 537 (H) 07/21/2019   CHOLHDL 3.9 07/21/2019   Lab Results  Component Value Date   TSH 1.810 07/21/2019   Lab Results  Component Value Date   HGBA1C 6.7 (H) 11/10/2019   Lab Results  Component Value Date   WBC 6.2 07/21/2019   HGB 14.0 07/21/2019   HCT 42.3 07/21/2019   MCV 91 07/21/2019   PLT 278 07/21/2019   Lab Results  Component Value Date   ALT 31 07/21/2019   AST 26 07/21/2019   ALKPHOS 65 07/21/2019   BILITOT 0.3 07/21/2019     Review of Systems  Constitutional: Positive for unexpected weight change. Negative for appetite change (not eating large meals - grazing), fatigue and fever.  HENT: Negative for tinnitus and trouble swallowing.   Eyes: Negative for visual disturbance.  Respiratory: Negative for cough, chest tightness and shortness of breath.   Cardiovascular: Negative for chest pain, palpitations and leg swelling.  Gastrointestinal: Negative for abdominal pain and vomiting.  Endocrine: Negative for polydipsia and polyuria.  Genitourinary: Negative for dysuria and hematuria.  Musculoskeletal: Positive for neck pain. Negative for arthralgias.  Neurological: Positive for headaches. Negative for tremors, weakness and numbness.  Psychiatric/Behavioral: Negative for dysphoric mood.    Patient Active Problem List   Diagnosis Date Noted  . Status post hysterectomy 03/23/2020  . Sleep disorder 11/10/2019  . Bilateral carpal tunnel syndrome 11/07/2016  . Herpes simplex infection 11/23/2015  . Fibromyalgia 03/11/2015  . Menopause syndrome 02/17/2015  . Esophageal reflux 02/17/2015  . Vitamin D deficiency  02/17/2015  . Hyperlipidemia associated with type 2 diabetes mellitus (HCC) 07/28/2014  . Essential hypertension 07/28/2014  . Type II diabetes mellitus with complication (HCC) 05/18/2014    Allergies  Allergen Reactions  .  Rabeprazole Sodium     C Diff Side Affects  . Augmentin [Amoxicillin-Pot Clavulanate] Itching  . Benazepril Hcl Cough  . Penicillins Other (See Comments)  . Jardiance [Empagliflozin] Rash    Recurrent yeast vaginitis    Past Surgical History:  Procedure Laterality Date  . ABDOMINAL HYSTERECTOMY    . CERVICAL DISC SURGERY    . COLONOSCOPY  07/22/2013  . DILATION AND CURETTAGE OF UTERUS  1996  . HERNIA REPAIR  2000   Umblical  . HIP ARTHROPLASTY Right   . TOTAL HIP ARTHROPLASTY Left 05/18/2014   Procedure: LEFT TOTAL HIP ARTHROPLASTY ANTERIOR APPROACH;  Surgeon: Marcene Corning, MD;  Location: [facility] OR;  Service: Orthopedics;  Laterality: Left;    Social History   Tobacco Use  . Smoking status: Former Smoker    Quit date: 06/14/1984    Years since quitting: 35.8  . Smokeless tobacco: Never Used  Vaping Use  . Vaping Use: Never used  Substance Use Topics  . Alcohol use: No    Alcohol/week: 0.0 standard drinks  . Drug use: No     Medication list has been reviewed and updated.  Current Meds  Medication Sig  . aspirin 81 MG tablet Take 81 mg by mouth daily.  Marland Kitchen atorvastatin (LIPITOR) 40 MG tablet TAKE 1 TABLET BY MOUTH EVERY DAY  . Cholecalciferol (VITAMIN D) 2000 units CAPS Take 1 capsule (2,000 Units total) by mouth daily.  . DULoxetine (CYMBALTA) 60 MG capsule TAKE 1 CAPSULE BY MOUTH EVERY DAY  . glucose blood test strip 1 each by Other route daily. Use as instructed  . lansoprazole (PREVACID) 15 MG capsule Take 15 mg by mouth daily at 12 noon.  Marland Kitchen losartan (COZAAR) 100 MG tablet TAKE 1 TABLET BY MOUTH EVERY DAY  . metFORMIN (GLUCOPHAGE-XR) 500 MG 24 hr tablet TAKE 1 TABLET BY MOUTH TWICE A DAY  . metoprolol succinate (TOPROL-XL) 100 MG 24 hr tablet TAKE 1/2 TABLET BY MOUTH TWICE A DAY WITH A MEAL  . Multiple Vitamins-Minerals (MULTIVITAMIN WITH MINERALS) tablet Take 1 tablet by mouth daily.  . TRULICITY 0.75 MG/0.5ML SOPN INJECT 0.75 MG INTO THE SKIN ONCE A WEEK.  .  valACYclovir (VALTREX) 1000 MG tablet TAKE 1 TABLET BY MOUTH TWICE A DAY    PHQ 2/9 Scores 03/30/2020 11/10/2019 07/21/2019 03/25/2019  PHQ - 2 Score 0 0 0 0  PHQ- 9 Score 4 4 0 0    GAD 7 : Generalized Anxiety Score 03/30/2020 11/10/2019 07/21/2019  Nervous, Anxious, on Edge 2 2 0  Control/stop worrying 2 3 0  Worry too much - different things 2 3 0  Trouble relaxing 2 2 0  Restless 3 2 0  Easily annoyed or irritable 3 2 0  Afraid - awful might happen 0 0 0  Total GAD 7 Score 14 14 0  Anxiety Difficulty Somewhat difficult Somewhat difficult Not difficult at all    BP Readings from Last 3 Encounters:  03/30/20 128/86  11/10/19 138/82  07/21/19 134/80    Physical Exam Vitals and nursing note reviewed.  Constitutional:      General: She is not in acute distress.    Appearance: Normal appearance. She is well-developed.  HENT:     Head: Normocephalic and atraumatic.  Neck:  Vascular: No carotid bruit.  Cardiovascular:     Rate and Rhythm: Normal rate and regular rhythm.     Pulses: Normal pulses.  Pulmonary:     Effort: Pulmonary effort is normal. No respiratory distress.  Musculoskeletal:     Cervical back: Spasms and tenderness present. Decreased range of motion.     Right lower leg: No edema.     Left lower leg: No edema.  Lymphadenopathy:     Cervical: No cervical adenopathy.  Skin:    General: Skin is warm and dry.     Findings: No rash.  Neurological:     General: No focal deficit present.     Mental Status: She is alert and oriented to person, place, and time.  Psychiatric:        Mood and Affect: Mood normal.        Behavior: Behavior normal.     Wt Readings from Last 3 Encounters:  03/30/20 114 lb (51.7 kg)  11/10/19 124 lb (56.2 kg)  07/21/19 125 lb (56.7 kg)    BP 128/86   Pulse 76   Ht 5' (1.524 m)   Wt 114 lb (51.7 kg)   LMP  (LMP Unknown)   SpO2 98%   BMI 22.26 kg/m   Assessment and Plan: 1. Type II diabetes mellitus with complication  (HCC) Clinically stable by exam and report without s/s of hypoglycemia. DM complicated by HTN. Tolerating medications - metformin and Trulicity  well without side effects or other concerns. If hypoglycemia occurs, will need to reduce medication dose - POCT glycosylated hemoglobin (Hb A1C) = 6.4  2. Essential hypertension Clinically stable exam with well controlled BP. Tolerating medications without side effects at this time. Pt to continue current regimen and low sodium diet; benefits of regular exercise as able discussed.  3. Muscle tension headache 2/2 shoulder muscle tension from full time baby sitting and the increased use of arms and shoulders Continue heat, topical rubs Add robaxin at HS for several weeks then use PRN - methocarbamol (ROBAXIN) 500 MG tablet; Take 1 tablet (500 mg total) by mouth in the morning and at bedtime.  Dispense: 60 tablet; Refill: 0   Partially dictated using Animal nutritionist. Any errors are unintentional.  Bari Edward, MD [facility]  03/30/2020

## 2020-04-06 ENCOUNTER — Ambulatory Visit
Admission: RE | Admit: 2020-04-06 | Discharge: 2020-04-06 | Disposition: A | Discharge status: Home / Self Care | Payer: Managed Care, Other (non HMO) | Source: Ambulatory Visit | Attending: Internal Medicine | Admitting: Internal Medicine

## 2020-04-06 ENCOUNTER — Other Ambulatory Visit: Payer: Self-pay

## 2020-04-06 DIAGNOSIS — N6489 Other specified disorders of breast: Secondary | ICD-10-CM | POA: Insufficient documentation

## 2020-04-06 DIAGNOSIS — R928 Other abnormal and inconclusive findings on diagnostic imaging of breast: Secondary | ICD-10-CM

## 2020-04-09 ENCOUNTER — Other Ambulatory Visit: Payer: Self-pay | Admitting: Internal Medicine

## 2020-04-09 DIAGNOSIS — G44209 Tension-type headache, unspecified, not intractable: Secondary | ICD-10-CM

## 2020-04-09 NOTE — Telephone Encounter (Signed)
Requested medication (s) are due for refill today: no  Requested medication (s) are on the active medication list: yes  Last refill:  03/30/20  Future visit scheduled: yes  Notes to clinic:  med not delegated to NT to RF   Requested Prescriptions  Pending Prescriptions Disp Refills   methocarbamol (ROBAXIN) 500 MG tablet [Pharmacy Med Name: METHOCARBAMOL 500 MG TABLET] 60 tablet 0    Sig: Take 1 tablet (500 mg total) by mouth in the morning and at bedtime.      Not Delegated - Analgesics:  Muscle Relaxants Failed - 04/09/2020  8:27 AM      Failed - This refill cannot be delegated      Passed - Valid encounter within last 6 months    Recent Outpatient Visits           1 week ago Type II diabetes mellitus with complication [facility])   [facility] Reubin Milan, MD   5 months ago Type II diabetes mellitus with complication [facility])   [facility] Reubin Milan, MD   8 months ago Annual physical exam   [facility] Reubin Milan, MD   1 year ago Type II diabetes mellitus with complication [facility])   [facility] Reubin Milan, MD   1 year ago Type II diabetes mellitus with complication [facility])   [facility] Reubin Milan, MD       Future Appointments             In 3 months Judithann Graves Nyoka Cowden, MD [facility], [facility]

## 2020-04-30 ENCOUNTER — Other Ambulatory Visit: Payer: Self-pay | Admitting: Internal Medicine

## 2020-04-30 DIAGNOSIS — G44209 Tension-type headache, unspecified, not intractable: Secondary | ICD-10-CM

## 2020-05-01 NOTE — Telephone Encounter (Signed)
Requested medication (s) are due for refill today: yes  Requested medication (s) are on the active medication list: yes  Last refill:  03/30/20  Future visit scheduled: yes  Notes to clinic:  medication not delegated to NT to RF   Requested Prescriptions  Pending Prescriptions Disp Refills   methocarbamol (ROBAXIN) 500 MG tablet [Pharmacy Med Name: METHOCARBAMOL 500 MG TABLET] 60 tablet 0    Sig: Take 1 tablet (500 mg total) by mouth in the morning and at bedtime.      Not Delegated - Analgesics:  Muscle Relaxants Failed - 04/30/2020  9:17 PM      Failed - This refill cannot be delegated      Passed - Valid encounter within last 6 months    Recent Outpatient Visits           1 month ago Type II diabetes mellitus with complication [facility])   [facility] Reubin Milan, MD   5 months ago Type II diabetes mellitus with complication [facility])   [facility] Reubin Milan, MD   9 months ago Annual physical exam   [facility] Reubin Milan, MD   1 year ago Type II diabetes mellitus with complication [facility])   [facility] Reubin Milan, MD   1 year ago Type II diabetes mellitus with complication [facility])   [facility] Reubin Milan, MD       Future Appointments             In 2 months Judithann Graves Nyoka Cowden, MD [facility], [facility]

## 2020-05-10 ENCOUNTER — Other Ambulatory Visit: Payer: Self-pay

## 2020-05-10 ENCOUNTER — Ambulatory Visit: Payer: Managed Care, Other (non HMO) | Admitting: Internal Medicine

## 2020-05-10 ENCOUNTER — Encounter: Payer: Self-pay | Admitting: Internal Medicine

## 2020-05-10 VITALS — BP 148/80 | HR 75 | Temp 98.3°F | Ht 60.0 in | Wt 114.0 lb

## 2020-05-10 DIAGNOSIS — J0100 Acute maxillary sinusitis, unspecified: Secondary | ICD-10-CM

## 2020-05-10 MED ORDER — AZITHROMYCIN 250 MG PO TABS: ORAL_TABLET | ORAL | 0 refills | Status: AC

## 2020-05-10 NOTE — Progress Notes (Signed)
Date:  05/10/2020   Name:  Tracy Bryan   DOB:  1961-02-12   MRN:  [id number]   Chief Complaint: Sinusitis (Having a lot of headaches since last visit 02/16. She did see the dentist and she has a dental infection and cavity on the left side of her jaw. But she is having pain on both sides of her face. Has not been on abx. Wants to try them to see if she has sinus infection to relieve some her symptoms.)  Sinusitis This is a new problem. The current episode started 1 to 4 weeks ago. The problem has been gradually worsening since onset. There has been no fever. The pain is mild. Associated symptoms include sinus pressure and a sore throat. Pertinent negatives include no chills, ear pain, headaches or shortness of breath. (Producing some thick yellow mucus) Past treatments include oral decongestants (claritin). The treatment provided no relief.    Lab Results  Component Value Date   CREATININE 0.82 07/21/2019   BUN 11 07/21/2019   NA 137 07/21/2019   K 4.7 07/21/2019   CL 101 07/21/2019   CO2 24 07/21/2019   Lab Results  Component Value Date   CHOL 197 07/21/2019   HDL 51 07/21/2019   LDLCALC 64 07/21/2019   TRIG 537 (H) 07/21/2019   CHOLHDL 3.9 07/21/2019   Lab Results  Component Value Date   TSH 1.810 07/21/2019   Lab Results  Component Value Date   HGBA1C 6.4 (A) 03/30/2020   Lab Results  Component Value Date   WBC 6.2 07/21/2019   HGB 14.0 07/21/2019   HCT 42.3 07/21/2019   MCV 91 07/21/2019   PLT 278 07/21/2019   Lab Results  Component Value Date   ALT 31 07/21/2019   AST 26 07/21/2019   ALKPHOS 65 07/21/2019   BILITOT 0.3 07/21/2019     Review of Systems  Constitutional: Negative for chills and fever.  HENT: Positive for dental problem, postnasal drip, sinus pressure and sore throat. Negative for ear pain.   Respiratory: Negative for chest tightness and shortness of breath.   Cardiovascular: Negative for chest pain.  Neurological: Negative for  dizziness, light-headedness and headaches.    Patient Active Problem List   Diagnosis Date Noted  . Muscle tension headache 03/30/2020  . Status post hysterectomy 03/23/2020  . Sleep disorder 11/10/2019  . Bilateral carpal tunnel syndrome 11/07/2016  . Herpes simplex infection 11/23/2015  . Fibromyalgia 03/11/2015  . Menopause syndrome 02/17/2015  . Esophageal reflux 02/17/2015  . Vitamin D deficiency 02/17/2015  . Hyperlipidemia associated with type 2 diabetes mellitus (HCC) 07/28/2014  . Essential hypertension 07/28/2014  . Type II diabetes mellitus with complication (HCC) 05/18/2014    Allergies  Allergen Reactions  . Rabeprazole Sodium     C Diff Side Affects  . Augmentin [Amoxicillin-Pot Clavulanate] Itching  . Benazepril Hcl Cough  . Penicillins Other (See Comments)  . Jardiance [Empagliflozin] Rash    Recurrent yeast vaginitis    Past Surgical History:  Procedure Laterality Date  . ABDOMINAL HYSTERECTOMY    . CERVICAL DISC SURGERY    . COLONOSCOPY  07/22/2013  . DILATION AND CURETTAGE OF UTERUS  1996  . HERNIA REPAIR  2000   Umblical  . HIP ARTHROPLASTY Right   . TOTAL HIP ARTHROPLASTY Left 05/18/2014   Procedure: LEFT TOTAL HIP ARTHROPLASTY ANTERIOR APPROACH;  Surgeon: Marcene Corning, MD;  Location: [facility] OR;  Service: Orthopedics;  Laterality: Left;    Social History  Tobacco Use  . Smoking status: Former Smoker    Quit date: 06/14/1984    Years since quitting: 35.9  . Smokeless tobacco: Never Used  Vaping Use  . Vaping Use: Never used  Substance Use Topics  . Alcohol use: No    Alcohol/week: 0.0 standard drinks  . Drug use: No     Medication list has been reviewed and updated.  Current Meds  Medication Sig  . aspirin 81 MG tablet Take 81 mg by mouth daily.  Marland Kitchen atorvastatin (LIPITOR) 40 MG tablet TAKE 1 TABLET BY MOUTH EVERY DAY  . Cholecalciferol (VITAMIN D) 2000 units CAPS Take 1 capsule (2,000 Units total) by mouth daily.  . DULoxetine (CYMBALTA)  60 MG capsule TAKE 1 CAPSULE BY MOUTH EVERY DAY  . glucose blood test strip 1 each by Other route daily. Use as instructed  . lansoprazole (PREVACID) 15 MG capsule Take 15 mg by mouth daily at 12 noon.  Marland Kitchen losartan (COZAAR) 100 MG tablet TAKE 1 TABLET BY MOUTH EVERY DAY  . metFORMIN (GLUCOPHAGE-XR) 500 MG 24 hr tablet TAKE 1 TABLET BY MOUTH TWICE A DAY  . methocarbamol (ROBAXIN) 500 MG tablet Take 1 tablet (500 mg total) by mouth every 12 (twelve) hours as needed for muscle spasms.  . metoprolol succinate (TOPROL-XL) 100 MG 24 hr tablet TAKE 1/2 TABLET BY MOUTH TWICE A DAY WITH A MEAL  . Multiple Vitamins-Minerals (MULTIVITAMIN WITH MINERALS) tablet Take 1 tablet by mouth daily.  . TRULICITY 0.75 MG/0.5ML SOPN INJECT 0.75 MG INTO THE SKIN ONCE A WEEK.  . valACYclovir (VALTREX) 1000 MG tablet TAKE 1 TABLET BY MOUTH TWICE A DAY    PHQ 2/9 Scores 05/10/2020 03/30/2020 11/10/2019 07/21/2019  PHQ - 2 Score 0 0 0 0  PHQ- 9 Score 0 4 4 0    GAD 7 : Generalized Anxiety Score 05/10/2020 03/30/2020 11/10/2019 07/21/2019  Nervous, Anxious, on Edge 0 2 2 0  Control/stop worrying 0 2 3 0  Worry too much - different things 0 2 3 0  Trouble relaxing 0 2 2 0  Restless 0 3 2 0  Easily annoyed or irritable 0 3 2 0  Afraid - awful might happen 0 0 0 0  Total GAD 7 Score 0 14 14 0  Anxiety Difficulty Not difficult at all Somewhat difficult Somewhat difficult Not difficult at all    BP Readings from Last 3 Encounters:  05/10/20 (!) 148/80  03/30/20 128/86  11/10/19 138/82    Physical Exam Constitutional:      Appearance: Normal appearance. She is well-developed.  HENT:     Head:     Jaw: Tenderness present.     Right Ear: Ear canal and external ear normal. Tympanic membrane is not erythematous or retracted.     Left Ear: Ear canal and external ear normal. Tympanic membrane is not erythematous or retracted.     Nose:     Right Sinus: Maxillary sinus tenderness present. No frontal sinus tenderness.      Left Sinus: Maxillary sinus tenderness present. No frontal sinus tenderness.     Mouth/Throat:     Mouth: No oral lesions.     Pharynx: Uvula midline.  Cardiovascular:     Rate and Rhythm: Normal rate and regular rhythm.     Heart sounds: Normal heart sounds.  Pulmonary:     Breath sounds: Normal breath sounds. No wheezing or rales.  Lymphadenopathy:     Cervical: No cervical adenopathy.  Neurological:  Mental Status: She is alert and oriented to person, place, and time.     Wt Readings from Last 3 Encounters:  05/10/20 114 lb (51.7 kg)  03/30/20 114 lb (51.7 kg)  11/10/19 124 lb (56.2 kg)    BP (!) 148/80   Pulse 75   Temp 98.3 F (36.8 C) (Oral)   Ht 5' (1.524 m)   Wt 114 lb (51.7 kg)   LMP  (LMP Unknown)   SpO2 97%   BMI 22.26 kg/m   Assessment and Plan: 1. Acute non-recurrent maxillary sinusitis - azithromycin (ZITHROMAX Z-PAK) 250 MG tablet; UAD  Dispense: 6 each; Refill: 0 Follow up with dental referral to rule out occult dental infection as well.  Partially dictated using Animal nutritionist. Any errors are unintentional.  Bari Edward, MD [facility]  05/10/2020

## 2020-06-02 ENCOUNTER — Other Ambulatory Visit: Payer: Self-pay

## 2020-06-02 ENCOUNTER — Ambulatory Visit
Admission: EM | Admit: 2020-06-02 | Discharge: 2020-06-02 | Disposition: A | Discharge status: Home / Self Care | Payer: Managed Care, Other (non HMO) | Attending: Internal Medicine | Admitting: Internal Medicine

## 2020-06-02 ENCOUNTER — Encounter: Payer: Self-pay | Admitting: Emergency Medicine

## 2020-06-02 DIAGNOSIS — J309 Allergic rhinitis, unspecified: Secondary | ICD-10-CM | POA: Diagnosis not present

## 2020-06-02 MED ORDER — FEXOFENADINE HCL 180 MG PO TABS: 180.0000 mg | ORAL_TABLET | Freq: Every day | ORAL | 0 refills | Status: DC

## 2020-06-02 MED ORDER — BENZONATATE 200 MG PO CAPS: 200.0000 mg | ORAL_CAPSULE | Freq: Two times a day (BID) | ORAL | 0 refills | Status: DC | PRN

## 2020-06-02 NOTE — ED Provider Notes (Signed)
[facility]    CSN: [id number] Arrival date & time: 06/02/20  1353      History   Chief Complaint Chief Complaint  Patient presents with  . Nasal Congestion  . watery eyes  . Sore Throat  . Cough    HPI Tracy Bryan is a 60 y.o. female who presents with onset of nose congestion, sneezing, cough, watery eeyes and cough x 1 days,. Had been outsidie.     Past Medical History:  Diagnosis Date  . Arthritis   . Diabetes mellitus without complication (HCC)    Type 2  . Disseminated intravascular coagulation (HCC) 1999   After child birth  . GERD (gastroesophageal reflux disease)   . Herpes simplex   . Hypercholesteremia   . Hypertension   . Migraines   . Pneumonia   . PONV (postoperative nausea and vomiting)     Patient Active Problem List   Diagnosis Date Noted  . Muscle tension headache 03/30/2020  . Status post hysterectomy 03/23/2020  . Sleep disorder 11/10/2019  . Bilateral carpal tunnel syndrome 11/07/2016  . Herpes simplex infection 11/23/2015  . Fibromyalgia 03/11/2015  . Menopause syndrome 02/17/2015  . Esophageal reflux 02/17/2015  . Vitamin D deficiency 02/17/2015  . Hyperlipidemia associated with type 2 diabetes mellitus (HCC) 07/28/2014  . Essential hypertension 07/28/2014  . Type II diabetes mellitus with complication (HCC) 05/18/2014    Past Surgical History:  Procedure Laterality Date  . ABDOMINAL HYSTERECTOMY    . CERVICAL DISC SURGERY    . COLONOSCOPY  07/22/2013  . DILATION AND CURETTAGE OF UTERUS  1996  . HERNIA REPAIR  2000   Umblical  . HIP ARTHROPLASTY Right   . TOTAL HIP ARTHROPLASTY Left 05/18/2014   Procedure: LEFT TOTAL HIP ARTHROPLASTY ANTERIOR APPROACH;  Surgeon: Marcene Corning, MD;  Location: [facility] OR;  Service: Orthopedics;  Laterality: Left;    OB History   No obstetric history on file.      Home Medications    Prior to Admission medications   Medication Sig Start Date End Date Taking? Authorizing  Provider  aspirin 81 MG tablet Take 81 mg by mouth daily.   Yes [provider]  atorvastatin (LIPITOR) 40 MG tablet TAKE 1 TABLET BY MOUTH EVERY DAY 08/08/19  Yes Reubin Milan, MD  Cholecalciferol (VITAMIN D) 2000 units CAPS Take 1 capsule (2,000 Units total) by mouth daily. 02/17/15  Yes Plonk, Chrissie Noa, MD  DULoxetine (CYMBALTA) 60 MG capsule TAKE 1 CAPSULE BY MOUTH EVERY DAY 03/16/20  Yes Reubin Milan, MD  glucose blood test strip 1 each by Other route daily. Use as instructed   Yes [provider]  lansoprazole (PREVACID) 15 MG capsule Take 15 mg by mouth daily at 12 noon.   Yes [provider]  losartan (COZAAR) 100 MG tablet TAKE 1 TABLET BY MOUTH EVERY DAY 01/31/20  Yes Reubin Milan, MD  metFORMIN (GLUCOPHAGE-XR) 500 MG 24 hr tablet TAKE 1 TABLET BY MOUTH TWICE A DAY 02/07/20  Yes Reubin Milan, MD  metoprolol succinate (TOPROL-XL) 100 MG 24 hr tablet TAKE 1/2 TABLET BY MOUTH TWICE A DAY WITH A MEAL 03/16/20  Yes Reubin Milan, MD  Multiple Vitamins-Minerals (MULTIVITAMIN WITH MINERALS) tablet Take 1 tablet by mouth daily.   Yes [provider]  TRULICITY 0.75 MG/0.5ML SOPN INJECT 0.75 MG INTO THE SKIN ONCE A WEEK. 03/24/20  Yes Reubin Milan, MD  valACYclovir (VALTREX) 1000 MG tablet TAKE 1 TABLET BY  MOUTH TWICE A DAY 03/24/20  Yes Reubin Milan, MD  methocarbamol (ROBAXIN) 500 MG tablet Take 1 tablet (500 mg total) by mouth every 12 (twelve) hours as needed for muscle spasms. 05/02/20   Reubin Milan, MD    Family History Family History  Problem Relation Age of Onset  . Hypertension Mother   . Stroke Mother   . Hyperlipidemia Mother   . Heart disease Mother        heart attack  . Diabetes Father   . Cancer Father        lung  . Diabetes Sister   . Hyperlipidemia Sister   . Stroke Paternal Grandfather   . Hypertension Paternal Grandfather   . Stroke Maternal Grandfather   . Alzheimer's disease Paternal Grandmother    . Diabetes Sister   . Cancer Sister        breast  . Breast cancer Sister 4  . Breast cancer Paternal Aunt     Social History Social History   Tobacco Use  . Smoking status: Former Smoker    Quit date: 06/14/1984    Years since quitting: 35.9  . Smokeless tobacco: Never Used  Vaping Use  . Vaping Use: Never used  Substance Use Topics  . Alcohol use: No    Alcohol/week: 0.0 standard drinks  . Drug use: No     Allergies   Rabeprazole sodium, Augmentin [amoxicillin-pot clavulanate], Benazepril hcl, Penicillins, and Jardiance [empagliflozin]   Review of Systems Review of Systems  Constitutional: Positive for appetite change and fatigue. Negative for activity change, chills, diaphoresis and fever.  HENT: Positive for congestion, postnasal drip, rhinorrhea and sneezing. Negative for sore throat.   Eyes: Positive for itching. Negative for discharge.  Respiratory: Positive for cough. Negative for chest tightness, shortness of breath and wheezing.   Cardiovascular: Negative for chest pain.  Gastrointestinal: Negative for diarrhea, nausea and vomiting.  Musculoskeletal: Negative for gait problem and myalgias.  Allergic/Immunologic: Positive for environmental allergies.  Neurological: Positive for headaches.     Physical Exam Triage Vital Signs ED Triage Vitals  Enc Vitals Group     BP 06/02/20 1438 (!) 149/85     Pulse Rate 06/02/20 1438 92     Resp 06/02/20 1438 18     Temp 06/02/20 1438 98.4 F (36.9 C)     Temp Source 06/02/20 1438 Oral     SpO2 06/02/20 1438 99 %     Weight 06/02/20 1439 118 lb (53.5 kg)     Height 06/02/20 1439 5' (1.524 m)     Head Circumference --      Peak Flow --      Pain Score 06/02/20 1438 7     Pain Loc --      Pain Edu? --      Excl. in GC? --    No data found.  Updated Vital Signs BP (!) 149/85 (BP Location: Left Arm)   Pulse 92   Temp 98.4 F (36.9 C) (Oral)   Resp 18   Ht 5' (1.524 m)   Wt 118 lb (53.5 kg)   LMP  (LMP  Unknown)   SpO2 99%   BMI 23.05 kg/m   Visual Acuity Right Eye Distance:   Left Eye Distance:   Bilateral Distance:    Right Eye Near:   Left Eye Near:    Bilateral Near:     Physical Exam Alert pt NAD who seems nasally congested EYES- non icterus, mild watering, no purulent  drainage NOSE- moderate mucosa congestion which is pale pink with clear mucous. No sinus tenderness TM- both gray and little dull, canals are normal PHARYNX- clear, clear drainage noted.  NECK- supple with no nodes LUNGS- clear HEART - RRR with no murmurs SKIN- non jaundiced, no rashes.    UC Treatments / Results  Labs (all labs ordered are listed, but only abnormal results are displayed) Labs Reviewed - No data to display  EKG   Radiology No results found.  Procedures Procedures (including critical care time)  Medications Ordered in UC Medications - No data to display  Initial Impression / Assessment and Plan / UC Course  I have reviewed the triage vital signs and the nursing notes. Has allergic rhinitis. She was placed on Allegra and Tessalon perless as noted.   Final Clinical Impressions(s) / UC Diagnoses   Final diagnoses:  None   Discharge Instructions   None    ED Prescriptions    None     PDMP not reviewed this encounter.   Garey Ham, Cordelia Poche 06/02/20 1537

## 2020-06-02 NOTE — ED Triage Notes (Signed)
Patient in today c/o nasal congestion, cough, sore throat and watery eyes x 1 day. Patient has taken OTC Benadryl. Patient denies fever. Patient has had the covid vaccines, no booster. Patient declines covid testing at this time.

## 2020-06-25 ENCOUNTER — Other Ambulatory Visit: Payer: Self-pay | Admitting: Internal Medicine

## 2020-06-25 DIAGNOSIS — E118 Type 2 diabetes mellitus with unspecified complications: Secondary | ICD-10-CM

## 2020-07-04 ENCOUNTER — Other Ambulatory Visit: Payer: Self-pay | Admitting: Internal Medicine

## 2020-07-22 ENCOUNTER — Other Ambulatory Visit: Payer: Self-pay

## 2020-07-22 ENCOUNTER — Ambulatory Visit (INDEPENDENT_AMBULATORY_CARE_PROVIDER_SITE_OTHER): Payer: Managed Care, Other (non HMO) | Admitting: Internal Medicine

## 2020-07-22 ENCOUNTER — Encounter: Payer: Self-pay | Admitting: Internal Medicine

## 2020-07-22 VITALS — BP 146/100 | HR 67 | Temp 98.3°F | Ht 60.0 in | Wt 115.0 lb

## 2020-07-22 DIAGNOSIS — E1169 Type 2 diabetes mellitus with other specified complication: Secondary | ICD-10-CM

## 2020-07-22 DIAGNOSIS — E118 Type 2 diabetes mellitus with unspecified complications: Secondary | ICD-10-CM

## 2020-07-22 DIAGNOSIS — E785 Hyperlipidemia, unspecified: Secondary | ICD-10-CM

## 2020-07-22 DIAGNOSIS — Z0000 Encounter for general adult medical examination without abnormal findings: Secondary | ICD-10-CM | POA: Diagnosis not present

## 2020-07-22 DIAGNOSIS — I10 Essential (primary) hypertension: Secondary | ICD-10-CM

## 2020-07-22 LAB — POCT URINALYSIS DIPSTICK
Bilirubin, UA: NEGATIVE
Blood, UA: NEGATIVE
Glucose, UA: NEGATIVE
Ketones, UA: NEGATIVE
Leukocytes, UA: NEGATIVE
Nitrite, UA: NEGATIVE
Protein, UA: POSITIVE — AB
Spec Grav, UA: 1.015 (ref 1.010–1.025)
Urobilinogen, UA: 0.2 E.U./dL
pH, UA: 7.5 (ref 5.0–8.0)

## 2020-07-22 MED ORDER — AMLODIPINE BESYLATE 5 MG PO TABS: 5.0000 mg | ORAL_TABLET | Freq: Every day | ORAL | 1 refills | Status: DC

## 2020-07-22 NOTE — Patient Instructions (Addendum)
Dermatology evaluation  Rober Minion   [phone]  Eye Exam

## 2020-07-22 NOTE — Progress Notes (Signed)
Date:  07/22/2020   Name:  Tracy Bryan   DOB:  1960-08-11   MRN:  [id number]   Chief Complaint: Annual Exam (Breast exam no pap) Tracy CoinSharon S Bryan is a 60 y.o. female who presents today for her Complete Annual Exam. She feels well. She reports exercising walking 3 days a week. She reports she is sleeping fairly well. Breast complaints none.  Mammogram: 03/2020 left asymmetry - repeat and US benign DEXA: none Pap smear: discontinued Colonoscopy: 07/2013  Immunization History  Administered Date(s) Administered   Influenza Split 11/07/2016   Influenza,inj,Quad PF,6+ Mos 11/28/2017, 11/17/2018, 11/10/2019   Influenza-Unspecified 11/20/2013, 11/13/2014   PFIZER(Purple Top)SARS-COV-2 Vaccination 06/04/2019, 06/30/2019   Pneumococcal Polysaccharide-23 12/20/2010   Tdap 11/20/2013, 07/30/2016    Hypertension This is a chronic problem. The problem is controlled. Pertinent negatives include no chest pain, headaches, palpitations or shortness of breath. Past treatments include angiotensin blockers and beta blockers. The current treatment provides significant improvement. There is no history of kidney disease, CAD/MI or CVA.  Diabetes She presents for her follow-up diabetic visit. She has type 2 diabetes mellitus. Her disease course has been stable. Pertinent negatives for hypoglycemia include no dizziness, headaches, nervousness/anxiousness or tremors. Pertinent negatives for diabetes include no chest pain, no fatigue, no polydipsia and no polyuria. Pertinent negatives for diabetic complications include no CVA. Current diabetic treatments: trulicity and metformin. She is compliant with treatment all of the time. Her weight is stable. She monitors blood glucose at home 1-2 x per day. There is no change in her home blood glucose trend. Her breakfast blood glucose is taken between 8-9 am. Her breakfast blood glucose range is generally 110-130 mg/dl. An ACE inhibitor/angiotensin II receptor  blocker is being taken. Eye exam is not current.  Hyperlipidemia This is a chronic problem. The problem is controlled. Pertinent negatives include no chest pain or shortness of breath. Current antihyperlipidemic treatment includes statins. The current treatment provides significant improvement of lipids.  Gastroesophageal Reflux She complains of heartburn. She reports no abdominal pain, no chest pain, no coughing or no wheezing. This is a recurrent problem. The problem occurs occasionally. Pertinent negatives include no fatigue. She has tried a PPI for the symptoms.   Lab Results  Component Value Date   CREATININE 0.82 07/21/2019   BUN 11 07/21/2019   NA 137 07/21/2019   K 4.7 07/21/2019   CL 101 07/21/2019   CO2 24 07/21/2019   Lab Results  Component Value Date   CHOL 197 07/21/2019   HDL 51 07/21/2019   LDLCALC 64 07/21/2019   TRIG 537 (H) 07/21/2019   CHOLHDL 3.9 07/21/2019   Lab Results  Component Value Date   TSH 1.810 07/21/2019   Lab Results  Component Value Date   HGBA1C 6.4 (A) 03/30/2020   Lab Results  Component Value Date   WBC 6.2 07/21/2019   HGB 14.0 07/21/2019   HCT 42.3 07/21/2019   MCV 91 07/21/2019   PLT 278 07/21/2019   Lab Results  Component Value Date   ALT 31 07/21/2019   AST 26 07/21/2019   ALKPHOS 65 07/21/2019   BILITOT 0.3 07/21/2019     Review of Systems  Constitutional:  Negative for chills, fatigue and fever.  HENT:  Negative for congestion, hearing loss, tinnitus, trouble swallowing and voice change.   Eyes:  Negative for visual disturbance.  Respiratory:  Negative for cough, chest tightness, shortness of breath and wheezing.   Cardiovascular:  Negative for chest pain, palpitations  and leg swelling.  Gastrointestinal:  Positive for heartburn. Negative for abdominal pain, constipation, diarrhea and vomiting.  Endocrine: Negative for polydipsia and polyuria.  Genitourinary:  Negative for dysuria, frequency, genital sores, vaginal  bleeding and vaginal discharge.  Musculoskeletal:  Negative for arthralgias, gait problem and joint swelling.  Skin:  Negative for color change and rash.  Neurological:  Negative for dizziness, tremors, light-headedness and headaches.  Hematological:  Negative for adenopathy. Does not bruise/bleed easily.  Psychiatric/Behavioral:  Negative for dysphoric mood and sleep disturbance. The patient is not nervous/anxious.    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   Sleep disorder 11/10/2019   Bilateral carpal tunnel syndrome 11/07/2016   Herpes simplex infection 11/23/2015   Fibromyalgia 03/11/2015   Menopause syndrome 02/17/2015   Esophageal reflux 02/17/2015   Vitamin D deficiency 02/17/2015   Hyperlipidemia associated with type 2 diabetes mellitus (HCC) 07/28/2014   Essential hypertension 07/28/2014   Type II diabetes mellitus with complication (HCC) 05/18/2014    Allergies  Allergen Reactions   Rabeprazole Sodium     C Diff Side Affects   Augmentin [Amoxicillin-Pot Clavulanate] Itching   Benazepril Hcl Cough   Penicillins Other (See Comments)   Jardiance [Empagliflozin] Rash    Recurrent yeast vaginitis    Past Surgical History:  Procedure Laterality Date   ABDOMINAL HYSTERECTOMY     CERVICAL DISC SURGERY     COLONOSCOPY  07/22/2013   DILATION AND CURETTAGE OF UTERUS  1996   HERNIA REPAIR  2000   Umblical   HIP ARTHROPLASTY Right    TOTAL HIP ARTHROPLASTY Left 05/18/2014   Procedure: LEFT TOTAL HIP ARTHROPLASTY ANTERIOR APPROACH;  Surgeon: Marcene Corning, MD;  Location: [facility] OR;  Service: Orthopedics;  Laterality: Left;    Social History   Tobacco Use   Smoking status: Former    Pack years: 0.00    Types: Cigarettes    Quit date: 06/14/1984    Years since quitting: 36.1   Smokeless tobacco: Never  Vaping Use   Vaping Use: Never used  Substance Use Topics   Alcohol use: No     Alcohol/week: 0.0 standard drinks   Drug use: No     Medication list has been reviewed and updated.  Current Meds  Medication Sig   aspirin 81 MG tablet Take 81 mg by mouth daily.   atorvastatin (LIPITOR) 40 MG tablet TAKE 1 TABLET BY MOUTH EVERY DAY   Cholecalciferol (VITAMIN D) 2000 units CAPS Take 1 capsule (2,000 Units total) by mouth daily.   DULoxetine (CYMBALTA) 60 MG capsule TAKE 1 CAPSULE BY MOUTH EVERY DAY   glucose blood test strip 1 each by Other route daily. Use as instructed   lansoprazole (PREVACID) 15 MG capsule Take 15 mg by mouth daily at 12 noon.   losartan (COZAAR) 100 MG tablet TAKE 1 TABLET BY MOUTH EVERY DAY   metFORMIN (GLUCOPHAGE-XR) 500 MG 24 hr tablet TAKE 1 TABLET BY MOUTH TWICE A DAY   metoprolol succinate (TOPROL-XL) 100 MG 24 hr tablet TAKE 1/2 TABLET BY MOUTH TWICE A DAY WITH A MEAL   Multiple Vitamins-Minerals (MULTIVITAMIN WITH MINERALS) tablet Take 1 tablet by mouth daily.   TRULICITY 0.75 MG/0.5ML SOPN INJECT 0.75 MG INTO THE SKIN ONCE A WEEK.   valACYclovir (VALTREX) 1000 MG tablet TAKE 1 TABLET BY MOUTH TWICE A DAY   [DISCONTINUED] fexofenadine (ALLEGRA ALLERGY) 180 MG tablet Take 1 tablet (180  mg total) by mouth daily.    PHQ 2/9 Scores 07/22/2020 05/10/2020 03/30/2020 11/10/2019  PHQ - 2 Score 0 0 0 0  PHQ- 9 Score 2 0 4 4    GAD 7 : Generalized Anxiety Score 07/22/2020 05/10/2020 03/30/2020 11/10/2019  Nervous, Anxious, on Edge 2 0 2 2  Control/stop worrying 2 0 2 3  Worry too much - different things 2 0 2 3  Trouble relaxing 1 0 2 2  Restless 2 0 3 2  Easily annoyed or irritable 2 0 3 2  Afraid - awful might happen 1 0 0 0  Total GAD 7 Score 12 0 14 14  Anxiety Difficulty - Not difficult at all Somewhat difficult Somewhat difficult    BP Readings from Last 3 Encounters:  07/22/20 (!) 146/100  06/02/20 (!) 149/85  05/10/20 (!) 148/80    Physical Exam Vitals and nursing note reviewed.  Constitutional:      General: She is not in acute  distress.    Appearance: She is well-developed.  HENT:     Head: Normocephalic and atraumatic.     Right Ear: Tympanic membrane and ear canal normal.     Left Ear: Tympanic membrane and ear canal normal.     Nose:     Right Sinus: No maxillary sinus tenderness.     Left Sinus: No maxillary sinus tenderness.  Eyes:     General: No scleral icterus.       Right eye: No discharge.        Left eye: No discharge.     Conjunctiva/sclera: Conjunctivae normal.  Neck:     Thyroid: No thyromegaly.     Vascular: No carotid bruit.  Cardiovascular:     Rate and Rhythm: Normal rate and regular rhythm.     Pulses: Normal pulses.     Heart sounds: Normal heart sounds.  Pulmonary:     Effort: Pulmonary effort is normal. No respiratory distress.     Breath sounds: No wheezing.  Chest:  Breasts:    Right: No mass, nipple discharge, skin change or tenderness.     Left: No mass, nipple discharge, skin change or tenderness.  Abdominal:     General: Bowel sounds are normal.     Palpations: Abdomen is soft.     Tenderness: There is no abdominal tenderness.  Musculoskeletal:     Cervical back: Normal range of motion. No erythema.     Right lower leg: No edema.     Left lower leg: No edema.  Lymphadenopathy:     Cervical: No cervical adenopathy.  Skin:    General: Skin is warm and dry.     Findings: No rash.  Neurological:     Mental Status: She is alert and oriented to person, place, and time.     Cranial Nerves: No cranial nerve deficit.     Sensory: No sensory deficit.     Deep Tendon Reflexes: Reflexes are normal and symmetric.  Psychiatric:        Attention and Perception: Attention normal.        Mood and Affect: Mood normal.    Wt Readings from Last 3 Encounters:  07/22/20 115 lb (52.2 kg)  06/02/20 118 lb (53.5 kg)  05/10/20 114 lb (51.7 kg)    BP (!) 146/100   Pulse 67   Temp 98.3 F (36.8 C) (Oral)   Ht 5' (1.524 m)   Wt 115 lb (52.2 kg)   LMP  (LMP Unknown)  SpO2 100%    BMI 22.46 kg/m   Assessment and Plan: 1. Annual physical exam Normal exam. Screenings are up to date Recommend Dermatology skin survey Discussed Shingrix vaccines Unable to obtain previous Colonoscopy report.  2. Essential hypertension BP is not controlled - will add amlodipine. Pt will monitor at home - follow up if not improved or medication issues - CBC with Differential/Platelet - Comprehensive metabolic panel - POCT urinalysis dipstick - amLODipine (NORVASC) 5 MG tablet; Take 1 tablet (5 mg total) by mouth daily.  Dispense: 90 tablet; Refill: 1  3. Type II diabetes mellitus with complication (HCC) Clinically stable by exam and report without s/s of hypoglycemia. DM complicated by HTN. Tolerating medications well without side effects or other concerns. - Comprehensive metabolic panel - Hemoglobin A1c - TSH  4. Hyperlipidemia associated with type 2 diabetes mellitus (HCC) Doing well on statin therapy - Lipid panel   Partially dictated using Animal nutritionist. Any errors are unintentional.  Bari Edward, MD [facility]  07/22/2020

## 2020-07-23 LAB — LIPID PANEL
Chol/HDL Ratio: 3.5 ratio (ref 0.0–4.4)
Cholesterol, Total: 160 mg/dL (ref 100–199)
HDL: 46 mg/dL (ref >39)
LDL Chol Calc (NIH): 62 mg/dL (ref 0–99)
Triglycerides: 338 mg/dL — ABNORMAL HIGH (ref 0–149)
VLDL Cholesterol Cal: 52 mg/dL — ABNORMAL HIGH (ref 5–40)

## 2020-07-23 LAB — COMPREHENSIVE METABOLIC PANEL
ALT: 17 IU/L (ref 0–32)
AST: 16 IU/L (ref 0–40)
Albumin/Globulin Ratio: 2.3 — ABNORMAL HIGH (ref 1.2–2.2)
Albumin: 4.8 g/dL (ref 3.8–4.8)
Alkaline Phosphatase: 54 IU/L (ref 44–121)
BUN/Creatinine Ratio: 21 (ref 12–28)
BUN: 17 mg/dL (ref 8–27)
Bilirubin Total: 0.3 mg/dL (ref 0.0–1.2)
CO2: 24 mmol/L (ref 20–29)
Calcium: 9.6 mg/dL (ref 8.7–10.3)
Chloride: 101 mmol/L (ref 96–106)
Creatinine, Ser: 0.82 mg/dL (ref 0.57–1.00)
Globulin, Total: 2.1 g/dL (ref 1.5–4.5)
Glucose: 107 mg/dL — ABNORMAL HIGH (ref 65–99)
Potassium: 4.8 mmol/L (ref 3.5–5.2)
Sodium: 139 mmol/L (ref 134–144)
Total Protein: 6.9 g/dL (ref 6.0–8.5)
eGFR: 81 mL/min/{1.73_m2} (ref >59)

## 2020-07-23 LAB — CBC WITH DIFFERENTIAL/PLATELET
Basophils Absolute: 0.1 10*3/uL (ref 0.0–0.2)
Basos: 2 %
EOS (ABSOLUTE): 0.3 10*3/uL (ref 0.0–0.4)
Eos: 4 %
Hematocrit: 41.1 % (ref 34.0–46.6)
Hemoglobin: 13.8 g/dL (ref 11.1–15.9)
Immature Grans (Abs): 0 10*3/uL (ref 0.0–0.1)
Immature Granulocytes: 0 %
Lymphocytes Absolute: 2.2 10*3/uL (ref 0.7–3.1)
Lymphs: 31 %
MCH: 29.6 pg (ref 26.6–33.0)
MCHC: 33.6 g/dL (ref 31.5–35.7)
MCV: 88 fL (ref 79–97)
Monocytes Absolute: 0.6 10*3/uL (ref 0.1–0.9)
Monocytes: 8 %
Neutrophils Absolute: 4.1 10*3/uL (ref 1.4–7.0)
Neutrophils: 55 %
Platelets: 302 10*3/uL (ref 150–450)
RBC: 4.67 x10E6/uL (ref 3.77–5.28)
RDW: 12.8 % (ref 11.7–15.4)
WBC: 7.4 10*3/uL (ref 3.4–10.8)

## 2020-07-23 LAB — HEMOGLOBIN A1C
Est. average glucose Bld gHb Est-mCnc: 128 mg/dL
Hgb A1c MFr Bld: 6.1 % — ABNORMAL HIGH (ref 4.8–5.6)

## 2020-07-23 LAB — TSH: TSH: 1.43 u[IU]/mL (ref 0.450–4.500)

## 2020-08-04 ENCOUNTER — Other Ambulatory Visit: Payer: Self-pay | Admitting: Internal Medicine

## 2020-08-04 DIAGNOSIS — E118 Type 2 diabetes mellitus with unspecified complications: Secondary | ICD-10-CM

## 2020-09-10 ENCOUNTER — Other Ambulatory Visit: Payer: Self-pay

## 2020-09-10 ENCOUNTER — Ambulatory Visit
Admission: EM | Admit: 2020-09-10 | Discharge: 2020-09-10 | Disposition: A | Discharge status: Home / Self Care | Payer: Managed Care, Other (non HMO)

## 2020-09-10 DIAGNOSIS — T148XXA Other injury of unspecified body region, initial encounter: Secondary | ICD-10-CM

## 2020-09-10 DIAGNOSIS — W540XXA Bitten by dog, initial encounter: Secondary | ICD-10-CM

## 2020-09-10 NOTE — ED Provider Notes (Signed)
[facility]    CSN: [id number] Arrival date & time: 09/10/20  6962      History   Chief Complaint Chief Complaint  Patient presents with   Animal Bite   Mass     HPI Tracy Bryan is a 60 y.o. female.  Patient presents with a dog bite on her right forearm that occurred 9 days ago.  She states she had a "lump" formed under the single puncture wound on her forearm.  The dog that bit her is hers and is up-to-date on all vaccinations.  The "lump" on her arm was improving but she accidentally hit her arm yesterday and it got larger again.  Patient reports she is up-to-date on her tetanus.  Patient denies fever, chills, drainage from the wound, erythema, numbness, weakness, paresthesias, or other symptoms.  No treatments attempted at home.  Her medical history includes diabetes and hypertension.  The history is provided by the patient and medical records.   Past Medical History:  Diagnosis Date   Arthritis    Diabetes mellitus without complication (HCC)    Type 2   Disseminated intravascular coagulation (HCC) 1999   After child birth   GERD (gastroesophageal reflux disease)    Herpes simplex    Hypercholesteremia    Hypertension    Migraines    Pneumonia    PONV (postoperative nausea and vomiting)     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   Sleep disorder 11/10/2019   Bilateral carpal tunnel syndrome 11/07/2016   Herpes simplex infection 11/23/2015   Fibromyalgia 03/11/2015   Menopause syndrome 02/17/2015   Esophageal reflux 02/17/2015   Vitamin D deficiency 02/17/2015   Hyperlipidemia associated with type 2 diabetes mellitus (HCC) 07/28/2014   Essential hypertension 07/28/2014   Type II diabetes mellitus with complication (HCC) 05/18/2014    Past Surgical History:  Procedure Laterality Date   ABDOMINAL HYSTERECTOMY     CERVICAL DISC SURGERY     COLONOSCOPY   07/22/2013   DILATION AND CURETTAGE OF UTERUS  1996   HERNIA REPAIR  2000   Umblical   HIP ARTHROPLASTY Right    TOTAL HIP ARTHROPLASTY Left 05/18/2014   Procedure: LEFT TOTAL HIP ARTHROPLASTY ANTERIOR APPROACH;  Surgeon: Marcene Corning, MD;  Location: [facility] OR;  Service: Orthopedics;  Laterality: Left;    OB History   No obstetric history on file.      Home Medications    Prior to Admission medications   Medication Sig Start Date End Date Taking? Authorizing Provider  amLODipine (NORVASC) 5 MG tablet Take 1 tablet (5 mg total) by mouth daily. 07/22/20  Yes Reubin Milan, MD  aspirin 81 MG tablet Take 81 mg by mouth daily.   Yes [provider]  atorvastatin (LIPITOR) 40 MG tablet TAKE 1 TABLET BY MOUTH EVERY DAY 08/08/19  Yes Reubin Milan, MD  Cholecalciferol (VITAMIN D) 2000 units CAPS Take 1 capsule (2,000 Units total) by mouth daily. 02/17/15  Yes Plonk, Chrissie Noa, MD  DULoxetine (CYMBALTA) 60 MG capsule TAKE 1 CAPSULE BY MOUTH EVERY DAY 07/04/20  Yes Reubin Milan, MD  glucose blood test strip 1 each by Other route daily. Use as instructed   Yes [provider]  lansoprazole (PREVACID) 15 MG capsule Take 15 mg by mouth daily at 12 noon.   Yes [provider]  losartan (COZAAR) 100 MG tablet TAKE 1 TABLET BY  MOUTH EVERY DAY 01/31/20  Yes Reubin Milan, MD  metFORMIN (GLUCOPHAGE-XR) 500 MG 24 hr tablet TAKE 1 TABLET BY MOUTH TWICE A DAY 08/04/20  Yes Reubin Milan, MD  metoprolol succinate (TOPROL-XL) 100 MG 24 hr tablet TAKE 1/2 TABLET BY MOUTH TWICE A DAY WITH A MEAL 03/16/20  Yes Reubin Milan, MD  Multiple Vitamins-Minerals (MULTIVITAMIN WITH MINERALS) tablet Take 1 tablet by mouth daily.   Yes [provider]  TRULICITY 0.75 MG/0.5ML SOPN INJECT 0.75 MG INTO THE SKIN ONCE A WEEK. 06/25/20  Yes Reubin Milan, MD  valACYclovir (VALTREX) 1000 MG tablet TAKE 1 TABLET BY MOUTH TWICE A DAY 03/24/20  Yes Reubin Milan, MD     Family History Family History  Problem Relation Age of Onset   Hypertension Mother    Stroke Mother    Hyperlipidemia Mother    Heart disease Mother        heart attack   Diabetes Father    Cancer Father        lung   Diabetes Sister    Hyperlipidemia Sister    Stroke Paternal Grandfather    Hypertension Paternal Grandfather    Stroke Maternal Grandfather    Alzheimer's disease Paternal Grandmother    Diabetes Sister    Cancer Sister        breast   Breast cancer Sister 12   Breast cancer Paternal Aunt     Social History Social History   Tobacco Use   Smoking status: Former    Types: Cigarettes    Quit date: 06/14/1984    Years since quitting: 36.2   Smokeless tobacco: Never  Vaping Use   Vaping Use: Never used  Substance Use Topics   Alcohol use: No    Alcohol/week: 0.0 standard drinks   Drug use: No     Allergies   Rabeprazole sodium, Augmentin [amoxicillin-pot clavulanate], Benazepril hcl, Penicillins, and Jardiance [empagliflozin]   Review of Systems Review of Systems  Constitutional:  Negative for chills and fever.  Respiratory:  Negative for cough and shortness of breath.   Cardiovascular:  Negative for chest pain and palpitations.  Musculoskeletal:  Negative for arthralgias and joint swelling.  Skin:  Positive for wound. Negative for color change.  Neurological:  Negative for weakness and numbness.  All other systems reviewed and are negative.   Physical Exam Triage Vital Signs ED Triage Vitals  Enc Vitals Group     BP      Pulse      Resp      Temp      Temp src      SpO2      Weight      Height      Head Circumference      Peak Flow      Pain Score      Pain Loc      Pain Edu?      Excl. in GC?    No data found.  Updated Vital Signs BP 131/83 (BP Location: Right Arm)   Pulse 64   Temp 98.7 F (37.1 C) (Oral)   Resp 16   Ht 5' (1.524 m)   Wt 114 lb (51.7 kg)   LMP  (LMP Unknown)   SpO2 98%   BMI 22.26 kg/m   Visual  Acuity Right Eye Distance:   Left Eye Distance:   Bilateral Distance:    Right Eye Near:   Left Eye Near:  Bilateral Near:     Physical Exam Vitals and nursing note reviewed.  Constitutional:      General: She is not in acute distress.    Appearance: She is well-developed. She is not ill-appearing.  HENT:     Head: Normocephalic and atraumatic.     Mouth/Throat:     Mouth: Mucous membranes are moist.  Eyes:     Conjunctiva/sclera: Conjunctivae normal.  Cardiovascular:     Rate and Rhythm: Normal rate and regular rhythm.     Heart sounds: Normal heart sounds.  Pulmonary:     Effort: Pulmonary effort is normal. No respiratory distress.     Breath sounds: Normal breath sounds.  Abdominal:     Palpations: Abdomen is soft.     Tenderness: There is no abdominal tenderness.  Musculoskeletal:        General: Normal range of motion.     Cervical back: Neck supple.  Skin:    General: Skin is warm and dry.     Capillary Refill: Capillary refill takes less than 2 seconds.     Findings: Lesion present.     Comments: Right forearm: 4 mm healing puncture wound without drainage.  Hematoma lateral to puncture wound.  Needle aspiration with blood return only; no purulent return.  Neurological:     General: No focal deficit present.     Mental Status: She is alert and oriented to person, place, and time.     Gait: Gait normal.  Psychiatric:        Mood and Affect: Mood normal.        Behavior: Behavior normal.     UC Treatments / Results  Labs (all labs ordered are listed, but only abnormal results are displayed) Labs Reviewed - No data to display  EKG   Radiology No results found.  Procedures Procedures (including critical care time)  Medications Ordered in UC Medications - No data to display  Initial Impression / Assessment and Plan / UC Course  I have reviewed the triage vital signs and the nursing notes.  Pertinent labs & imaging results that were available  during my care of the patient were reviewed by me and considered in my medical decision making (see chart for details).  Hematoma, dog bite on right forearm.  Education provided on treatment of hematomas.  Dog belongs to the patient and is up-to-date on vaccinations.  Patient is up-to-date on her tetanus shot.  No indication of infection to the dog bite.  Instructed patient to follow-up with her PCP as needed.  She agrees to plan of care.   Final Clinical Impressions(s) / UC Diagnoses   Final diagnoses:  Hematoma  Dog bite, initial encounter     Discharge Instructions      Follow up with your primary care provider if your symptoms are not improving.         ED Prescriptions   None    PDMP not reviewed this encounter.   Mickie Bail, NP 09/10/20 [phone]

## 2020-09-10 NOTE — ED Triage Notes (Signed)
Patient states that her dog bit her last Thursday and a lump formed under the skin at the area. Located on right forearm. Patient states that lump/swollen area is painful.

## 2020-09-10 NOTE — Discharge Instructions (Addendum)
Follow up with your primary care provider if your symptoms are not improving.     

## 2020-09-15 ENCOUNTER — Other Ambulatory Visit: Payer: Self-pay | Admitting: Internal Medicine

## 2020-09-16 ENCOUNTER — Other Ambulatory Visit: Payer: Self-pay | Admitting: Internal Medicine

## 2020-09-16 DIAGNOSIS — E1169 Type 2 diabetes mellitus with other specified complication: Secondary | ICD-10-CM

## 2020-10-01 ENCOUNTER — Other Ambulatory Visit: Payer: Self-pay | Admitting: Internal Medicine

## 2020-10-01 DIAGNOSIS — G44209 Tension-type headache, unspecified, not intractable: Secondary | ICD-10-CM

## 2020-10-02 NOTE — Telephone Encounter (Signed)
dc'd 06/02/20 completed course [place], [place]

## 2020-11-30 ENCOUNTER — Encounter: Payer: Self-pay | Admitting: Internal Medicine

## 2020-11-30 ENCOUNTER — Other Ambulatory Visit: Payer: Self-pay

## 2020-11-30 ENCOUNTER — Ambulatory Visit: Payer: Managed Care, Other (non HMO) | Admitting: Internal Medicine

## 2020-11-30 VITALS — BP 132/80 | HR 71 | Ht 60.0 in | Wt 119.4 lb

## 2020-11-30 DIAGNOSIS — E1169 Type 2 diabetes mellitus with other specified complication: Secondary | ICD-10-CM | POA: Diagnosis not present

## 2020-11-30 DIAGNOSIS — F411 Generalized anxiety disorder: Secondary | ICD-10-CM

## 2020-11-30 DIAGNOSIS — E118 Type 2 diabetes mellitus with unspecified complications: Secondary | ICD-10-CM

## 2020-11-30 DIAGNOSIS — E785 Hyperlipidemia, unspecified: Secondary | ICD-10-CM

## 2020-11-30 DIAGNOSIS — I10 Essential (primary) hypertension: Secondary | ICD-10-CM

## 2020-11-30 DIAGNOSIS — Z23 Encounter for immunization: Secondary | ICD-10-CM

## 2020-11-30 LAB — POCT GLYCOSYLATED HEMOGLOBIN (HGB A1C): Hemoglobin A1C: 5.8 % — AB (ref 4.0–5.6)

## 2020-11-30 MED ORDER — BUSPIRONE HCL 10 MG PO TABS
10.0000 mg | ORAL_TABLET | Freq: Three times a day (TID) | ORAL | 0 refills | Status: DC
Start: 2020-11-30 — End: 2020-12-26

## 2020-11-30 NOTE — Progress Notes (Signed)
Date:  11/30/2020   Name:  Tracy Bryan   DOB:  Nov 03, 1960   MRN:  [id number]   Chief Complaint: Diabetes (POC4)  Diabetes She presents for her follow-up diabetic visit. She has type 2 diabetes mellitus. Her disease course has been stable. Hypoglycemia symptoms include nervousness/anxiousness. Pertinent negatives for hypoglycemia include no headaches or tremors. Pertinent negatives for diabetes include no chest pain, no fatigue, no polydipsia and no polyuria. Pertinent negatives for diabetic complications include no CVA. Current diabetic treatments: metformin and Trulicity. There is no change in her home blood glucose trend.  Hyperlipidemia This is a chronic problem. The problem is uncontrolled. Lipid results: high triglycerides. Pertinent negatives include no chest pain or shortness of breath. Current antihyperlipidemic treatment includes statins.  Hypertension This is a chronic problem. The problem has been gradually improving since onset. The problem is controlled. Associated symptoms include anxiety and peripheral edema (mild ankle swelling at the end of the day intermittently since starty amlodipine). Pertinent negatives include no chest pain, headaches, palpitations or shortness of breath. Past treatments include calcium channel blockers, angiotensin blockers and beta blockers. There are no compliance problems.  There is no history of kidney disease, CAD/MI or CVA.  Anxiety Presents for follow-up visit. Symptoms include decreased concentration, irritability, nervous/anxious behavior and obsessions. Patient reports no chest pain, palpitations or shortness of breath. Symptoms occur most days. The severity of symptoms is moderate.   Compliance with medications is 76-100% (on Cymbalta 60 mg).   Lab Results  Component Value Date   CREATININE 0.82 07/22/2020   BUN 17 07/22/2020   NA 139 07/22/2020   K 4.8 07/22/2020   CL 101 07/22/2020   CO2 24 07/22/2020   Lab Results   Component Value Date   CHOL 160 07/22/2020   HDL 46 07/22/2020   LDLCALC 62 07/22/2020   TRIG 338 (H) 07/22/2020   CHOLHDL 3.5 07/22/2020   Lab Results  Component Value Date   TSH 1.430 07/22/2020   Lab Results  Component Value Date   HGBA1C 5.8 (A) 11/30/2020   Lab Results  Component Value Date   WBC 7.4 07/22/2020   HGB 13.8 07/22/2020   HCT 41.1 07/22/2020   MCV 88 07/22/2020   PLT 302 07/22/2020   Lab Results  Component Value Date   ALT 17 07/22/2020   AST 16 07/22/2020   ALKPHOS 54 07/22/2020   BILITOT 0.3 07/22/2020     Review of Systems  Constitutional:  Positive for irritability. Negative for appetite change, fatigue, fever and unexpected weight change.  HENT:  Negative for tinnitus and trouble swallowing.   Eyes:  Negative for visual disturbance.  Respiratory:  Negative for cough, chest tightness and shortness of breath.   Cardiovascular:  Positive for leg swelling (mild intermittent). Negative for chest pain and palpitations.  Gastrointestinal:  Negative for abdominal pain.  Endocrine: Negative for polydipsia and polyuria.  Genitourinary:  Negative for dysuria and hematuria.  Musculoskeletal:  Negative for arthralgias.  Neurological:  Negative for tremors, numbness and headaches.  Psychiatric/Behavioral:  Positive for decreased concentration. Negative for dysphoric mood and sleep disturbance. The patient is nervous/anxious.    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   Sleep disorder 11/10/2019   Bilateral carpal tunnel syndrome 11/07/2016   Herpes simplex infection 11/23/2015   Fibromyalgia 03/11/2015   Menopause syndrome 02/17/2015   Esophageal reflux 02/17/2015   Vitamin D deficiency  02/17/2015   Hyperlipidemia associated with type 2 diabetes mellitus (HCC) 07/28/2014   Essential hypertension 07/28/2014   Type II diabetes mellitus with complication (HCC)  05/18/2014    Allergies  Allergen Reactions   Rabeprazole Sodium     C Diff Side Affects   Augmentin [Amoxicillin-Pot Clavulanate] Itching   Benazepril Hcl Cough   Penicillins Other (See Comments)   Jardiance [Empagliflozin] Rash    Recurrent yeast vaginitis    Past Surgical History:  Procedure Laterality Date   ABDOMINAL HYSTERECTOMY     CERVICAL DISC SURGERY     COLONOSCOPY  07/22/2013   DILATION AND CURETTAGE OF UTERUS  1996   HERNIA REPAIR  2000   Umblical   HIP ARTHROPLASTY Right    TOTAL HIP ARTHROPLASTY Left 05/18/2014   Procedure: LEFT TOTAL HIP ARTHROPLASTY ANTERIOR APPROACH;  Surgeon: Marcene Corning, MD;  Location: [facility] OR;  Service: Orthopedics;  Laterality: Left;    Social History   Tobacco Use   Smoking status: Former    Types: Cigarettes    Quit date: 06/14/1984    Years since quitting: 36.4   Smokeless tobacco: Never  Vaping Use   Vaping Use: Never used  Substance Use Topics   Alcohol use: No    Alcohol/week: 0.0 standard drinks   Drug use: No     Medication list has been reviewed and updated.  Current Meds  Medication Sig   amLODipine (NORVASC) 5 MG tablet Take 1 tablet (5 mg total) by mouth daily.   aspirin 81 MG tablet Take 81 mg by mouth daily.   atorvastatin (LIPITOR) 40 MG tablet TAKE 1 TABLET BY MOUTH EVERY DAY   Cholecalciferol (VITAMIN D) 2000 units CAPS Take 1 capsule (2,000 Units total) by mouth daily.   DULoxetine (CYMBALTA) 60 MG capsule TAKE 1 CAPSULE BY MOUTH EVERY DAY   glucose blood test strip 1 each by Other route daily. Use as instructed   lansoprazole (PREVACID) 15 MG capsule Take 15 mg by mouth daily at 12 noon.   losartan (COZAAR) 100 MG tablet TAKE 1 TABLET BY MOUTH EVERY DAY   metFORMIN (GLUCOPHAGE-XR) 500 MG 24 hr tablet TAKE 1 TABLET BY MOUTH TWICE A DAY   metoprolol succinate (TOPROL-XL) 100 MG 24 hr tablet TAKE 1/2 TABLET BY MOUTH TWICE A DAY WITH A MEAL   Multiple Vitamins-Minerals (MULTIVITAMIN WITH MINERALS) tablet Take  1 tablet by mouth daily.   TRULICITY 0.75 MG/0.5ML SOPN INJECT 0.75 MG INTO THE SKIN ONCE A WEEK.   valACYclovir (VALTREX) 1000 MG tablet TAKE 1 TABLET BY MOUTH TWICE A DAY    PHQ 2/9 Scores 11/30/2020 07/22/2020 05/10/2020 03/30/2020  PHQ - 2 Score 4 0 0 0  PHQ- 9 Score 16 2 0 4    GAD 7 : Generalized Anxiety Score 11/30/2020 07/22/2020 05/10/2020 03/30/2020  Nervous, Anxious, on Edge 3 2 0 2  Control/stop worrying 3 2 0 2  Worry too much - different things 3 2 0 2  Trouble relaxing 3 1 0 2  Restless 3 2 0 3  Easily annoyed or irritable 3 2 0 3  Afraid - awful might happen 2 1 0 0  Total GAD 7 Score 20 12 0 14  Anxiety Difficulty Somewhat difficult - Not difficult at all Somewhat difficult    BP Readings from Last 3 Encounters:  11/30/20 132/80  09/10/20 131/83  07/22/20 (!) 146/100    Physical Exam Vitals and nursing note reviewed.  Constitutional:      General:  She is not in acute distress.    Appearance: Normal appearance. She is well-developed.  HENT:     Head: Normocephalic and atraumatic.  Cardiovascular:     Rate and Rhythm: Normal rate and regular rhythm.     Pulses: Normal pulses.     Heart sounds: No murmur heard. Pulmonary:     Effort: Pulmonary effort is normal. No respiratory distress.     Breath sounds: No wheezing or rhonchi.  Musculoskeletal:     Cervical back: Normal range of motion.     Right lower leg: No edema.     Left lower leg: No edema.  Lymphadenopathy:     Cervical: No cervical adenopathy.  Skin:    General: Skin is warm and dry.     Capillary Refill: Capillary refill takes less than 2 seconds.     Findings: No rash.  Neurological:     Mental Status: She is alert and oriented to person, place, and time.  Psychiatric:        Mood and Affect: Mood is depressed.        Speech: Speech normal.        Behavior: Behavior normal.        Thought Content: Thought content does not include suicidal ideation. Thought content does not include suicidal  plan.        Cognition and Memory: Cognition normal.        Judgment: Judgment normal.    Wt Readings from Last 3 Encounters:  11/30/20 119 lb 6.4 oz (54.2 kg)  09/10/20 114 lb (51.7 kg)  07/22/20 115 lb (52.2 kg)    BP 132/80   Pulse 71   Ht 5' (1.524 m)   Wt 119 lb 6.4 oz (54.2 kg)   LMP  (LMP Unknown)   SpO2 99%   BMI 23.32 kg/m   Assessment and Plan: 1. Type II diabetes mellitus with complication (HCC) Clinically stable by exam and report without s/s of hypoglycemia. DM complicated by hypertension and dyslipidemia. Tolerating medications well without side effects or other concerns. - POCT glycosylated hemoglobin (Hb A1C) = 5.8 down from 6.1  2. Essential hypertension Clinically stable exam with well controlled BP since adding amlodipine. Tolerating medications without side effects at this time.  Will monitor for worsening of ankle edema. Pt to continue current regimen and low sodium diet; benefits of regular exercise as able discussed.  3. Hyperlipidemia associated with type 2 diabetes mellitus (HCC) On statin therapy with controlled LDL. However, Triglycerides are elevated - discussed starting Fenofibrate  4. Generalized anxiety disorder Continue Cymbalta; add buspar 10 mg daily for one week then increase to bid. Call to report benefit and request refills in one month - busPIRone (BUSPAR) 10 MG tablet; Take 1 tablet (10 mg total) by mouth 3 (three) times daily.  Dispense: 90 tablet; Refill: 0   Partially dictated using Animal nutritionist. Any errors are unintentional.  Bari Edward, MD [facility]  11/30/2020

## 2020-11-30 NOTE — Patient Instructions (Signed)
Buspirone 10 mg once a day for one week then increase to one twice a day.  Call for a refill after one month if helpful.

## 2020-12-14 ENCOUNTER — Ambulatory Visit: Payer: Self-pay | Admitting: *Deleted

## 2020-12-14 NOTE — Telephone Encounter (Signed)
Reports diarrhea x 3 days. No other symptoms . Patient would like to know if diarrhea could be caused by buspar or metformin. Please advise.   Called patient to review symptoms. Patient reports diarrhea x 3 days with abdominal cramping prior to diarrhea episode. Reports diarrhea every time she eats and drinks. Reports diarrhea watery and liquid. Denies blood in stool. Reports hemorrhoids . Diarrhea keeping patient up at night. Denies dizziness, constant abdominal cramping, no fever, no vomiting. C/o vomited x 1 Sunday after coughing from congestion and coughing up phlegm. Reports taking antibiotics after a dog bite x 1 month ago. Encouraged bland diet and to increase fluid intake if possible . Appt scheduled for 12/16/20. Care advise given. Patient verbalized understanding of care advise and to call back or go to [facility] or ED if symptoms worsen.

## 2020-12-14 NOTE — Telephone Encounter (Signed)
Reason for Disposition  [1] MODERATE diarrhea (e.g., 4-6 times / day more than normal) AND [2] present > 48 hours (2 days)  Answer Assessment - Initial Assessment Questions 1. DIARRHEA SEVERITY: "How bad is the diarrhea?" "How many more stools have you had in the past 24 hours than normal?"    - NO DIARRHEA (SCALE 0)   - MILD (SCALE 1-3): Few loose or mushy BMs; increase of 1-3 stools over normal daily number of stools; mild increase in ostomy output.   -  MODERATE (SCALE 4-7): Increase of 4-6 stools daily over normal; moderate increase in ostomy output. * SEVERE (SCALE 8-10; OR 'WORST POSSIBLE'): Increase of 7 or more stools daily over normal; moderate increase in ostomy output; incontinence.     Diarrhea after eating or drinking  2. ONSET: "When did the diarrhea begin?"      3 days ago  3. BM CONSISTENCY: "How loose or watery is the diarrhea?"      Watery and liquid  4. VOMITING: "Are you also vomiting?" If Yes, ask: "How many times in the past 24 hours?"      Not in the past 24 hours . Vomited Sunday due to congestion and coughing up phlegm. 5. ABDOMINAL PAIN: "Are you having any abdominal pain?" If Yes, ask: "What does it feel like?" (e.g., crampy, dull, intermittent, constant)      Cramping prior to diarrhea episode  6. ABDOMINAL PAIN SEVERITY: If present, ask: "How bad is the pain?"  (e.g., Scale 1-10; mild, moderate, or severe)   - MILD (1-3): doesn't interfere with normal activities, abdomen soft and not tender to touch    - MODERATE (4-7): interferes with normal activities or awakens from sleep, abdomen tender to touch    - SEVERE (8-10): excruciating pain, doubled over, unable to do any normal activities       Wakes up at night several times  7. ORAL INTAKE: If vomiting, "Have you been able to drink liquids?" "How much liquids have you had in the past 24 hours?"     Drinking some liquids  8. HYDRATION: "Any signs of dehydration?" (e.g., dry mouth [not just dry lips], too weak to  stand, dizziness, new weight loss) "When did you last urinate?"     Denies  9. EXPOSURE: "Have you traveled to a foreign country recently?" "Have you been exposed to anyone with diarrhea?" "Could you have eaten any food that was spoiled?"     na 10. ANTIBIOTIC USE: "Are you taking antibiotics now or have you taken antibiotics in the past 2 months?"       Dog bite on finger and took antibiotic x 1 month ago  11. OTHER SYMPTOMS: "Do you have any other symptoms?" (e.g., fever, blood in stool)       Hemorrhoids , denies blood in stool 12. PREGNANCY: "Is there any chance you are pregnant?" "When was your last menstrual period?"       na  Protocols used: [facility]

## 2020-12-16 ENCOUNTER — Ambulatory Visit: Payer: Managed Care, Other (non HMO) | Admitting: Internal Medicine

## 2020-12-18 ENCOUNTER — Other Ambulatory Visit: Payer: Self-pay | Admitting: Internal Medicine

## 2020-12-18 DIAGNOSIS — E118 Type 2 diabetes mellitus with unspecified complications: Secondary | ICD-10-CM

## 2020-12-18 NOTE — Telephone Encounter (Signed)
Requested Prescriptions  Pending Prescriptions Disp Refills  . TRULICITY 0.75 MG/0.5ML SOPN [Pharmacy Med Name: TRULICITY 0.75 MG/0.5 ML PEN] 15 mL 1    Sig: INJECT 0.75 MG INTO THE SKIN ONCE A WEEK.     Endocrinology:  Diabetes - GLP-1 Receptor Agonists Passed - 12/18/2020 11:12 AM      Passed - HBA1C is between 0 and 7.9 and within 180 days    Hemoglobin A1C  Date Value Ref Range Status  11/30/2020 5.8 (A) 4.0 - 5.6 % Final   HB A1C (BAYER DCA - WAIVED)  Date Value Ref Range Status  07/28/2014 6.9 <7.0 % Final    Comment:                                          Diabetic Adult            <7.0                                       Healthy Adult        4.3 - 5.7                                                           (DCCT/NGSP) American Diabetes Association's Summary of Glycemic Recommendations for Adults with Diabetes: Hemoglobin A1c <7.0%. More stringent glycemic goals (A1c <6.0%) may further reduce complications at the cost of increased risk of hypoglycemia.    Hgb A1c MFr Bld  Date Value Ref Range Status  07/22/2020 6.1 (H) 4.8 - 5.6 % Final    Comment:             Prediabetes: 5.7 - 6.4          Diabetes: >6.4          Glycemic control for adults with diabetes: <7.0          Passed - Valid encounter within last 6 months    Recent Outpatient Visits          2 weeks ago Type II diabetes mellitus with complication [facility])   [facility] Reubin Milan, MD   4 months ago Annual physical exam   [facility] Reubin Milan, MD   7 months ago Acute non-recurrent maxillary sinusitis   [facility] Reubin Milan, MD   8 months ago Type II diabetes mellitus with complication [facility])   [facility] Reubin Milan, MD   1 year ago Type II diabetes mellitus with complication [facility])   [facility] Reubin Milan, MD      Future Appointments            In 3 months Judithann Graves Nyoka Cowden, MD [facility]   In  7 months Judithann Graves, Nyoka Cowden, MD [facility]

## 2020-12-23 ENCOUNTER — Other Ambulatory Visit: Payer: Self-pay | Admitting: Internal Medicine

## 2020-12-23 DIAGNOSIS — F411 Generalized anxiety disorder: Secondary | ICD-10-CM

## 2020-12-24 NOTE — Telephone Encounter (Signed)
Requested medications are due for refill today yes  Requested medications are on the active medication list yes  Last refill 11/30/20  Last visit 11/30/20  Future visit scheduled 04/03/21  Notes to clinic Pt was to call in one month to request more if med helping, pt had appt and canceled, there is no call on record, not scheduled again until 03/2021, please assess. Requested Prescriptions  Pending Prescriptions Disp Refills   busPIRone (BUSPAR) 10 MG tablet [Pharmacy Med Name: BUSPIRONE HCL 10 MG TABLET] 270 tablet 1    Sig: TAKE 1 TABLET BY MOUTH THREE TIMES A DAY     Psychiatry: Anxiolytics/Hypnotics - Non-controlled Passed - 12/23/2020  2:37 PM      Passed - Valid encounter within last 6 months    Recent Outpatient Visits           3 weeks ago Type II diabetes mellitus with complication [facility])   [facility] Reubin Milan, MD   5 months ago Annual physical exam   [facility] Reubin Milan, MD   7 months ago Acute non-recurrent maxillary sinusitis   [facility] Reubin Milan, MD   8 months ago Type II diabetes mellitus with complication [facility])   [facility] Reubin Milan, MD   1 year ago Type II diabetes mellitus with complication [facility])   [facility] Reubin Milan, MD       Future Appointments             In 3 months Judithann Graves Nyoka Cowden, MD [facility], [facility]   In 7 months Judithann Graves, Nyoka Cowden, MD [facility], [facility]

## 2021-01-05 ENCOUNTER — Other Ambulatory Visit: Payer: Self-pay | Admitting: Internal Medicine

## 2021-01-05 DIAGNOSIS — I10 Essential (primary) hypertension: Secondary | ICD-10-CM

## 2021-01-05 NOTE — Telephone Encounter (Signed)
Requested Prescriptions  Pending Prescriptions Disp Refills  . amLODipine (NORVASC) 5 MG tablet [Pharmacy Med Name: AMLODIPINE BESYLATE 5 MG TAB] 90 tablet 1    Sig: TAKE 1 TABLET (5 MG TOTAL) BY MOUTH DAILY.     Cardiovascular:  Calcium Channel Blockers Passed - 01/05/2021  1:31 AM      Passed - Last BP in normal range    BP Readings from Last 1 Encounters:  11/30/20 132/80         Passed - Valid encounter within last 6 months    Recent Outpatient Visits          1 month ago Type II diabetes mellitus with complication [facility])   [facility] Reubin Milan, MD   5 months ago Annual physical exam   [facility] Reubin Milan, MD   8 months ago Acute non-recurrent maxillary sinusitis   [facility] Reubin Milan, MD   9 months ago Type II diabetes mellitus with complication [facility])   [facility] Reubin Milan, MD   1 year ago Type II diabetes mellitus with complication [facility])   [facility] Reubin Milan, MD      Future Appointments            In 2 months Judithann Graves Nyoka Cowden, MD [facility], [facility]   In 6 months Judithann Graves Nyoka Cowden, MD [facility], [facility]

## 2021-02-01 ENCOUNTER — Other Ambulatory Visit: Payer: Self-pay | Admitting: Internal Medicine

## 2021-02-01 DIAGNOSIS — E118 Type 2 diabetes mellitus with unspecified complications: Secondary | ICD-10-CM

## 2021-02-01 NOTE — Telephone Encounter (Signed)
Requested Prescriptions  Pending Prescriptions Disp Refills   metFORMIN (GLUCOPHAGE-XR) 500 MG 24 hr tablet [Pharmacy Med Name: METFORMIN HCL ER 500 MG TABLET] 180 tablet 1    Sig: TAKE 1 TABLET BY MOUTH TWICE A DAY     Endocrinology:  Diabetes - Biguanides Passed - 02/01/2021  1:13 AM      Passed - Cr in normal range and within 360 days    Creatinine  Date Value Ref Range Status  07/08/2013 0.92 0.60 - 1.30 mg/dL Final   Creatinine, Ser  Date Value Ref Range Status  07/22/2020 0.82 0.57 - 1.00 mg/dL Final         Passed - HBA1C is between 0 and 7.9 and within 180 days    Hemoglobin A1C  Date Value Ref Range Status  11/30/2020 5.8 (A) 4.0 - 5.6 % Final   HB A1C (BAYER DCA - WAIVED)  Date Value Ref Range Status  07/28/2014 6.9 <7.0 % Final    Comment:                                          Diabetic Adult            <7.0                                       Healthy Adult        4.3 - 5.7                                                           (DCCT/NGSP) American Diabetes Association's Summary of Glycemic Recommendations for Adults with Diabetes: Hemoglobin A1c <7.0%. More stringent glycemic goals (A1c <6.0%) may further reduce complications at the cost of increased risk of hypoglycemia.    Hgb A1c MFr Bld  Date Value Ref Range Status  07/22/2020 6.1 (H) 4.8 - 5.6 % Final    Comment:             Prediabetes: 5.7 - 6.4          Diabetes: >6.4          Glycemic control for adults with diabetes: <7.0          Passed - eGFR in normal range and within 360 days    EGFR (African American)  Date Value Ref Range Status  07/08/2013 >60  Final   GFR calc Af Amer  Date Value Ref Range Status  07/21/2019 89 >59 mL/min/1.73 Final    Comment:    **[facility] currently reports eGFR in compliance with the current**   recommendations of the [employer]. [facility] will   update reporting as new guidelines are published from the NKF-ASN   Task force.    EGFR  (Non-African Amer.)  Date Value Ref Range Status  07/08/2013 >60  Final    Comment:    eGFR values <68m/min/1.73 m2 may be an indication of chronic kidney disease (CKD). Calculated eGFR is useful in patients with stable renal function. The eGFR calculation will not be reliable in acutely ill patients when serum creatinine is changing rapidly. It is not useful  in  °patients on dialysis. The eGFR calculation may not be applicable °to patients at the low and high extremes of body sizes, pregnant °women, and vegetarians. °  ° °GFR calc non Af Amer  °Date Value Ref Range Status  °07/21/2019 77 >59 mL/min/1.73 Final  ° °eGFR  °Date Value Ref Range Status  °07/22/2020 81 >59 mL/min/1.73 Final  °   °  °  Passed - Valid encounter within last 6 months  °  Recent Outpatient Visits   °      ° 2 months ago Type II diabetes mellitus with complication (HCC)  ° [facility] Berglund, Laura H, MD  ° 6 months ago Annual physical exam  ° [facility] Berglund, Laura H, MD  ° 8 months ago Acute non-recurrent maxillary sinusitis  ° [facility] Berglund, Laura H, MD  ° 10 months ago Type II diabetes mellitus with complication (HCC)  ° [facility] Berglund, Laura H, MD  ° 1 year ago Type II diabetes mellitus with complication (HCC)  ° [facility] Berglund, Laura H, MD  °  °  °Future Appointments   °        ° In 2 months Berglund, Laura H, MD [facility], [facility]  ° In 5 months Berglund, Laura H, MD [facility], [facility]  °  ° °  °  °  ° °

## 2021-03-01 ENCOUNTER — Other Ambulatory Visit: Payer: Self-pay | Admitting: Internal Medicine

## 2021-03-01 DIAGNOSIS — Z1231 Encounter for screening mammogram for malignant neoplasm of breast: Secondary | ICD-10-CM

## 2021-03-07 ENCOUNTER — Other Ambulatory Visit: Payer: Self-pay | Admitting: Internal Medicine

## 2021-03-07 DIAGNOSIS — I10 Essential (primary) hypertension: Secondary | ICD-10-CM

## 2021-03-07 DIAGNOSIS — F411 Generalized anxiety disorder: Secondary | ICD-10-CM

## 2021-03-07 NOTE — Telephone Encounter (Signed)
Requested medication (s) are due for refill today:   Yes for losartan,  no for Buspar and metoprolol  Requested medication (s) are on the active medication list:   Yes for all 3  Future visit scheduled:   Yes for 04/03/2021   Last ordered: Losartan 01/31/2020 #90, 3 refills;   Buspar 12/26/2020 #270, 0 refills;   metoprolol 09/15/2020 #90, 1 refill  Returned because failed protocol due to potassium and Cr. Not within 180 days.   Provider to review for refills prior to appt.   Requested Prescriptions  Pending Prescriptions Disp Refills   losartan (COZAAR) 100 MG tablet [Pharmacy Med Name: LOSARTAN POTASSIUM 100 MG TAB] 90 tablet 3    Sig: TAKE 1 TABLET BY MOUTH EVERY DAY     Cardiovascular:  Angiotensin Receptor Blockers Failed - 03/07/2021  8:40 AM      Failed - Cr in normal range and within 180 days    Creatinine  Date Value Ref Range Status  07/08/2013 0.92 0.60 - 1.30 mg/dL Final   Creatinine, Ser  Date Value Ref Range Status  07/22/2020 0.82 0.57 - 1.00 mg/dL Final          Failed - K in normal range and within 180 days    Potassium  Date Value Ref Range Status  07/22/2020 4.8 3.5 - 5.2 mmol/L Final  07/08/2013 3.5 3.5 - 5.1 mmol/L Final          Passed - Patient is not pregnant      Passed - Last BP in normal range    BP Readings from Last 1 Encounters:  11/30/20 132/80          Passed - Valid encounter within last 6 months    Recent Outpatient Visits           3 months ago Type II diabetes mellitus with complication [facility])   [facility] Reubin Milan, MD   7 months ago Annual physical exam   [facility] Reubin Milan, MD   10 months ago Acute non-recurrent maxillary sinusitis   [facility] Reubin Milan, MD   11 months ago Type II diabetes mellitus with complication [facility])   [facility] Reubin Milan, MD   1 year ago Type II diabetes mellitus with complication [facility])   [facility] Reubin Milan, MD       Future Appointments             In 3 weeks Judithann Graves Nyoka Cowden, MD [facility], [facility]   In 4 months Judithann Graves, Nyoka Cowden, MD [facility], [facility]             busPIRone (BUSPAR) 10 MG tablet [Pharmacy Med Name: BUSPIRONE HCL 10 MG TABLET] 270 tablet 0    Sig: TAKE 1 TABLET BY MOUTH THREE TIMES A DAY     Psychiatry: Anxiolytics/Hypnotics - Non-controlled Passed - 03/07/2021  8:40 AM      Passed - Valid encounter within last 6 months    Recent Outpatient Visits           3 months ago Type II diabetes mellitus with complication [facility])   [facility] Reubin Milan, MD   7 months ago Annual physical exam   [facility] Re[facility] Milan, MD   10 months ago Acute non-recurrent maxillary sinusitis   [facility] Reubin Milan, MD   11 months ago Type II diabetes mellitus  with complication [facility])   [facility] Reubin Milan, MD   1 year ago Type II diabetes mellitus with complication [facility])   [facility] Reubin Milan, MD       Future Appointments             In 3 weeks Judithann Graves Nyoka Cowden, MD [facility], [facility]   In 4 months Reubin Milan, MD [facility], [facility]             metoprolol succinate (TOPROL-XL) 100 MG 24 hr tablet [Pharmacy Med Name: METOPROLOL SUCC ER 100 MG TAB] 90 tablet 1    Sig: TAKE 1/2 TABLET BY MOUTH TWICE A DAY WITH A MEAL     Cardiovascular:  Beta Blockers Passed - 03/07/2021  8:40 AM      Passed - Last BP in normal range    BP Readings from Last 1 Encounters:  11/30/20 132/80          Passed - Last Heart Rate in normal range    Pulse Readings from Last 1 Encounters:  11/30/20 71          Passed - Valid encounter within last 6 months    Recent Outpatient Visits           3 months ago Type II diabetes mellitus with complication [facility])   [facility] Reubin Milan, MD   7 months ago Annual physical exam   [facility] Reubin Milan, MD   10 months ago Acute non-recurrent maxillary sinusitis   [facility] Reubin Milan, MD   11 months ago Type II diabetes mellitus with complication [facility])   [facility] Reubin Milan, MD   1 year ago Type II diabetes mellitus with complication [facility])   [facility] Reubin Milan, MD       Future Appointments             In 3 weeks Judithann Graves Nyoka Cowden, MD [facility], [facility]   In 4 months Judithann Graves, Nyoka Cowden, MD [facility], [facility]

## 2021-03-22 ENCOUNTER — Other Ambulatory Visit: Payer: Self-pay | Admitting: Internal Medicine

## 2021-03-22 DIAGNOSIS — E118 Type 2 diabetes mellitus with unspecified complications: Secondary | ICD-10-CM

## 2021-03-22 DIAGNOSIS — B009 Herpesviral infection, unspecified: Secondary | ICD-10-CM

## 2021-03-22 NOTE — Telephone Encounter (Signed)
Requesting Trulicity too soon. Requested Prescriptions  Pending Prescriptions Disp Refills   valACYclovir (VALTREX) 1000 MG tablet [Pharmacy Med Name: VALACYCLOVIR HCL 1 GRAM TABLET] 20 tablet 0    Sig: TAKE 1 TABLET BY MOUTH TWICE A DAY     Antimicrobials:  Antiviral Agents - Anti-Herpetic Passed - 03/22/2021  5:25 PM      Passed - Valid encounter within last 12 months    Recent Outpatient Visits          3 months ago Type II diabetes mellitus with complication [facility])   [facility] Reubin Milan, MD   8 months ago Annual physical exam   [facility] Reubin Milan, MD   10 months ago Acute non-recurrent maxillary sinusitis   [facility] Reubin Milan, MD   11 months ago Type II diabetes mellitus with complication [facility])   [facility] Reubin Milan, MD   1 year ago Type II diabetes mellitus with complication [facility])   [facility] Reubin Milan, MD      Future Appointments            In 1 week Judithann Graves Nyoka Cowden, MD [facility], [facility]   In 4 months Judithann Graves Nyoka Cowden, MD [facility], [facility]           Refused Prescriptions Disp Refills   TRULICITY 0.75 MG/0.5ML SOPN [Pharmacy Med Name: TRULICITY 0.75 MG/0.5 ML PEN]  1    Sig: INJECT 0.75 MG INTO THE SKIN ONCE A WEEK.     Endocrinology:  Diabetes - GLP-1 Receptor Agonists Passed - 03/22/2021  5:25 PM      Passed - HBA1C is between 0 and 7.9 and within 180 days    Hemoglobin A1C  Date Value Ref Range Status  11/30/2020 5.8 (A) 4.0 - 5.6 % Final   HB A1C (BAYER DCA - WAIVED)  Date Value Ref Range Status  07/28/2014 6.9 <7.0 % Final    Comment:                                          Diabetic Adult            <7.0                                       Healthy Adult        4.3 - 5.7                                                           (DCCT/NGSP) American Diabetes Association's Summary of Glycemic Recommendations for Adults with Diabetes:  Hemoglobin A1c <7.0%. More stringent glycemic goals (A1c <6.0%) may further reduce complications at the cost of increased risk of hypoglycemia.    Hgb A1c MFr Bld  Date Value Ref Range Status  07/22/2020 6.1 (H) 4.8 - 5.6 % Final    Comment:             Prediabetes: 5.7 - 6.4          Diabetes: >6.4  Glycemic control for adults with diabetes: <7.0          Passed - Valid encounter within last 6 months    Recent Outpatient Visits          3 months ago Type II diabetes mellitus with complication [facility])   [facility] Reubin Milan, MD   8 months ago Annual physical exam   [facility] Reubin Milan, MD   10 months ago Acute non-recurrent maxillary sinusitis   [facility] Reubin Milan, MD   11 months ago Type II diabetes mellitus with complication [facility])   [facility] Reubin Milan, MD   1 year ago Type II diabetes mellitus with complication [facility])   [facility] Reubin Milan, MD      Future Appointments            In 1 week Judithann Graves Nyoka Cowden, MD [facility], [facility]   In 4 months Judithann Graves Nyoka Cowden, MD [facility], [facility]

## 2021-03-29 ENCOUNTER — Ambulatory Visit
Admission: RE | Admit: 2021-03-29 | Discharge: 2021-03-29 | Disposition: A | Discharge status: Home / Self Care | Payer: Managed Care, Other (non HMO) | Source: Ambulatory Visit | Attending: Internal Medicine | Admitting: Internal Medicine

## 2021-03-29 ENCOUNTER — Other Ambulatory Visit: Payer: Self-pay

## 2021-03-29 DIAGNOSIS — Z1231 Encounter for screening mammogram for malignant neoplasm of breast: Secondary | ICD-10-CM | POA: Insufficient documentation

## 2021-04-03 ENCOUNTER — Ambulatory Visit (INDEPENDENT_AMBULATORY_CARE_PROVIDER_SITE_OTHER): Payer: Managed Care, Other (non HMO) | Admitting: Internal Medicine

## 2021-04-03 ENCOUNTER — Encounter: Payer: Self-pay | Admitting: Internal Medicine

## 2021-04-03 ENCOUNTER — Other Ambulatory Visit: Payer: Self-pay

## 2021-04-03 VITALS — BP 104/70 | HR 73 | Ht 60.0 in | Wt 116.0 lb

## 2021-04-03 DIAGNOSIS — E1169 Type 2 diabetes mellitus with other specified complication: Secondary | ICD-10-CM | POA: Diagnosis not present

## 2021-04-03 DIAGNOSIS — E785 Hyperlipidemia, unspecified: Secondary | ICD-10-CM

## 2021-04-03 DIAGNOSIS — Z23 Encounter for immunization: Secondary | ICD-10-CM

## 2021-04-03 DIAGNOSIS — E118 Type 2 diabetes mellitus with unspecified complications: Secondary | ICD-10-CM | POA: Diagnosis not present

## 2021-04-03 DIAGNOSIS — I10 Essential (primary) hypertension: Secondary | ICD-10-CM | POA: Diagnosis not present

## 2021-04-03 MED ORDER — ICOSAPENT ETHYL 0.5 G PO CAPS: 2.0000 | ORAL_CAPSULE | Freq: Two times a day (BID) | ORAL | 0 refills | Status: DC

## 2021-04-03 NOTE — Progress Notes (Signed)
Date:  04/03/2021   Name:  Tracy Bryan   DOB:  Apr 21, 1960   MRN:  [id number]   Chief Complaint: Diabetes and Hyperlipidemia  Diabetes She presents for her follow-up diabetic visit. She has type 2 diabetes mellitus. Pertinent negatives for hypoglycemia include no headaches or tremors. Pertinent negatives for diabetes include no chest pain, no fatigue, no polydipsia and no polyuria. Current diabetic treatment includes oral agent (monotherapy) (and trulicity). Her weight is stable. An ACE inhibitor/angiotensin II receptor blocker is being taken. Eye exam is current.  Hyperlipidemia This is a chronic problem. The problem is uncontrolled (LDL good but Triglycerides are high). Exacerbating diseases include diabetes. She has no history of chronic renal disease. Pertinent negatives include no chest pain or shortness of breath. Current antihyperlipidemic treatment includes statins. The current treatment provides significant improvement of lipids. There are no compliance problems.   Hypertension This is a chronic problem. The problem is controlled. Pertinent negatives include no chest pain, headaches, palpitations or shortness of breath. There is no history of chronic renal disease.   Lab Results  Component Value Date   NA 139 07/22/2020   K 4.8 07/22/2020   CO2 24 07/22/2020   GLUCOSE 107 (H) 07/22/2020   BUN 17 07/22/2020   CREATININE 0.82 07/22/2020   CALCIUM 9.6 07/22/2020   EGFR 81 07/22/2020   GFRNONAA 77 07/21/2019   Lab Results  Component Value Date   CHOL 160 07/22/2020   HDL 46 07/22/2020   LDLCALC 62 07/22/2020   TRIG 338 (H) 07/22/2020   CHOLHDL 3.5 07/22/2020   Lab Results  Component Value Date   TSH 1.430 07/22/2020   Lab Results  Component Value Date   HGBA1C 5.8 (A) 11/30/2020   Lab Results  Component Value Date   WBC 7.4 07/22/2020   HGB 13.8 07/22/2020   HCT 41.1 07/22/2020   MCV 88 07/22/2020   PLT 302 07/22/2020   Lab Results  Component Value  Date   ALT 17 07/22/2020   AST 16 07/22/2020   ALKPHOS 54 07/22/2020   BILITOT 0.3 07/22/2020   Lab Results  Component Value Date   VD25OH 52.8 05/28/2017     Review of Systems  Constitutional:  Negative for appetite change, fatigue, fever and unexpected weight change.  HENT:  Negative for tinnitus and trouble swallowing.   Eyes:  Negative for visual disturbance.  Respiratory:  Negative for cough, chest tightness and shortness of breath.   Cardiovascular:  Negative for chest pain, palpitations and leg swelling.  Gastrointestinal:  Negative for abdominal pain.  Endocrine: Negative for polydipsia and polyuria.  Genitourinary:  Negative for dysuria and hematuria.  Musculoskeletal:  Negative for arthralgias.  Neurological:  Negative for tremors, numbness and headaches.  Psychiatric/Behavioral:  Negative for dysphoric mood.    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   Sleep disorder 11/10/2019   Bilateral carpal tunnel syndrome 11/07/2016   Herpes simplex infection 11/23/2015   Fibromyalgia 03/11/2015   Menopause syndrome 02/17/2015   Esophageal reflux 02/17/2015   Vitamin D deficiency 02/17/2015   Hyperlipidemia associated with type 2 diabetes mellitus ([place]) 07/28/2014   Essential hypertension 07/28/2014   Type II diabetes mellitus with complication (HCC) 40/98/1191    Allergies  Allergen Reactions   Rabeprazole Sodium     C Diff Side Affects   Augmentin [Amoxicillin-Pot Clavulanate] Itching   Benazepril Hcl Cough   Penicillins Other (  See Comments)   Jardiance [Empagliflozin] Rash    Recurrent yeast vaginitis    Past Surgical History:  Procedure Laterality Date   ABDOMINAL HYSTERECTOMY     CERVICAL DISC SURGERY     COLONOSCOPY  07/22/2013   DILATION AND CURETTAGE OF UTERUS  1996   HERNIA REPAIR  6004   Umblical   HIP ARTHROPLASTY Right    TOTAL HIP ARTHROPLASTY Left 05/18/2014    Procedure: LEFT TOTAL HIP ARTHROPLASTY ANTERIOR APPROACH;  Surgeon: Melrose Nakayama, MD;  Location: [place];  Service: Orthopedics;  Laterality: Left;    Social History   Tobacco Use   Smoking status: Former    Types: Cigarettes    Quit date: 06/14/1984    Years since quitting: 36.8   Smokeless tobacco: Never  Vaping Use   Vaping Use: Never used  Substance Use Topics   Alcohol use: No    Alcohol/week: 0.0 standard drinks   Drug use: No     Medication list has been reviewed and updated.  Current Meds  Medication Sig   amLODipine (NORVASC) 5 MG tablet TAKE 1 TABLET (5 MG TOTAL) BY MOUTH DAILY.   aspirin 81 MG tablet Take 81 mg by mouth daily.   atorvastatin (LIPITOR) 40 MG tablet TAKE 1 TABLET BY MOUTH EVERY DAY   busPIRone (BUSPAR) 10 MG tablet Take 1 tablet (10 mg total) by mouth 3 (three) times daily.   Cholecalciferol (VITAMIN D) 2000 units CAPS Take 1 capsule (2,000 Units total) by mouth daily.   DULoxetine (CYMBALTA) 60 MG capsule TAKE 1 CAPSULE BY MOUTH EVERY DAY   glucose blood test strip 1 each by Other route daily. Use as instructed   Icosapent Ethyl (VASCEPA) 0.5 g CAPS Take 2 capsules by mouth 2 (two) times daily.   lansoprazole (PREVACID) 15 MG capsule Take 15 mg by mouth daily at 12 noon.   losartan (COZAAR) 100 MG tablet Take 1 tablet (100 mg total) by mouth daily.   metFORMIN (GLUCOPHAGE-XR) 500 MG 24 hr tablet TAKE 1 TABLET BY MOUTH TWICE A DAY   metoprolol succinate (TOPROL-XL) 100 MG 24 hr tablet Take 0.5 tablets (50 mg total) by mouth in the morning and at bedtime.   Multiple Vitamins-Minerals (MULTIVITAMIN WITH MINERALS) tablet Take 1 tablet by mouth daily.   TRULICITY 5.99 HF/4.1SE SOPN INJECT 0.75 MG INTO THE SKIN ONCE A WEEK.   valACYclovir (VALTREX) 1000 MG tablet TAKE 1 TABLET BY MOUTH TWICE A DAY    PHQ 2/9 Scores 04/03/2021 11/30/2020 07/22/2020 05/10/2020  PHQ - 2 Score 1 4 0 0  PHQ- 9 Score _0 0    GAD 7 : Generalized Anxiety Score 04/03/2021  11/30/2020 07/22/2020 05/10/2020  Nervous, Anxious, on Edge _1 0  Control/stop worrying _2 0  Worry too much - different things _3 0  Trouble relaxing _4 0  Restless _5 0  Easily annoyed or irritable _6 0  Afraid - awful might happen 0 2 1 0  Total GAD 7 Score _7 0  Anxiety Difficulty - Somewhat difficult - Not difficult at all    BP Readings from Last 3 Encounters:  04/03/21 104/70  11/30/20 132/80  09/10/20 131/83    Physical Exam Vitals and nursing note reviewed.  Constitutional:      General: She is not in acute distress.    Appearance: Normal appearance. She is well-developed.  HENT:     Head:  Normocephalic and atraumatic.  Neck:     Vascular: No carotid bruit.  Cardiovascular:     Rate and Rhythm: Normal rate and regular rhythm.     Pulses: Normal pulses.     Heart sounds: No murmur heard. Pulmonary:     Effort: Pulmonary effort is normal. No respiratory distress.     Breath sounds: No wheezing or rhonchi.  Musculoskeletal:     Cervical back: Normal range of motion.     Right lower leg: No edema.     Left lower leg: No edema.  Lymphadenopathy:     Cervical: No cervical adenopathy.  Skin:    General: Skin is warm and dry.     Capillary Refill: Capillary refill takes less than 2 seconds.     Findings: No rash.  Neurological:     General: No focal deficit present.     Mental Status: She is alert and oriented to person, place, and time.  Psychiatric:        Mood and Affect: Mood normal.        Behavior: Behavior normal.    Wt Readings from Last 3 Encounters:  04/03/21 116 lb (52.6 kg)  11/30/20 119 lb 6.4 oz (54.2 kg)  09/10/20 114 lb (51.7 kg)    BP 104/70    Pulse 73    Ht 5' (1.524 m)    Wt 116 lb (52.6 kg)    LMP  (LMP Unknown)    SpO2 99%    BMI 22.65 kg/m   Assessment and Plan: 1. Essential hypertension Clinically stable exam with well controlled BP. Tolerating medications without side effects at this time. Pt to continue  current regimen and low sodium diet; benefits of regular exercise as able discussed.  2. Type II diabetes mellitus with complication ([place]) Clinically stable by exam and report without s/s of hypoglycemia. DM complicated by hypertension and dyslipidemia. Tolerating medications well without side effects or other concerns.  Continue current dose of metformin and Trulicity. - Basic metabolic panel - Hemoglobin A1c - Microalbumin / creatinine urine ratio  3. Hyperlipidemia associated with type 2 diabetes mellitus ([place]) LDL controlled on lipitor but triglycerides are high. Pt is agreeable to starting Vascepa.  If too costly or not covered, will prescribe Tricor. - Lipid panel - Icosapent Ethyl (VASCEPA) 0.5 g CAPS; Take 2 capsules by mouth 2 (two) times daily.  Dispense: 360 capsule; Refill: 0  4. Need for shingles vaccine First dose today Second dose in June at her CPX appt. - Varicella-zoster vaccine IM   Partially dictated using Editor, commissioning. Any errors are unintentional.  Halina Maidens, MD [facility]  04/03/2021

## 2021-04-04 LAB — BASIC METABOLIC PANEL
BUN/Creatinine Ratio: 18 (ref 12–28)
BUN: 16 mg/dL (ref 8–27)
CO2: 25 mmol/L (ref 20–29)
Calcium: 9.8 mg/dL (ref 8.7–10.3)
Chloride: 102 mmol/L (ref 96–106)
Creatinine, Ser: 0.87 mg/dL (ref 0.57–1.00)
Glucose: 90 mg/dL (ref 70–99)
Potassium: 4.7 mmol/L (ref 3.5–5.2)
Sodium: 141 mmol/L (ref 134–144)
eGFR: 75 mL/min/{1.73_m2} (ref >59)

## 2021-04-04 LAB — HEMOGLOBIN A1C
Est. average glucose Bld gHb Est-mCnc: 128 mg/dL
Hgb A1c MFr Bld: 6.1 % — ABNORMAL HIGH (ref 4.8–5.6)

## 2021-04-04 LAB — LIPID PANEL
Chol/HDL Ratio: 3.2 ratio (ref 0.0–4.4)
Cholesterol, Total: 156 mg/dL (ref 100–199)
HDL: 49 mg/dL (ref >39)
LDL Chol Calc (NIH): 64 mg/dL (ref 0–99)
Triglycerides: 270 mg/dL — ABNORMAL HIGH (ref 0–149)
VLDL Cholesterol Cal: 43 mg/dL — ABNORMAL HIGH (ref 5–40)

## 2021-04-04 LAB — MICROALBUMIN / CREATININE URINE RATIO
Creatinine, Urine: 76.7 mg/dL
Microalb/Creat Ratio: 49 mg/g creat — ABNORMAL HIGH (ref 0–29)
Microalbumin, Urine: 37.3 ug/mL

## 2021-04-14 LAB — HM DIABETES EYE EXAM

## 2021-05-06 ENCOUNTER — Encounter: Payer: Self-pay | Admitting: Emergency Medicine

## 2021-05-06 ENCOUNTER — Other Ambulatory Visit: Payer: Self-pay

## 2021-05-06 ENCOUNTER — Emergency Department: Payer: Managed Care, Other (non HMO)

## 2021-05-06 ENCOUNTER — Emergency Department
Admission: EM | Admit: 2021-05-06 | Discharge: 2021-05-06 | Disposition: A | Discharge status: Home / Self Care | Payer: Managed Care, Other (non HMO) | Attending: Emergency Medicine | Admitting: Emergency Medicine

## 2021-05-06 ENCOUNTER — Ambulatory Visit
Admission: EM | Admit: 2021-05-06 | Discharge: 2021-05-06 | Disposition: A | Discharge status: Home / Self Care | Payer: Managed Care, Other (non HMO)

## 2021-05-06 DIAGNOSIS — E119 Type 2 diabetes mellitus without complications: Secondary | ICD-10-CM | POA: Diagnosis not present

## 2021-05-06 DIAGNOSIS — R110 Nausea: Secondary | ICD-10-CM | POA: Diagnosis not present

## 2021-05-06 DIAGNOSIS — K760 Fatty (change of) liver, not elsewhere classified: Secondary | ICD-10-CM | POA: Insufficient documentation

## 2021-05-06 DIAGNOSIS — E860 Dehydration: Secondary | ICD-10-CM | POA: Diagnosis not present

## 2021-05-06 DIAGNOSIS — I10 Essential (primary) hypertension: Secondary | ICD-10-CM | POA: Insufficient documentation

## 2021-05-06 DIAGNOSIS — K644 Residual hemorrhoidal skin tags: Secondary | ICD-10-CM | POA: Diagnosis not present

## 2021-05-06 DIAGNOSIS — R197 Diarrhea, unspecified: Secondary | ICD-10-CM | POA: Insufficient documentation

## 2021-05-06 DIAGNOSIS — R1010 Upper abdominal pain, unspecified: Secondary | ICD-10-CM | POA: Diagnosis not present

## 2021-05-06 LAB — GASTROINTESTINAL PANEL BY PCR, STOOL (REPLACES STOOL CULTURE)

## 2021-05-06 LAB — LIPASE, BLOOD: Lipase: 78 U/L — ABNORMAL HIGH (ref 11–51)

## 2021-05-06 LAB — COMPREHENSIVE METABOLIC PANEL
ALT: 27 U/L (ref 0–44)
AST: 26 U/L (ref 15–41)
Albumin: 4.5 g/dL (ref 3.5–5.0)
Alkaline Phosphatase: 54 U/L (ref 38–126)
Anion gap: 11 (ref 5–15)
BUN: 38 mg/dL — ABNORMAL HIGH (ref 8–23)
CO2: 17 mmol/L — ABNORMAL LOW (ref 22–32)
Calcium: 10.1 mg/dL (ref 8.9–10.3)
Chloride: 107 mmol/L (ref 98–111)
Creatinine, Ser: 1.2 mg/dL — ABNORMAL HIGH (ref 0.44–1.00)
GFR, Estimated: 51 mL/min — ABNORMAL LOW (ref >60)
Glucose, Bld: 121 mg/dL — ABNORMAL HIGH (ref 70–99)
Potassium: 5.1 mmol/L (ref 3.5–5.1)
Sodium: 135 mmol/L (ref 135–145)
Total Bilirubin: 0.9 mg/dL (ref 0.3–1.2)
Total Protein: 8.4 g/dL — ABNORMAL HIGH (ref 6.5–8.1)

## 2021-05-06 LAB — CBC
HCT: 43.6 % (ref 36.0–46.0)
Hemoglobin: 14.4 g/dL (ref 12.0–15.0)
MCH: 29 pg (ref 26.0–34.0)
MCHC: 33 g/dL (ref 30.0–36.0)
MCV: 87.7 fL (ref 80.0–100.0)
Platelets: 349 10*3/uL (ref 150–400)
RBC: 4.97 MIL/uL (ref 3.87–5.11)
RDW: 13.2 % (ref 11.5–15.5)
WBC: 9.6 10*3/uL (ref 4.0–10.5)
nRBC: 0 % (ref 0.0–0.2)

## 2021-05-06 LAB — C DIFFICILE QUICK SCREEN W PCR REFLEX
C Diff antigen: NEGATIVE
C Diff interpretation: NOT DETECTED
C Diff toxin: NEGATIVE

## 2021-05-06 MED ORDER — IOHEXOL 300 MG/ML  SOLN
80.0000 mL | Freq: Once | INTRAMUSCULAR | Status: AC | PRN
  Administered 2021-05-06: 80 mL via INTRAVENOUS

## 2021-05-06 MED ORDER — LIDOCAINE 4 % EX CREA
TOPICAL_CREAM | Freq: Once | CUTANEOUS | Status: AC
  Filled 2021-05-06: qty 5

## 2021-05-06 MED ORDER — ONDANSETRON 4 MG PO TBDP: 4.0000 mg | ORAL_TABLET | Freq: Four times a day (QID) | ORAL | 0 refills | Status: DC | PRN

## 2021-05-06 MED ORDER — ONDANSETRON HCL 4 MG/2ML IJ SOLN
4.0000 mg | INTRAMUSCULAR | Status: AC
  Administered 2021-05-06: 4 mg via INTRAVENOUS
  Filled 2021-05-06: qty 2

## 2021-05-06 MED ORDER — GERHARDT'S BUTT CREAM
1.0000 "application " | TOPICAL_CREAM | Freq: Two times a day (BID) | CUTANEOUS | Status: DC | PRN
  Administered 2021-05-06: 1 via TOPICAL
  Filled 2021-05-06 (×2): qty 1

## 2021-05-06 MED ORDER — HYDROCODONE-ACETAMINOPHEN 5-325 MG PO TABS
1.0000 | ORAL_TABLET | Freq: Once | ORAL | Status: AC
  Administered 2021-05-06: 1 via ORAL
  Filled 2021-05-06: qty 1

## 2021-05-06 MED ORDER — SODIUM CHLORIDE 0.9 % IV BOLUS
1000.0000 mL | Freq: Once | INTRAVENOUS | Status: AC
  Administered 2021-05-06: 1000 mL via INTRAVENOUS

## 2021-05-06 NOTE — ED Provider Notes (Signed)
? ?[facility] ?Provider Note ? ? ? None  ?  (approximate) ? ? ?History  ? ?Diarrhea ? ? ?HPI ? ?Tracy Bryan is a 60 y.o. female who upon review of outside urgent care record from today PMH history of hypertension, diabetes, GERD and hypokalemia ? ?Throughout the course of the entire week has had several episodes of loose watery stool.  She reports it was the worst on Wednesday when she had about 20 episodes of watery stool.  Nonbloody generally loose clear to yellow in color.  Denies history of travel, no known bad water exposure, denies recent use of antibiotics.  Denies a history of C. Difficile (though she has an interesting allergy listed in the chart that mentions C. Difficile), but she denies any known history of C. difficile. ? ?Has some nausea but no vomiting.  She did feel somewhat fatigued not lightheaded, but does feel thirsty and somewhat dehydrated.  She is able to eat and drink but reports anything she eats or drinks passes right through her. ? ?Denies history of abdominal problems except for an extensive and major emergency surgery after delivery of her child years ago she had a emergency hysterectomy for ?  ?No fevers.  No bloody diarrhea ? ?Physical Exam  ? ?Triage Vital Signs: ?ED Triage Vitals  ?Enc Vitals Group  ?   BP 05/06/21 1007 (!) 154/79  ?   Pulse Rate 05/06/21 1007 63  ?   Resp 05/06/21 1007 18  ?   Temp 05/06/21 1007 97.7 ?F (36.5 ?C)  ?   Temp Source 05/06/21 1007 Oral  ?   SpO2 05/06/21 1007 100 %  ?   Weight 05/06/21 1006 110 lb (49.9 kg)  ?   Height 05/06/21 1006 5' (1.524 m)  ?   Head Circumference --   ?   Peak Flow --   ?   Pain Score 05/06/21 1005 6  ?   Pain Loc --   ?   Pain Edu? --   ?   Excl. in [place]? --   ? ? ?Most recent vital signs: ?Vitals:  ? 05/06/21 1030 05/06/21 1232  ?BP: 117/79 116/78  ?Pulse: 64 (!) 58  ?Resp: 17 17  ?Temp:    ?SpO2: 100% 99%  ? ? ? ?General: Awake, no distress.  ?Mucous membranes slightly dry.  She is fully alert  nontoxic well-appearing very pleasant ?CV:  Good peripheral perfusion.  Normal regular tones ?Resp:  Normal effort.  Clear lungs bilaterally ?Abd:  No distention.  Old midline surgical scars.  No noted hernias or masses.  She reports mild discomfort to palpation primarily in the left upper quadrant but no rebound or guarding.  No evidence of acute peritonitis ?Other:  No lower extremity edema ?Rectal: Escorted by nurse Corrie Dandy.  Patient has several mild small external hemorrhoids, reports they are mildly irritated.  No noted skin breakdown or erythema at this time.  Patient reports the frequent wiping she believes is irritated her hemorrhoids which have been present for years ? ?ED Results / Procedures / Treatments  ? ?Labs ?(all labs ordered are listed, but only abnormal results are displayed) ?Labs Reviewed  ?LIPASE, BLOOD - Abnormal; Notable for the following components:  ?    Result Value  ? Lipase 78 (*)   ? All other components within normal limits  ?COMPREHENSIVE METABOLIC PANEL - Abnormal; Notable for the following components:  ? CO2 17 (*)   ? Glucose, Bld 121 (*)   ?  BUN 38 (*)   ? Creatinine, Ser 1.20 (*)   ? Total Protein 8.4 (*)   ? GFR, Estimated 51 (*)   ? All other components within normal limits  ?C DIFFICILE QUICK SCREEN W PCR REFLEX    ?GASTROINTESTINAL PANEL BY PCR, STOOL (REPLACES STOOL CULTURE)  ?CBC  ?URINALYSIS, ROUTINE W REFLEX MICROSCOPIC  ? ? ? ?EKG ? ? ? ? ?RADIOLOGY ? ?CT abdomen pelvis ?IMPRESSION:  ?1. No acute CT findings of the abdomen or pelvis to explain  ?diarrhea.  ?2. Hepatic steatosis.  ? ?PROCEDURES: ? ?Critical Care performed: No ? ?Procedures ? ? ?MEDICATIONS ORDERED IN ED: ?Medications  ?Gerhardt's butt cream 1 application. (1 application. Topical Given 05/06/21 1249)  ?sodium chloride 0.9 % bolus 1,000 mL (1,000 mLs Intravenous New Bag/Given 05/06/21 1027)  ?ondansetron (ZOFRAN) injection 4 mg (4 mg Intravenous Given 05/06/21 1026)  ?HYDROcodone-acetaminophen (NORCO/VICODIN)  5-325 MG per tablet 1 tablet (1 tablet Oral Given 05/06/21 1028)  ?lidocaine (LMX) 4 % cream ( Topical Given 05/06/21 1248)  ?iohexol (OMNIPAQUE) 300 MG/ML solution 80 mL (80 mLs Intravenous Contrast Given 05/06/21 1256)  ? ? ? ?IMPRESSION / MDM / ASSESSMENT AND PLAN / ED COURSE  ?I reviewed the triage vital signs and the nursing notes. ?             ?               ? ?Differential diagnosis includes but is not limited to, abdominal perforation, aortic dissection, cholecystitis, appendicitis, diverticulitis, colitis, esophagitis/gastritis, kidney stone, pyelonephritis, urinary tract infection, aortic aneurysm. All are considered in decision and treatment plan. Based upon the patient's presentation and risk factors, we will proceed by obtaining stool cultures, CT imaging to evaluate for possible infectious causes of what appear to be symptoms of colitis.  Discussed pain control with her, will trial hydrocodone, reports she has used oxycodone with good effect in the past but does cause slight itching. ? ?We will also start IV fluids hydrate.  No cardiopulmonary symptoms.  No neurologic or vascular symptoms ? ? ?The patient is on the cardiac monitor to evaluate for evidence of arrhythmia and/or significant heart rate changes. ? ? ?Clinical Course as of 05/06/21 1450  ?Sat May 06, 2021  ?1340 Personally reviewed and interpreted the patient's CT abdomen pelvis for gross pathology, I do not see evidence of acute gross pathology. [MQ]  ?  ?Clinical Course User Index ?[MQ] Sharyn CreamerQuale, Urvi Imes, MD  ? ?----------------------------------------- ?2:50 PM on 05/06/2021 ?----------------------------------------- ?C. difficile testing negative.  Remainder of stool panel is pending.  She does not have leukocytosis or fever or bloody diarrhea, and I suspect this is likely infectious possibly self-limited diarrheal illness.  Await remaining stool panel which will be followed up after ED visit. ? ?Discussed with the patient and her husband,  comfortable with plan for discharge.  Nausea controlled has had 1 stool here.  She is in no acute distress, she has been rehydrated, and her labs does show mild dehydration. ? ?She reports she will be able to follow-up with her primary care provider and I also made and recommended she follow-up with gastroenterology, I have placed a urgent referral request. ? ?We discussed maintaining good hydration, careful return precautions, will prescribe Zofran, and also suggested she may trial over-the-counter Imodium which we did talk about some of the common side effects of as well.  Patient and husband agreeable with plan ? ?Return precautions and treatment recommendations and follow-up discussed with the patient who  is agreeable with the plan. ? ?Patient fully alert nontoxic normal vital signs well-appearing at time of disposition ? ?Vitals:  ? 05/06/21 1030 05/06/21 1232  ?BP: 117/79 116/78  ?Pulse: 64 (!) 58  ?Resp: 17 17  ?Temp:    ?SpO2: 100% 99%  ? ?Stool study negative for C. difficile. ? ?FINAL CLINICAL IMPRESSION(S) / ED DIAGNOSES  ? ?Final diagnoses:  ?Diarrhea of presumed infectious origin  ?Dehydration  ? ? ? ?Rx / DC Orders  ? ?ED Discharge Orders   ? ?      Ordered  ?  Ambulatory referral to Gastroenterology       ? 05/06/21 1450  ?  ondansetron (ZOFRAN-ODT) 4 MG disintegrating tablet  Every 6 hours PRN       ? 05/06/21 1450  ? ?  ?  ? ?  ? ? ? ?Note:  This document was prepared using Dragon voice recognition software and may include unintentional dictation errors. ?  Sharyn Creamer, MD ?05/06/21 1452 ? ?

## 2021-05-06 NOTE — ED Triage Notes (Signed)
Pt comes pov with a week of lower abd pain and diarrhea. States brown/yellow and watery. Nausea but no emesis. No fevers.  ?

## 2021-05-06 NOTE — ED Provider Notes (Signed)
negative stool panel ?  ?Delman Kitten, MD ?05/06/21 1545 ? ?

## 2021-05-06 NOTE — Discharge Instructions (Signed)
Please go to emergency department at [facility] for further care and management. ?

## 2021-05-06 NOTE — ED Notes (Signed)
Patient is being discharged from the Urgent Care and sent to the [facility] Emergency Department via private vehicle . Per Maude Leriche, PA, patient is in need of higher level of care due to needing possible CT scan. Patient is aware and verbalizes understanding of plan of care.  ?Vitals:  ? 05/06/21 0932  ?BP: 106/88  ?Pulse: 62  ?Resp: 14  ?Temp: 98.2 ?F (36.8 ?C)  ?SpO2: 100%  ?  ?

## 2021-05-06 NOTE — ED Provider Notes (Signed)
? ? ?Provider Note ? ?Patient Contact: 9:42 AM (approximate) ? ? ?History  ? ?Diarrhea and Abdominal Pain ? ? ?HPI ? ?Tracy Bryan is a 61 y.o. female with a history of hypertension, diabetes, GERD and hypokalemia, presents to the emergency department with 3 days of nausea and diarrhea.  Patient reports that she had approximately 10 episodes of diarrhea last night.  Patient states that she has been unable to sleep at night due to the diarrhea keeping her up.  No recent antibiotic usage.  No prior history of C. difficile.  She states that she has mid abdominal pain.  Denies radiation of pain.  She denies associated fever.  States that she is never experienced symptoms quite like this in the past.  She denies chest pain, chest tightness or shortness of breath. ? ?  ? ? ?Physical Exam  ? ?Triage Vital Signs: ?ED Triage Vitals  ?Enc Vitals Group  ?   BP 05/06/21 0932 106/88  ?   Pulse Rate 05/06/21 0932 62  ?   Resp 05/06/21 0932 14  ?   Temp 05/06/21 0932 98.2 ?F (36.8 ?C)  ?   Temp Source 05/06/21 0932 Oral  ?   SpO2 05/06/21 0932 100 %  ?   Weight 05/06/21 0927 110 lb (49.9 kg)  ?   Height 05/06/21 0927 5\' 1"  (1.549 m)  ?   Head Circumference --   ?   Peak Flow --   ?   Pain Score 05/06/21 0927 6  ?   Pain Loc --   ?   Pain Edu? --   ?   Excl. in [place]? --   ? ? ?Most recent vital signs: ?Vitals:  ? 05/06/21 0932  ?BP: 106/88  ?Pulse: 62  ?Resp: 14  ?Temp: 98.2 ?F (36.8 ?C)  ?SpO2: 100%  ? ? ? ?General: Alert and in no acute distress. ?Eyes:  PERRL. EOMI. ?Head: No acute traumatic findings ?ENT: ?     Ears:  ?     Nose: No congestion/rhinnorhea. ?     Mouth/Throat: Mucous membranes are moist. ?Neck: No stridor. No cervical spine tenderness to palpation. ?Cardiovascular:  Good peripheral perfusion ?Respiratory: Normal respiratory effort without tachypnea or retractions. Lungs CTAB. Good air entry to the bases with no decreased or absent breath sounds. ?Gastrointestinal: Bowel sounds ?4 quadrants.  Patient has  left upper, epigastric and periumbilical guarding.  No palpable masses. No distention. No CVA tenderness. ?Musculoskeletal: Full range of motion to all extremities.  ?Neurologic:  No gross focal neurologic deficits are appreciated.  ?Skin:   No rash noted ?Other: ? ? ?ED Results / Procedures / Treatments  ? ?Labs ?(all labs ordered are listed, but only abnormal results are displayed) ?Labs Reviewed - No data to display ? ? ? ? ? ?PROCEDURES: ? ?Critical Care performed: No ? ?Procedures ? ? ?MEDICATIONS ORDERED IN ED: ?Medications - No data to display ? ? ?IMPRESSION / MDM / ASSESSMENT AND PLAN / ED COURSE  ?I reviewed the triage vital signs and the nursing notes. ?             ?               ? ?Differential diagnosis includes, but is not limited to, small bowel obstruction, C. difficile, diverticulitis, electrolyte abnormality... ? ?61 year old female presents to the urgent care with inability to maintain p.o. intake and 3 days of diarrhea that kept her up at night. ? ?Vital signs are reassuring  at triage.  On physical exam, patient seems subdued and mucous membranes are dry.  She had epigastric, left upper quadrant and periumbilical tenderness with guarding. ? ?I offered to conduct labs but feel that patient needs a CT abdomen pelvis given physical exam findings and we do not have access to CT imaging in the urgent care setting today.  Patient states that she would feel more comfortable going to the emergency department for comprehensive work-up instead of starting labs in the urgent care.  Patient's husband is available to drive patient to the emergency department.  All patient questions were answered. ? ? ?FINAL CLINICAL IMPRESSION(S) / ED DIAGNOSES  ? ?Final diagnoses:  ?Pain of upper abdomen  ? ? ? ?Rx / DC Orders  ? ?ED Discharge Orders   ? ? None  ? ?  ? ? ? ?Note:  This document was prepared using Dragon voice recognition software and may include unintentional dictation errors. ?  ?Lannie Fields,  PA-C ?05/06/21 [id number] ? ?

## 2021-05-06 NOTE — ED Triage Notes (Signed)
Patient c/o diarrhea and mid abdominal pain that started 3 days ago. Patient reports a lot of gurgling and burping.  Patient denies any cold symptoms.  Patient denies fevers.  ?

## 2021-05-08 ENCOUNTER — Ambulatory Visit: Payer: Managed Care, Other (non HMO) | Admitting: Internal Medicine

## 2021-05-08 ENCOUNTER — Encounter: Payer: Self-pay | Admitting: Internal Medicine

## 2021-05-08 ENCOUNTER — Other Ambulatory Visit: Payer: Self-pay

## 2021-05-08 VITALS — BP 122/62 | HR 76 | Ht 60.0 in | Wt 110.0 lb

## 2021-05-08 DIAGNOSIS — R197 Diarrhea, unspecified: Secondary | ICD-10-CM | POA: Diagnosis not present

## 2021-05-08 DIAGNOSIS — E118 Type 2 diabetes mellitus with unspecified complications: Secondary | ICD-10-CM

## 2021-05-08 MED ORDER — DIPHENOXYLATE-ATROPINE 2.5-0.025 MG PO TABS: 1.0000 | ORAL_TABLET | Freq: Four times a day (QID) | ORAL | 0 refills | Status: DC | PRN

## 2021-05-08 NOTE — Patient Instructions (Signed)
Hold Vascepa and Metformin until diarrhea resolves, then gradually re-start  medications.  First metformin then vascepa. ?

## 2021-05-08 NOTE — Progress Notes (Signed)
? ? ?Date:  05/08/2021  ? ?Name:  Tracy Bryan   DOB:  November 03, 1960   MRN:  [id number] ? ? ?Chief Complaint: Diarrhea ? ?Diarrhea  ?This is a new problem. The current episode started in the past 7 days. The problem occurs more than 10 times per day. The problem has been unchanged. The stool consistency is described as Watery. The patient states that diarrhea awakens her from sleep. Associated symptoms include abdominal pain, headaches, myalgias and weight loss. Pertinent negatives include no fever. She has tried anti-motility drug for the symptoms.  ? ?Lab Results  ?Component Value Date  ? NA 135 05/06/2021  ? K 5.1 05/06/2021  ? CO2 17 (L) 05/06/2021  ? GLUCOSE 121 (H) 05/06/2021  ? BUN 38 (H) 05/06/2021  ? CREATININE 1.20 (H) 05/06/2021  ? CALCIUM 10.1 05/06/2021  ? EGFR 75 04/03/2021  ? GFRNONAA 51 (L) 05/06/2021  ? ?Lab Results  ?Component Value Date  ? CHOL 156 04/03/2021  ? HDL 49 04/03/2021  ? [place] 64 04/03/2021  ? TRIG 270 (H) 04/03/2021  ? CHOLHDL 3.2 04/03/2021  ? ?Lab Results  ?Component Value Date  ? TSH 1.430 07/22/2020  ? ?Lab Results  ?Component Value Date  ? HGBA1C 6.1 (H) 04/03/2021  ? ?Lab Results  ?Component Value Date  ? WBC 9.6 05/06/2021  ? HGB 14.4 05/06/2021  ? HCT 43.6 05/06/2021  ? MCV 87.7 05/06/2021  ? PLT 349 05/06/2021  ? ?Lab Results  ?Component Value Date  ? ALT 27 05/06/2021  ? AST 26 05/06/2021  ? ALKPHOS 54 05/06/2021  ? BILITOT 0.9 05/06/2021  ? ?Lab Results  ?Component Value Date  ? VD25OH 52.8 05/28/2017  ?  ? ?Review of Systems  ?Constitutional:  Positive for weight loss. Negative for fever.  ?Gastrointestinal:  Positive for abdominal pain and diarrhea.  ?Musculoskeletal:  Positive for myalgias.  ?Neurological:  Positive for headaches.  ? ?Patient Active Problem List  ? Diagnosis Date Noted  ? 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 Bilateral carpal tunnel syndrome 11/07/2016  ? Herpes simplex infection  11/23/2015  ? Fibromyalgia 03/11/2015  ? Menopause syndrome 02/17/2015  ? Esophageal reflux 02/17/2015  ? Vitamin D deficiency 02/17/2015  ? Hyperlipidemia associated with type 2 diabetes mellitus ([place]) 07/28/2014  ? Essential hypertension 07/28/2014  ? Type II diabetes mellitus with complication ([place]) 54/00/8676  ? ? ?Allergies  ?Allergen Reactions  ? Rabeprazole Sodium   ?  C Diff Side Affects  ? Augmentin [Amoxicillin-Pot Clavulanate] Itching  ? Benazepril Hcl Cough  ? Penicillins Other (See Comments)  ? Jardiance [Empagliflozin] Rash  ?  Recurrent yeast vaginitis  ? ? ?Past Surgical History:  ?Procedure Laterality Date  ? ABDOMINAL HYSTERECTOMY    ? CERVICAL DISC SURGERY    ? COLONOSCOPY  07/22/2013  ? [place] OF UTERUS  1996  ? HERNIA REPAIR  2000  ? [place]  ? HIP ARTHROPLASTY Right   ? TOTAL HIP ARTHROPLASTY Left 05/18/2014  ? Procedure: LEFT TOTAL HIP ARTHROPLASTY ANTERIOR APPROACH;  Surgeon: Melrose Nakayama, MD;  Location: [facility];  Service: Orthopedics;  Laterality: Left;  ? ? ?Social History  ? ?Tobacco Use  ? Smoking status: Former  ?  Types: Cigarettes  ?  Quit date: 06/14/1984  ?  Years since quitting: 36.9  ? Smokeless tobacco: Never  ?Vaping Use  ? Vaping Use: Never used  ?Substance Use Topics  ? Alcohol use:  No  ?  Alcohol/week: 0.0 standard drinks  ? Drug use: No  ? ? ? ?Medication list has been reviewed and updated. ? ?Current Meds  ?Medication Sig  ? amLODipine (NORVASC) 5 MG tablet TAKE 1 TABLET (5 MG TOTAL) BY MOUTH DAILY.  ? aspirin 81 MG tablet Take 81 mg by mouth daily.  ? atorvastatin (LIPITOR) 40 MG tablet TAKE 1 TABLET BY MOUTH EVERY DAY  ? busPIRone (BUSPAR) 10 MG tablet Take 1 tablet (10 mg total) by mouth 3 (three) times daily.  ? Cholecalciferol (VITAMIN D) 2000 units CAPS Take 1 capsule (2,000 Units total) by mouth daily.  ? DULoxetine (CYMBALTA) 60 MG capsule TAKE 1 CAPSULE BY MOUTH EVERY DAY  ? glucose blood test strip 1 each by Other route daily. Use as instructed  ?  Icosapent Ethyl (VASCEPA) 0.5 g CAPS Take 2 capsules by mouth 2 (two) times daily.  ? lansoprazole (PREVACID) 15 MG capsule Take 15 mg by mouth daily at 12 noon.  ? losartan (COZAAR) 100 MG tablet Take 1 tablet (100 mg total) by mouth daily.  ? metFORMIN (GLUCOPHAGE-XR) 500 MG 24 hr tablet TAKE 1 TABLET BY MOUTH TWICE A DAY  ? metoprolol succinate (TOPROL-XL) 100 MG 24 hr tablet Take 0.5 tablets (50 mg total) by mouth in the morning and at bedtime.  ? Multiple Vitamins-Minerals (MULTIVITAMIN WITH MINERALS) tablet Take 1 tablet by mouth daily.  ? ondansetron (ZOFRAN-ODT) 4 MG disintegrating tablet Take 1 tablet (4 mg total) by mouth every 6 (six) hours as needed for nausea or vomiting.  ? TRULICITY 0.27 OZ/3.6UY SOPN INJECT 0.75 MG INTO THE SKIN ONCE A WEEK.  ? valACYclovir (VALTREX) 1000 MG tablet TAKE 1 TABLET BY MOUTH TWICE A DAY  ? ? ? ?  04/03/2021  ? 10:20 AM 11/30/2020  ?  9:04 AM 07/22/2020  ?  8:38 AM 05/10/2020  ?  2:29 PM  ?PHQ 2/9 Scores  ?PHQ - 2 Score 1 4 0 0  ?PHQ- 9 Score _0 0  ? ? ? ?  04/03/2021  ? 10:21 AM 11/30/2020  ?  9:04 AM 07/22/2020  ?  8:38 AM 05/10/2020  ?  2:29 PM  ?GAD 7 : Generalized Anxiety Score  ?Nervous, Anxious, on Edge _1 0  ?Control/stop worrying _2 0  ?Worry too much - different things _3 0  ?Trouble relaxing _4 0  ?Restless _5 0  ?Easily annoyed or irritable _6 0  ?Afraid - awful might happen 0 2 1 0  ?Total GAD 7 Score _7 0  ?Anxiety Difficulty  Somewhat difficult  Not difficult at all  ? ? ?BP Readings from Last 3 Encounters:  ?05/08/21 122/62  ?05/06/21 117/71  ?05/06/21 106/88  ? ? ?Physical Exam ?Constitutional:   ?   Appearance: Normal appearance.  ?Cardiovascular:  ?   Rate and Rhythm: Normal rate and regular rhythm.  ?Pulmonary:  ?   Effort: Pulmonary effort is normal.  ?   Breath sounds: Normal breath sounds.  ?Abdominal:  ?   General: Abdomen is flat. There is no distension.  ?   Palpations: Abdomen is soft. There is no mass.  ?   Tenderness:  There is abdominal tenderness. There is no guarding or rebound.  ?   Hernia: No hernia is present.  ?Musculoskeletal:  ?   Cervical back: Normal range of motion.  ?   Right lower leg: No  edema.  ?   Left lower leg: No edema.  ?Neurological:  ?   Mental Status: She is alert.  ? ? ?Wt Readings from Last 3 Encounters:  ?05/08/21 110 lb (49.9 kg)  ?05/06/21 110 lb (49.9 kg)  ?05/06/21 110 lb (49.9 kg)  ? ? ?BP 122/62   Pulse 76   Ht 5' (1.524 m)   Wt 110 lb (49.9 kg)   LMP  (LMP Unknown)   SpO2 97%   BMI 21.48 kg/m?  ? ?Assessment and Plan: ?1. Diarrhea, unspecified type ?Hold metformin and Vascepa. ?Use Lomotil qid as needed ?Push fluids - Gatorade or Powerade. ?- diphenoxylate-atropine (LOMOTIL) 2.5-0.025 MG tablet; Take 1 tablet by mouth 4 (four) times daily as needed for diarrhea or loose stools.  Dispense: 30 tablet; Refill: 0 ? ?2. Type II diabetes mellitus with complication (HCC) ?Continue other medications except metformin ? ? ?Partially dictated using Editor, commissioning. Any errors are unintentional. ? ?Halina Maidens, MD ?[facility] ?[facility] ? ?05/08/2021 ? ? ? ? ? ?

## 2021-05-11 ENCOUNTER — Ambulatory Visit: Payer: Managed Care, Other (non HMO) | Admitting: Gastroenterology

## 2021-05-11 ENCOUNTER — Other Ambulatory Visit: Payer: Self-pay

## 2021-05-11 ENCOUNTER — Encounter: Payer: Self-pay | Admitting: Gastroenterology

## 2021-05-11 VITALS — BP 94/56 | HR 82 | Temp 97.6°F | Ht 63.0 in | Wt 111.0 lb

## 2021-05-11 DIAGNOSIS — R197 Diarrhea, unspecified: Secondary | ICD-10-CM

## 2021-05-11 MED ORDER — NA SULFATE-K SULFATE-MG SULF 17.5-3.13-1.6 GM/177ML PO SOLN: 354.0000 mL | Freq: Once | ORAL | 0 refills | Status: AC

## 2021-05-11 NOTE — Progress Notes (Signed)
?  ?Wyline Mood MD, MRCP(U.K) ?[address]  ?[address]  ?[place], [place] [address]  ?Main: [phone]  ?Fax: [phone] ? ? ?Gastroenterology Consultation ? ?Referring Provider:     Reubin Milan, MD ?Primary Care Physician:  Reubin Milan, MD ?Primary Gastroenterologist:  Dr. Wyline Mood  ?Reason for Consultation:    Diarrhea ?      ? HPI:   ?Tracy Bryan is a 61 y.o. y/o female referred for consultation & management  by Dr. Judithann Graves, Nyoka Cowden, MD.   ? ? ?She has been referred to see me for diarrhea as an urgent visit.  Ongoing for the past 10 days occurring 10 times per day ? ?05/06/2021 C. difficile panel was negative, GI PCR panel showed no abnormalities.,  Hemoglobin 14.5 g.  CMP showed a creatinine of 1.2.  She is on metformin.  Last colonoscopy in 2015 that was reported normal. ? ?05/06/2021 she went to the emergency room for the same reason underwent a CT scan of the abdomen which was negative. ? ?She denies any recent antibiotic use denies any NSAID use consumes about 2 packets of Stevia daily with a coffee.  No recent colonoscopy no family history of colon cancer no blood in the stool.  No abdominal pain has multiple bowel movements a day at this point of time.  She had been on metformin which has been stopped. ? ?Past Medical History:  ?Diagnosis Date  ? Arthritis   ? Diabetes mellitus without complication (HCC)   ? Type 2  ? Disseminated intravascular coagulation (HCC) 1999  ? After child birth  ? GERD (gastroesophageal reflux disease)   ? Herpes simplex   ? Hypercholesteremia   ? Hypertension   ? Migraines   ? Pneumonia   ? PONV (postoperative nausea and vomiting)   ? ? ?Past Surgical History:  ?Procedure Laterality Date  ? ABDOMINAL HYSTERECTOMY    ? CERVICAL DISC SURGERY    ? COLONOSCOPY  07/22/2013  ? DILATION AND CURETTAGE OF UTERUS  1996  ? HERNIA REPAIR  2000  ? Umblical  ? HIP ARTHROPLASTY Right   ? TOTAL HIP ARTHROPLASTY Left 05/18/2014  ? Procedure: LEFT TOTAL HIP  ARTHROPLASTY ANTERIOR APPROACH;  Surgeon: Marcene Corning, MD;  Location: [facility];  Service: Orthopedics;  Laterality: Left;  ? ? ?Prior to Admission medications   ?Medication Sig Start Date End Date Taking? Authorizing Provider  ?amLODipine (NORVASC) 5 MG tablet TAKE 1 TABLET (5 MG TOTAL) BY MOUTH DAILY. 01/05/21   Reubin Milan, MD  ?aspirin 81 MG tablet Take 81 mg by mouth daily.    [provider]  ?atorvastatin (LIPITOR) 40 MG tablet TAKE 1 TABLET BY MOUTH EVERY DAY 09/16/20   Reubin Milan, MD  ?busPIRone (BUSPAR) 10 MG tablet Take 1 tablet (10 mg total) by mouth 3 (three) times daily. 03/07/21   Reubin Milan, MD  ?Cholecalciferol (VITAMIN D) 2000 units CAPS Take 1 capsule (2,000 Units total) by mouth daily. 02/17/15   Plonk, Chrissie Noa, MD  ?diphenoxylate-atropine (LOMOTIL) 2.5-0.025 MG tablet Take 1 tablet by mouth 4 (four) times daily as needed for diarrhea or loose stools. 05/08/21   Reubin Milan, MD  ?DULoxetine (CYMBALTA) 60 MG capsule TAKE 1 CAPSULE BY MOUTH EVERY DAY 07/04/20   Reubin Milan, MD  ?glucose blood test strip 1 each by Other route daily. Use as instructed    [provider]  ?Icosapent Ethyl (VASCEPA) 0.5 g CAPS Take 2 capsules by  mouth 2 (two) times daily. 04/03/21   Reubin MilanBerglund, Laura H, MD  ?lansoprazole (PREVACID) 15 MG capsule Take 15 mg by mouth daily at 12 noon.    [provider]  ?losartan (COZAAR) 100 MG tablet Take 1 tablet (100 mg total) by mouth daily. 03/07/21   Reubin MilanBerglund, Laura H, MD  ?metFORMIN (GLUCOPHAGE-XR) 500 MG 24 hr tablet TAKE 1 TABLET BY MOUTH TWICE A DAY 02/01/21   Reubin MilanBerglund, Laura H, MD  ?metoprolol succinate (TOPROL-XL) 100 MG 24 hr tablet Take 0.5 tablets (50 mg total) by mouth in the morning and at bedtime. 03/07/21   Reubin MilanBerglund, Laura H, MD  ?Multiple Vitamins-Minerals (MULTIVITAMIN WITH MINERALS) tablet Take 1 tablet by mouth daily.    [provider]  ?ondansetron (ZOFRAN-ODT) 4 MG disintegrating tablet Take 1 tablet (4 mg  total) by mouth every 6 (six) hours as needed for nausea or vomiting. 05/06/21   Sharyn CreamerQuale, Mark, MD  ?TRULICITY 0.75 MG/0.5ML SOPN INJECT 0.75 MG INTO THE SKIN ONCE A WEEK. 12/18/20   Reubin MilanBerglund, Laura H, MD  ?valACYclovir (VALTREX) 1000 MG tablet TAKE 1 TABLET BY MOUTH TWICE A DAY 03/22/21   Reubin MilanBerglund, Laura H, MD  ? ? ?Family History  ?Problem Relation Age of Onset  ? Hypertension Mother   ? Stroke Mother   ? Hyperlipidemia Mother   ? Heart disease Mother   ?     heart attack  ? Diabetes Father   ? Cancer Father   ?     lung  ? Diabetes Sister   ? Hyperlipidemia Sister   ? Stroke Paternal Grandfather   ? Hypertension Paternal Grandfather   ? Stroke Maternal Grandfather   ? Alzheimer's disease Paternal Grandmother   ? Diabetes Sister   ? Cancer Sister   ?     breast  ? Breast cancer Sister [id number]  ? Breast cancer Paternal Aunt   ?  ? ?Social History  ? ?Tobacco Use  ? Smoking status: Former  ?  Types: Cigarettes  ?  Quit date: 06/14/1984  ?  Years since quitting: 36.9  ? Smokeless tobacco: Never  ?Vaping Use  ? Vaping Use: Never used  ?Substance Use Topics  ? Alcohol use: No  ?  Alcohol/week: 0.0 standard drinks  ? Drug use: No  ? ? ?Allergies as of 05/11/2021 - Review Complete 05/11/2021  ?Allergen Reaction Noted  ? Rabeprazole sodium  02/15/2017  ? Augmentin [amoxicillin-pot clavulanate] Itching 12/24/2014  ? Benazepril hcl Cough 07/22/2014  ? Penicillins Other (See Comments) 12/24/2014  ? Jardiance [empagliflozin] Rash 09/18/2016  ? ? ?Review of Systems:    ?All systems reviewed and negative except where noted in HPI. ? ? Physical Exam:  ?BP (!) 94/56   Pulse 82   Temp 97.6 ?F (36.4 ?C) (Oral)   Ht 5\' 3"  (1.6 m)   Wt 111 lb (50.3 kg)   LMP  (LMP Unknown)   BMI 19.66 kg/m?  ?No LMP recorded (lmp unknown). Patient has had a hysterectomy. ?Psych:  Alert and cooperative. Normal mood and affect. ?General:   Alert,  Well-developed, well-nourished, pleasant and cooperative in NAD ?Head:  Normocephalic and atraumatic. ?Eyes:   Sclera clear, no icterus.   Conjunctiva pink. ?Abdomen:  Normal bowel sounds.  No bruits.  Soft, non-tender and non-distended without masses, hepatosplenomegaly or hernias noted.  No guarding or rebound tenderness.    ?Neurologic:  Alert and oriented x3;  grossly normal neurologically. ?Psych:  Alert and cooperative. Normal mood and affect. ? ?Imaging Studies: ?  CT ABDOMEN PELVIS W CONTRAST ? ?Result Date: 05/06/2021 ?CLINICAL DATA:  Diarrhea EXAM: CT ABDOMEN AND PELVIS WITH CONTRAST TECHNIQUE: Multidetector CT imaging of the abdomen and pelvis was performed using the standard protocol following bolus administration of intravenous contrast. RADIATION DOSE REDUCTION: This exam was performed according to the departmental dose-optimization program which includes automated exposure control, adjustment of the mA and/or kV according to patient size and/or use of iterative reconstruction technique. CONTRAST:  55mL OMNIPAQUE IOHEXOL 300 MG/ML SOLN additional oral enteric contrast COMPARISON:  04/03/2006 FINDINGS: Lower chest: No acute abnormality. Hepatobiliary: No solid liver abnormality is seen. Hepatic steatosis. No gallstones, gallbladder wall thickening, or biliary dilatation. Pancreas: Unremarkable. No pancreatic ductal dilatation or surrounding inflammatory changes. Spleen: Normal in size without significant abnormality. Adrenals/Urinary Tract: Adrenal glands are unremarkable. Kidneys are normal, without renal calculi, solid lesion, or hydronephrosis. Assessment of the bladder is very limited by dense metallic streak artifact from adjacent bilateral hip arthroplasty. Stomach/Bowel: Stomach is within normal limits. Appendix appears normal. No evidence of bowel wall thickening, distention, or inflammatory changes. Vascular/Lymphatic: No significant vascular findings are present. No enlarged abdominal or pelvic lymph nodes. Reproductive: Status post hysterectomy. Other: No abdominal wall hernia or abnormality. No ascites.  Pelvic floor insufficiency with rectocele (series 6, image 58). Musculoskeletal: No acute or significant osseous findings. Status post bilateral hip total arthroplasty. IMPRESSION: 1. No acute CT findings of the abdome

## 2021-05-15 ENCOUNTER — Telehealth: Payer: Self-pay | Admitting: Gastroenterology

## 2021-05-15 NOTE — Telephone Encounter (Addendum)
Called patient back to let her know that I cancelled her procedure per her request. Patient stated that she feels better. The endoscopy unit was contacted and procedure was cancelled. ?

## 2021-05-15 NOTE — Telephone Encounter (Signed)
Patient states that DR said if she starts feelings better to cancel her colonoscopy. So that is what she would like to do.  ?

## 2021-05-19 ENCOUNTER — Encounter: Admission: RE | Payer: Self-pay | Source: Home / Self Care

## 2021-05-19 ENCOUNTER — Ambulatory Visit
Admission: RE | Admit: 2021-05-19 | Payer: Managed Care, Other (non HMO) | Source: Home / Self Care | Admitting: Gastroenterology

## 2021-05-19 SURGERY — COLONOSCOPY WITH PROPOFOL
Anesthesia: General

## 2021-05-20 ENCOUNTER — Encounter: Payer: Self-pay | Admitting: Emergency Medicine

## 2021-05-20 ENCOUNTER — Ambulatory Visit
Admission: EM | Admit: 2021-05-20 | Discharge: 2021-05-20 | Disposition: A | Discharge status: Home / Self Care | Payer: Managed Care, Other (non HMO) | Attending: Student | Admitting: Student

## 2021-05-20 ENCOUNTER — Other Ambulatory Visit: Payer: Self-pay

## 2021-05-20 DIAGNOSIS — H6121 Impacted cerumen, right ear: Secondary | ICD-10-CM | POA: Diagnosis not present

## 2021-05-20 DIAGNOSIS — Z880 Allergy status to penicillin: Secondary | ICD-10-CM

## 2021-05-20 DIAGNOSIS — J0100 Acute maxillary sinusitis, unspecified: Secondary | ICD-10-CM | POA: Diagnosis not present

## 2021-05-20 MED ORDER — CEFDINIR 300 MG PO CAPS: 300.0000 mg | ORAL_CAPSULE | Freq: Two times a day (BID) | ORAL | 0 refills | Status: AC

## 2021-05-20 NOTE — Discharge Instructions (Addendum)
-  Omnicef (cefdinir) twice daily x7 days for sinusitis and possible R ear infection ?-Continue benedryl   ?-Most people who are mildly allergic to penicillin do great on Omnicef, but stop medication and call us or seek immediate medical attention if any side effects - rash, itching, sensation of throat closing, etc.  ?

## 2021-05-20 NOTE — ED Triage Notes (Signed)
Patient c/o sinus congestion and pressure, headache and right ear pain that started a week ago.  Patient denies fevers.  ?

## 2021-05-20 NOTE — ED Provider Notes (Signed)
?[facility] ? ? ? ?CSN: [id number] ?Arrival date & time: 05/20/21  0910 ? ? ?  ? ?History   ?Chief Complaint ?Chief Complaint  ?Patient presents with  ? Sinus Problem  ? Otalgia  ?  right  ? ? ?HPI ?Tracy Bryan is a 61 y.o. female presenting with progressively worsening sinus congestion, pressure behind nose, right ear pain and decreased hearing.  Symptoms for about 1 week.  Patient states her allergies have been acting up this year, she is taking Benadryl for relief.  Nonproductive cough.  Denies shortness of breath, chest pain, fever/chills, dizziness, weakness, nausea/vomiting/diarrhea, tinnitus. ? ?HPI ? ?Past Medical History:  ?Diagnosis Date  ? Arthritis   ? Diabetes mellitus without complication (HCC)   ? Type 2  ? Disseminated intravascular coagulation (HCC) 1999  ? After child birth  ? GERD (gastroesophageal reflux disease)   ? Herpes simplex   ? Hypercholesteremia   ? Hypertension   ? Migraines   ? Pneumonia   ? PONV (postoperative nausea and vomiting)   ? ? ?Patient Active Problem List  ? Diagnosis Date Noted  ? 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 Status post total hysterectomy 03/23/2020  ? 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 Bilateral carpal tunnel syndrome 11/07/2016  ? Herpes simplex infection 11/23/2015  ? Fibromyalgia 03/11/2015  ? Menopause syndrome 02/17/2015  ? Esophageal reflux 02/17/2015  ? Vitamin D deficiency 02/17/2015  ? Hyperlipidemia associated with type 2 diabetes mellitus (HCC) 07/28/2014  ? Essential hypertension 07/28/2014  ? Type II diabetes mellitus with complication (HCC) 05/18/2014  ? ? ?Past Surgical History:  ?Procedure Laterality Date  ? CERVICAL DISC SURGERY    ? COLONOSCOPY  07/22/2013  ? DILATION AND CURETTAGE OF UTERUS  1996  ? HERNIA REPAIR  2000  ? Umblical  ? HIP ARTHROPLASTY Right   ? TOTAL ABDOMINAL HYSTERECTOMY    ? TOTAL HIP ARTHROPLASTY Left 05/18/2014  ? Procedure: LEFT TOTAL HIP ARTHROPLASTY ANTERIOR APPROACH;  Surgeon: Marcene Corning, MD;  Location:  [facility] OR;  Service: Orthopedics;  Laterality: Left;  ? ? ?OB History   ?No obstetric history on file. ?  ? ? ? ?Home Medications   ? ?Prior to Admission medications   ?Medication Sig Start Date End Date Taking? Authorizing Provider  ?amLODipine (NORVASC) 5 MG tablet TAKE 1 TABLET (5 MG TOTAL) BY MOUTH DAILY. 01/05/21  Yes Reubin Milan, MD  ?aspirin 81 MG tablet Take 81 mg by mouth daily.   Yes [provider]  ?atorvastatin (LIPITOR) 40 MG tablet TAKE 1 TABLET BY MOUTH EVERY DAY 09/16/20  Yes Reubin Milan, MD  ?cefdinir (OMNICEF) 300 MG capsule Take 1 capsule (300 mg total) by mouth 2 (two) times daily for 7 days. 05/20/21 05/27/21 Yes Rhys Martini, PA-C  ?Cholecalciferol (VITAMIN D) 2000 units CAPS Take 1 capsule (2,000 Units total) by mouth daily. 02/17/15  Yes Plonk, Chrissie Noa, MD  ?DULoxetine (CYMBALTA) 60 MG capsule TAKE 1 CAPSULE BY MOUTH EVERY DAY 07/04/20  Yes Reubin Milan, MD  ?Icosapent Ethyl (VASCEPA) 0.5 g CAPS Take 2 capsules by mouth 2 (two) times daily. 04/03/21  Yes Reubin Milan, MD  ?lansoprazole (PREVACID) 15 MG capsule Take 15 mg by mouth daily at 12 noon.   Yes [provider]  ?losartan (COZAAR) 100 MG tablet Take 1 tablet (100 mg total) by mouth daily. 03/07/21  Yes Reubin Milan, MD  ?metoprolol succinate (TOPROL-XL) 100 MG 24 hr tablet Take 0.5 tablets (50 mg  total) by mouth in the morning and at bedtime. 03/07/21  Yes Reubin Milan, MD  ?Multiple Vitamins-Minerals (MULTIVITAMIN WITH MINERALS) tablet Take 1 tablet by mouth daily.   Yes [provider]  ?TRULICITY 0.75 MG/0.5ML SOPN INJECT 0.75 MG INTO THE SKIN ONCE A WEEK. 12/18/20  Yes Reubin Milan, MD  ?diphenoxylate-atropine (LOMOTIL) 2.5-0.025 MG tablet Take 1 tablet by mouth 4 (four) times daily as needed for diarrhea or loose stools. 05/08/21   Reubin Milan, MD  ?glucose blood test strip 1 each by Other route daily. Use as instructed    [provider]  ?ondansetron (ZOFRAN-ODT)  4 MG disintegrating tablet Take 1 tablet (4 mg total) by mouth every 6 (six) hours as needed for nausea or vomiting. 05/06/21   Sharyn Creamer, MD  ?valACYclovir (VALTREX) 1000 MG tablet TAKE 1 TABLET BY MOUTH TWICE A DAY 03/22/21   Reubin Milan, MD  ? ? ?Family History ?Family History  ?Problem Relation Age of Onset  ? Hypertension Mother   ? Stroke Mother   ? Hyperlipidemia Mother   ? Heart disease Mother   ?     heart attack  ? Diabetes Father   ? Cancer Father   ?     lung  ? Diabetes Sister   ? Hyperlipidemia Sister   ? Stroke Paternal Grandfather   ? Hypertension Paternal Grandfather   ? Stroke Maternal Grandfather   ? Alzheimer's disease Paternal Grandmother   ? Diabetes Sister   ? Cancer Sister   ?     breast  ? Breast cancer Sister 57  ? Breast cancer Paternal Aunt   ? ? ?Social History ?Social History  ? ?Tobacco Use  ? Smoking status: Former  ?  Types: Cigarettes  ?  Quit date: 06/14/1984  ?  Years since quitting: 36.9  ? Smokeless tobacco: Never  ?Vaping Use  ? Vaping Use: Never used  ?Substance Use Topics  ? Alcohol use: No  ?  Alcohol/week: 0.0 standard drinks  ? Drug use: No  ? ? ? ?Allergies   ?Rabeprazole sodium, Augmentin [amoxicillin-pot clavulanate], Benazepril hcl, Penicillins, and Jardiance [empagliflozin] ? ? ?Review of Systems ?Review of Systems  ?Constitutional:  Negative for appetite change, chills and fever.  ?HENT:  Positive for congestion, ear pain, hearing loss and sinus pressure. Negative for rhinorrhea, sinus pain and sore throat.   ?Eyes:  Negative for redness and visual disturbance.  ?Respiratory:  Positive for cough. Negative for chest tightness, shortness of breath and wheezing.   ?Cardiovascular:  Negative for chest pain and palpitations.  ?Gastrointestinal:  Negative for abdominal pain, constipation, diarrhea, nausea and vomiting.  ?Genitourinary:  Negative for dysuria, frequency and urgency.  ?Musculoskeletal:  Negative for myalgias.  ?Neurological:  Negative for dizziness,  weakness and headaches.  ?Psychiatric/Behavioral:  Negative for confusion.   ?All other systems reviewed and are negative. ? ? ?Physical Exam ?Triage Vital Signs ?ED Triage Vitals  ?Enc Vitals Group  ?   BP 05/20/21 0945 139/71  ?   Pulse Rate 05/20/21 0945 70  ?   Resp 05/20/21 0945 14  ?   Temp 05/20/21 0945 98.3 ?F (36.8 ?C)  ?   Temp Source 05/20/21 0945 Oral  ?   SpO2 05/20/21 0945 100 %  ?   Weight 05/20/21 0941 114 lb (51.7 kg)  ?   Height 05/20/21 0941 5\' 3"  (1.6 m)  ?   Head Circumference --   ?   Peak Flow --   ?  Pain Score 05/20/21 0941 5  ?   Pain Loc --   ?   Pain Edu? --   ?   Excl. in GC? --   ? ?No data found. ? ?Updated Vital Signs ?BP 139/71 (BP Location: Left Arm)   Pulse 70   Temp 98.3 ?F (36.8 ?C) (Oral)   Resp 14   Ht 5\' 3"  (1.6 m)   Wt 114 lb (51.7 kg)   LMP  (LMP Unknown)   SpO2 100%   BMI 20.19 kg/m?  ? ?Visual Acuity ?Right Eye Distance:   ?Left Eye Distance:   ?Bilateral Distance:   ? ?Right Eye Near:   ?Left Eye Near:    ?Bilateral Near:    ? ?Physical Exam ?Vitals reviewed.  ?Constitutional:   ?   Appearance: Normal appearance. She is not ill-appearing.  ?HENT:  ?   Head: Normocephalic and atraumatic.  ?   Right Ear: Hearing, tympanic membrane, ear canal and external ear normal. No swelling or tenderness. No middle ear effusion. There is impacted cerumen. No mastoid tenderness. Tympanic membrane is not injected, scarred, perforated, erythematous, retracted or bulging.  ?   Left Ear: Hearing, tympanic membrane, ear canal and external ear normal. No swelling or tenderness.  No middle ear effusion. There is no impacted cerumen. No mastoid tenderness. Tympanic membrane is not injected, scarred, perforated, erythematous, retracted or bulging.  ?   Ears:  ?   Comments: R TM initially fully occluded by cerumen. Following lavage performed by nurse, TM appeared healthy and intact; pearly gray with cone of light. Tolerated this well. ?   Nose:  ?   Right Sinus: Maxillary sinus tenderness  present.  ?   Left Sinus: Maxillary sinus tenderness present.  ?   Mouth/Throat:  ?   Pharynx: Oropharynx is clear. No oropharyngeal exudate or posterior oropharyngeal erythema.  ?Cardiovascular:  ?   Rate and Rh

## 2021-05-22 ENCOUNTER — Encounter: Payer: Self-pay | Admitting: Internal Medicine

## 2021-06-02 ENCOUNTER — Other Ambulatory Visit: Payer: Self-pay | Admitting: Internal Medicine

## 2021-06-02 DIAGNOSIS — E118 Type 2 diabetes mellitus with unspecified complications: Secondary | ICD-10-CM

## 2021-06-02 NOTE — Telephone Encounter (Signed)
Requested Prescriptions  ?Pending Prescriptions Disp Refills  ?? TRULICITY 0.75 MG/0.5ML SOPN [Pharmacy Med Name: TRULICITY 0.75 MG/0.5 ML PEN] 15 mL 0  ?  Sig: INJECT 0.75 MG INTO THE SKIN ONCE A WEEK.  ?  ? Endocrinology:  Diabetes - GLP-1 Receptor Agonists Passed - 06/02/2021 10:25 AM  ?  ?  Passed - HBA1C is between 0 and 7.9 and within 180 days  ?  HB A1C (BAYER DCA - WAIVED)  ?Date Value Ref Range Status  ?07/28/2014 6.9 <7.0 % Final  ?  Comment:  ?                                        Diabetic Adult            <7.0 ?                                      Healthy Adult        4.3 - 5.7 ?                                                          (DCCT/NGSP) ?American Diabetes Association's Summary of Glycemic Recommendations ?for Adults with Diabetes: Hemoglobin A1c <7.0%. More stringent ?glycemic goals (A1c <6.0%) may further reduce complications at the ?cost of increased risk of hypoglycemia. ?  ? ?Hgb A1c MFr Bld  ?Date Value Ref Range Status  ?04/03/2021 6.1 (H) 4.8 - 5.6 % Final  ?  Comment:  ?           Prediabetes: 5.7 - 6.4 ?         Diabetes: >6.4 ?         Glycemic control for adults with diabetes: <7.0 ?  ?   ?  ?  Passed - Valid encounter within last 6 months  ?  Recent Outpatient Visits   ?      ? 3 weeks ago Diarrhea, unspecified type  ? [facility] Reubin Milan, MD  ? 2 months ago Essential hypertension  ? [facility] Reubin Milan, MD  ? 6 months ago Type II diabetes mellitus with complication [facility])  ? [facility] Reubin Milan, MD  ? 10 months ago Annual physical exam  ? [facility] Reubin Milan, MD  ? 1 year ago Acute non-recurrent maxillary sinusitis  ? [facility] Reubin Milan, MD  ?  ?  ?Future Appointments   ?        ? In 1 month Judithann Graves Nyoka Cowden, MD [facility]  ?  ? ?  ?  ?  ? ? ?

## 2021-06-06 ENCOUNTER — Ambulatory Visit: Payer: Managed Care, Other (non HMO) | Admitting: Gastroenterology

## 2021-06-25 ENCOUNTER — Other Ambulatory Visit: Payer: Self-pay | Admitting: Internal Medicine

## 2021-06-25 DIAGNOSIS — I10 Essential (primary) hypertension: Secondary | ICD-10-CM

## 2021-06-27 NOTE — Telephone Encounter (Signed)
Requested Prescriptions  ?Pending Prescriptions Disp Refills  ?? DULoxetine (CYMBALTA) 60 MG capsule [Pharmacy Med Name: DULOXETINE HCL DR 60 MG CAP] 90 capsule 0  ?  Sig: TAKE 1 CAPSULE BY MOUTH EVERY DAY  ?  ? Psychiatry: Antidepressants - SNRI - duloxetine Failed - 06/25/2021  9:32 AM  ?  ?  Failed - Cr in normal range and within 360 days  ?  Creatinine  ?Date Value Ref Range Status  ?07/08/2013 0.92 0.60 - 1.30 mg/dL Final  ? ?Creatinine, Ser  ?Date Value Ref Range Status  ?05/06/2021 1.20 (H) 0.44 - 1.00 mg/dL Final  ?   ?  ?  Passed - eGFR is 30 or above and within 360 days  ?  EGFR (African American)  ?Date Value Ref Range Status  ?07/08/2013 >60  Final  ? ?GFR calc Af Wyvonnia Lora  ?Date Value Ref Range Status  ?07/21/2019 89 >59 mL/min/1.73 Final  ?  Comment:  ?  **[facility] currently reports eGFR in compliance with the current** ?  recommendations of the [employer]. [facility] will ?  update reporting as new guidelines are published from the NKF-ASN ?  Task force. ?  ? ?EGFR (Non-African Amer.)  ?Date Value Ref Range Status  ?07/08/2013 >60  Final  ?  Comment:  ?  eGFR values <24m/min/1.73 m2 may be an indication of chronic ?kidney disease (CKD). ?Calculated eGFR is useful in patients with stable renal function. ?The eGFR calculation will not be reliable in acutely ill patients ?when serum creatinine is changing rapidly. It is not useful in  ?patients on dialysis. The eGFR calculation may not be applicable ?to patients at the low and high extremes of body sizes, pregnant ?women, and vegetarians. ?  ? ?GFR, Estimated  ?Date Value Ref Range Status  ?05/06/2021 51 (L) >60 mL/min Final  ?  Comment:  ?  (NOTE) ?Calculated using the CKD-EPI Creatinine Equation (2021) ?  ? ?eGFR  ?Date Value Ref Range Status  ?04/03/2021 75 >59 mL/min/1.73 Final  ?   ?  ?  Passed - Completed PHQ-2 or PHQ-9 in the last 360 days  ?  ?  Passed - Last BP in normal range  ?  BP Readings from Last 1 Encounters:  ?05/20/21 139/71  ?    ?  ?  Passed - Valid encounter within last 6 months  ?  Recent Outpatient Visits   ?      ? 1 month [facility]  ? [facility]  ? 6 months ago [facility] II diabetes [facility]  ? 11 months [facility]  [facility]  ? 1 year ago Acute non-recurrent maxillary sinusitis  ? [facility]  ?  ?  ?Future Appointments   ?        ? In 4 weeks [facility]  ?  ? ?  ?  ?  ?? amLODipine (NORVASC) 5 MG tablet [Pharmacy Med Name: AMLODIPINE BESYLATE 5 MG TAB] 90 tablet 0  ?  Sig: TAKE 1 TABLET (5 MG TOTAL) BY MOUTH DAILY.  ?  ? Cardiovascular: Calcium Channel Blockers 2 Passed - 06/25/2021  9:32 AM  ?  ?  Passed - Last BP  in normal range  ?  BP Readings from Last 1 Encounters:  ?05/20/21 139/71  ?   ?  ?  Passed - Last Heart Rate in normal range  ?  Pulse Readings from Last 1 Encounters:  ?05/20/21 70  ?   ?  ?  Passed - Valid encounter within last 6 months  ?  Recent Outpatient Visits   ?      ? 1 month ago Diarrhea, unspecified type  ? [facility] Glean Hess, MD  ? 2 months ago Essential hypertension  ? [facility] Glean Hess, MD  ? 6 months ago Type II diabetes mellitus with complication [facility])  ? [facility] Glean Hess, MD  ? 11 months ago Annual physical exam  ? [facility] Glean Hess, MD  ? 1 year ago Acute non-recurrent maxillary sinusitis  ? [facility] Glean Hess, MD  ?  ?  ?Future Appointments   ?        ? In 4 weeks Glean Hess, MD [place]  ?  ? ?  ?  ?  ? ? ?l ? ?

## 2021-07-01 ENCOUNTER — Other Ambulatory Visit: Payer: Self-pay | Admitting: Internal Medicine

## 2021-07-01 DIAGNOSIS — E1169 Type 2 diabetes mellitus with other specified complication: Secondary | ICD-10-CM

## 2021-07-03 NOTE — Telephone Encounter (Signed)
Requested medication (s) are due for refill today: Yes  Requested medication (s) are on the active medication list: Yes  Last refill:  04/03/21  Future visit scheduled: Yes  Notes to clinic:  Off protocol.    Requested Prescriptions  Pending Prescriptions Disp Refills   Icosapent Ethyl 0.5 g CAPS [Pharmacy Med Name: ICOSAPENT ETHYL 500 MG CAPSULE] 360 capsule 0    Sig: TAKE 2 CAPSULES BY MOUTH 2 TIMES DAILY.     Off-Protocol Failed - 07/01/2021  9:09 AM      Failed - Medication not assigned to a protocol, review manually.      Passed - Valid encounter within last 12 months    Recent Outpatient Visits           1 month ago Diarrhea, unspecified type   [facility] Glean Hess, MD   3 months ago Essential hypertension   [place], [place]   7 months ago Type II diabetes mellitus with complication [facility])   [facility] Glean Hess, MD   11 months ago Annual physical exam   [facility] Glean Hess, MD   1 year ago Acute non-recurrent maxillary sinusitis   [facility] Glean Hess, MD       Future Appointments             In 3 weeks Army Melia Jesse Sans, MD [facility], [facility]

## 2021-07-26 ENCOUNTER — Ambulatory Visit (INDEPENDENT_AMBULATORY_CARE_PROVIDER_SITE_OTHER): Payer: Managed Care, Other (non HMO) | Admitting: Internal Medicine

## 2021-07-26 ENCOUNTER — Encounter: Payer: Self-pay | Admitting: Internal Medicine

## 2021-07-26 VITALS — BP 128/70 | HR 72 | Ht 63.0 in | Wt 118.6 lb

## 2021-07-26 DIAGNOSIS — Z0000 Encounter for general adult medical examination without abnormal findings: Secondary | ICD-10-CM

## 2021-07-26 DIAGNOSIS — E1169 Type 2 diabetes mellitus with other specified complication: Secondary | ICD-10-CM

## 2021-07-26 DIAGNOSIS — I10 Essential (primary) hypertension: Secondary | ICD-10-CM | POA: Diagnosis not present

## 2021-07-26 DIAGNOSIS — E785 Hyperlipidemia, unspecified: Secondary | ICD-10-CM

## 2021-07-26 DIAGNOSIS — E118 Type 2 diabetes mellitus with unspecified complications: Secondary | ICD-10-CM

## 2021-07-26 DIAGNOSIS — M797 Fibromyalgia: Secondary | ICD-10-CM | POA: Diagnosis not present

## 2021-07-26 DIAGNOSIS — F411 Generalized anxiety disorder: Secondary | ICD-10-CM

## 2021-07-26 DIAGNOSIS — K219 Gastro-esophageal reflux disease without esophagitis: Secondary | ICD-10-CM

## 2021-07-26 LAB — POCT URINALYSIS DIPSTICK
Bilirubin, UA: NEGATIVE
Blood, UA: NEGATIVE
Glucose, UA: NEGATIVE
Ketones, UA: NEGATIVE
Leukocytes, UA: NEGATIVE
Nitrite, UA: NEGATIVE
Protein, UA: NEGATIVE
Spec Grav, UA: 1.01 (ref 1.010–1.025)
Urobilinogen, UA: 0.2 E.U./dL
pH, UA: 6 (ref 5.0–8.0)

## 2021-07-26 NOTE — Progress Notes (Signed)
Date:  07/26/2021   Name:  Tracy Bryan   DOB:  05-26-1960   MRN:  [id number]   Chief Complaint: Annual Exam Tracy Bryan is a 61 y.o. female who presents today for her Complete Annual Exam. She feels well. She reports walking. She reports she is sleeping poorly. Breast complaints - none.  Diarrhea has resolved after stopping metformin and Vascepa.  Colonoscopy was cancelled.  Mammogram: 03/2021 DEXA: nne Pap smear: discontinued Colonoscopy: 07/2013 repeat 10 yrs  Health Maintenance Due  Topic Date Due   Zoster Vaccines- Shingrix (2 of 2) 05/29/2021    Immunization History  Administered Date(s) Administered   Influenza Split 11/07/2016   Influenza,inj,Quad PF,6+ Mos 11/28/2017, 11/17/2018, 11/10/2019, 11/30/2020   Influenza-Unspecified 11/20/2013, 11/13/2014   PFIZER(Purple Top)SARS-COV-2 Vaccination 06/04/2019, 06/30/2019   Pneumococcal Polysaccharide-23 12/20/2010   Tdap 11/20/2013, 07/30/2016   Zoster Recombinat (Shingrix) 04/03/2021    Hypertension This is a chronic problem. The problem is controlled. Pertinent negatives include no chest pain, headaches, palpitations or shortness of breath. Past treatments include calcium channel blockers, angiotensin blockers and beta blockers.  Diabetes She presents for her follow-up diabetic visit. She has type 2 diabetes mellitus. Her disease course has been stable. Pertinent negatives for hypoglycemia include no dizziness, headaches, nervousness/anxiousness or tremors. Pertinent negatives for diabetes include no chest pain, no fatigue, no polydipsia and no polyuria. Current diabetic treatments: Trulicity alone - metformin stopped due to diarrhea.  Hyperlipidemia This is a chronic problem. The problem is controlled. Pertinent negatives include no chest pain or shortness of breath. Current antihyperlipidemic treatment includes statins (Vascepa stopped due to diarrhea). The current treatment provides moderate improvement of  lipids.  Gastroesophageal Reflux She complains of heartburn. She reports no abdominal pain, no chest pain, no coughing or no wheezing. This is a recurrent problem. The problem occurs rarely. Pertinent negatives include no fatigue. She has tried a PPI for the symptoms.  Fibromyalgia - on Cymbalta for many years. Stable symptoms doing well. Anxiety - started on Buspar last visit and has had some benefit.  Lab Results  Component Value Date   NA 135 05/06/2021   K 5.1 05/06/2021   CO2 17 (L) 05/06/2021   GLUCOSE 121 (H) 05/06/2021   BUN 38 (H) 05/06/2021   CREATININE 1.20 (H) 05/06/2021   CALCIUM 10.1 05/06/2021   EGFR 75 04/03/2021   GFRNONAA 51 (L) 05/06/2021   Lab Results  Component Value Date   CHOL 156 04/03/2021   HDL 49 04/03/2021   LDLCALC 64 04/03/2021   TRIG 270 (H) 04/03/2021   CHOLHDL 3.2 04/03/2021   Lab Results  Component Value Date   TSH 1.430 07/22/2020   Lab Results  Component Value Date   HGBA1C 6.1 (H) 04/03/2021   Lab Results  Component Value Date   WBC 9.6 05/06/2021   HGB 14.4 05/06/2021   HCT 43.6 05/06/2021   MCV 87.7 05/06/2021   PLT 349 05/06/2021   Lab Results  Component Value Date   ALT 27 05/06/2021   AST 26 05/06/2021   ALKPHOS 54 05/06/2021   BILITOT 0.9 05/06/2021   Lab Results  Component Value Date   VD25OH 52.8 05/28/2017     Review of Systems  Constitutional:  Negative for chills, fatigue and fever.  HENT:  Negative for congestion, hearing loss, tinnitus, trouble swallowing and voice change.   Eyes:  Negative for visual disturbance.  Respiratory:  Negative for cough, chest tightness, shortness of breath and wheezing.   Cardiovascular:  Negative for chest pain, palpitations and leg swelling.  Gastrointestinal:  Positive for heartburn. Negative for abdominal pain, constipation, diarrhea and vomiting.  Endocrine: Negative for polydipsia and polyuria.  Genitourinary:  Negative for dysuria, frequency, genital sores, vaginal  bleeding and vaginal discharge.  Musculoskeletal:  Negative for arthralgias, gait problem and joint swelling.  Skin:  Negative for color change and rash.  Neurological:  Negative for dizziness, tremors, light-headedness and headaches.  Hematological:  Negative for adenopathy. Does not bruise/bleed easily.  Psychiatric/Behavioral:  Negative for dysphoric mood and sleep disturbance. The patient is not nervous/anxious.     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   Sleep disorder 11/10/2019   Bilateral carpal tunnel syndrome 11/07/2016   Herpes simplex infection 11/23/2015   Fibromyalgia 03/11/2015   Menopause syndrome 02/17/2015   Esophageal reflux 02/17/2015   Vitamin D deficiency 02/17/2015   Hyperlipidemia associated with type 2 diabetes mellitus ([place]) 07/28/2014   Essential hypertension 07/28/2014   Type II diabetes mellitus with complication (HCC) 78/46/9629    Allergies  Allergen Reactions   Rabeprazole Sodium     C Diff Side Affects   Augmentin [Amoxicillin-Pot Clavulanate] Itching   Benazepril Hcl Cough   Penicillins Other (See Comments)   Jardiance [Empagliflozin] Rash    Recurrent yeast vaginitis    Past Surgical History:  Procedure Laterality Date   CERVICAL DISC SURGERY     COLONOSCOPY  07/22/2013   DILATION AND CURETTAGE OF UTERUS  1996   HERNIA REPAIR  5284   Umblical   HIP ARTHROPLASTY Right    TOTAL ABDOMINAL HYSTERECTOMY     TOTAL HIP ARTHROPLASTY Left 05/18/2014   Procedure: LEFT TOTAL HIP ARTHROPLASTY ANTERIOR APPROACH;  Surgeon: Melrose Nakayama, MD;  Location: [place];  Service: Orthopedics;  Laterality: Left;    Social History   Tobacco Use   Smoking status: Former    Types: Cigarettes    Quit date: 06/14/1984    Years since quitting: 37.1   Smokeless tobacco: Never  Vaping Use   Vaping Use: Never used  Substance Use Topics   Alcohol use: No     Alcohol/week: 0.0 standard drinks of alcohol   Drug use: No     Medication list has been reviewed and updated.  Current Meds  Medication Sig   amLODipine (NORVASC) 5 MG tablet TAKE 1 TABLET (5 MG TOTAL) BY MOUTH DAILY.   aspirin 81 MG tablet Take 81 mg by mouth daily.   atorvastatin (LIPITOR) 40 MG tablet TAKE 1 TABLET BY MOUTH EVERY DAY   busPIRone (BUSPAR) 10 MG tablet Take 10 mg by mouth 3 (three) times daily.   Cholecalciferol (VITAMIN D) 2000 units CAPS Take 1 capsule (2,000 Units total) by mouth daily.   diphenoxylate-atropine (LOMOTIL) 2.5-0.025 MG tablet Take 1 tablet by mouth 4 (four) times daily as needed for diarrhea or loose stools.   DULoxetine (CYMBALTA) 60 MG capsule TAKE 1 CAPSULE BY MOUTH EVERY DAY   glucose blood test strip 1 each by Other route daily. Use as instructed   lansoprazole (PREVACID) 15 MG capsule Take 15 mg by mouth daily at 12 noon.   losartan (COZAAR) 100 MG tablet Take 1 tablet (100 mg total) by mouth daily.   metoprolol succinate (TOPROL-XL) 100 MG 24 hr tablet Take 0.5 tablets (50 mg total) by mouth in the morning and at bedtime.   Multiple Vitamins-Minerals (MULTIVITAMIN WITH MINERALS) tablet Take 1  tablet by mouth daily.   ondansetron (ZOFRAN-ODT) 4 MG disintegrating tablet Take 1 tablet (4 mg total) by mouth every 6 (six) hours as needed for nausea or vomiting.   TRULICITY 2.40 XB/3.5HG SOPN INJECT 0.75 MG INTO THE SKIN ONCE A WEEK.   valACYclovir (VALTREX) 1000 MG tablet TAKE 1 TABLET BY MOUTH TWICE A DAY       07/26/2021    9:42 AM 05/08/2021    2:08 PM 04/03/2021   10:21 AM 11/30/2020    9:04 AM  GAD 7 : Generalized Anxiety Score  Nervous, Anxious, on Edge '2 2 1 3  ' Control/stop worrying '2 2 1 3  ' Worry too much - different things '2 2 1 3  ' Trouble relaxing '1 2 1 3  ' Restless '2 2 2 3  ' Easily annoyed or irritable '2 2 2 3  ' Afraid - awful might happen 1 1 0 2  Total GAD 7 Score '12 13 8 20  ' Anxiety Difficulty Very difficult Somewhat difficult   Somewhat difficult       07/26/2021    9:42 AM  Depression screen PHQ 2/9  Decreased Interest 1  Down, Depressed, Hopeless 0  PHQ - 2 Score 1  Altered sleeping 2  Change in appetite 0  Feeling bad or failure about yourself  1  Trouble concentrating 0  Moving slowly or fidgety/restless 1  Suicidal thoughts 0  PHQ-9 Score 5  Difficult doing work/chores Not difficult at all    BP Readings from Last 3 Encounters:  07/26/21 128/70  05/20/21 139/71  05/11/21 (!) 94/56    Physical Exam Vitals and nursing note reviewed.  Constitutional:      General: She is not in acute distress.    Appearance: She is well-developed.  HENT:     Head: Normocephalic and atraumatic.     Right Ear: Tympanic membrane and ear canal normal.     Left Ear: Tympanic membrane and ear canal normal.     Nose:     Right Sinus: No maxillary sinus tenderness.     Left Sinus: No maxillary sinus tenderness.  Eyes:     General: No scleral icterus.       Right eye: No discharge.        Left eye: No discharge.     Conjunctiva/sclera: Conjunctivae normal.  Neck:     Thyroid: No thyromegaly.     Vascular: No carotid bruit.  Cardiovascular:     Rate and Rhythm: Normal rate and regular rhythm.     Pulses: Normal pulses.     Heart sounds: Normal heart sounds.  Pulmonary:     Effort: Pulmonary effort is normal. No respiratory distress.     Breath sounds: No wheezing.  Chest:  Breasts:    Right: No mass, nipple discharge, skin change or tenderness.     Left: No mass, nipple discharge, skin change or tenderness.  Abdominal:     General: Bowel sounds are normal.     Palpations: Abdomen is soft.     Tenderness: There is no abdominal tenderness.  Musculoskeletal:     Cervical back: Normal range of motion. No erythema.     Right lower leg: No edema.     Left lower leg: No edema.  Lymphadenopathy:     Cervical: No cervical adenopathy.  Skin:    General: Skin is warm and dry.     Findings: No rash.   Neurological:     Mental Status: She is alert and oriented to  person, place, and time.     Cranial Nerves: No cranial nerve deficit.     Sensory: No sensory deficit.     Deep Tendon Reflexes: Reflexes are normal and symmetric.  Psychiatric:        Attention and Perception: Attention normal.        Mood and Affect: Mood normal.     Wt Readings from Last 3 Encounters:  07/26/21 118 lb 9.6 oz (53.8 kg)  05/20/21 114 lb (51.7 kg)  05/11/21 111 lb (50.3 kg)    BP 128/70   Pulse 72   Ht '5\' 3"'  (1.6 m)   Wt 118 lb 9.6 oz (53.8 kg)   LMP  (LMP Unknown)   SpO2 100%   BMI 21.01 kg/m   Assessment and Plan: 1. Annual physical exam Normal exam. Continue healthy diet and exercise Up to date on screenings and immunizations.  2. Essential hypertension Clinically stable exam with well controlled BP. Pt to continue current regimen and low sodium diet; benefits of regular exercise as able discussed. - CBC with Differential/Platelet - POCT urinalysis dipstick  3. Type II diabetes mellitus with complication (HCC) Clinically stable by exam and report without s/s of hypoglycemia. DM complicated by hypertension and dyslipidemia. Metformin discontinued due to possible diarrhea SE - consider resuming if A1C is higher. Tolerating medications well without side effects or other concerns. - Comprehensive metabolic panel - Hemoglobin A1c  4. Hyperlipidemia associated with type 2 diabetes mellitus ([place]) On statin - vascepa stopped and diarrhea resolved. Consider resuming Vascepa if trigs are much higher  Continue low fat diet - Lipid panel  5. Fibromyalgia Doing well on Cymbalta Continue daily exercise - TSH  6. Gastroesophageal reflux disease, unspecified whether esophagitis present Symptoms well controlled on daily PPI No red flag signs such as weight loss, n/v, melena Will continue Prevacid - CBC with Differential/Platelet  7. Generalized anxiety disorder GAD-7 scores unchanged  but patient reports subjective improvement on Buspar. Will continue same dose.   Partially dictated using Editor, commissioning. Any errors are unintentional.  Halina Maidens, MD [facility]  07/26/2021

## 2021-07-27 LAB — LIPID PANEL
Chol/HDL Ratio: 3.4 ratio (ref 0.0–4.4)
Cholesterol, Total: 158 mg/dL (ref 100–199)
HDL: 47 mg/dL (ref >39)
LDL Chol Calc (NIH): 59 mg/dL (ref 0–99)
Triglycerides: 337 mg/dL — ABNORMAL HIGH (ref 0–149)
VLDL Cholesterol Cal: 52 mg/dL — ABNORMAL HIGH (ref 5–40)

## 2021-07-27 LAB — CBC WITH DIFFERENTIAL/PLATELET
Basophils Absolute: 0.2 10*3/uL (ref 0.0–0.2)
Basos: 2 %
EOS (ABSOLUTE): 0.3 10*3/uL (ref 0.0–0.4)
Eos: 4 %
Hematocrit: 40 % (ref 34.0–46.6)
Hemoglobin: 13.6 g/dL (ref 11.1–15.9)
Immature Grans (Abs): 0 10*3/uL (ref 0.0–0.1)
Immature Granulocytes: 0 %
Lymphocytes Absolute: 2.2 10*3/uL (ref 0.7–3.1)
Lymphs: 29 %
MCH: 29.6 pg (ref 26.6–33.0)
MCHC: 34 g/dL (ref 31.5–35.7)
MCV: 87 fL (ref 79–97)
Monocytes Absolute: 0.6 10*3/uL (ref 0.1–0.9)
Monocytes: 8 %
Neutrophils Absolute: 4.2 10*3/uL (ref 1.4–7.0)
Neutrophils: 57 %
Platelets: 312 10*3/uL (ref 150–450)
RBC: 4.59 x10E6/uL (ref 3.77–5.28)
RDW: 13 % (ref 11.7–15.4)
WBC: 7.5 10*3/uL (ref 3.4–10.8)

## 2021-07-27 LAB — COMPREHENSIVE METABOLIC PANEL
ALT: 24 IU/L (ref 0–32)
AST: 18 IU/L (ref 0–40)
Albumin/Globulin Ratio: 1.9 (ref 1.2–2.2)
Albumin: 4.7 g/dL (ref 3.8–4.8)
Alkaline Phosphatase: 67 IU/L (ref 44–121)
BUN/Creatinine Ratio: 22 (ref 12–28)
BUN: 20 mg/dL (ref 8–27)
Bilirubin Total: 0.4 mg/dL (ref 0.0–1.2)
CO2: 23 mmol/L (ref 20–29)
Calcium: 10.6 mg/dL — ABNORMAL HIGH (ref 8.7–10.3)
Chloride: 100 mmol/L (ref 96–106)
Creatinine, Ser: 0.9 mg/dL (ref 0.57–1.00)
Globulin, Total: 2.5 g/dL (ref 1.5–4.5)
Glucose: 112 mg/dL — ABNORMAL HIGH (ref 70–99)
Potassium: 5.6 mmol/L — ABNORMAL HIGH (ref 3.5–5.2)
Sodium: 139 mmol/L (ref 134–144)
Total Protein: 7.2 g/dL (ref 6.0–8.5)
eGFR: 72 mL/min/{1.73_m2} (ref >59)

## 2021-07-27 LAB — HEMOGLOBIN A1C
Est. average glucose Bld gHb Est-mCnc: 137 mg/dL
Hgb A1c MFr Bld: 6.4 % — ABNORMAL HIGH (ref 4.8–5.6)

## 2021-07-27 LAB — TSH: TSH: 0.936 u[IU]/mL (ref 0.450–4.500)

## 2021-07-30 ENCOUNTER — Other Ambulatory Visit: Payer: Self-pay | Admitting: Internal Medicine

## 2021-07-30 DIAGNOSIS — E118 Type 2 diabetes mellitus with unspecified complications: Secondary | ICD-10-CM

## 2021-07-31 NOTE — Telephone Encounter (Signed)
dc'd 05/20/21 by Marin Roberts PA-C (caused diarrhea) Requested Prescriptions  Refused Prescriptions Disp Refills  . metFORMIN (GLUCOPHAGE-XR) 500 MG 24 hr tablet [Pharmacy Med Name: METFORMIN HCL ER 500 MG TABLET] 180 tablet 1    Sig: TAKE 1 TABLET BY MOUTH TWICE A DAY     Endocrinology:  Diabetes - Biguanides Failed - 07/30/2021  9:28 AM      Failed - B12 Level in normal range and within 720 days    No results found for: "VITAMINB12"       Passed - Cr in normal range and within 360 days    Creatinine  Date Value Ref Range Status  07/08/2013 0.92 0.60 - 1.30 mg/dL Final   Creatinine, Ser  Date Value Ref Range Status  07/26/2021 0.90 0.57 - 1.00 mg/dL Final         Passed - HBA1C is between 0 and 7.9 and within 180 days    HB A1C (BAYER DCA - WAIVED)  Date Value Ref Range Status  07/28/2014 6.9 <7.0 % Final    Comment:                                          Diabetic Adult            <7.0                                       Healthy Adult        4.3 - 5.7                                                           (DCCT/NGSP) American Diabetes Association's Summary of Glycemic Recommendations for Adults with Diabetes: Hemoglobin A1c <7.0%. More stringent glycemic goals (A1c <6.0%) may further reduce complications at the cost of increased risk of hypoglycemia.    Hgb A1c MFr Bld  Date Value Ref Range Status  07/26/2021 6.4 (H) 4.8 - 5.6 % Final    Comment:             Prediabetes: 5.7 - 6.4          Diabetes: >6.4          Glycemic control for adults with diabetes: <7.0          Passed - eGFR in normal range and within 360 days    EGFR (African American)  Date Value Ref Range Status  07/08/2013 >60  Final   GFR calc Af Amer  Date Value Ref Range Status  07/21/2019 89 >59 mL/min/1.73 Final    Comment:    **[facility] currently reports eGFR in compliance with the current**   recommendations of the [employer]. [facility] will   update reporting as new  guidelines are published from the NKF-ASN   Task force.    EGFR (Non-African Amer.)  Date Value Ref Range Status  07/08/2013 >60  Final    Comment:    eGFR values <66m/min/1.73 m2 may be an indication of chronic kidney disease (CKD). Calculated eGFR is useful in patients with stable renal function. The eGFR calculation will not be reliable  in acutely ill patients when serum creatinine is changing rapidly. It is not useful in  patients on dialysis. The eGFR calculation may not be applicable to patients at the low and high extremes of body sizes, pregnant women, and vegetarians.    GFR, Estimated  Date Value Ref Range Status  05/06/2021 51 (L) >60 mL/min Final    Comment:    (NOTE) Calculated using the CKD-EPI Creatinine Equation (2021)    eGFR  Date Value Ref Range Status  07/26/2021 72 >59 mL/min/1.73 Final         Passed - Valid encounter within last 6 months    Recent Outpatient Visits          5 days ago Annual physical exam   [facility] Glean Hess, MD   2 months ago Diarrhea, unspecified type   [facility] Glean Hess, MD   3 months ago Essential hypertension   [facility] Glean Hess, MD   8 months ago Type II diabetes mellitus with complication [facility])   [facility] Glean Hess, MD   1 year ago Annual physical exam   [facility] Glean Hess, MD      Future Appointments            In 4 months Army Melia Jesse Sans, MD [facility], [place]   In 1 year Glean Hess, MD [facility]           Passed - CBC within normal limits and completed in the last 12 months    WBC  Date Value Ref Range Status  07/26/2021 7.5 3.4 - 10.8 x10E3/uL Final  05/06/2021 9.6 4.0 - 10.5 K/uL Final   RBC  Date Value Ref Range Status  07/26/2021 4.59 3.77 - 5.28 x10E6/uL Final  05/06/2021 4.97 3.87 - 5.11 MIL/uL Final   Hemoglobin  Date Value Ref Range Status  07/26/2021  13.6 11.1 - 15.9 g/dL Final   Hematocrit  Date Value Ref Range Status  07/26/2021 40.0 34.0 - 46.6 % Final   MCHC  Date Value Ref Range Status  07/26/2021 34.0 31.5 - 35.7 g/dL Final  05/06/2021 33.0 30.0 - 36.0 g/dL Final   [facility]  Date Value Ref Range Status  07/26/2021 29.6 26.6 - 33.0 pg Final  05/06/2021 29.0 26.0 - 34.0 pg Final   MCV  Date Value Ref Range Status  07/26/2021 87 79 - 97 fL Final  09/28/2013 85 80 - 100 fL Final   No results found for: "PLTCOUNTKUC", "LABPLAT", "POCPLA" RDW  Date Value Ref Range Status  07/26/2021 13.0 11.7 - 15.4 % Final  09/28/2013 12.9 11.5 - 14.5 % Final

## 2021-08-02 ENCOUNTER — Ambulatory Visit (INDEPENDENT_AMBULATORY_CARE_PROVIDER_SITE_OTHER): Payer: Managed Care, Other (non HMO)

## 2021-08-02 DIAGNOSIS — Z23 Encounter for immunization: Secondary | ICD-10-CM

## 2021-08-17 ENCOUNTER — Other Ambulatory Visit: Payer: Self-pay | Admitting: Family Medicine

## 2021-08-17 ENCOUNTER — Other Ambulatory Visit: Payer: Self-pay | Admitting: Internal Medicine

## 2021-08-17 DIAGNOSIS — F411 Generalized anxiety disorder: Secondary | ICD-10-CM

## 2021-08-17 DIAGNOSIS — E118 Type 2 diabetes mellitus with unspecified complications: Secondary | ICD-10-CM

## 2021-08-17 DIAGNOSIS — I10 Essential (primary) hypertension: Secondary | ICD-10-CM

## 2021-08-17 DIAGNOSIS — E1169 Type 2 diabetes mellitus with other specified complication: Secondary | ICD-10-CM

## 2021-08-17 NOTE — Telephone Encounter (Signed)
Requested too soon . Requested Prescriptions  Pending Prescriptions Disp Refills  . Icosapent Ethyl 0.5 g CAPS [Pharmacy Med Name: ICOSAPENT ETHYL 500 MG CAPSULE] 360 capsule 0    Sig: TAKE 2 CAPSULES BY MOUTH TWICE A DAY     Off-Protocol Failed - 08/17/2021  8:25 AM      Failed - Medication not assigned to a protocol, review manually.      Passed - Valid encounter within last 12 months    Recent Outpatient Visits          3 weeks ago Annual physical exam   [facility] Glean Hess, MD   3 months ago Diarrhea, unspecified type   [facility] Glean Hess, MD   4 months ago Essential hypertension   [place], [place]   8 months ago Type II diabetes mellitus with complication [facility])   [facility] Glean Hess, MD   1 year ago Annual physical exam   [facility] Glean Hess, MD      Future Appointments            In 3 months Army Melia Jesse Sans, MD [facility], [place]   In 11 months Army Melia Jesse Sans, MD [facility], [facility]           Refused Prescriptions Disp Refills  . DULoxetine (CYMBALTA) 60 MG capsule [Pharmacy Med Name: DULOXETINE HCL DR 60 MG CAP] 90 capsule 0    Sig: TAKE 1 CAPSULE BY MOUTH EVERY DAY     Psychiatry: Antidepressants - SNRI - duloxetine Passed - 08/17/2021  8:25 AM      Passed - Cr in normal range and within 360 days    Creatinine  Date Value Ref Range Status  07/08/2013 0.92 0.60 - 1.30 mg/dL Final   Creatinine, Ser  Date Value Ref Range Status  07/26/2021 0.90 0.57 - 1.00 mg/dL Final         Passed - eGFR is 30 or above and within 360 days    EGFR (African American)  Date Value Ref Range Status  07/08/2013 >60  Final   GFR calc Af Amer  Date Value Ref Range Status  07/21/2019 89 >59 mL/min/1.73 Final    Comment:    **[facility] currently reports eGFR in compliance with the current**   recommendations of the [employer]. [facility] will    update reporting as new guidelines are published from the NKF-ASN   Task force.    EGFR (Non-African Amer.)  Date Value Ref Range Status  07/08/2013 >60  Final    Comment:    eGFR values <86m/min/1.73 m2 may be an indication of chronic kidney disease (CKD). Calculated eGFR is useful in patients with stable renal function. The eGFR calculation will not be reliable in acutely ill patients when serum creatinine is changing rapidly. It is not useful in  patients on dialysis. The eGFR calculation may not be applicable to patients at the low and high extremes of body sizes, pregnant women, and vegetarians.    GFR, Estimated  Date Value Ref Range Status  05/06/2021 51 (L) >60 mL/min Final    Comment:    (NOTE) Calculated using the CKD-EPI Creatinine Equation (2021)    eGFR  Date Value Ref Range Status  07/26/2021 72 >59 mL/min/1.73 Final         Passed - Completed PHQ-2 or PHQ-9 in the last 360 days      Passed -  Last BP in normal range    BP Readings from Last 1 Encounters:  07/26/21 128/70         Passed - Valid encounter within last 6 months    Recent Outpatient Visits          3 weeks ago Annual physical exam   [facility] Glean [facility], MD   3 months ago Diarrhea, unspecified type   [facility], [facility]   4 months ago Essential hypertension   [facility] Glean [facility], MD   8 months ago Type II diabetes mellitus with complication [facility])   [facility] Glean Hess, MD   1 year ago Annual physical exam   [facility] Glean Hess, MD      Future Appointments            In 3 months Army Melia Jesse Sans, MD [facility], [place]   In 11 months Glean Hess, MD [facility]           . amLODipine (NORVASC) 5 MG tablet [Pharmacy Med Name: AMLODIPINE BESYLATE 5 MG TAB] 90 tablet 0    Sig: TAKE 1 TABLET (5 MG TOTAL) BY MOUTH DAILY.     Cardiovascular: Calcium  Channel Blockers 2 Passed - 08/17/2021  8:25 AM      Passed - Last BP in normal range    BP Readings from Last 1 Encounters:  07/26/21 128/70         Passed - Last Heart Rate in normal range    Pulse Readings from Last 1 Encounters:  07/26/21 72         Passed - Valid encounter within last 6 months    Recent Outpatient Visits          3 weeks ago Annual physical exam   [facility] Glean Hess, MD   3 months ago Diarrhea, unspecified type   [facility] Glean Hess, MD   4 months ago Essential hypertension   [place], MD   8 months ago Type II diabetes mellitus with complication [facility])   [facility] Glean Hess, MD   1 year ago Annual physical exam   [facility] Glean [facility], MD      Future Appointments            In 3 months Army Melia Jesse Sans, MD [facility], [place]   In 11 months Army Melia Jesse Sans, MD [facility], [place]           . Dulaglutide (TRULICITY) 0.62 BJ/6.2GB SOPN [Pharmacy Med Name: TRULICITY 1.51 VO/1.6 ML PEN] 15 mL 0    Sig: INJECT 0.75 MG SUBCUTANEOUSLY ONE TIME PER WEEK     Endocrinology:  Diabetes - GLP-1 Receptor Agonists Passed - 08/17/2021  8:25 AM      Passed - HBA1C is between 0 and 7.9 and within 180 days    HB A1C (BAYER DCA - WAIVED)  Date Value Ref Range Status  07/28/2014 6.9 <7.0 % Final    Comment:                                          Diabetic Adult            <7.0  Healthy Adult        4.3 - 5.7                                                           (DCCT/NGSP) American Diabetes Association's Summary of Glycemic Recommendations for Adults with Diabetes: Hemoglobin A1c <7.0%. More stringent glycemic goals (A1c <6.0%) may further reduce complications at the cost of increased risk of hypoglycemia.    Hgb A1c MFr Bld  Date Value Ref Range Status  07/26/2021 6.4 (H) 4.8 - 5.6 % Final     Comment:             Prediabetes: 5.7 - 6.4          Diabetes: >6.4          Glycemic control for adults with diabetes: <7.0          Passed - Valid encounter within last 6 months    Recent Outpatient Visits          3 weeks ago Annual physical exam   [facility], MD   3 months ago Diarrhea, unspecified type   [facility] Glean Hess, MD   4 months ago Essential hypertension   [place], MD   8 months ago Type II diabetes mellitus with complication [facility])   [facility] Glean Hess, MD   1 year ago Annual physical exam   [facility] Glean [facility], MD      Future Appointments            In 3 months Army Melia Jesse Sans, MD [facility], [place]   In 11 months Army Melia Jesse Sans, MD [facility], [facility]

## 2021-08-17 NOTE — Telephone Encounter (Signed)
Next appointment 11/29/21 Requested Prescriptions  Pending Prescriptions Disp Refills  . busPIRone (BUSPAR) 10 MG tablet [Pharmacy Med Name: BUSPIRONE HCL 10 MG TABLET] 270 tablet 1    Sig: TAKE 1 TABLET BY MOUTH THREE TIMES A DAY     Psychiatry: Anxiolytics/Hypnotics - Non-controlled Passed - 08/17/2021  8:25 AM      Passed - Valid encounter within last 12 months    Recent Outpatient Visits          3 weeks ago Annual physical exam   [facility] Reubin Milan, MD   3 months ago Diarrhea, unspecified type   [facility] Reubin Milan, MD   4 months ago Essential hypertension   [facility] Reubin Milan, MD   8 months ago Type II diabetes mellitus with complication [facility])   [facility] Reubin Milan, MD   1 year ago Annual physical exam   [facility] Reubin [facility], MD      Future Appointments            In 3 months Reubin Milan, MD [facility]   In 11 months Reubin Milan, MD [facility]           . metoprolol succinate (TOPROL-XL) 100 MG 24 hr tablet [Pharmacy Med Name: METOPROLOL SUCC ER 100 MG TAB] 90 tablet 0    Sig: TAKE 0.5 TABLETS (50 MG TOTAL) BY MOUTH IN THE MORNING AND AT BEDTIME.     Cardiovascular:  Beta Blockers Passed - 08/17/2021  8:25 AM      Passed - Last BP in normal range    BP Readings from Last 1 Encounters:  07/26/21 128/70         Passed - Last Heart Rate in normal range    Pulse Readings from Last 1 Encounters:  07/26/21 72         Passed - Valid encounter within last 6 months    Recent Outpatient Visits          3 weeks ago Annual physical exam   [facility] Reubin [facility], MD   3 months ago Diarrhea, unspecified type   [facility] Reubin Milan, MD   4 months ago Essential hypertension   [facility] Reubin Milan, MD   8 months ago Type II diabetes mellitus with complication [facility])   [facility] Reubin Milan, MD   1 year ago Annual physical exam   [facility] Reubin Milan, MD      Future Appointments            In 3 months Reubin Milan, MD [facility]   In 11 months Reubin Milan, MD [facility]           . losartan (COZAAR) 100 MG tablet [Pharmacy Med Name: LOSARTAN POTASSIUM 100 MG TAB] 90 tablet 0    Sig: TAKE 1 TABLET BY MOUTH EVERY DAY     Cardiovascular:  Angiotensin Receptor Blockers Failed - 08/17/2021  8:25 AM      Failed - K in normal range and within 180 days    Potassium  Date Value Ref Range Status  07/26/2021 5.6 (H) 3.5 - 5.2 mmol/L Final  07/08/2013 3.5 3.5 - 5.1 mmol/L Final         Passed - Cr in normal range and within 180 days  Creatinine  Date Value Ref Range Status  07/08/2013 0.92 0.60 - 1.30 mg/dL Final   Creatinine, Ser  Date Value Ref Range Status  07/26/2021 0.90 0.57 - 1.00 mg/dL Final         Passed - Patient is not pregnant      Passed - Last BP in normal range    BP Readings from Last 1 Encounters:  07/26/21 128/70         Passed - Valid encounter within last 6 months    Recent Outpatient Visits          3 weeks ago Annual physical exam   [facility] Reubin [facility], MD   3 months ago Diarrhea, unspecified type   [facility] Reubin Milan, MD   4 months ago Essential hypertension   [facility] Reubin Milan, MD   8 months ago Type II diabetes mellitus with complication [facility])   [facility] Reubin Milan, MD   1 year ago Annual physical exam   [facility] Reubin Milan, MD      Future Appointments            In 3 months Judithann Graves Nyoka Cowden, MD [facility], [facility]   In 11 months Judithann Graves Nyoka Cowden, MD [facility], [facility]

## 2021-08-17 NOTE — Telephone Encounter (Signed)
Requested medication (s) are due for refill today- yes  Requested medication (s) are on the active medication list -yes  Future visit scheduled -yes  Last refill: 06/14/21  Notes to clinic:  listed as historical medication   Requested Prescriptions  Pending Prescriptions Disp Refills   busPIRone (BUSPAR) 10 MG tablet [Pharmacy Med Name: BUSPIRONE HCL 10 MG TABLET] 270 tablet 1    Sig: TAKE 1 TABLET BY MOUTH THREE TIMES A DAY     Psychiatry: Anxiolytics/Hypnotics - Non-controlled Passed - 08/17/2021  8:25 AM      Passed - Valid encounter within last 12 months    Recent Outpatient Visits           3 weeks ago Annual physical exam   [facility] Re[facility], MD   3 months ago Diarrhea, unspecified type   [facility] Reubin [facility], MD   4 months ago Essential hypertension   [facility] Reubin Milan, MD   8 months ago Type II diabetes mellitus with complication [facility])   [facility] Reubin Milan, MD   1 year ago Annual physical exam   [facility] Reubin Milan, MD       Future Appointments             In 3 months Reubin Milan, MD [facility]   In 11 months Reubin Milan, MD [facility]            Signed Prescriptions Disp Refills   metoprolol succinate (TOPROL-XL) 100 MG 24 hr tablet 90 tablet 0    Sig: TAKE 0.5 TABLETS (50 MG TOTAL) BY MOUTH IN THE MORNING AND AT BEDTIME.     Cardiovascular:  Beta Blockers Passed - 08/17/2021  8:25 AM      Passed - Last BP in normal range    BP Readings from Last 1 Encounters:  07/26/21 128/70         Passed - Last Heart Rate in normal range    Pulse Readings from Last 1 Encounters:  07/26/21 72         Passed - Valid encounter within last 6 months    Recent Outpatient Visits           3 weeks ago Annual physical exam   [facility] Reubin Milan, MD   3 months ago Diarrhea, unspecified type   [facility] Reubin Milan, MD   4 months ago Essential hypertension   [facility] Reubin Milan, MD   8 months ago Type II diabetes mellitus with complication [facility])   [facility] Reubin Milan, MD   1 year ago Annual physical exam   [facility] Reubin Milan, MD       Future Appointments             In 3 months Judithann Graves Nyoka Cowden, MD [facility]   In 11 months Reubin Milan, MD [facility], [facility]             losartan (COZAAR) 100 MG tablet 90 tablet 0    Sig: TAKE 1 TABLET BY MOUTH EVERY DAY     Cardiovascular:  Angiotensin Receptor Blockers Failed - 08/17/2021  8:25 AM      Failed - K in normal range and within 180 days    Potassium  Date Value Ref Range Status  07/26/2021 5.6 (  H) 3.5 - 5.2 mmol/L Final  07/08/2013 3.5 3.5 - 5.1 mmol/L Final         Passed - Cr in normal range and within 180 days    Creatinine  Date Value Ref Range Status  07/08/2013 0.92 0.60 - 1.30 mg/dL Final   Creatinine, Ser  Date Value Ref Range Status  07/26/2021 0.90 0.57 - 1.00 mg/dL Final         Passed - Patient is not pregnant      Passed - Last BP in normal range    BP Readings from Last 1 Encounters:  07/26/21 128/70         Passed - Valid encounter within last 6 months    Recent Outpatient Visits           3 weeks ago Annual physical exam   [facility] Reubin Milan, MD   3 months ago Diarrhea, unspecified type   [facility] Reubin Milan, MD   4 months ago Essential hypertension   [facility] Reubin Milan, MD   8 months ago Type II diabetes mellitus with complication [facility])   [facility] Reubin Milan, MD   1 year ago Annual physical exam   [facility] Reubin Milan, MD       Future Appointments             In 3 months Judithann Graves Nyoka Cowden, MD [facility]   In 11 months Judithann Graves, Nyoka Cowden, MD [facility], [facility]               Requested Prescriptions  Pending Prescriptions Disp Refills   busPIRone (BUSPAR) 10 MG tablet [Pharmacy Med Name: BUSPIRONE HCL 10 MG TABLET] 270 tablet 1    Sig: TAKE 1 TABLET BY MOUTH THREE TIMES A DAY     Psychiatry: Anxiolytics/Hypnotics - Non-controlled Passed - 08/17/2021  8:25 AM      Passed - Valid encounter within last 12 months    Recent Outpatient Visits           3 weeks ago Annual physical exam   [facility] Reubin Milan, MD   3 months ago Diarrhea, unspecified type   [facility] Reubin Milan, MD   4 months ago Essential hypertension   [facility] Reubin Milan, MD   8 months ago Type II diabetes mellitus with complication [facility])   [facility] Reubin Milan, MD   1 year ago Annual physical exam   [facility] Reubin Milan, MD       Future Appointments             In 3 months Reubin Milan, MD [facility]   In 11 months Reubin Milan, MD [facility], [facility]            Signed Prescriptions Disp Refills   metoprolol succinate (TOPROL-XL) 100 MG 24 hr tablet 90 tablet 0    Sig: TAKE 0.5 TABLETS (50 MG TOTAL) BY MOUTH IN THE MORNING AND AT BEDTIME.     Cardiovascular:  Beta Blockers Passed - 08/17/2021  8:25 AM      Passed - Last BP in normal range    BP Readings from Last 1 Encounters:  07/26/21 128/70         Passed - Last Heart Rate in normal range    Pulse Readings from  Last 1 Encounters:  07/26/21 72         Passed - Valid encounter within last 6 months    Recent Outpatient Visits           3 weeks ago Annual physical exam   [facility] Reubin Milan, MD   3 months ago Diarrhea, unspecified type   [facility] Re[facility], [facility]   4 months ago Essential hypertension   [facility] Reubin Milan, MD   8 months ago Type II diabetes mellitus with complication [facility])   [facility] Reubin [facility], MD   1 year ago Annual physical exam   [facility] Reubin Milan, MD       Future Appointments             In 3 months Reubin Milan, MD [facility], [facility]   In 11 months Reubin Milan, MD [facility], [facility]             losartan (COZAAR) 100 MG tablet 90 tablet 0    Sig: TAKE 1 TABLET BY MOUTH EVERY DAY     Cardiovascular:  Angiotensin Receptor Blockers Failed - 08/17/2021  8:25 AM      Failed - K in normal range and within 180 days    Potassium  Date Value Ref Range Status  07/26/2021 5.6 (H) 3.5 - 5.2 mmol/L Final  07/08/2013 3.5 3.5 - 5.1 mmol/L Final         Passed - Cr in normal range and within 180 days    Creatinine  Date Value Ref Range Status  07/08/2013 0.92 0.60 - 1.30 mg/dL Final   Creatinine, Ser  Date Value Ref Range Status  07/26/2021 0.90 0.57 - 1.00 mg/dL Final         Passed - Patient is not pregnant      Passed - Last BP in normal range    BP Readings from Last 1 Encounters:  07/26/21 128/70         Passed - Valid encounter within last 6 months    Recent Outpatient Visits           3 weeks ago Annual physical exam   [facility] Reubin Milan, MD   3 months ago Diarrhea, unspecified type   [facility] Reubin Milan, MD   4 months ago Essential hypertension   [facility] Reubin Milan, MD   8 months ago Type II diabetes mellitus with complication [facility])   [facility] Reubin Milan, MD   1 year ago Annual physical exam   [facility] Reubin Milan, MD       Future Appointments             In 3 months Judithann Graves Nyoka Cowden, MD [facility], [facility]   In 11 months Judithann Graves Nyoka Cowden, MD [facility], [facility]

## 2021-08-17 NOTE — Telephone Encounter (Signed)
Requested medication (s) are due for refill today: no   Requested medication (s) are on the active medication list: yes   Last refill:  07/03/21 # 360 0 refills  Future visit scheduled: no  Notes to clinic:  medication not assigned to a protocol. Do you want to refill now ?

## 2021-08-18 NOTE — Telephone Encounter (Signed)
Historical Provider, are you prescribing now?

## 2021-08-27 ENCOUNTER — Other Ambulatory Visit: Payer: Self-pay | Admitting: Internal Medicine

## 2021-08-27 DIAGNOSIS — E118 Type 2 diabetes mellitus with unspecified complications: Secondary | ICD-10-CM

## 2021-08-29 NOTE — Telephone Encounter (Signed)
Requested Prescriptions  Pending Prescriptions Disp Refills  . Dulaglutide (TRULICITY) 0.75 MG/0.5ML SOPN [Pharmacy Med Name: TRULICITY 0.75 MG/0.5 ML PEN] 6 mL 0    Sig: INJECT 0.75 MG SUBCUTANEOUSLY ONE TIME PER WEEK     Endocrinology:  Diabetes - GLP-1 Receptor Agonists Passed - 08/27/2021 10:23 AM      Passed - HBA1C is between 0 and 7.9 and within 180 days    HB A1C (BAYER DCA - WAIVED)  Date Value Ref Range Status  07/28/2014 6.9 <7.0 % Final    Comment:                                          Diabetic Adult            <7.0                                       Healthy Adult        4.3 - 5.7                                                           (DCCT/NGSP) American Diabetes Association's Summary of Glycemic Recommendations for Adults with Diabetes: Hemoglobin A1c <7.0%. More stringent glycemic goals (A1c <6.0%) may further reduce complications at the cost of increased risk of hypoglycemia.    Hgb A1c MFr Bld  Date Value Ref Range Status  07/26/2021 6.4 (H) 4.8 - 5.6 % Final    Comment:             Prediabetes: 5.7 - 6.4          Diabetes: >6.4          Glycemic control for adults with diabetes: <7.0          Passed - Valid encounter within last 6 months    Recent Outpatient Visits          1 month ago Annual physical exam   [facility] Reubin [facility], MD   3 months ago Diarrhea, unspecified type   [facility] Reubin Milan, MD   4 months ago Essential hypertension   [facility] Reubin Milan, MD   9 months ago Type II diabetes mellitus with complication [facility])   [facility] Reubin Milan, MD   1 year ago Annual physical exam   [facility] Reubin Milan, MD      Future Appointments            In 3 months Judithann Graves Nyoka Cowden, MD [facility], [facility]   In 11 months Judithann Graves Nyoka Cowden, MD [facility], [facility]

## 2021-09-08 ENCOUNTER — Other Ambulatory Visit: Payer: Self-pay | Admitting: Internal Medicine

## 2021-09-08 DIAGNOSIS — E1169 Type 2 diabetes mellitus with other specified complication: Secondary | ICD-10-CM

## 2021-09-08 NOTE — Telephone Encounter (Signed)
Requested Prescriptions  Pending Prescriptions Disp Refills  . atorvastatin (LIPITOR) 40 MG tablet [Pharmacy Med Name: ATORVASTATIN 40 MG TABLET] 90 tablet 3    Sig: TAKE 1 TABLET BY MOUTH EVERY DAY     Cardiovascular:  Antilipid - Statins Failed - 09/08/2021  2:22 AM      Failed - Lipid Panel in normal range within the last 12 months    Cholesterol, Total  Date Value Ref Range Status  07/26/2021 158 100 - 199 mg/dL Final   Cholesterol Piccolo, Waived  Date Value Ref Range Status  07/28/2014 CANCELED mg/dL     Comment:    No green top heparin tube submitted.                         Desirable                <200                         Borderline High      200- 239                         High                     >239  Result canceled by the ancillary    LDL Chol Calc (NIH)  Date Value Ref Range Status  07/26/2021 59 0 - 99 mg/dL Final   HDL  Date Value Ref Range Status  07/26/2021 47 >39 mg/dL Final   Triglycerides  Date Value Ref Range Status  07/26/2021 337 (H) 0 - 149 mg/dL Final   Triglycerides Piccolo,Waived  Date Value Ref Range Status  07/28/2014 CANCELED      Comment:    Test not performed  Result canceled by the ancillary          Passed - Patient is not pregnant      Passed - Valid encounter within last 12 months    Recent Outpatient Visits          1 month ago Annual physical exam   [facility] Reubin Milan, MD   4 months ago Diarrhea, unspecified type   [facility]   5 months ago Essential hypertension   [facility] Reubin Milan, MD   9 months ago Type II diabetes mellitus with complication [facility])   [facility] Reubin Milan, MD   1 year ago Annual physical exam   [facility] Reubin Milan, MD      Future Appointments            In 2 months Judithann Graves Nyoka Cowden, MD [facility], [facility]   In 10 months Judithann Graves Nyoka Cowden, MD [facility], [facility]

## 2021-09-30 ENCOUNTER — Other Ambulatory Visit: Payer: Self-pay | Admitting: Internal Medicine

## 2021-09-30 DIAGNOSIS — E118 Type 2 diabetes mellitus with unspecified complications: Secondary | ICD-10-CM

## 2021-09-30 DIAGNOSIS — I10 Essential (primary) hypertension: Secondary | ICD-10-CM

## 2021-09-30 DIAGNOSIS — E1169 Type 2 diabetes mellitus with other specified complication: Secondary | ICD-10-CM

## 2021-10-03 NOTE — Telephone Encounter (Signed)
Requested Prescriptions  Pending Prescriptions Disp Refills  . losartan (COZAAR) 100 MG tablet [Pharmacy Med Name: LOSARTAN POTASSIUM 100 MG TAB] 90 tablet     Sig: TAKE 1 TABLET BY MOUTH EVERY DAY     Cardiovascular:  Angiotensin Receptor Blockers Failed - 09/30/2021  6:05 PM      Failed - K in normal range and within 180 days    Potassium  Date Value Ref Range Status  07/26/2021 5.6 (H) 3.5 - 5.2 mmol/L Final  07/08/2013 3.5 3.5 - 5.1 mmol/L Final         Passed - Cr in normal range and within 180 days    Creatinine  Date Value Ref Range Status  07/08/2013 0.92 0.60 - 1.30 mg/dL Final   Creatinine, Ser  Date Value Ref Range Status  07/26/2021 0.90 0.57 - 1.00 mg/dL Final         Passed - Patient is not pregnant      Passed - Last BP in normal range    BP Readings from Last 1 Encounters:  07/26/21 128/70         Passed - Valid encounter within last 6 months    Recent Outpatient Visits          2 months ago Annual physical exam   [place] [facility] at [facility], Jesse Sans, MD   4 months ago Diarrhea, unspecified type   [place] [facility] at [facility], Jesse Sans, MD   6 months ago Essential hypertension   [place] [facility] at [facility], Jesse Sans, MD   10 months ago Type II diabetes mellitus with complication [facility])   [place] [facility] at [facility], Jesse Sans, MD   1 year ago Annual physical exam   [facility] at [facility], Jesse Sans, MD      Future Appointments            In 1 month Army Melia, Jesse Sans, MD [facility] Health Primary Care and Sports [facility] at [facility], [facility]   In 10 months Army Melia, Jesse Sans, MD [facility] Primary Care and Sports Medicine at [facility], [facility]           . DULoxetine (CYMBALTA) 60 MG capsule [Pharmacy Med Name: DULOXETINE HCL  DR 60 MG CAP] 90 capsule 0    Sig: TAKE 1 CAPSULE BY MOUTH EVERY DAY     Psychiatry: Antidepressants - SNRI - duloxetine Passed - 09/30/2021  6:05 PM      Passed - Cr in normal range and within 360 days    Creatinine  Date Value Ref Range Status  07/08/2013 0.92 0.60 - 1.30 mg/dL Final   Creatinine, Ser  Date Value Ref Range Status  07/26/2021 0.90 0.57 - 1.00 mg/dL Final         Passed - eGFR is 30 or above and within 360 days    EGFR (African American)  Date Value Ref Range Status  07/08/2013 >60  Final   GFR calc Af Amer  Date Value Ref Range Status  07/21/2019 89 >59 mL/min/1.73 Final    Comment:    **[facility] currently reports eGFR in compliance with the current**   recommendations of the [employer]. [facility] will   update reporting as new guidelines are published from the NKF-ASN   Task force.    EGFR (  Laqueta Jean.)  Date Value Ref Range Status  07/08/2013 >60  Final    Comment:    eGFR values <31m/min/1.73 m2 may be an indication of chronic kidney disease (CKD). Calculated eGFR is useful in patients with stable renal function. The eGFR calculation will not be reliable in acutely ill patients when serum creatinine is changing rapidly. It is not useful in  patients on dialysis. The eGFR calculation may not be applicable to patients at the low and high extremes of body sizes, pregnant women, and vegetarians.    GFR, Estimated  Date Value Ref Range Status  05/06/2021 51 (L) >60 mL/min Final    Comment:    (NOTE) Calculated using the CKD-EPI Creatinine Equation (2021)    eGFR  Date Value Ref Range Status  07/26/2021 72 >59 mL/min/1.73 Final         Passed - Completed PHQ-2 or PHQ-9 in the last 360 days      Passed - Last BP in normal range    BP Readings from Last 1 Encounters:  07/26/21 128/70         Passed - Valid encounter within last 6 months    Recent Outpatient Visits          2 months ago Annual physical exam   [facility]   4 months ago Diarrhea, unspecified type   [facility]   6 months ago Essential hypertension   [place] [facility]   10 months ago Type II diabetes mellitus with complication ([facility]   [place] [facility] L[facility]se Sans MD   1 year ago Annual physical exam   [facility]      Future Appointments            In 1 month BArmy MeliaLJesse Sans MD [facility] at [facility]  In 10 months BArmy MeliaLJesse Sans MD [facility] [place]          . amLODipine (NORVASC) 5 MG tablet [Pharmacy Med Name: AMLODIPINE BESYLATE 5 MG TAB] 90 tablet 0    Sig: TAKE 1 TABLET (5 MG TOTAL) BY MOUTH DAILY.     Cardiovascular: Calcium Channel Blockers 2 Passed - 09/30/2021  6:05 PM      Passed - Last BP in normal range    BP Readings from Last 1 Encounters:  07/26/21 128/70         Passed - Last Heart Rate in normal range    Pulse Readings from Last 1 Encounters:  07/26/21 72         Passed - Valid encounter within last 6 months    Recent Outpatient Visits          2 months ago Annual physical exam   [place] [facility] at [facility] L[facility]se Sans MD   4 months ago Diarrhea, unspecified type   [facility]   6 months ago Essential hypertension   [place] [facility] L[facility] Sans MD  10 months ago Type II diabetes mellitus with complication [facility])   [place] [facility] at [facility], Jesse Sans, MD   1 year ago Annual physical exam   [place] [facility] at [facility], Jesse Sans, MD      Future Appointments            In 1 month Army Melia Jesse Sans, MD [facility] Health Primary [facility] and [facility] at [facility], [facility]   In 10 months Glean Hess, MD [facility] at [facility]           . metFORMIN (GLUCOPHAGE-XR) 500 MG 24 hr tablet [Pharmacy Med Name: METFORMIN HCL ER 500 MG TABLET] 180 tablet     Sig: TAKE 1 TABLET BY [place] A DAY     Endocrinology:  Diabetes - Biguanides Failed - 09/30/2021  6:05 PM      Failed - B12 Level in normal range and within 720 days    No results found for: "VITAMINB12"       Passed - Cr in normal range and within 360 days    Creatinine  Date Value Ref Range Status  07/08/2013 0.92 0.60 - 1.30 mg/dL Final   Creatinine, Ser  Date Value Ref Range Status  07/26/2021 0.90 0.57 - 1.00 mg/dL Final         Passed - HBA1C is between 0 and 7.9 and within 180 days    HB A1C (BAYER DCA - WAIVED)  Date Value Ref Range Status  07/28/2014 6.9 <7.0 % Final    Comment:                                          Diabetic Adult            <7.0                                       Healthy Adult        4.3 - 5.7                                                           (DCCT/NGSP) American Diabetes Association's Summary of Glycemic Recommendations for Adults with Diabetes: Hemoglobin A1c <7.0%. More stringent glycemic goals (A1c <6.0%) may further reduce complications at the cost of increased risk of hypoglycemia.    Hgb A1c MFr Bld  Date Value Ref Range Status  07/26/2021 6.4 (H) 4.8 - 5.6 % Final    Comment:             Prediabetes: 5.7 - 6.4          Diabetes: >6.4          Glycemic control for adults with diabetes: <7.0          Passed - eGFR in normal range and within 360 days    EGFR (African American)  Date Value Ref Range  Status  07/08/2013 >60  Final   GFR calc Af Wyvonnia Lora  Date Value Ref Range Status  07/21/2019 89 >59 mL/min/1.73 Final    Comment:    **[facility] currently reports eGFR in compliance with the current**   recommendations of the [employer]. [facility] will   update reporting as new guidelines are published from the NKF-ASN   Task force.    EGFR (Non-African Amer.)  Date Value Ref Range Status  07/08/2013 >60  Final    Comment:    eGFR values <81m/min/1.73 m2 may be an indication of chronic kidney disease (CKD). Calculated eGFR is useful in patients with stable renal function. The eGFR calculation will not be reliable in acutely ill patients when serum creatinine is changing rapidly. It is not useful in  patients on dialysis. The eGFR calculation may not be applicable to patients at the low and high extremes of body sizes, pregnant women, and vegetarians.    GFR, Estimated  Date Value Ref Range Status  05/06/2021 51 (L) >60 mL/min Final    Comment:    (NOTE) Calculated using the CKD-EPI Creatinine Equation (2021)    eGFR  Date Value Ref Range Status  07/26/2021 72 >59 mL/min/1.73 Final         Passed - Valid encounter within last 6 months    Recent Outpatient Visits          2 months ago Annual physical exam   [facility]   4 months ago Diarrhea, unspecified type   [facility]   6 months ago Essential hypertension   [place] [facility]   10 months ago Type II diabetes mellitus with complication ([facility]   [place] [place] [facility] L[facility] MD   1 year ago Annual physical exam   [place] [facility] Sports Medicine at [facility] LJesse Sans MD      Future Appointments             In 1 month BArmy Melia LJesse Sans MD [facility] at [facility]  In 10 months BGlean Hess MD CP[facility] Primary [facility] and Sports Medicine at [facility] PHot Springswithin normal limits and completed in the last 12 months    WBC  Date Value Ref Range Status  07/26/2021 7.5 3.4 - 10.8 x10E3/uL Final  05/06/2021 9.6 4.0 - 10.5 K/uL Final   RBC  Date Value Ref Range Status  07/26/2021 4.59 3.77 - 5.28 x10E6/uL Final  05/06/2021 4.97 3.87 - 5.11 MIL/uL Final   Hemoglobin  Date Value Ref Range Status  07/26/2021 13.6 11.1 - 15.9 g/dL Final   Hematocrit  Date Value Ref Range Status  07/26/2021 40.0 34.0 - 46.6 % Final   MCHC  Date Value Ref Range Status  07/26/2021 34.0 31.5 - 35.7 g/dL Final  05/06/2021 33.0 30.0 - 36.0 g/dL Final   [facility] Date Value Ref Range Status  07/26/2021 29.6 26.6 - 33.0 pg Final  05/06/2021 29.0 26.0 - 34.0 pg Final   MCV  Date Value Ref Range Status  07/26/2021 87 79 - 97 fL Final  09/28/2013 85 80 - 100 fL Final   No results found for: "PLTCOUNTKUC", "LABPLAT", "POCPLA" RDW  Date Value Ref Range Status  07/26/2021 13.0 11.7 - 15.4 % Final  09/28/2013 12.9 11.5 - 14.5 % Final         . TRULICITY 0.34 VQ/2.5ZD SOPN [Pharmacy Med Name: TRULICITY 6.38 VF/6.4 ML PEN]      Sig: INJECT 0.75 MG SUBCUTANEOUSLY ONE TIME PER WEEK     Endocrinology:  Diabetes - GLP-1 Receptor Agonists Passed - 09/30/2021  6:05 PM      Passed - HBA1C is between 0 and 7.9 and within 180 days    HB A1C (BAYER DCA - WAIVED)  Date Value Ref Range Status  07/28/2014 6.9 <7.0 % Final    Comment:                                          Diabetic Adult            <7.0                                       Healthy Adult        4.3 - 5.7                                                           (DCCT/NGSP) American Diabetes Association's Summary of Glycemic Recommendations for Adults with Diabetes:  Hemoglobin A1c <7.0%. More stringent glycemic goals (A1c <6.0%) may further reduce complications at the cost of increased risk of hypoglycemia.    Hgb A1c MFr Bld  Date Value Ref Range Status  07/26/2021 6.4 (H) 4.8 - 5.6 % Final    Comment:             Prediabetes: 5.7 - 6.4          Diabetes: >6.4          Glycemic control for adults with diabetes: <7.0          Passed - Valid encounter within last 6 months    Recent Outpatient Visits          2 months ago Annual physical exam   [place] [facility] at [facility], Jesse Sans, MD   4 months ago Diarrhea, unspecified type   [facility], Jesse Sans, MD   6 months ago Essential hypertension   [place] [facility], Jesse Sans, MD   10 months ago Type II diabetes mellitus with complication [facility])   [place] [facility] at [facility], Jesse Sans, MD   1 year ago Annual physical exam   [facility], Jesse Sans, MD      Future Appointments            In 1 month Army Melia Jesse Sans, MD [facility] Health Primary Care and Sports [facility] at [facility], [facility]   In 10 months Army Melia Jesse Sans, MD [facility] and Sports Medicine at [facility]           . metoprolol succinate (TOPROL-XL) 100 MG  24 hr tablet [Pharmacy Med Name: METOPROLOL SUCC ER 100 MG TAB] 90 tablet     Sig: TAKE 0.5 TABLETS (50 MG TOTAL) BY MOUTH IN THE MORNING AND AT BEDTIME.     Cardiovascular:  Beta Blockers Passed - 09/30/2021  6:05 PM      Passed - Last BP in normal range    BP Readings from Last 1 Encounters:  07/26/21 128/70         Passed - Last Heart Rate in normal range    Pulse Readings from Last 1 Encounters:  07/26/21 72         Passed - Valid encounter within last 6 months    Recent  Outpatient Visits          2 months ago Annual physical exam   [place] [facility] at [facility], Jesse Sans, MD   4 months ago Diarrhea, unspecified type   [facility] at [facility], Jesse Sans, MD   6 months ago Essential hypertension   [place] [facility] at [facility], Jesse Sans, MD   10 months ago Type II diabetes mellitus with complication [facility])   [place] [facility] at [facility], Jesse Sans, MD   1 year ago Annual physical exam   [facility] at [facility], Jesse Sans, MD      Future Appointments            In 1 month Army Melia, Jesse Sans, MD [facility] at [facility], [facility]   In 10 months Army Melia, Jesse Sans, MD [facility] Primary Care and Sports Medicine at [facility], [facility]           . Icosapent Ethyl 0.5 g CAPS [Pharmacy Med Name: ICOSAPENT ETHYL 500 MG CAPSULE] 360 capsule     Sig: TAKE 2 CAPSULES BY MOUTH TWICE A DAY     Off-Protocol Failed - 09/30/2021  6:05 PM      Failed - Medication not assigned to a protocol, review manually.      Passed - Valid encounter within last 12 months    Recent Outpatient Visits          2 months ago Annual physical exam   [place] [facility], Jesse Sans, MD   4 months ago Diarrhea, unspecified type   [facility] at [facility], Jesse Sans, MD   6 months ago Essential hypertension   [place] [facility] at [facility], Jesse Sans, MD   10 months ago Type II diabetes mellitus with complication [facility])   [place] [facility] at [facility], Jesse Sans, MD   1 year ago Annual physical exam   [facility] Health Primary Care and Sports Medicine at  [facility], Jesse Sans, MD      Future Appointments            In 1 month Army Melia, Jesse Sans, MD [facility] Health Primary Care and Sports Medicine at [facility], [facility]   In 10 months Army Melia, Jesse Sans, MD [place] Primary Care and Sports Medicine at [facility], [facility]

## 2021-10-03 NOTE — Telephone Encounter (Signed)
Requested medication (s) are due for refill today: yes  Requested medication (s) are on the active medication list: yes  Last refill:  07/03/21 #360  Future visit scheduled: yes  Notes to clinic:  med not assigned to a protocol   Requested Prescriptions  Pending Prescriptions Disp Refills   Icosapent Ethyl 0.5 g CAPS [Pharmacy Med Name: ICOSAPENT ETHYL 500 MG CAPSULE] 360 capsule     Sig: TAKE 2 CAPSULES BY MOUTH TWICE A DAY     Off-Protocol Failed - 09/30/2021  6:05 PM      Failed - Medication not assigned to a protocol, review manually.      Passed - Valid encounter within last 12 months    Recent Outpatient Visits           2 months ago Annual physical exam   [place] [facility] at [facility], Jesse Sans, MD   4 months ago Diarrhea, unspecified type   [facility] Primary Care and Sports Medicine at [facility], Jesse Sans, MD   6 months ago Essential hypertension   [place] [facility] and Sports Medicine at [facility], Jesse Sans, MD   10 months ago Type II diabetes mellitus with complication [facility])   [place] [facility] and Sports Medicine at [facility], Jesse Sans, MD   1 year ago Annual physical exam   [facility] and [facility] at [facility] Jesse Sans, MD       Future Appointments             In 1 month Army Melia Jesse Sans, MD [facility] Health Primary Care and Sports Medicine at [facility], [facility]   In 10 months Army Melia Jesse Sans, MD [facility] Health Primary Care and Sports Medicine at [facility], [facility]            Signed Prescriptions Disp Refills   DULoxetine (CYMBALTA) 60 MG capsule 90 capsule 0    Sig: TAKE 1 CAPSULE BY MOUTH EVERY DAY     Psychiatry: Antidepressants - SNRI - duloxetine Passed - 09/30/2021  6:05 PM      Passed - Cr in normal range and within 360 days    Creatinine  Date Value Ref Range Status  07/08/2013 0.92 0.60 - 1.30 mg/dL  Final   Creatinine, Ser  Date Value Ref Range Status  07/26/2021 0.90 0.57 - 1.00 mg/dL Final         Passed - eGFR is 30 or above and within 360 days    EGFR (African American)  Date Value Ref Range Status  07/08/2013 >60  Final   GFR calc Af Amer  Date Value Ref Range Status  07/21/2019 89 >59 mL/min/1.73 Final    Comment:    **[facility] currently reports eGFR in compliance with the current**   recommendations of the [employer]. [facility] will   update reporting as new guidelines are published from the NKF-ASN   Task force.    EGFR (Non-African Amer.)  Date Value Ref Range Status  07/08/2013 >60  Final    Comment:    eGFR values <67m/min/1.73 m2 may be an indication of chronic kidney disease (CKD). Calculated eGFR is useful in patients with stable renal function. The eGFR calculation will not be reliable in acutely ill patients when serum creatinine is changing rapidly. It is not useful in  patients on dialysis. The eGFR calculation may not be applicable to patients at the low  and high extremes of body sizes, pregnant women, and vegetarians.    GFR, Estimated  Date Value Ref Range Status  05/06/2021 51 (L) >60 mL/min Final    Comment:    (NOTE) Calculated using the CKD-EPI Creatinine Equation (2021)    eGFR  Date Value Ref Range Status  07/26/2021 72 >59 mL/min/1.73 Final         Passed - Completed PHQ-2 or PHQ-9 in the last 360 days      Passed - Last BP in normal range    BP Readings from Last 1 Encounters:  07/26/21 128/70         Passed - Valid encounter within last 6 months    Recent Outpatient Visits           2 months ago Annual physical exam   [place] [facility] at [facility], Jesse Sans, MD   4 months ago Diarrhea, unspecified type   [facility] and [facility] at [facility], Jesse Sans, MD   6 months ago Essential hypertension   [place] [facility] and  [facility] Medicine at [facility], Jesse Sans, MD   10 months ago Type II diabetes mellitus with complication [facility])   [place] [facility] and Sports Medicine at [facility], Jesse Sans, MD   1 year ago Annual physical exam   [facility] Primary Care and Sports Medicine at [facility], Jesse Sans, MD       Future Appointments             In 1 month Army Melia Jesse Sans, MD [facility] Health Primary Care and Sports [facility] at [facility]   In 10 months Glean Hess, MD [facility] Health Primary Care and Sports Medicine at [place], [facility]             amLODipine (NORVASC) 5 MG tablet 90 tablet 0    Sig: TAKE 1 TABLET (5 MG TOTAL) BY MOUTH DAILY.     Cardiovascular: Calcium Channel Blockers 2 Passed - 09/30/2021  6:05 PM      Passed - Last BP in normal range    BP Readings from Last 1 Encounters:  07/26/21 128/70         Passed - Last Heart Rate in normal range    Pulse Readings from Last 1 Encounters:  07/26/21 72         Passed - Valid encounter within last 6 months    Recent Outpatient Visits           2 months ago Annual physical exam   [place] [facility] Medicine at [facility], Jesse Sans, MD   4 months ago Diarrhea, unspecified type   [facility] Health Primary Care and Sports Medicine at [facility], Jesse Sans, MD   6 months ago Essential hypertension   [place] Primary Care and Sports Medicine at [facility], Jesse Sans, MD   10 months ago Type II diabetes mellitus with complication [facility])   [place] [facility] at [facility], Jesse Sans, MD   1 year ago Annual physical exam   [facility] Primary [facility] and Sports [facility] at [facility], Jesse Sans, MD       Future Appointments             In 1 month Army Melia, Jesse Sans, MD [place] and Sports  Medicine at [facility], [place]   In 10 months Glean Hess, MD  [facility] Health Primary Care and Sports Medicine at [facility], [facility]            Refused Prescriptions Disp Refills   losartan (COZAAR) 100 MG tablet [Pharmacy Med Name: LOSARTAN POTASSIUM 100 MG TAB] 90 tablet     Sig: TAKE 1 TABLET BY MOUTH EVERY DAY     Cardiovascular:  Angiotensin Receptor Blockers Failed - 09/30/2021  6:05 PM      Failed - K in normal range and within 180 days    Potassium  Date Value Ref Range Status  07/26/2021 5.6 (H) 3.5 - 5.2 mmol/L Final  07/08/2013 3.5 3.5 - 5.1 mmol/L Final         Passed - Cr in normal range and within 180 days    Creatinine  Date Value Ref Range Status  07/08/2013 0.92 0.60 - 1.30 mg/dL Final   Creatinine, Ser  Date Value Ref Range Status  07/26/2021 0.90 0.57 - 1.00 mg/dL Final         Passed - Patient is not pregnant      Passed - Last BP in normal range    BP Readings from Last 1 Encounters:  07/26/21 128/70         Passed - Valid encounter within last 6 months    Recent Outpatient Visits           2 months ago Annual physical exam   [place] [facility] at [facility], Jesse Sans, MD   4 months ago Diarrhea, unspecified type   [place] [facility] and Sports Medicine at [facility], Jesse Sans, MD   6 months ago Essential hypertension   [place] [facility] Care and Sports Medicine at [facility], Jesse Sans, MD   10 months ago Type II diabetes mellitus with complication [facility])   [place] and Sports Medicine at [facility], Jesse Sans, MD   1 year ago Annual physical exam   [facility] Primary Care and Sports Medicine at [facility], Jesse Sans, MD       Future Appointments             In 1 month Glean Hess, MD [facility] Health Primary Care and Sports Medicine at [facility], [facility]   In 10 months Glean Hess, MD [facility] Health Primary Care and Sports Medicine at [place], [facility]              metFORMIN (GLUCOPHAGE-XR) 500 MG 24 hr tablet [Pharmacy Med Name: METFORMIN HCL ER 500 MG TABLET] 180 tablet     Sig: TAKE 1 TABLET BY [place] A DAY     Endocrinology:  Diabetes - Biguanides Failed - 09/30/2021  6:05 PM      Failed - B12 Level in normal range and within 720 days    No results found for: "VITAMINB12"       Passed - Cr in normal range and within 360 days    Creatinine  Date Value Ref Range Status  07/08/2013 0.92 0.60 - 1.30 mg/dL Final   Creatinine, Ser  Date Value Ref Range Status  07/26/2021 0.90 0.57 - 1.00 mg/dL Final         Passed - HBA1C is between 0 and 7.9 and within 180 days    HB A1C (BAYER DCA - WAIVED)  Date Value Ref Range Status  07/28/2014 6.9 <7.0 % Final    Comment:                                          Diabetic Adult            <7.0                                       Healthy Adult        4.3 - 5.7                                                           (DCCT/NGSP) American Diabetes Association's Summary of Glycemic Recommendations for Adults with Diabetes: Hemoglobin A1c <7.0%. More stringent glycemic goals (A1c <6.0%) may further reduce complications at the cost of increased risk of hypoglycemia.    Hgb A1c MFr Bld  Date Value Ref Range Status  07/26/2021 6.4 (H) 4.8 - 5.6 % Final    Comment:             Prediabetes: 5.7 - 6.4          Diabetes: >6.4          Glycemic control for adults with diabetes: <7.0          Passed - eGFR in normal range and within 360 days    EGFR (African American)  Date Value Ref Range Status  07/08/2013 >60  Final   GFR calc Af Amer  Date Value Ref Range Status  07/21/2019 89 >59 mL/min/1.73 Final    Comment:    **[facility] currently reports eGFR in compliance with the current**   recommendations of the [employer]. [facility] will   update reporting as new guidelines are published from the NKF-ASN   Task force.    EGFR (Non-African Amer.)  Date Value Ref Range Status   07/08/2013 >60  Final    Comment:    eGFR values <31m/min/1.73 m2 may be an indication of chronic kidney disease (CKD). Calculated eGFR is useful in patients with stable renal function. The eGFR calculation will not be reliable in acutely ill patients when serum creatinine is changing rapidly. It is not useful in  patients on dialysis. The eGFR calculation may not be applicable to patients at the low and high extremes of body sizes, pregnant women, and vegetarians.    GFR, Estimated  Date Value Ref Range Status  05/06/2021 51 (L) >60 mL/min Final    Comment:    (NOTE) Calculated using the CKD-EPI Creatinine Equation (2021)    eGFR  Date Value Ref Range Status  07/26/2021 72 >59 mL/min/1.73 Final         Passed - Valid encounter within last 6 months    Recent Outpatient Visits           2 months ago Annual physical exam   [place] [facility]   4 months ago Diarrhea, unspecified type   [facility]   6 months ago Essential hypertension  [place] [facility] at [facility], Jesse Sans, MD   10 months ago Type II diabetes mellitus with complication [facility])   [place] [facility] Care and Sports [facility] at [facility], Jesse Sans, MD   1 year ago Annual physical exam   [place] [facility] Care and Sports Medicine at [facility], Jesse Sans, MD       Future Appointments             In 1 month Army Melia, Jesse Sans, MD [facility] Health Primary Care and Sports Medicine at [facility], [facility]   In 10 months Glean Hess, MD [facility] Primary Care and Sports Medicine at [facility], [place] within normal limits and completed in the last 12 months    WBC  Date Value Ref Range Status  07/26/2021 7.5 3.4 - 10.8 x10E3/uL Final  05/06/2021 9.6 4.0 - 10.5  K/uL Final   RBC  Date Value Ref Range Status  07/26/2021 4.59 3.77 - 5.28 x10E6/uL Final  05/06/2021 4.97 3.87 - 5.11 MIL/uL Final   Hemoglobin  Date Value Ref Range Status  07/26/2021 13.6 11.1 - 15.9 g/dL Final   Hematocrit  Date Value Ref Range Status  07/26/2021 40.0 34.0 - 46.6 % Final   MCHC  Date Value Ref Range Status  07/26/2021 34.0 31.5 - 35.7 g/dL Final  05/06/2021 33.0 30.0 - 36.0 g/dL Final   [facility]  Date Value Ref Range Status  07/26/2021 29.6 26.6 - 33.0 pg Final  05/06/2021 29.0 26.0 - 34.0 pg Final   MCV  Date Value Ref Range Status  07/26/2021 87 79 - 97 fL Final  09/28/2013 85 80 - 100 fL Final   No results found for: "PLTCOUNTKUC", "LABPLAT", "POCPLA" RDW  Date Value Ref Range Status  07/26/2021 13.0 11.7 - 15.4 % Final  09/28/2013 12.9 11.5 - 14.5 % Final          TRULICITY 2.56 LS/9.3TD SOPN [Pharmacy Med Name: TRULICITY 4.28 JG/8.1 ML PEN]      Sig: INJECT 0.75 MG SUBCUTANEOUSLY ONE TIME PER WEEK     Endocrinology:  Diabetes - GLP-1 Receptor Agonists Passed - 09/30/2021  6:05 PM      Passed - HBA1C is between 0 and 7.9 and within 180 days    HB A1C (BAYER DCA - WAIVED)  Date Value Ref Range Status  07/28/2014 6.9 <7.0 % Final    Comment:                                          Diabetic Adult            <7.0                                       Healthy Adult        4.3 - 5.7                                                           (DCCT/NGSP) American  Diabetes Association's Summary of Glycemic Recommendations for Adults with Diabetes: Hemoglobin A1c <7.0%. More stringent glycemic goals (A1c <6.0%) may further reduce complications at the cost of increased risk of hypoglycemia.    Hgb A1c MFr Bld  Date Value Ref Range Status  07/26/2021 6.4 (H) 4.8 - 5.6 % Final    Comment:             Prediabetes: 5.7 - 6.4          Diabetes: >6.4          Glycemic control for adults with diabetes: <7.0          Passed - Valid encounter within  last 6 months    Recent Outpatient Visits           2 months ago Annual physical exam   [place] [facility], Jesse Sans, MD   4 months ago Diarrhea, unspecified type   [facility] Primary Care and Sports Medicine at [facility], Jesse Sans, MD   6 months ago Essential hypertension   [place] Primary Care and Sports Medicine at [facility], Jesse Sans, MD   10 months ago Type II diabetes mellitus with complication [facility])   [place] Primary Care and Sports Medicine at [facility], Jesse Sans, MD   1 year ago Annual physical exam   [place] Primary Care and Sports Medicine at [facility], Jesse Sans, MD       Future Appointments             In 1 month Army Melia Jesse Sans, MD [facility] Health Primary Care and Sports Medicine at [facility], [facility]   In 10 months Glean Hess, MD [facility] Health Primary Care and Sports Medicine at [facility], [facility]             metoprolol succinate (TOPROL-XL) 100 MG 24 hr tablet [Pharmacy Med Name: METOPROLOL SUCC ER 100 MG TAB] 90 tablet     Sig: TAKE 0.5 TABLETS (50 MG TOTAL) BY MOUTH IN THE MORNING AND AT BEDTIME.     Cardiovascular:  Beta Blockers Passed - 09/30/2021  6:05 PM      Passed - Last BP in normal range    BP Readings from Last 1 Encounters:  07/26/21 128/70         Passed - Last Heart Rate in normal range    Pulse Readings from Last 1 Encounters:  07/26/21 72         Passed - Valid encounter within last 6 months    Recent Outpatient Visits           2 months ago Annual physical exam   [place] [facility] and Sports Medicine at [facility], Jesse Sans, MD   4 months ago Diarrhea, unspecified type   [facility] Primary Care and Sports Medicine at [facility], Jesse Sans, MD   6 months ago Essential hypertension   [place] Primary Care and Sports Medicine at [facility], Jesse Sans,  MD   10 months ago Type II diabetes mellitus with complication [facility])   [place] Primary Care and Sports Medicine at [facility], Jesse Sans, MD   1 year ago Annual physical exam   [facility] Primary Care and Sports Medicine at [facility], Jesse Sans, MD       Future Appointments  In 1 month Army Melia, Jesse Sans, MD [facility] Health Primary Care and Sports Medicine at [facility], [facility]   In 10 months Army Melia, Jesse Sans, MD [facility] Primary Care and Sports Medicine at [facility], [facility]

## 2021-11-18 ENCOUNTER — Other Ambulatory Visit: Payer: Self-pay | Admitting: Internal Medicine

## 2021-11-18 DIAGNOSIS — E118 Type 2 diabetes mellitus with unspecified complications: Secondary | ICD-10-CM

## 2021-11-18 DIAGNOSIS — I10 Essential (primary) hypertension: Secondary | ICD-10-CM

## 2021-11-20 NOTE — Telephone Encounter (Signed)
Metformin d/c'd 05/20/2021 not on med list. Requested Prescriptions  Pending Prescriptions Disp Refills  . metoprolol succinate (TOPROL-XL) 100 MG 24 hr tablet [Pharmacy Med Name: METOPROLOL SUCC ER 100 MG TAB] 90 tablet 0    Sig: TAKE 0.5 TABLETS (50 MG TOTAL) BY MOUTH IN THE MORNING AND AT BEDTIME.     Cardiovascular:  Beta Blockers Passed - 11/18/2021  9:56 AM      Passed - Last BP in normal range    BP Readings from Last 1 Encounters:  07/26/21 128/70         Passed - Last Heart Rate in normal range    Pulse Readings from Last 1 Encounters:  07/26/21 72         Passed - Valid encounter within last 6 months    Recent Outpatient Visits          3 months ago Annual physical exam   [place] [facility] at [facility], Jesse Sans, MD   6 months ago Diarrhea, unspecified type   [facility] at [facility], Jesse Sans, MD   7 months ago Essential hypertension   [place] [facility] at [facility], Jesse Sans, MD   11 months ago Type II diabetes mellitus with complication [facility])   [place] [facility] at [facility], Jesse Sans, MD   1 year ago Annual physical exam   [facility] at [facility], Jesse Sans, MD      Future Appointments            In 1 week Army Melia, Jesse Sans, MD [facility] at [facility], [facility]   In 8 months Army Melia, Jesse Sans, MD [facility] Primary Care and [facility] Medicine at [facility], [facility]           . DULoxetine (CYMBALTA) 60 MG capsule [Pharmacy Med Name: DULOXETINE HCL DR 60 MG CAP] 90 capsule 0    Sig: TAKE 1 CAPSULE BY MOUTH EVERY DAY     Psychiatry: Antidepressants - SNRI - duloxetine Passed - 11/18/2021  9:56 AM      Passed - Cr in normal range and within 360 days    Creatinine  Date Value Ref Range Status  07/08/2013 0.92  0.60 - 1.30 mg/dL Final   Creatinine, Ser  Date Value Ref Range Status  07/26/2021 0.90 0.57 - 1.00 mg/dL Final         Passed - eGFR is 30 or above and within 360 days    EGFR (African American)  Date Value Ref Range Status  07/08/2013 >60  Final   GFR calc Af Amer  Date Value Ref Range Status  07/21/2019 89 >59 mL/min/1.73 Final    Comment:    **[facility] currently reports eGFR in compliance with the current**   recommendations of the [employer]. [facility] will   update reporting as new guidelines are published from the NKF-ASN   Task force.    EGFR (Non-African Amer.)  Date Value Ref Range Status  07/08/2013 >60  Final    Comment:    eGFR values <29m/min/1.73 m2 may be an indication of chronic kidney disease (CKD). Calculated eGFR is useful in patients with stable renal function. The eGFR calculation will not be reliable in acutely ill patients when serum creatinine is changing rapidly. It is not useful in  patients on dialysis. The eGFR calculation may not be applicable to patients at the low and high extremes of body sizes, pregnant women, and vegetarians.    GFR, Estimated  Date Value Ref Range Status  05/06/2021 51 (L) >60 mL/min Final    Comment:    (NOTE) Calculated using the CKD-EPI Creatinine Equation (2021)    eGFR  Date Value Ref Range Status  07/26/2021 72 >59 mL/min/1.73 Final         Passed - Completed PHQ-2 or PHQ-9 in the last 360 days      Passed - Last BP in normal range    BP Readings from Last 1 Encounters:  07/26/21 128/70         Passed - Valid encounter within last 6 months    Recent Outpatient Visits          3 months ago Annual physical exam   [place] [facility] at [facility], Jesse Sans, MD   6 months ago Diarrhea, unspecified type   [facility] at [facility], Jesse Sans, MD   7 months ago Essential hypertension   [place]  Primary Care and Sports Medicine at [facility], Jesse Sans, MD   11 months ago Type II diabetes mellitus with complication [facility])   [place] [facility] Medicine at [facility], Jesse Sans, MD   1 year ago Annual physical exam   [place] [facility] at [facility], Jesse Sans, MD      Future Appointments            In 1 week Army Melia Jesse Sans, MD [facility] Primary Care and Sports Medicine at [facility], [facility]   In 8 months Army Melia, Jesse Sans, MD [facility] Primary Care and Sports [facility] at [facility], [place]           . amLODipine (NORVASC) 5 MG tablet [Pharmacy Med Name: AMLODIPINE BESYLATE 5 MG TAB] 90 tablet 0    Sig: TAKE 1 TABLET (5 MG TOTAL) BY MOUTH DAILY.     Cardiovascular: Calcium Channel Blockers 2 Passed - 11/18/2021  9:56 AM      Passed - Last BP in normal range    BP Readings from Last 1 Encounters:  07/26/21 128/70         Passed - Last Heart Rate in normal range    Pulse Readings from Last 1 Encounters:  07/26/21 72         Passed - Valid encounter within last 6 months    Recent Outpatient Visits          3 months ago Annual physical exam   [place] [facility] Medicine at [facility], Jesse Sans, MD   6 months ago Diarrhea, unspecified type   [facility] and [facility] at [facility], Jesse Sans, MD   7 months ago Essential hypertension    Primary Care and Sports Medicine at [facility], Jesse Sans, MD   11 months ago Type II diabetes mellitus with complication [facility])   [place] Primary Care and Sports Medicine at [facility], Jesse Sans, MD   1 year ago Annual physical exam   [facility] Primary Care and Sports Medicine at [facility], Jesse Sans, MD      Future Appointments  In 1 week Army Melia, Jesse Sans, MD [facility] Primary Care and Sports Medicine at  [facility], [facility]   In 8 months Army Melia, Jesse Sans, MD [facility] Primary Care and Sports Medicine at [facility], [facility]           . TRULICITY 8.29 FA/2.1HY SOPN [Pharmacy Med Name: TRULICITY 8.65 HQ/4.6 ML PEN] 6 mL 0    Sig: INJECT 0.75 MG SUBCUTANEOUSLY ONE TIME PER WEEK     Endocrinology:  Diabetes - GLP-1 Receptor Agonists Passed - 11/18/2021  9:56 AM      Passed - HBA1C is between 0 and 7.9 and within 180 days    HB A1C (BAYER DCA - WAIVED)  Date Value Ref Range Status  07/28/2014 6.9 <7.0 % Final    Comment:                                          Diabetic Adult            <7.0                                       Healthy Adult        4.3 - 5.7                                                           (DCCT/NGSP) American Diabetes Association's Summary of Glycemic Recommendations for Adults with Diabetes: Hemoglobin A1c <7.0%. More stringent glycemic goals (A1c <6.0%) may further reduce complications at the cost of increased risk of hypoglycemia.    Hgb A1c MFr Bld  Date Value Ref Range Status  07/26/2021 6.4 (H) 4.8 - 5.6 % Final    Comment:             Prediabetes: 5.7 - 6.4          Diabetes: >6.4          Glycemic control for adults with diabetes: <7.0          Passed - Valid encounter within last 6 months    Recent Outpatient Visits          3 months ago Annual physical exam   [place] [facility] Medicine at [facility], Jesse Sans, MD   6 months ago Diarrhea, unspecified type   [facility] and [facility] at [facility], Jesse Sans, MD   7 months ago Essential hypertension   [place] [facility] Care and Sports Medicine at [facility], Jesse Sans, MD   11 months ago Type II diabetes mellitus with complication [facility])   [place] [facility] and Sports Medicine at [facility], Jesse Sans, MD   1 year ago Annual physical exam   [facility] Primary Care and Sports Medicine at  [facility], Jesse Sans, MD      Future Appointments            In 1 week Army Melia, Jesse Sans, MD [facility] Primary [facility] at [facility], [facility]   In 8 months Glean Hess, MD  [place] Primary Care and Sports Medicine at [facility], [place]           . metFORMIN (GLUCOPHAGE-XR) 500 MG 24 hr tablet [Pharmacy Med Name: METFORMIN HCL ER 500 MG TABLET] 180 tablet 1    Sig: TAKE 1 [place] A DAY     Endocrinology:  Diabetes - Biguanides Failed - 11/18/2021  9:56 AM      Failed - B12 Level in normal range and within 720 days    No results found for: "VITAMINB12"       Passed - Cr in normal range and within 360 days    Creatinine  Date Value Ref Range Status  07/08/2013 0.92 0.60 - 1.30 mg/dL Final   Creatinine, Ser  Date Value Ref Range Status  07/26/2021 0.90 0.57 - 1.00 mg/dL Final         Passed - HBA1C is between 0 and 7.9 and within 180 days    HB A1C (BAYER DCA - WAIVED)  Date Value Ref Range Status  07/28/2014 6.9 <7.0 % Final    Comment:                                          Diabetic Adult            <7.0                                       Healthy Adult        4.3 - 5.7                                                           (DCCT/NGSP) American Diabetes Association's Summary of Glycemic Recommendations for Adults with Diabetes: Hemoglobin A1c <7.0%. More stringent glycemic goals (A1c <6.0%) may further reduce complications at the cost of increased risk of hypoglycemia.    Hgb A1c MFr Bld  Date Value Ref Range Status  07/26/2021 6.4 (H) 4.8 - 5.6 % Final    Comment:             Prediabetes: 5.7 - 6.4          Diabetes: >6.4          Glycemic control for adults with diabetes: <7.0          Passed - eGFR in normal range and within 360 days    EGFR (African American)  Date Value Ref Range Status  07/08/2013 >60  Final   GFR calc Af Amer  Date Value Ref Range Status  07/21/2019 89 >59 mL/min/1.73  Final    Comment:    **[facility] currently reports eGFR in compliance with the current**   recommendations of the [employer]. [facility] will   update reporting as new guidelines are published from the NKF-ASN   Task force.    EGFR (Non-African Amer.)  Date Value Ref Range Status  07/08/2013 >60  Final    Comment:    eGFR values <57m/min/1.73 m2 may be an indication of chronic kidney disease (CKD). Calculated eGFR is useful in patients with stable renal function. The eGFR  calculation will not be reliable in acutely ill patients when serum creatinine is changing rapidly. It is not useful in  patients on dialysis. The eGFR calculation may not be applicable to patients at the low and high extremes of body sizes, pregnant women, and vegetarians.    GFR, Estimated  Date Value Ref Range Status  05/06/2021 51 (L) >60 mL/min Final    Comment:    (NOTE) Calculated using the CKD-EPI Creatinine Equation (2021)    eGFR  Date Value Ref Range Status  07/26/2021 72 >59 mL/min/1.73 Final         Passed - Valid encounter within last 6 months    Recent Outpatient Visits          3 months ago Annual physical exam   [place] [facility] at [facility], Jesse Sans, MD   6 months ago Diarrhea, unspecified type   [place] [facility] Medicine at [facility], Jesse Sans, MD   7 months ago Essential hypertension   [place] [facility] Medicine at [facility], Jesse Sans, MD   11 months ago Type II diabetes mellitus with complication [facility])   [place] [facility] Care and Sports Medicine at [facility], Jesse Sans, MD   1 year ago Annual physical exam   [place] [facility] Medicine at [facility], Jesse Sans, MD      Future Appointments            In 1 week Army Melia Jesse Sans, MD [facility] Primary Care and Sports Medicine at [facility], [facility]   In 8  months Glean Hess, MD [facility] Primary Care and Sports Medicine at [facility], [place] within normal limits and completed in the last 12 months    WBC  Date Value Ref Range Status  07/26/2021 7.5 3.4 - 10.8 x10E3/uL Final  05/06/2021 9.6 4.0 - 10.5 K/uL Final   RBC  Date Value Ref Range Status  07/26/2021 4.59 3.77 - 5.28 x10E6/uL Final  05/06/2021 4.97 3.87 - 5.11 MIL/uL Final   Hemoglobin  Date Value Ref Range Status  07/26/2021 13.6 11.1 - 15.9 g/dL Final   Hematocrit  Date Value Ref Range Status  07/26/2021 40.0 34.0 - 46.6 % Final   MCHC  Date Value Ref Range Status  07/26/2021 34.0 31.5 - 35.7 g/dL Final  05/06/2021 33.0 30.0 - 36.0 g/dL Final   [facility]  Date Value Ref Range Status  07/26/2021 29.6 26.6 - 33.0 pg Final  05/06/2021 29.0 26.0 - 34.0 pg Final   MCV  Date Value Ref Range Status  07/26/2021 87 79 - 97 fL Final  09/28/2013 85 80 - 100 fL Final   No results found for: "PLTCOUNTKUC", "LABPLAT", "POCPLA" RDW  Date Value Ref Range Status  07/26/2021 13.0 11.7 - 15.4 % Final  09/28/2013 12.9 11.5 - 14.5 % Final         . losartan (COZAAR) 100 MG tablet [Pharmacy Med Name: LOSARTAN POTASSIUM 100 MG TAB] 90 tablet 0    Sig: TAKE 1 TABLET BY MOUTH EVERY DAY     Cardiovascular:  Angiotensin Receptor Blockers Failed - 11/18/2021  9:56 AM      Failed - K in normal range and within 180 days    Potassium  Date Value Ref Range Status  07/26/2021 5.6 (H) 3.5 -  5.2 mmol/L Final  07/08/2013 3.5 3.5 - 5.1 mmol/L Final         Passed - Cr in normal range and within 180 days    Creatinine  Date Value Ref Range Status  07/08/2013 0.92 0.60 - 1.30 mg/dL Final   Creatinine, Ser  Date Value Ref Range Status  07/26/2021 0.90 0.57 - 1.00 mg/dL Final         Passed - Patient is not pregnant      Passed - Last BP in normal range    BP Readings from Last 1 Encounters:  07/26/21 128/70         Passed - Valid encounter  within last 6 months    Recent Outpatient Visits          3 months ago Annual physical exam   [place] [facility] Medicine at [facility], Jesse Sans, MD   6 months ago Diarrhea, unspecified type   [facility] Primary Care and Sports Medicine at [facility], Jesse Sans, MD   7 months ago Essential hypertension   [place] [facility] Care [facility] at [facility], Jesse Sans, MD   11 months ago Type II diabetes mellitus with complication [facility])   [place] [facility] Care and Sports Medicine at [facility], Jesse Sans, MD   1 year ago Annual physical exam   [facility] Primary Care and Sports Medicine at [facility], Jesse Sans, MD      Future Appointments            In 1 week Army Melia, Jesse Sans, MD [facility] Primary Care and Sports Medicine at [facility], [facility]   In 8 months Army Melia, Jesse Sans, MD [place] Primary Care and Sports Medicine at [facility], [facility]

## 2021-11-20 NOTE — Telephone Encounter (Signed)
Requested Prescriptions  Pending Prescriptions Disp Refills  . metoprolol succinate (TOPROL-XL) 100 MG 24 hr tablet [Pharmacy Med Name: METOPROLOL SUCC ER 100 MG TAB] 90 tablet 0    Sig: TAKE 0.5 TABLETS (50 MG TOTAL) BY MOUTH IN THE MORNING AND AT BEDTIME.     Cardiovascular:  Beta Blockers Passed - 11/18/2021  9:56 AM      Passed - Last BP in normal range    BP Readings from Last 1 Encounters:  07/26/21 128/70         Passed - Last Heart Rate in normal range    Pulse Readings from Last 1 Encounters:  07/26/21 72         Passed - Valid encounter within last 6 months    Recent Outpatient Visits          3 months ago Annual physical exam   [place] [facility] at [facility], Jesse Sans, MD   6 months ago Diarrhea, unspecified type   [facility] at [facility], Jesse Sans, MD   7 months ago Essential hypertension   [place] [facility] at [facility], Jesse Sans, MD   11 months ago Type II diabetes mellitus with complication [facility])   [place] [facility] at [facility], Jesse Sans, MD   1 year ago Annual physical exam   [place] [facility] at [facility], Jesse Sans, MD      Future Appointments            In 1 week Army Melia Jesse Sans, MD [facility] and [facility] at [facility], [facility]   In 8 months Glean Hess, MD [facility] at [facility], [facility]           Signed Prescriptions Disp Refills   DULoxetine (CYMBALTA) 60 MG capsule 90 capsule 0    Sig: TAKE 1 [place]     Psychiatry: Antidepressants - SNRI - duloxetine Passed - 11/18/2021  9:56 AM      Passed - Cr in normal range and within 360 days    Creatinine  Date Value Ref Range Status  07/08/2013 0.92 0.60 - 1.30 mg/dL Final   Creatinine, Ser  Date Value  Ref Range Status  07/26/2021 0.90 0.57 - 1.00 mg/dL Final         Passed - eGFR is 30 or above and within 360 days    EGFR (African American)  Date Value Ref Range Status  07/08/2013 >60  Final   GFR calc Af Amer  Date Value Ref Range Status  07/21/2019 89 >59 mL/min/1.73 Final    Comment:    **[facility] currently reports eGFR in compliance with the current**   recommendations of the [employer]. [facility] will   update reporting as new guidelines are published from the NKF-ASN   Task force.    EGFR (Non-African Amer.)  Date Value Ref Range Status  07/08/2013 >60  Final    Comment:    eGFR values <63m/min/1.73 m2 may be an indication of chronic kidney disease (CKD). Calculated eGFR is useful in patients with stable renal function. The eGFR calculation will not be reliable in acutely ill patients when serum creatinine is changing rapidly. It is not useful in  patients on dialysis. The eGFR calculation may not be applicable  to patients at the low and high extremes of body sizes, pregnant women, and vegetarians.    GFR, Estimated  Date Value Ref Range Status  05/06/2021 51 (L) >60 mL/min Final    Comment:    (NOTE) Calculated using the CKD-EPI Creatinine Equation (2021)    eGFR  Date Value Ref Range Status  07/26/2021 72 >59 mL/min/1.73 Final         Passed - Completed PHQ-2 or PHQ-9 in the last 360 days      Passed - Last BP in normal range    BP Readings from Last 1 Encounters:  07/26/21 128/70         Passed - Valid encounter within last 6 months    Recent Outpatient Visits          3 months ago Annual physical exam   [place] [facility] at [facility], Jesse Sans, MD   6 months ago Diarrhea, unspecified type   [facility] at [facility], Jesse Sans, MD   7 months ago Essential hypertension   [place] [facility] at [facility],  Jesse Sans, MD   11 months ago Type II diabetes mellitus with complication [facility])   [place] [facility] at [facility], Jesse Sans, MD   1 year ago Annual physical exam   [place] [facility] at [facility], Jesse Sans, MD      Future Appointments            In 1 week Army Melia Jesse Sans, MD [facility] Primary Care and Sports Medicine at [place] [facility], [facility]   In 8 months Glean Hess, MD [facility] Primary Care and Sports Medicine at [place], [facility]            amLODipine (NORVASC) 5 MG tablet 90 tablet 0    Sig: TAKE 1 TABLET (5 MG TOTAL) BY MOUTH DAILY.     Cardiovascular: Calcium Channel Blockers 2 Passed - 11/18/2021  9:56 AM      Passed - Last BP in normal range    BP Readings from Last 1 Encounters:  07/26/21 128/70         Passed - Last Heart Rate in normal range    Pulse Readings from Last 1 Encounters:  07/26/21 72         Passed - Valid encounter within last 6 months    Recent Outpatient Visits          3 months ago Annual physical exam   [place] [facility] at [facility], Jesse Sans, MD   6 months ago Diarrhea, unspecified type   [facility], [facility], MD   7 months ago Essential hypertension   [employer] [facility] and [facility] at [facility], Jesse Sans, MD   11 months ago Type II diabetes mellitus with complication [facility])   [place] [facility] at [facility], Jesse Sans, MD   1 year ago Annual physical exam   [facility] Primary [facility] at [facility], Jesse Sans, MD      Future Appointments            In 1 week Army Melia, Jesse Sans, MD [facility] Primary Care and Sports Medicine at  [facility] [place]   In 8 months Glean Hess, MD [facility] Primary Care and Sports Medicine at [place], [facility]            TRULICITY 7.34 LP/3.7TK SOPN 6 mL 0    Sig: INJECT 0.75 MG SUBCUTANEOUSLY ONE TIME PER WEEK     Endocrinology:  Diabetes - GLP-1 Receptor Agonists Passed - 11/18/2021  9:56 AM      Passed - HBA1C is between 0 and 7.9 and within 180 days    HB A1C (BAYER DCA - WAIVED)  Date Value Ref Range Status  07/28/2014 6.9 <7.0 % Final    Comment:                                          Diabetic Adult            <7.0                                       Healthy Adult        4.3 - 5.7                                                           (DCCT/NGSP) American Diabetes Association's Summary of Glycemic Recommendations for Adults with Diabetes: Hemoglobin A1c <7.0%. More stringent glycemic goals (A1c <6.0%) may further reduce complications at the cost of increased risk of hypoglycemia.    Hgb A1c MFr Bld  Date Value Ref Range Status  07/26/2021 6.4 (H) 4.8 - 5.6 % Final    Comment:             Prediabetes: 5.7 - 6.4          Diabetes: >6.4          Glycemic control for adults with diabetes: <7.0          Passed - Valid encounter within last 6 months    Recent Outpatient Visits          3 months ago Annual physical exam   [place] [facility] at [facility], Jesse Sans, MD   6 months ago Diarrhea, unspecified type   [facility] at [facility], Jesse Sans, MD   7 months ago Essential hypertension   [place] [facility] at [facility], Jesse Sans, MD   11 months ago Type II diabetes mellitus with complication [facility])   [place] [facility] at [facility], Jesse Sans, MD   1 year ago Annual physical exam   [facility] Primary [facility] at [facility], Jesse Sans, MD      Future Appointments            In 1 week Army Melia, Jesse Sans, MD [facility] Primary Care and Sports Medicine at  [facility], [facility]   In 8 months Army Melia, Jesse Sans, MD [facility] Primary Care and Sports Medicine at [facility], [facility]            losartan (  COZAAR) 100 MG tablet 90 tablet 0    Sig: TAKE 1 TABLET BY MOUTH EVERY DAY     Cardiovascular:  Angiotensin Receptor Blockers Failed - 11/18/2021  9:56 AM      Failed - K in normal range and within 180 days    Potassium  Date Value Ref Range Status  07/26/2021 5.6 (H) 3.5 - 5.2 mmol/L Final  07/08/2013 3.5 3.5 - 5.1 mmol/L Final         Passed - Cr in normal range and within 180 days    Creatinine  Date Value Ref Range Status  07/08/2013 0.92 0.60 - 1.30 mg/dL Final   Creatinine, Ser  Date Value Ref Range Status  07/26/2021 0.90 0.57 - 1.00 mg/dL Final         Passed - Patient is not pregnant      Passed - Last BP in normal range    BP Readings from Last 1 Encounters:  07/26/21 128/70         Passed - Valid encounter within last 6 months    Recent Outpatient Visits          3 months ago Annual physical exam   [place] [facility] at [facility], Jesse Sans, MD   6 months ago Diarrhea, unspecified type   [place] [facility] Medicine at [facility], Jesse Sans, MD   7 months ago Essential hypertension   [place] [facility] and [facility] Medicine at [facility], Jesse Sans, MD   11 months ago Type II diabetes mellitus with complication [facility])   [place] [facility] at [facility], Jesse Sans, MD   1 year ago Annual physical exam   [facility] Primary [facility] and [facility] at [facility], Jesse Sans, MD      Future Appointments            In 1 week Army Melia, Jesse Sans, MD [facility] Primary Care and Sports Medicine at [facility], [facility]   In 8 months Army Melia, Jesse Sans, MD [facility] Primary Care [facility] at [facility], [place]  .  metFORMIN (GLUCOPHAGE-XR) 500 MG 24 hr tablet [Pharmacy Med Name: METFORMIN HCL ER 500 MG TABLET] 180 tablet 1    Sig: TAKE 1 TABLET BY MOUTH TWICE A DAY     Endocrinology:  Diabetes - Biguanides Failed - 11/18/2021  9:56 AM      Failed - B12 Level in normal range and within 720 days    No results found for: "VITAMINB12"       Passed - Cr in normal range and within 360 days    Creatinine  Date Value Ref Range Status  07/08/2013 0.92 0.60 - 1.30 mg/dL Final   Creatinine, Ser  Date Value Ref Range Status  07/26/2021 0.90 0.57 - 1.00 mg/dL Final         Passed - HBA1C is between 0 and 7.9 and within 180 days    HB A1C (BAYER DCA - WAIVED)  Date Value Ref Range Status  07/28/2014 6.9 <7.0 % Final    Comment:  Diabetic Adult            <7.0                                       Healthy Adult        4.3 - 5.7                                                           (DCCT/NGSP) American Diabetes Association's Summary of Glycemic Recommendations for Adults with Diabetes: Hemoglobin A1c <7.0%. More stringent glycemic goals (A1c <6.0%) may further reduce complications at the cost of increased risk of hypoglycemia.    Hgb A1c MFr Bld  Date Value Ref Range Status  07/26/2021 6.4 (H) 4.8 - 5.6 % Final    Comment:             Prediabetes: 5.7 - 6.4          Diabetes: >6.4          Glycemic control for adults with diabetes: <7.0          Passed - eGFR in normal range and within 360 days    EGFR (African American)  Date Value Ref Range Status  07/08/2013 >60  Final   GFR calc Af Amer  Date Value Ref Range Status  07/21/2019 89 >59 mL/min/1.73 Final    Comment:    **[facility] currently reports eGFR in compliance with the current**   recommendations of the [employer]. [facility] will   update reporting as new guidelines are published from the NKF-ASN   Task force.    EGFR (Non-African Amer.)  Date Value Ref Range Status   07/08/2013 >60  Final    Comment:    eGFR values <19m/min/1.73 m2 may be an indication of chronic kidney disease (CKD). Calculated eGFR is useful in patients with stable renal function. The eGFR calculation will not be reliable in acutely ill patients when serum creatinine is changing rapidly. It is not useful in  patients on dialysis. The eGFR calculation may not be applicable to patients at the low and high extremes of body sizes, pregnant women, and vegetarians.    GFR, Estimated  Date Value Ref Range Status  05/06/2021 51 (L) >60 mL/min Final    Comment:    (NOTE) Calculated using the CKD-EPI Creatinine Equation (2021)    eGFR  Date Value Ref Range Status  07/26/2021 72 >59 mL/min/1.73 Final         Passed - Valid encounter within last 6 months    Recent Outpatient Visits          3 months ago Annual physical exam   [place] [facility] L[facility]se Sans MD   6 months ago Diarrhea, unspecified type   [facility]   7 months ago Essential hypertension   [place] [facility]   11 months ago Type II diabetes mellitus with complication [facility]   [facility]   1 year ago Annual physical  exam   [facility] at [facility], Jesse Sans, MD      Future Appointments            In 1 week Army Melia Jesse Sans, MD [facility] Medicine at [facility], [facility]   In 8 months Army Melia Jesse Sans, MD [facility] Primary Care and Sports Medicine at [facility] [place] within normal limits and completed in the last 12 months    WBC  Date Value Ref Range Status  07/26/2021 7.5 3.4 - 10.8 x10E3/uL Final  05/06/2021 9.6 4.0 - 10.5 K/uL Final    RBC  Date Value Ref Range Status  07/26/2021 4.59 3.77 - 5.28 x10E6/uL Final  05/06/2021 4.97 3.87 - 5.11 MIL/uL Final   Hemoglobin  Date Value Ref Range Status  07/26/2021 13.6 11.1 - 15.9 g/dL Final   Hematocrit  Date Value Ref Range Status  07/26/2021 40.0 34.0 - 46.6 % Final   MCHC  Date Value Ref Range Status  07/26/2021 34.0 31.5 - 35.7 g/dL Final  05/06/2021 33.0 30.0 - 36.0 g/dL Final   [facility]  Date Value Ref Range Status  07/26/2021 29.6 26.6 - 33.0 pg Final  05/06/2021 29.0 26.0 - 34.0 pg Final   MCV  Date Value Ref Range Status  07/26/2021 87 79 - 97 fL Final  09/28/2013 85 80 - 100 fL Final   No results found for: "PLTCOUNTKUC", "LABPLAT", "POCPLA" RDW  Date Value Ref Range Status  07/26/2021 13.0 11.7 - 15.4 % Final  09/28/2013 12.9 11.5 - 14.5 % Final

## 2021-11-29 ENCOUNTER — Encounter: Payer: Self-pay | Admitting: Internal Medicine

## 2021-11-29 ENCOUNTER — Ambulatory Visit: Payer: Managed Care, Other (non HMO) | Admitting: Internal Medicine

## 2021-11-29 VITALS — BP 128/72 | HR 71 | Temp 98.1°F | Ht 60.0 in | Wt 124.0 lb

## 2021-11-29 DIAGNOSIS — E118 Type 2 diabetes mellitus with unspecified complications: Secondary | ICD-10-CM

## 2021-11-29 DIAGNOSIS — M797 Fibromyalgia: Secondary | ICD-10-CM

## 2021-11-29 DIAGNOSIS — F39 Unspecified mood [affective] disorder: Secondary | ICD-10-CM | POA: Diagnosis not present

## 2021-11-29 DIAGNOSIS — Z23 Encounter for immunization: Secondary | ICD-10-CM | POA: Diagnosis not present

## 2021-11-29 LAB — POCT GLYCOSYLATED HEMOGLOBIN (HGB A1C): Hemoglobin A1C: 7 % — AB (ref 4.0–5.6)

## 2021-11-29 NOTE — Progress Notes (Signed)
Date:  11/29/2021   Name:  Tracy Bryan   DOB:  Jun 08, 1960   MRN:  [id number]   Chief Complaint: Diabetes  Diabetes She presents for her follow-up diabetic visit. She has type 2 diabetes mellitus. Her disease course has been worsening (bs higher due to eating more ice cream and cakes). Hypoglycemia symptoms include nervousness/anxiousness. Pertinent negatives for hypoglycemia include no headaches or tremors. Pertinent negatives for diabetes include no chest pain, no fatigue, no polydipsia and no polyuria. Pertinent negatives for diabetic complications include no CVA, heart disease or peripheral neuropathy. Current diabetic treatments: Trulicity. She is compliant with treatment all of the time. She is following a generally healthy diet. An ACE inhibitor/angiotensin II receptor blocker is being taken. Eye exam is current.  Depression        This is a chronic problem.  The problem has been gradually worsening since onset.  Associated symptoms include no fatigue, no appetite change and no headaches.  Past treatments include SNRIs - Serotonin and norepinephrine reuptake inhibitors and other medications.  Compliance with treatment is good.  Previous treatment provided moderate relief.  Past medical history includes fibromyalgia.     Lab Results  Component Value Date   NA 139 07/26/2021   K 5.6 (H) 07/26/2021   CO2 23 07/26/2021   GLUCOSE 112 (H) 07/26/2021   BUN 20 07/26/2021   CREATININE 0.90 07/26/2021   CALCIUM 10.6 (H) 07/26/2021   EGFR 72 07/26/2021   GFRNONAA 51 (L) 05/06/2021   Lab Results  Component Value Date   CHOL 158 07/26/2021   HDL 47 07/26/2021   LDLCALC 59 07/26/2021   TRIG 337 (H) 07/26/2021   CHOLHDL 3.4 07/26/2021   Lab Results  Component Value Date   TSH 0.936 07/26/2021   Lab Results  Component Value Date   HGBA1C 6.4 (H) 07/26/2021   Lab Results  Component Value Date   WBC 7.5 07/26/2021   HGB 13.6 07/26/2021   HCT 40.0 07/26/2021   MCV 87  07/26/2021   PLT 312 07/26/2021   Lab Results  Component Value Date   ALT 24 07/26/2021   AST 18 07/26/2021   ALKPHOS 67 07/26/2021   BILITOT 0.4 07/26/2021   Lab Results  Component Value Date   VD25OH 52.8 05/28/2017     Review of Systems  Constitutional:  Negative for appetite change, fatigue, fever and unexpected weight change.  HENT:  Negative for tinnitus and trouble swallowing.   Eyes:  Negative for visual disturbance.  Respiratory:  Negative for cough, chest tightness, shortness of breath and wheezing.   Cardiovascular:  Positive for leg swelling (left ankle swelling at the end of the day). Negative for chest pain and palpitations.  Gastrointestinal:  Negative for abdominal pain.  Endocrine: Negative for polydipsia and polyuria.  Genitourinary:  Negative for dysuria and hematuria.  Musculoskeletal:  Negative for arthralgias.  Neurological:  Negative for tremors, numbness and headaches.  Psychiatric/Behavioral:  Positive for depression and dysphoric mood. Negative for sleep disturbance. The patient is nervous/anxious.     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   Sleep disorder 11/10/2019   Bilateral carpal tunnel syndrome 11/07/2016   Herpes simplex infection 11/23/2015   Fibromyalgia 03/11/2015   Menopause syndrome 02/17/2015   Esophageal reflux 02/17/2015   Vitamin D deficiency 02/17/2015   Hyperlipidemia associated with type 2 diabetes mellitus ([place]) 07/28/2014   Essential hypertension  07/28/2014   Type II diabetes mellitus with complication (HCC) 37/34/2876    Allergies  Allergen Reactions   Rabeprazole Sodium     C Diff Side Affects   Augmentin [Amoxicillin-Pot Clavulanate] Itching   Benazepril Hcl Cough   Penicillins Other (See Comments)   Jardiance [Empagliflozin] Rash    Recurrent yeast vaginitis    Past Surgical History:  Procedure Laterality Date    CERVICAL DISC SURGERY     COLONOSCOPY  07/22/2013   DILATION AND CURETTAGE OF UTERUS  1996   HERNIA REPAIR  8115   Umblical   HIP ARTHROPLASTY Right    TOTAL ABDOMINAL HYSTERECTOMY     TOTAL HIP ARTHROPLASTY Left 05/18/2014   Procedure: LEFT TOTAL HIP ARTHROPLASTY ANTERIOR APPROACH;  Surgeon: Melrose Nakayama, MD;  Location: [facility];  Service: Orthopedics;  Laterality: Left;    Social History   Tobacco Use   Smoking status: Former    Types: Cigarettes    Quit date: 06/14/1984    Years since quitting: 37.4   Smokeless tobacco: Never  Vaping Use   Vaping Use: Never used  Substance Use Topics   Alcohol use: No    Alcohol/week: 0.0 standard drinks of alcohol   Drug use: No     Medication list has been reviewed and updated.  Current Meds  Medication Sig   amLODipine (NORVASC) 5 MG tablet TAKE 1 TABLET (5 MG TOTAL) BY MOUTH DAILY.   aspirin 81 MG tablet Take 81 mg by mouth daily.   atorvastatin (LIPITOR) 40 MG tablet TAKE 1 TABLET BY MOUTH EVERY DAY   busPIRone (BUSPAR) 10 MG tablet TAKE 1 TABLET BY MOUTH THREE TIMES A DAY   Cholecalciferol (VITAMIN D) 2000 units CAPS Take 1 capsule (2,000 Units total) by mouth daily.   diphenoxylate-atropine (LOMOTIL) 2.5-0.025 MG tablet Take 1 tablet by mouth 4 (four) times daily as needed for diarrhea or loose stools.   DULoxetine (CYMBALTA) 60 MG capsule TAKE 1 CAPSULE BY MOUTH EVERY DAY   glucose blood test strip 1 each by Other route daily. Use as instructed   Icosapent Ethyl 0.5 g CAPS TAKE 2 CAPSULES BY MOUTH TWICE A DAY   lansoprazole (PREVACID) 15 MG capsule Take 15 mg by mouth daily at 12 noon.   losartan (COZAAR) 100 MG tablet TAKE 1 TABLET BY MOUTH EVERY DAY   metoprolol succinate (TOPROL-XL) 100 MG 24 hr tablet TAKE 0.5 TABLETS (50 MG TOTAL) BY MOUTH IN THE MORNING AND AT BEDTIME.   Multiple Vitamins-Minerals (MULTIVITAMIN WITH MINERALS) tablet Take 1 tablet by mouth daily.   ondansetron (ZOFRAN-ODT) 4 MG disintegrating tablet Take 1  tablet (4 mg total) by mouth every 6 (six) hours as needed for nausea or vomiting.   TRULICITY 7.26 OM/3.5DH SOPN INJECT 0.75 MG SUBCUTANEOUSLY ONE TIME PER WEEK   valACYclovir (VALTREX) 1000 MG tablet TAKE 1 TABLET BY MOUTH TWICE A DAY       11/29/2021    8:05 AM 07/26/2021    9:42 AM 05/08/2021    2:08 PM 04/03/2021   10:21 AM  GAD 7 : Generalized Anxiety Score  Nervous, Anxious, on Edge _0 Control/stop worrying _1 Worry too much - different things _2 Trouble relaxing _3 Restless _4 Easily annoyed or irritable _5 Afraid - awful might happen _6 0  Total GAD 7 Score _7 8  Anxiety Difficulty Very difficult Very difficult Somewhat difficult        11/29/2021    8:05 AM 07/26/2021    9:42 AM 05/08/2021    2:07 PM  Depression screen PHQ 2/9  Decreased Interest _0 Down, Depressed, Hopeless 1 0 1  PHQ - 2 Score _1 Altered sleeping _2 Tired, decreased energy 3  2  Change in appetite 3 0 2  Feeling bad or failure about yourself  1 1 0  Trouble concentrating 2 0 0  Moving slowly or fidgety/restless 1 1 0  Suicidal thoughts 0 0 0  PHQ-9 Score _3 Difficult doing work/chores Very difficult Not difficult at all Not difficult at all    BP Readings from Last 3 Encounters:  11/29/21 128/72  07/26/21 128/70  05/20/21 139/71    Physical Exam Constitutional:      Appearance: Normal appearance.  Cardiovascular:     Rate and Rhythm: Normal rate and regular rhythm.  Pulmonary:     Effort: Pulmonary effort is normal.     Breath sounds: Normal breath sounds. No wheezing or rhonchi.  Musculoskeletal:     Cervical back: Normal range of motion.     Right lower leg: No edema.     Left lower leg: No edema.  Lymphadenopathy:     Cervical: No cervical adenopathy.  Neurological:     General: No focal deficit present.     Mental Status: She is alert and oriented to person, place, and time.  Psychiatric:        Mood and Affect:  Mood is depressed.        Speech: Speech normal.        Thought Content: Thought content does not include suicidal ideation. Thought content does not include suicidal plan.     Wt Readings from Last 3 Encounters:  11/29/21 124 lb (56.2 kg)  07/26/21 118 lb 9.6 oz (53.8 kg)  05/20/21 114 lb (51.7 kg)    BP 128/72   Pulse 71   Temp 98.1 F (36.7 C) (Oral)   Ht 5' (1.524 m)   Wt 124 lb (56.2 kg)   LMP  (LMP Unknown)   SpO2 95%   BMI 24.22 kg/m   Assessment and Plan: 1. Type II diabetes mellitus with complication (HCC) Clinically stable by exam and report without s/s of hypoglycemia.  Slightly higher due to diet issues.   Will resume one Metformin daily.  Consider increasing Trulicity if needed. DM complicated by hypertension and dyslipidemia. Tolerating medications well without side effects or other concerns. - POCT glycosylated hemoglobin (Hb A1C)= 7.0  2. Mood disorder ([place]) More depression and anxiety recently.  Weight gain has contributed. Will increase Cymbalta to 60 mg bid Follow up if not improving.  3. Fibromyalgia Continue Cymbalta with dose increase. Encourage more regular physical activity.   Partially dictated using Editor, commissioning. Any errors are unintentional.  Halina Maidens, MD [facility]  11/29/2021

## 2021-11-29 NOTE — Patient Instructions (Signed)
Increase cymbalta to 60 mg twice a day  Add back one metformin and watch blood sugars.

## 2021-11-30 ENCOUNTER — Telehealth: Payer: Self-pay | Admitting: Internal Medicine

## 2021-11-30 ENCOUNTER — Telehealth: Payer: Self-pay

## 2021-11-30 ENCOUNTER — Other Ambulatory Visit: Payer: Self-pay | Admitting: Internal Medicine

## 2021-11-30 ENCOUNTER — Other Ambulatory Visit: Payer: Self-pay

## 2021-11-30 DIAGNOSIS — E118 Type 2 diabetes mellitus with unspecified complications: Secondary | ICD-10-CM

## 2021-11-30 MED ORDER — METFORMIN HCL ER 500 MG PO TB24: 500.0000 mg | ORAL_TABLET | Freq: Every day | ORAL | 1 refills | Status: DC

## 2021-11-30 MED ORDER — METFORMIN HCL 500 MG PO TABS: 500.0000 mg | ORAL_TABLET | Freq: Every day | ORAL | 1 refills | Status: DC

## 2021-11-30 NOTE — Telephone Encounter (Signed)
Completed PA on Trulicity, and sent in metformin for patient to the pharmacy for once daily per Dr Army Melia. Pt informed of both completed. Told patient it may take a few days for PA to come back from insurance, and she verbalized understanding.  - Tracy Bryan

## 2021-11-30 NOTE — Telephone Encounter (Signed)
Completed PA on [email or web address] for Trulicity 0.75mg /0.85mL.  Key: [id number] - PA Case ID: [id number]  Awaiting outcome.

## 2021-11-30 NOTE — Telephone Encounter (Signed)
Medication Refill - Medication: metFORMININ 4627 mg, TRULICITY 0.35 KK/9.3GH SOPN  Has the patient contacted their pharmacy? Yes.     Preferred Pharmacy (with phone number or street name):  [id number] - [place], [place] Phone:  [phone]  Fax:  [phone]     Has the patient been seen for an appointment in the last year OR does the patient have an upcoming appointment? Yes.      The patient called in stating her provider wanted her to go back on metFORMIN at her appointment yesterday. She said her provider said to take 1 pill but not sure on the correct dosage It was crossed off on her file because she hasn't taken it in awhile. Also she says her insurance is requiring a prior authorization on the Trulicity. Please assist patient further

## 2021-12-01 NOTE — Telephone Encounter (Signed)
Approved until 11/30/2022.

## 2021-12-01 NOTE — Telephone Encounter (Signed)
Patient informed.  Tracy Bryan 

## 2021-12-04 ENCOUNTER — Other Ambulatory Visit: Payer: Self-pay | Admitting: Internal Medicine

## 2021-12-04 DIAGNOSIS — I10 Essential (primary) hypertension: Secondary | ICD-10-CM

## 2021-12-05 NOTE — Telephone Encounter (Signed)
[facility] called saying [facility] not [facility] for the Tr[facility]icity.  The PA was approved and they are waiting so the patient can get her medication.

## 2021-12-05 NOTE — Telephone Encounter (Signed)
Refilled 11/20/2021 #[id number] Requested Prescriptions  Pending Prescriptions Disp Refills  . losartan (COZAAR) 100 MG tablet [Pharmacy Med Name: LOSARTAN POTASSIUM 100 MG TAB] 90 tablet 0    Sig: TAKE 1 TABLET BY MOUTH EVERY DAY     Cardiovascular:  Angiotensin Receptor Blockers Failed - 12/04/2021 10:02 AM      Failed - K in normal range and within 180 days    Potassium  Date Value Ref Range Status  07/26/2021 5.6 (H) 3.5 - 5.2 mmol/L Final  07/08/2013 3.5 3.5 - 5.1 mmol/L Final         Passed - Cr in normal range and within 180 days    Creatinine  Date Value Ref Range Status  07/08/2013 0.92 0.60 - 1.30 mg/dL Final   Creatinine, Ser  Date Value Ref Range Status  07/26/2021 0.90 0.57 - 1.00 mg/dL Final         Passed - Patient is not pregnant      Passed - Last BP in normal range    BP Readings from Last 1 Encounters:  11/29/21 128/72         Passed - Valid encounter within last 6 months    Recent Outpatient Visits          6 days ago Type II diabetes mellitus with complication [facility])   [place] [facility] at [facility], Jesse Sans, MD   4 months ago Annual physical exam   [place] [facility] at [facility], Jesse Sans, MD   7 months ago Diarrhea, unspecified type   [facility] Care and [facility] Medicine at [facility], Jesse Sans, MD   8 months ago Essential hypertension   [place] [facility] and [facility] Medicine at [facility], Jesse Sans, MD   1 year ago Type II diabetes mellitus with complication [facility])    Primary Care and Sports Medicine at [facility], Jesse Sans, MD      Future Appointments            In 3 months Army Melia, Jesse Sans, MD [facility] Care and [facility] Medicine at [facility], [facility]   In 7 months Army Melia, Jesse Sans, MD [place] [facility] and Sports Medicine at [facility], [facility]

## 2021-12-07 ENCOUNTER — Telehealth: Payer: Self-pay | Admitting: Internal Medicine

## 2021-12-07 NOTE — Telephone Encounter (Signed)
Called pharmacy and they are filling prescription now. Patient informed.   - Aliese Brannum

## 2021-12-07 NOTE — Telephone Encounter (Signed)
Copied from [place] [id number]. Topic: General - Other >> Dec 07, 2021  9:18 AM Cyndi Bender wrote: Reason for CRM: Pt reports that her insurance has approved the TRULICITY 4.53 MI/6.8EH SOPN but the pharmacy stated they are waiting on Dr. Army Melia to submit approval.

## 2021-12-16 ENCOUNTER — Other Ambulatory Visit: Payer: Self-pay | Admitting: Internal Medicine

## 2021-12-18 NOTE — Telephone Encounter (Signed)
Unable to refill per protocol, Rx request is too soon, last refill 11/20/21 for 90, request is too soon.E-Prescribing Status: Receipt confirmed by pharmacy (11/20/2021 11:24 AM EDT) . Will refuse.   Requested Prescriptions  Pending Prescriptions Disp Refills   metoprolol succinate (TOPROL-XL) 100 MG 24 hr tablet [Pharmacy Med Name: METOPROLOL SUCC ER 100 MG TAB] 90 tablet 0    Sig: TAKE 0.5 TABLETS (50 MG TOTAL) BY MOUTH IN THE MORNING AND AT BEDTIME.     Cardiovascular:  Beta Blockers Passed - 12/16/2021  7:32 PM      Passed - Last BP in normal range    BP Readings from Last 1 Encounters:  11/29/21 128/72         Passed - Last Heart Rate in normal range    Pulse Readings from Last 1 Encounters:  11/29/21 71         Passed - Valid encounter within last 6 months    Recent Outpatient Visits           2 weeks ago Type II diabetes mellitus with complication ([place])   [place] [facility] at [facility], Jesse Sans, MD   4 months ago Annual physical exam   [place] [facility] and [facility] Medicine at [facility], Jesse Sans, MD   7 months ago Diarrhea, unspecified type   [facility] Care and [facility] Medicine at [facility], Jesse Sans, MD   8 months ago Essential hypertension   [place] Primary Care and Sports Medicine at [facility], Jesse Sans, MD   1 year ago Type II diabetes mellitus with complication [facility])   [place] Primary Care and Sports Medicine at [facility], Jesse Sans, MD       Future Appointments             In 3 months Army Melia, Jesse Sans, MD [facility] at [facility], [facility]   In 7 months Army Melia, Jesse Sans, MD [place] [facility] and Sports Medicine at [facility], [facility]

## 2022-01-19 ENCOUNTER — Other Ambulatory Visit: Payer: Self-pay | Admitting: Internal Medicine

## 2022-01-19 DIAGNOSIS — I10 Essential (primary) hypertension: Secondary | ICD-10-CM

## 2022-01-19 DIAGNOSIS — E118 Type 2 diabetes mellitus with unspecified complications: Secondary | ICD-10-CM

## 2022-01-19 DIAGNOSIS — F411 Generalized anxiety disorder: Secondary | ICD-10-CM

## 2022-01-22 NOTE — Telephone Encounter (Signed)
Requested Prescriptions  Pending Prescriptions Disp Refills   losartan (COZAAR) 100 MG tablet [Pharmacy Med Name: LOSARTAN POTASSIUM 100 MG TAB] 90 tablet 0    Sig: TAKE 1 TABLET BY MOUTH EVERY DAY     Cardiovascular:  Angiotensin Receptor Blockers Failed - 01/19/2022  9:37 PM      Failed - K in normal range and within 180 days    Potassium  Date Value Ref Range Status  07/26/2021 5.6 (H) 3.5 - 5.2 mmol/L Final  07/08/2013 3.5 3.5 - 5.1 mmol/L Final         Passed - Cr in normal range and within 180 days    Creatinine  Date Value Ref Range Status  07/08/2013 0.92 0.60 - 1.30 mg/dL Final   Creatinine, Ser  Date Value Ref Range Status  07/26/2021 0.90 0.57 - 1.00 mg/dL Final         Passed - Patient is not pregnant      Passed - Last BP in normal range    BP Readings from Last 1 Encounters:  11/29/21 128/72         Passed - Valid encounter within last 6 months    Recent Outpatient Visits           1 month ago Type II diabetes mellitus with complication ([place])   [place] [facility] at [facility], Jesse Sans, MD   6 months ago Annual physical exam   [place] [facility] and Sports Medicine at [facility], Jesse Sans, MD   8 months ago Diarrhea, unspecified type   [facility] Primary Care and Sports Medicine at [facility], Jesse Sans, MD   9 months ago Essential hypertension   [place] Primary Care and Sports Medicine at [facility], Jesse Sans, MD   1 year ago Type II diabetes mellitus with complication [facility])   [place] Primary Care and Sports Medicine at [facility], Jesse Sans, MD       Future Appointments             In 2 months Army Melia Jesse Sans, MD [facility] Primary Care and Sports Medicine at [facility], [facility]   In 6 months Army Melia, Jesse Sans, MD [facility] Primary Care and Sports Medicine at [place], [facility]             amLODipine (NORVASC) 5 MG tablet  [Pharmacy Med Name: AMLODIPINE BESYLATE 5 MG TAB] 90 tablet 0    Sig: TAKE 1 TABLET (5 MG TOTAL) BY MOUTH DAILY.     Cardiovascular: Calcium Channel Blockers 2 Passed - 01/19/2022  9:37 PM      Passed - Last BP in normal range    BP Readings from Last 1 Encounters:  11/29/21 128/72         Passed - Last Heart Rate in normal range    Pulse Readings from Last 1 Encounters:  11/29/21 71         Passed - Valid encounter within last 6 months    Recent Outpatient Visits           1 month ago Type II diabetes mellitus with complication [facility])    Primary Care and Sports Medicine at [facility], Jesse Sans, MD   6 months ago Annual physical exam   [facility] Primary Care and Sports Medicine at [facility], Jesse Sans, MD   8 months ago Diarrhea, unspecified type  [place] Primary Care and Sports [facility] at [facility], Jesse Sans, MD   9 months ago Essential hypertension   [place] Primary Care and Sports Medicine at [facility], Jesse Sans, MD   1 year ago Type II diabetes mellitus with complication [facility])   [place] Primary Care and Sports Medicine at [facility], Jesse Sans, MD       Future Appointments             In 2 months Army Melia Jesse Sans, MD [facility] Primary Care and Sports Medicine at [facility], [facility]   In 6 months Glean Hess, MD [facility] Health Primary Care and Sports Medicine at [facility], [facility]             metoprolol succinate (TOPROL-XL) 100 MG 24 hr tablet [Pharmacy Med Name: METOPROLOL SUCC ER 100 MG TAB] 90 tablet 0    Sig: TAKE 0.5 TABLETS (50 MG TOTAL) BY MOUTH IN THE MORNING AND AT BEDTIME.     Cardiovascular:  Beta Blockers Passed - 01/19/2022  9:37 PM      Passed - Last BP in normal range    BP Readings from Last 1 Encounters:  11/29/21 128/72         Passed - Last Heart Rate in normal range    Pulse Readings from Last 1 Encounters:  11/29/21 71          Passed - Valid encounter within last 6 months    Recent Outpatient Visits           1 month ago Type II diabetes mellitus with complication ([place])   [place] Primary Care and Sports Medicine at [facility], Jesse Sans, MD   6 months ago Annual physical exam   [place] Primary Care and Sports Medicine at [facility], Jesse Sans, MD   8 months ago Diarrhea, unspecified type   [facility] Health Primary Care and Sports Medicine at [facility], Jesse Sans, MD   9 months ago Essential hypertension   [place] Primary Care and Sports Medicine at [facility], Jesse Sans, MD   1 year ago Type II diabetes mellitus with complication [facility])    Primary Care and Sports Medicine at [facility], Jesse Sans, MD       Future Appointments             In 2 months Army Melia Jesse Sans, MD [facility] Health Primary Care and Sports Medicine at [facility], [facility]   In 6 months Army Melia, Jesse Sans, MD [facility] Health Primary Care and Sports Medicine at [facility], [place]             DULoxetine (CYMBALTA) 60 MG capsule [Pharmacy Med Name: DULOXETINE HCL DR 60 MG CAP] 90 capsule 0    Sig: TAKE 1 [place]     Psychiatry: Antidepressants - SNRI - duloxetine Passed - 01/19/2022  9:37 PM      Passed - Cr in normal range and within 360 days    Creatinine  Date Value Ref Range Status  07/08/2013 0.92 0.60 - 1.30 mg/dL Final   Creatinine, Ser  Date Value Ref Range Status  07/26/2021 0.90 0.57 - 1.00 mg/dL Final         Passed - eGFR is 30 or above and within 360 days    EGFR (African American)  Date Value Ref Range Status  07/08/2013 >60  Final  GFR calc Af Amer  Date Value Ref Range Status  07/21/2019 89 >59 mL/min/1.73 Final    Comment:    **[facility] currently reports eGFR in compliance with the current**   recommendations of the [employer]. [facility] will   update reporting as new guidelines are published  from the NKF-ASN   Task force.    EGFR (Non-African Amer.)  Date Value Ref Range Status  07/08/2013 >60  Final    Comment:    eGFR values <18m/min/1.73 m2 may be an indication of chronic kidney disease (CKD). Calculated eGFR is useful in patients with stable renal function. The eGFR calculation will not be reliable in acutely ill patients when serum creatinine is changing rapidly. It is not useful in  patients on dialysis. The eGFR calculation may not be applicable to patients at the low and high extremes of body sizes, pregnant women, and vegetarians.    GFR, Estimated  Date Value Ref Range Status  05/06/2021 51 (L) >60 mL/min Final    Comment:    (NOTE) Calculated using the CKD-EPI Creatinine Equation (2021)    eGFR  Date Value Ref Range Status  07/26/2021 72 >59 mL/min/1.73 Final         Passed - Completed PHQ-2 or PHQ-9 in the last 360 days      Passed - Last BP in normal range    BP Readings from Last 1 Encounters:  11/29/21 128/72         Passed - Valid encounter within last 6 months    Recent Outpatient Visits           1 month ago Type II diabetes mellitus with complication ([facility]   [place] [facility]   6 months ago Annual physical exam   [place] Primary Care and Sports Medicine at [facility] L[facility]se Sans MD   8 months ago Diarrhea, unspecified type   [facility] at [facility]   9 months ago Essential hypertension   [place] [facility] Care and Sports Medicine at [facility] L[facility]se Sans MD   1 year ago Type II diabetes mellitus with complication ([facility]   [place] Primary Care and Sports Medicine at [facility] L[facility] MD       Future Appointments             In 2 months BArmy MeliaLJesse Sans MD [facility] Care and Sports Medicine at [facility]  In 6 months  BArmy Melia LJesse Sans MD [facility] and Sports Medicine at [facility]            busPIRone (BUSPAR) 10 MG tablet [Pharmacy Med Name: BUSPIRONE HCL 10 MG TABLET] 270 tablet 0    Sig: TAKE 1 TABLET BY MOUTH THREE TIMES A DAY     Psychiatry: Anxiolytics/Hypnotics - Non-controlled Passed - 01/19/2022  9:37 PM      Passed - Valid encounter within last 12 months    Recent Outpatient Visits           1 month ago Type II diabetes mellitus with complication ([facility]   [place] [facility] Sports Medicine at [facility] L[facility]   6 months ago Annual physical exam   [place] [facility] L[facility]se Sans MD   8 months  ago Diarrhea, unspecified type   [place] [facility] Care and Sports Medicine at [facility], Jesse Sans, MD   9 months ago Essential hypertension   [place] Primary Care and Sports Medicine at [facility], Jesse Sans, MD   1 year ago Type II diabetes mellitus with complication [facility])   [place] Primary Care and Sports Medicine at [facility], Jesse Sans, MD       Future Appointments             In 2 months Army Melia Jesse Sans, MD [facility] Health Primary Care and Sports Medicine at [facility], [facility]   In 6 months Glean Hess, MD [facility] Primary Care and Sports Medicine at [place], [place] 7.00 FV/4.9SW SOPN [Pharmacy Med Name: TRULICITY 9.67 RF/1.6 ML PEN] 6 mL 0    Sig: INJECT 0.75 MG SUBCUTANEOUSLY ONE TIME PER WEEK     Endocrinology:  Diabetes - GLP-1 Receptor Agonists Passed - 01/19/2022  9:37 PM      Passed - HBA1C is between 0 and 7.9 and within 180 days    Hemoglobin A1C  Date Value Ref Range Status  11/29/2021 7.0 (A) 4.0 - 5.6 % Final   HB A1C (BAYER DCA - WAIVED)  Date Value Ref Range Status  07/28/2014 6.9 <7.0 % Final    Comment:                                          Diabetic Adult             <7.0                                       Healthy Adult        4.3 - 5.7                                                           (DCCT/NGSP) American Diabetes Association's Summary of Glycemic Recommendations for Adults with Diabetes: Hemoglobin A1c <7.0%. More stringent glycemic goals (A1c <6.0%) may further reduce complications at the cost of increased risk of hypoglycemia.    Hgb A1c MFr Bld  Date Value Ref Range Status  07/26/2021 6.4 (H) 4.8 - 5.6 % Final    Comment:             Prediabetes: 5.7 - 6.4          Diabetes: >6.4          Glycemic control for adults with diabetes: <7.0          Passed - Valid encounter within last 6 months    Recent Outpatient Visits           1 month ago Type II diabetes mellitus with complication [facility])   [place] Primary Care and Sports Medicine at [facility], Jesse Sans, MD   6 months ago Annual physical exam   [facility] Primary Care and Sports Medicine at [facility], Jesse Sans, MD   8 months ago  Diarrhea, unspecified type   [place] Primary Care and Sports Medicine at [facility], Jesse Sans, MD   9 months ago Essential hypertension   [place] [facility] Care and Sports Medicine at [facility], Jesse Sans, MD   1 year ago Type II diabetes mellitus with complication [facility])    Primary [facility] and Sports Medicine at [facility], Jesse Sans, MD       Future Appointments             In 2 months Army Melia Jesse Sans, MD [facility] Primary Care and Sports Medicine at [facility], [facility]   In 6 months Glean Hess, MD [facility] Health Primary Care and Sports Medicine at [facility], [facility]             metFORMIN (GLUCOPHAGE-XR) 500 MG 24 hr tablet [Pharmacy Med Name: METFORMIN HCL ER 500 MG TABLET] 180 tablet     Sig: TAKE 1 TABLET BY [place] A DAY     Endocrinology:  Diabetes - Biguanides Failed - 01/19/2022  9:37 PM      Failed - B12 Level in  normal range and within 720 days    No results found for: "VITAMINB12"       Passed - Cr in normal range and within 360 days    Creatinine  Date Value Ref Range Status  07/08/2013 0.92 0.60 - 1.30 mg/dL Final   Creatinine, Ser  Date Value Ref Range Status  07/26/2021 0.90 0.57 - 1.00 mg/dL Final         Passed - HBA1C is between 0 and 7.9 and within 180 days    Hemoglobin A1C  Date Value Ref Range Status  11/29/2021 7.0 (A) 4.0 - 5.6 % Final   HB A1C (BAYER DCA - WAIVED)  Date Value Ref Range Status  07/28/2014 6.9 <7.0 % Final    Comment:                                          Diabetic Adult            <7.0                                       Healthy Adult        4.3 - 5.7                                                           (DCCT/NGSP) American Diabetes Association's Summary of Glycemic Recommendations for Adults with Diabetes: Hemoglobin A1c <7.0%. More stringent glycemic goals (A1c <6.0%) may further reduce complications at the cost of increased risk of hypoglycemia.    Hgb A1c MFr Bld  Date Value Ref Range Status  07/26/2021 6.4 (H) 4.8 - 5.6 % Final    Comment:             Prediabetes: 5.7 - 6.4          Diabetes: >6.4          Glycemic control for adults with diabetes: <7.0  Passed - eGFR in normal range and within 360 days    EGFR (African American)  Date Value Ref Range Status  07/08/2013 >60  Final   GFR calc Af Amer  Date Value Ref Range Status  07/21/2019 89 >59 mL/min/1.73 Final    Comment:    **[facility] currently reports eGFR in compliance with the current**   recommendations of the [employer]. [facility] will   update reporting as new guidelines are published from the NKF-ASN   Task force.    EGFR (Non-African Amer.)  Date Value Ref Range Status  07/08/2013 >60  Final    Comment:    eGFR values <62m/min/1.73 m2 may be an indication of chronic kidney disease (CKD). Calculated eGFR is useful in patients with  stable renal function. The eGFR calculation will not be reliable in acutely ill patients when serum creatinine is changing rapidly. It is not useful in  patients on dialysis. The eGFR calculation may not be applicable to patients at the low and high extremes of body sizes, pregnant women, and vegetarians.    GFR, Estimated  Date Value Ref Range Status  05/06/2021 51 (L) >60 mL/min Final    Comment:    (NOTE) Calculated using the CKD-EPI Creatinine Equation (2021)    eGFR  Date Value Ref Range Status  07/26/2021 72 >59 mL/min/1.73 Final         Passed - Valid encounter within last 6 months    Recent Outpatient Visits           1 month ago Type II diabetes mellitus with complication ([facility]   [place] [facility] Care and Sports Medicine at [facility] LJesse Sans MD   6 months ago Annual physical exam   [place] [facility] and Sports Medicine at [facility] L[facility]se Sans MD   8 months ago Diarrhea, unspecified type   [facility] Primary Care and Sports Medicine at [facility] LJesse Sans MD   9 months ago Essential hypertension   [place] Primary Care and Sports Medicine at [facility] LJesse Sans MD   1 year ago Type II diabetes mellitus with complication ([facility]   [place] Primary Care and Sports Medicine at [facility] LJesse Sans MD       Future Appointments             In 2 months BArmy MeliaLJesse Sans MD C[facility] CenterHealth Primary Care and Sports Medicine at [facility]  In 6 months BGlean Hess MD CSurgical [facility] Primary Care and Sports Medicine at [facility] PBonifaywithin normal limits and completed in the last 12 months    WBC  Date Value Ref Range Status  07/26/2021 7.5 3.4 - 10.8 x10E3/uL Final  05/06/2021 9.6 4.0 - 10.5 K/uL Final   RBC  Date Value Ref Range Status  07/26/2021 4.59 3.77 - 5.28 x10E6/uL Final  05/06/2021 4.97 3.87 - 5.11 MIL/uL Final    Hemoglobin  Date Value Ref Range Status  07/26/2021 13.6 11.1 - 15.9 g/dL Final   Hematocrit  Date Value Ref Range Status  07/26/2021 40.0 34.0 - 46.6 % Final   MCHC  Date Value Ref Range Status  07/26/2021 34.0 31.5 - 35.7 g/dL Final  05/06/2021 33.0 30.0 - 36.0 g/dL Final   [facility] Date Value Ref Range Status  07/26/2021 29.6 26.6 - 33.0 pg Final  05/06/2021 29.0 26.0 - 34.0 pg Final   MCV  Date Value Ref Range Status  07/26/2021 87 79 - 97 fL Final  09/28/2013 85 80 - 100 fL Final   No results found for: "PLTCOUNTKUC", "LABPLAT", "POCPLA" RDW  Date Value Ref Range Status  07/26/2021 13.0 11.7 - 15.4 % Final  09/28/2013 12.9 11.5 - 14.5 % Final

## 2022-01-23 ENCOUNTER — Other Ambulatory Visit: Payer: Self-pay | Admitting: Internal Medicine

## 2022-01-23 NOTE — Telephone Encounter (Signed)
Requested medication (s) are due for refill today: -  Requested medication (s) are on the active medication list: yes  Last refill:  01/22/22  Future visit scheduled: yes  Notes to clinic:  dose was increased to twice daily at [place] 11/29/21. Please review   Requested Prescriptions  Pending Prescriptions Disp Refills   DULoxetine (CYMBALTA) 60 MG capsule      Sig: Take by mouth daily.     Psychiatry: Antidepressants - SNRI - duloxetine Passed - 01/23/2022  8:56 AM      Passed - Cr in normal range and within 360 days    Creatinine  Date Value Ref Range Status  07/08/2013 0.92 0.60 - 1.30 mg/dL Final   Creatinine, Ser  Date Value Ref Range Status  07/26/2021 0.90 0.57 - 1.00 mg/dL Final         Passed - eGFR is 30 or above and within 360 days    EGFR (African American)  Date Value Ref Range Status  07/08/2013 >60  Final   GFR calc Af Amer  Date Value Ref Range Status  07/21/2019 89 >59 mL/min/1.73 Final    Comment:    **[facility] currently reports eGFR in compliance with the current**   recommendations of the [employer]. [facility] will   update reporting as new guidelines are published from the NKF-ASN   Task force.    EGFR (Non-African Amer.)  Date Value Ref Range Status  07/08/2013 >60  Final    Comment:    eGFR values <47m/min/1.73 m2 may be an indication of chronic kidney disease (CKD). Calculated eGFR is useful in patients with stable renal function. The eGFR calculation will not be reliable in acutely ill patients when serum creatinine is changing rapidly. It is not useful in  patients on dialysis. The eGFR calculation may not be applicable to patients at the low and high extremes of body sizes, pregnant women, and vegetarians.    GFR, Estimated  Date Value Ref Range Status  05/06/2021 51 (L) >60 mL/min Final    Comment:    (NOTE) Calculated using the CKD-EPI Creatinine Equation (2021)    eGFR  Date Value Ref Range Status  07/26/2021 72  >59 mL/min/1.73 Final         Passed - Completed PHQ-2 or PHQ-9 in the last 360 days      Passed - Last BP in normal range    BP Readings from Last 1 Encounters:  11/29/21 128/72         Passed - Valid encounter within last 6 months    Recent Outpatient Visits           1 month ago Type II diabetes mellitus with complication ([facility]   [place] Primary Care and Sports Medicine at [facility]   6 months ago Annual physical exam   [place] [facility] and Sports Medicine [facility]   8 months ago Diarrhea, unspecified type   [facility] Primary Care and Sports Medicine at [facility] LJesse Sans MD   9 months ago Essential hypertension   [place] Primary Care and Sports Medicine at [facility] LJesse Sans MD   1 year ago Type II diabetes mellitus with complication ([facility]   [place] Primary Care and Sports Medicine at [facility] LJesse Sans MD       Future Appointments  In 2 months Army Melia, Jesse Sans, MD [facility] Primary [facility] and Sports Medicine at [facility], [facility]   In 6 months Army Melia, Jesse Sans, MD [facility] Primary Care and Sports Medicine at [facility], [facility]

## 2022-01-23 NOTE — Telephone Encounter (Signed)
Medication Refill - Medication:  DULoxetine (CYMBALTA) 60 MG capsul  Pharmacy says refill to early, pt says Dr Ruel Favors increased dosage, to 2 a day  Has the patient contacted their pharmacy? yes (Agent: If no, request that the patient contact the pharmacy for the refill. If patient does not wish to contact the pharmacy document the reason why and proceed with request.) (Agent: If yes, when and what did the pharmacy advise?)contact pcp  Preferred Pharmacy (with phone number or street name):  [id number], [place] - [address] Phone: [phone]  Fax: [phone]     Has the patient been seen for an appointment in the last year OR does the patient have an upcoming appointment? yes  Agent: Please be advised that RX refills may take up to 3 business days. We ask that you follow-up with your pharmacy.

## 2022-01-29 ENCOUNTER — Other Ambulatory Visit: Payer: Self-pay | Admitting: Internal Medicine

## 2022-01-29 DIAGNOSIS — F39 Unspecified mood [affective] disorder: Secondary | ICD-10-CM

## 2022-01-29 MED ORDER — DULOXETINE HCL 60 MG PO CPEP: 60.0000 mg | ORAL_CAPSULE | Freq: Two times a day (BID) | ORAL | 1 refills | Status: DC

## 2022-01-29 MED ORDER — DULOXETINE HCL 60 MG PO CPEP: 120.0000 mg | ORAL_CAPSULE | Freq: Two times a day (BID) | ORAL | 1 refills | Status: DC

## 2022-02-12 ENCOUNTER — Other Ambulatory Visit: Payer: Self-pay | Admitting: Internal Medicine

## 2022-02-12 DIAGNOSIS — E1169 Type 2 diabetes mellitus with other specified complication: Secondary | ICD-10-CM

## 2022-02-13 NOTE — Telephone Encounter (Signed)
Requested medication (s) are due for refill today: yes  Requested medication (s) are on the active medication list: yes    Last refill: 10/03/21  #360  0 refills  Future visit scheduled Yes 04/02/22  Notes to clinic:Off protocol, please review. Thank you.  Requested Prescriptions  Pending Prescriptions Disp Refills   Icosapent Ethyl 0.5 g CAPS [Pharmacy Med Name: ICOSAPENT ETHYL 500 MG CAPSULE] 360 capsule 0    Sig: TAKE 2 CAPSULES BY MOUTH TWICE A DAY     Off-Protocol Failed - 02/12/2022 11:16 AM      Failed - Medication not assigned to a protocol, review manually.      Passed - Valid encounter within last 12 months    Recent Outpatient Visits           2 months ago Type II diabetes mellitus with complication [facility])   [place] [facility] Care and Sports Medicine at [facility], Jesse Sans, MD   6 months ago Annual physical exam   [place] Primary Care and Sports Medicine at [facility], Jesse Sans, MD   9 months ago Diarrhea, unspecified type   [facility] Health Primary Care and Sports Medicine at [facility], Jesse Sans, MD   10 months ago Essential hypertension   [place] Primary Care and Sports Medicine at [facility], Jesse Sans, MD   1 year ago Type II diabetes mellitus with complication [facility]   [place] Primary Care and Sports Medicine at [facility], Jesse Sans, MD       Future Appointments             In 1 month Army Melia, Jesse Sans, MD [facility] Health Primary Care and Sports Medicine at [facility], [facility]   In 5 months Army Melia, Jesse Sans, MD [place] Primary Care and Sports Medicine at [facility], [facility]

## 2022-03-07 ENCOUNTER — Encounter: Payer: Self-pay | Admitting: Internal Medicine

## 2022-03-07 ENCOUNTER — Other Ambulatory Visit: Payer: Self-pay | Admitting: Internal Medicine

## 2022-03-07 DIAGNOSIS — Z1231 Encounter for screening mammogram for malignant neoplasm of breast: Secondary | ICD-10-CM

## 2022-03-27 ENCOUNTER — Other Ambulatory Visit: Payer: Self-pay

## 2022-03-27 ENCOUNTER — Encounter: Payer: Self-pay | Admitting: Emergency Medicine

## 2022-03-27 ENCOUNTER — Ambulatory Visit
Admission: EM | Admit: 2022-03-27 | Discharge: 2022-03-27 | Disposition: A | Discharge status: Home / Self Care | Payer: Managed Care, Other (non HMO) | Attending: Emergency Medicine | Admitting: Emergency Medicine

## 2022-03-27 DIAGNOSIS — J069 Acute upper respiratory infection, unspecified: Secondary | ICD-10-CM

## 2022-03-27 MED ORDER — PREDNISONE 20 MG PO TABS: 40.0000 mg | ORAL_TABLET | Freq: Every day | ORAL | 0 refills | Status: DC

## 2022-03-27 MED ORDER — AZITHROMYCIN 250 MG PO TABS: 250.0000 mg | ORAL_TABLET | Freq: Every day | ORAL | 0 refills | Status: DC

## 2022-03-27 MED ORDER — GUAIFENESIN-CODEINE 100-10 MG/5ML PO SOLN: 5.0000 mL | Freq: Four times a day (QID) | ORAL | 0 refills | Status: DC | PRN

## 2022-03-27 MED ORDER — BENZONATATE 100 MG PO CAPS: 100.0000 mg | ORAL_CAPSULE | Freq: Three times a day (TID) | ORAL | 0 refills | Status: DC

## 2022-03-27 NOTE — ED Provider Notes (Signed)
[facility]    CSN: [id number] Arrival date & time: 03/27/22  1734      History   Chief Complaint Chief Complaint  Patient presents with   Headache    HPI Tracy Bryan is a 62 y.o. female.   Patient presents for evaluation of fevers, chills, body aches, nasal congestion, rhinorrhea, bilateral ear pain, sore throat, generalized headaches, cough and wheezing present for 2 days.  Known sick contacts with viral illness.  Fever peaking at 102.  Decreased appetite but tolerating some food and fluids.  Has had intermittent generalized abdominal cramping feelings as if she will need to have diarrhea but has not done so, denies nausea and vomiting.  Has attempted use of Delsym, Tums and Advil which have been mildly effective.  Denies respiratory history, former smoker.    Past Medical History:  Diagnosis Date   Arthritis    Diabetes mellitus without complication ([place])    Type 2   Disseminated intravascular coagulation ([place]) 1999   After child birth   GERD (gastroesophageal reflux disease)    Herpes simplex    Hypercholesteremia    Hypertension    Migraines    Pneumonia    PONV (postoperative nausea and vomiting)     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   Sleep disorder 11/10/2019   Bilateral carpal tunnel syndrome 11/07/2016   Herpes simplex infection 11/23/2015   Fibromyalgia 03/11/2015   Menopause syndrome 02/17/2015   Esophageal reflux 02/17/2015   Vitamin D deficiency 02/17/2015   Hyperlipidemia associated with type 2 diabetes mellitus ([place]) 07/28/2014   Essential hypertension 07/28/2014   Type II diabetes mellitus with complication ([place]) 99991111    Past Surgical History:  Procedure Laterality Date   CERVICAL DISC SURGERY     COLONOSCOPY  07/22/2013   DILATION AND CURETTAGE OF UTERUS  1996   HERNIA REPAIR  [id number]   Umblical   HIP ARTHROPLASTY Right     TOTAL ABDOMINAL HYSTERECTOMY     TOTAL HIP ARTHROPLASTY Left 05/18/2014   Procedure: LEFT TOTAL HIP ARTHROPLASTY ANTERIOR APPROACH;  Surgeon: Melrose Nakayama, MD;  Location: [place];  Service: Orthopedics;  Laterality: Left;    OB History   No obstetric history on file.      Home Medications    Prior to Admission medications   Medication Sig Start Date End Date Taking? Authorizing Provider  amLODipine (NORVASC) 5 MG tablet TAKE 1 TABLET (5 MG TOTAL) BY MOUTH DAILY. 01/22/22   Glean Hess, MD  aspirin 81 MG tablet Take 81 mg by mouth daily.    [provider]  atorvastatin (LIPITOR) 40 MG tablet TAKE 1 TABLET BY MOUTH EVERY DAY 09/08/21   Glean Hess, MD  busPIRone (BUSPAR) 10 MG tablet TAKE 1 TABLET BY MOUTH THREE TIMES A DAY 01/22/22   Glean Hess, MD  Cholecalciferol (VITAMIN D) 2000 units CAPS Take 1 capsule (2,000 Units total) by mouth daily. 02/17/15   Plonk, Gwyndolyn Saxon, MD  diphenoxylate-atropine (LOMOTIL) 2.5-0.025 MG tablet Take 1 tablet by mouth 4 (four) times daily as needed for diarrhea or loose stools. 05/08/21   Glean Hess, MD  Dulaglutide (TRULICITY) A999333 0000000 SOPN INJECT 0.75 MG SUBCUTANEOUSLY ONE TIME PER WEEK 01/22/22   Glean Hess, MD  DULoxetine (CYMBALTA) 60 MG capsule Take 1 capsule (60 mg total) by mouth 2 (two) times daily. 01/29/22   Halina Maidens  H, MD  glucose blood test strip 1 each by Other route daily. Use as instructed    [provider]  Icosapent Ethyl 0.5 g CAPS TAKE 2 CAPSULES BY MOUTH TWICE A DAY 02/14/22   Glean Hess, MD  lansoprazole (PREVACID) 15 MG capsule Take 15 mg by mouth daily at 12 noon.    [provider]  losartan (COZAAR) 100 MG tablet TAKE 1 TABLET BY MOUTH EVERY DAY 01/22/22   Glean Hess, MD  metFORMIN (GLUCOPHAGE-XR) 500 MG 24 hr tablet Take 1 tablet (500 mg total) by mouth daily with breakfast. 11/30/21   Glean Hess, MD  metoprolol succinate (TOPROL-XL) 100 MG 24  hr tablet TAKE 0.5 TABLETS (50 MG TOTAL) BY MOUTH IN THE MORNING AND AT BEDTIME. 01/22/22   Glean Hess, MD  Multiple Vitamins-Minerals (MULTIVITAMIN WITH MINERALS) tablet Take 1 tablet by mouth daily.    [provider]  ondansetron (ZOFRAN-ODT) 4 MG disintegrating tablet Take 1 tablet (4 mg total) by mouth every 6 (six) hours as needed for nausea or vomiting. 05/06/21   Delman Kitten, MD  valACYclovir (VALTREX) 1000 MG tablet TAKE 1 TABLET BY MOUTH TWICE A DAY 03/22/21   Glean Hess, MD    Family History Family History  Problem Relation Age of Onset   Hypertension Mother    Stroke Mother    Hyperlipidemia Mother    Heart disease Mother        heart attack   Diabetes Father    Cancer Father        lung   Diabetes Sister    Hyperlipidemia Sister    Stroke Paternal Grandfather    Hypertension Paternal Grandfather    Stroke Maternal Grandfather    Alzheimer's disease Paternal Grandmother    Diabetes Sister    Cancer Sister        breast   Breast cancer Sister 24   Breast cancer Paternal Aunt     Social History Social History   Tobacco Use   Smoking status: Former    Types: Cigarettes    Quit date: 06/14/1984    Years since quitting: 37.8   Smokeless tobacco: Never  Vaping Use   Vaping Use: Never used  Substance Use Topics   Alcohol use: No    Alcohol/week: 0.0 standard drinks of alcohol   Drug use: No     Allergies   Rabeprazole sodium, Augmentin [amoxicillin-pot clavulanate], Benazepril hcl, Penicillins, and Jardiance [empagliflozin]   Review of Systems Review of Systems  Constitutional:  Positive for chills and fever. Negative for activity change, appetite change, diaphoresis, fatigue and unexpected weight change.  HENT:  Positive for congestion, ear pain, rhinorrhea and sore throat. Negative for dental problem, drooling, ear discharge, facial swelling, hearing loss, mouth sores, nosebleeds, postnasal drip, sinus pressure, sinus pain, sneezing,  tinnitus, trouble swallowing and voice change.   Respiratory:  Positive for cough and wheezing. Negative for apnea, choking, chest tightness, shortness of breath and stridor.   Cardiovascular: Negative.   Gastrointestinal: Negative.   Musculoskeletal:  Positive for myalgias. Negative for arthralgias, back pain, gait problem, joint swelling, neck pain and neck stiffness.  Skin: Negative.   Neurological:  Positive for headaches. Negative for dizziness, tremors, seizures, syncope, facial asymmetry, speech difficulty, weakness, light-headedness and numbness.     Physical Exam Triage Vital Signs ED Triage Vitals  Enc Vitals Group     BP 03/27/22 1819 (!) 138/94     Pulse Rate 03/27/22 1819 82  Resp 03/27/22 1819 20     Temp 03/27/22 1819 98.7 F (37.1 C)     Temp Source 03/27/22 1819 Oral     SpO2 03/27/22 1819 100 %     Weight --      Height --      Head Circumference --      Peak Flow --      Pain Score 03/27/22 1816 8     Pain Loc --      Pain Edu? --      Excl. in [place]? --    No data found.  Updated Vital Signs BP (!) 138/94 (BP Location: Right Arm)   Pulse 82   Temp 98.7 F (37.1 C) (Oral)   Resp 20   LMP  (LMP Unknown)   SpO2 100%   Visual Acuity Right Eye Distance:   Left Eye Distance:   Bilateral Distance:    Right Eye Near:   Left Eye Near:    Bilateral Near:     Physical Exam Constitutional:      Appearance: Normal appearance.  HENT:     Head: Normocephalic.     Right Ear: Tympanic membrane, ear canal and external ear normal.     Left Ear: Tympanic membrane, ear canal and external ear normal.     Nose: Congestion and rhinorrhea present.     Mouth/Throat:     Mouth: Mucous membranes are moist.     Pharynx: No posterior oropharyngeal erythema.  Cardiovascular:     Rate and Rhythm: Normal rate and regular rhythm.     Pulses: Normal pulses.     Heart sounds: Normal heart sounds.  Pulmonary:     Effort: Pulmonary effort is normal.     Breath sounds:  Normal breath sounds.  Skin:    General: Skin is warm and dry.  Neurological:     Mental Status: She is alert and oriented to person, place, and time. Mental status is at baseline.  Psychiatric:        Mood and Affect: Mood normal.        Behavior: Behavior normal.      UC Treatments / Results  Labs (all labs ordered are listed, but only abnormal results are displayed) Labs Reviewed - No data to display  EKG   Radiology No results found.  Procedures Procedures (including critical care time)  Medications Ordered in UC Medications - No data to display  Initial Impression / Assessment and Plan / UC Course  I have reviewed the triage vital signs and the nursing notes.  Pertinent labs & imaging results that were available during my care of the patient were reviewed by me and considered in my medical decision making (see chart for details).  Viral URI with cough  Patient is in no signs of distress nor toxic appearing.  Vital signs are stable.  Low suspicion for pneumonia, pneumothorax or bronchitis and therefore will defer imaging.  Declined viral testing.  Prescribed prednisone, guaifenesin codeine and Tessalon as cough is most worrisome symptom, on exam, persistent harsh dry cough is witnessed.  Watchful wait antibiotic placed at pharmacy if no improvement seen.May use additional over-the-counter medications as needed for supportive care.  May follow-up with urgent care as needed if symptoms persist or worsen.  Final Clinical Impressions(s) / UC Diagnoses   Final diagnoses:  None   Discharge Instructions   None    ED Prescriptions   None    PDMP not reviewed this encounter.  Hans Eden, NP 03/27/22 1900

## 2022-03-27 NOTE — ED Triage Notes (Signed)
Reports symptoms started Sunday evening.  Complains of headache, bilateral ear pain, neck pain, stomach soreness , coughing, fever, sneezing, runny nose and body aches.    Has taken tussin for coughing, advil and delsym

## 2022-03-27 NOTE — Discharge Instructions (Addendum)
Your symptoms today are most likely being caused by a virus and should steadily improve in time it can take up to 7 to 10 days before you truly start to see a turnaround however things will get better if no improvement in your symptoms by Sunday, February 18 you may begin use of azithromycin to provide coverage for bacteria  Starting tomorrow take prednisone every morning with food for 5 days to reduce the harshness your cough as well as help to reduce wheezing  You may use Tessalon take 1 to 2 tablets every 8 hours for management of your coughing  You may use cough syrup every 6 hours as needed for additional comfort, be mindful this can make you feel drowsy  May continue use of over-the-counter Delsym in addition to these medications above    You can take Tylenol and/or Ibuprofen as needed for fever reduction and pain relief.   For cough: honey 1/2 to 1 teaspoon (you can dilute the honey in water or another fluid).  You can also use guaifenesin and dextromethorphan for cough. You can use a humidifier for chest congestion and cough.  If you don't have a humidifier, you can sit in the bathroom with the hot shower running.      For sore throat: try warm salt water gargles, cepacol lozenges, throat spray, warm tea or water with lemon/honey, popsicles or ice, or OTC cold relief medicine for throat discomfort.   For congestion: take a daily anti-histamine like Zyrtec, Claritin, and a oral decongestant, such as pseudoephedrine.  You can also use Flonase 1-2 sprays in each nostril daily.   It is important to stay hydrated: drink plenty of fluids (water, gatorade/powerade/pedialyte, juices, or teas) to keep your throat moisturized and help further relieve irritation/discomfort.

## 2022-04-02 ENCOUNTER — Ambulatory Visit: Payer: Managed Care, Other (non HMO) | Admitting: Internal Medicine

## 2022-04-02 ENCOUNTER — Encounter: Payer: Self-pay | Admitting: Internal Medicine

## 2022-04-02 VITALS — BP 122/70 | HR 68 | Ht 60.0 in | Wt 120.6 lb

## 2022-04-02 DIAGNOSIS — E1169 Type 2 diabetes mellitus with other specified complication: Secondary | ICD-10-CM

## 2022-04-02 DIAGNOSIS — F324 Major depressive disorder, single episode, in partial remission: Secondary | ICD-10-CM | POA: Diagnosis not present

## 2022-04-02 DIAGNOSIS — E785 Hyperlipidemia, unspecified: Secondary | ICD-10-CM

## 2022-04-02 DIAGNOSIS — I10 Essential (primary) hypertension: Secondary | ICD-10-CM | POA: Diagnosis not present

## 2022-04-02 DIAGNOSIS — E118 Type 2 diabetes mellitus with unspecified complications: Secondary | ICD-10-CM | POA: Diagnosis not present

## 2022-04-02 MED ORDER — FREESTYLE LIBRE 3 SENSOR MISC: 1.0000 | 1 refills | Status: DC

## 2022-04-02 NOTE — Assessment & Plan Note (Addendum)
Clinically stable without s/s of hypoglycemia. Tolerating Trulicity and metfromin well without side effects or other concerns. Lab Results  Component Value Date   HGBA1C 7.0 (A) 11/29/2021  Will start FreeStyle Libre - sample given and Rx sent.

## 2022-04-02 NOTE — Assessment & Plan Note (Addendum)
On statins with LDL 59 but high Triglycerides Advised pt to try Vascepa again 4 months ago (had caused diarrhea) but unable to tolerate it. Will continue Atorvastatin alone

## 2022-04-02 NOTE — Assessment & Plan Note (Addendum)
Moderate symptoms on Cymbalta and Buspar Cymbalta dose increased last visit to 120 mg per day and doing much better. Continue current regimen.

## 2022-04-02 NOTE — Assessment & Plan Note (Signed)
Clinically stable exam with well controlled BP on amlodipine, losartan, metoprolol. Tolerating medications without side effects. Pt to continue current regimen and low sodium diet.

## 2022-04-02 NOTE — Progress Notes (Signed)
Date:  04/02/2022   Name:  Tracy Bryan   DOB:  November 28, 1960   MRN:  [id number]   Chief Complaint: Diabetes and Depression  Diabetes She presents for her follow-up diabetic visit. She has type 2 diabetes mellitus. Her disease course has been stable. Hypoglycemia symptoms include sweats. Pertinent negatives for hypoglycemia include no dizziness or headaches. Pertinent negatives for diabetes include no chest pain, no fatigue and no weakness. There are no hypoglycemic complications. An ACE inhibitor/angiotensin II receptor blocker is being taken. Eye exam is current.  Hypertension This is a chronic problem. The problem is controlled. Associated symptoms include sweats. Pertinent negatives include no chest pain, headaches, palpitations or shortness of breath. Past treatments include calcium channel blockers, beta blockers and angiotensin blockers. The current treatment provides significant improvement.  Hyperlipidemia This is a chronic problem. The problem is uncontrolled. Recent lipid tests were reviewed and are high. Pertinent negatives include no chest pain or shortness of breath. Current antihyperlipidemic treatment includes statins (lipitor 40 mg; resumed Vascepa for Trigs).    Lab Results  Component Value Date   NA 139 07/26/2021   K 5.6 (H) 07/26/2021   CO2 23 07/26/2021   GLUCOSE 112 (H) 07/26/2021   BUN 20 07/26/2021   CREATININE 0.90 07/26/2021   CALCIUM 10.6 (H) 07/26/2021   EGFR 72 07/26/2021   GFRNONAA 51 (L) 05/06/2021   Lab Results  Component Value Date   CHOL 158 07/26/2021   HDL 47 07/26/2021   LDLCALC 59 07/26/2021   TRIG 337 (H) 07/26/2021   CHOLHDL 3.4 07/26/2021   Lab Results  Component Value Date   TSH 0.936 07/26/2021   Lab Results  Component Value Date   HGBA1C 7.0 (A) 11/29/2021   Lab Results  Component Value Date   WBC 7.5 07/26/2021   HGB 13.6 07/26/2021   HCT 40.0 07/26/2021   MCV 87 07/26/2021   PLT 312 07/26/2021   Lab Results   Component Value Date   ALT 24 07/26/2021   AST 18 07/26/2021   ALKPHOS 67 07/26/2021   BILITOT 0.4 07/26/2021   Lab Results  Component Value Date   VD25OH 52.8 05/28/2017     Review of Systems  Constitutional:  Negative for fatigue and unexpected weight change.  HENT:  Negative for nosebleeds.   Eyes:  Negative for visual disturbance.  Respiratory:  Negative for cough, chest tightness, shortness of breath and wheezing.   Cardiovascular:  Negative for chest pain, palpitations and leg swelling.  Gastrointestinal:  Negative for abdominal pain, constipation and diarrhea.  Neurological:  Negative for dizziness, weakness, light-headedness and headaches.    Patient Active Problem List   Diagnosis Date Noted   Major depressive disorder with single episode, in partial remission ([place]) 04/02/2022   Muscle tension headache 03/30/2020   Status post total hysterectomy 03/23/2020   Sleep disorder 11/10/2019   Bilateral carpal tunnel syndrome 11/07/2016   Herpes simplex infection 11/23/2015   Fibromyalgia 03/11/2015   Menopause syndrome 02/17/2015   Esophageal reflux 02/17/2015   Vitamin D deficiency 02/17/2015   Hyperlipidemia associated with type 2 diabetes mellitus ([place]) 07/28/2014   Essential hypertension 07/28/2014   Type II diabetes mellitus with complication ([facility]) 99991111    Allergies  Allergen Reactions   Rabeprazole Sodium     C Diff Side Affects   Augmentin [Amoxicillin-Pot Clavulanate] Itching   Benazepril Hcl Cough   Penicillins Other (See Comments)   Jardiance [Empagliflozin] Rash    Recurrent yeast vaginitis  Past Surgical History:  Procedure Laterality Date   CERVICAL DISC SURGERY     COLONOSCOPY  07/22/2013   DILATION AND CURETTAGE OF UTERUS  1996   HERNIA REPAIR  [id number]   Umblical   HIP ARTHROPLASTY Right    TOTAL ABDOMINAL HYSTERECTOMY     TOTAL HIP ARTHROPLASTY Left 05/18/2014   Procedure: LEFT TOTAL HIP ARTHROPLASTY ANTERIOR APPROACH;  Surgeon:  Melrose Nakayama, MD;  Location: [place];  Service: Orthopedics;  Laterality: Left;    Social History   Tobacco Use   Smoking status: Former    Types: Cigarettes    Quit date: 06/14/1984    Years since quitting: 37.8   Smokeless tobacco: Never  Vaping Use   Vaping Use: Never used  Substance Use Topics   Alcohol use: No    Alcohol/week: 0.0 standard drinks of alcohol   Drug use: No     Medication list has been reviewed and updated.  Current Meds  Medication Sig   amLODipine (NORVASC) 5 MG tablet TAKE 1 TABLET (5 MG TOTAL) BY MOUTH DAILY.   aspirin 81 MG tablet Take 81 mg by mouth daily.   atorvastatin (LIPITOR) 40 MG tablet TAKE 1 TABLET BY MOUTH EVERY DAY   busPIRone (BUSPAR) 10 MG tablet TAKE 1 TABLET BY MOUTH THREE TIMES A DAY   Cholecalciferol (VITAMIN D) 2000 units CAPS Take 1 capsule (2,000 Units total) by mouth daily.   Continuous Blood Gluc Sensor (FREESTYLE LIBRE 3 SENSOR) MISC 1 each by Does not apply route every 14 (fourteen) days. Place 1 sensor on the skin every 14 days. Use to check glucose continuously   Dulaglutide (TRULICITY) A999333 0000000 SOPN INJECT 0.75 MG SUBCUTANEOUSLY ONE TIME PER WEEK   DULoxetine (CYMBALTA) 60 MG capsule Take 1 capsule (60 mg total) by mouth 2 (two) times daily.   lansoprazole (PREVACID) 15 MG capsule Take 15 mg by mouth daily at 12 noon.   losartan (COZAAR) 100 MG tablet TAKE 1 TABLET BY MOUTH EVERY DAY   metFORMIN (GLUCOPHAGE-XR) 500 MG 24 hr tablet Take 1 tablet (500 mg total) by mouth daily with breakfast.   metoprolol succinate (TOPROL-XL) 100 MG 24 hr tablet TAKE 0.5 TABLETS (50 MG TOTAL) BY MOUTH IN THE MORNING AND AT BEDTIME.   Multiple Vitamins-Minerals (MULTIVITAMIN WITH MINERALS) tablet Take 1 tablet by mouth daily.   valACYclovir (VALTREX) 1000 MG tablet TAKE 1 TABLET BY MOUTH TWICE A DAY       04/02/2022   10:41 AM 11/29/2021    8:05 AM 07/26/2021    9:42 AM 05/08/2021    2:08 PM  GAD 7 : Generalized Anxiety Score  Nervous,  Anxious, on Edge 1 3 2 2  $ Control/stop worrying 2 3 2 2  $ Worry too much - different things 2 3 2 2  $ Trouble relaxing 1 2 1 2  $ Restless 1 3 2 2  $ Easily annoyed or irritable 1 3 2 2  $ Afraid - awful might happen 0 1 1 1  $ Total GAD 7 Score 8 18 12 13  $ Anxiety Difficulty Somewhat difficult Very difficult Very difficult Somewhat difficult       04/02/2022   10:41 AM 11/29/2021    8:05 AM 07/26/2021    9:42 AM  Depression screen PHQ 2/9  Decreased Interest 1 2 1  $ Down, Depressed, Hopeless 1 1 0  PHQ - 2 Score 2 3 1  $ Altered sleeping 2 3 2  $ Tired, decreased energy 1 3   Change in appetite 0 3  0  Feeling bad or failure about yourself  0 1 1  Trouble concentrating 1 2 0  Moving slowly or fidgety/restless 1 1 1  $ Suicidal thoughts 0 0 0  PHQ-9 Score 7 16 5  $ Difficult doing work/chores Somewhat difficult Very difficult Not difficult at all    BP Readings from Last 3 Encounters:  04/02/22 122/70  03/27/22 (!) 138/94  11/29/21 128/72    Physical Exam Vitals and nursing note reviewed.  Constitutional:      General: She is not in acute distress.    Appearance: She is well-developed.  HENT:     Head: Normocephalic and atraumatic.  Cardiovascular:     Rate and Rhythm: Normal rate and regular rhythm.     Pulses: Normal pulses.     Heart sounds: No murmur heard. Pulmonary:     Effort: Pulmonary effort is normal. No respiratory distress.     Breath sounds: No wheezing or rhonchi.  Musculoskeletal:     Cervical back: Normal range of motion.     Right lower leg: No edema.     Left lower leg: No edema.  Lymphadenopathy:     Cervical: No cervical adenopathy.  Skin:    General: Skin is warm and dry.     Capillary Refill: Capillary refill takes less than 2 seconds.     Findings: No rash.  Neurological:     General: No focal deficit present.     Mental Status: She is alert and oriented to person, place, and time.  Psychiatric:        Mood and Affect: Mood normal.        Behavior:  Behavior normal.     Wt Readings from Last 3 Encounters:  04/02/22 120 lb 9.6 oz (54.7 kg)  11/29/21 124 lb (56.2 kg)  07/26/21 118 lb 9.6 oz (53.8 kg)    BP 122/70   Pulse 68   Ht 5' (1.524 m)   Wt 120 lb 9.6 oz (54.7 kg)   LMP  (LMP Unknown)   SpO2 99%   BMI 23.55 kg/m   Assessment and Plan: Problem List Items Addressed This Visit       Cardiovascular and Mediastinum   Essential hypertension (Chronic)    Clinically stable exam with well controlled BP on amlodipine, losartan, metoprolol. Tolerating medications without side effects. Pt to continue current regimen and low sodium diet.         Endocrine   Hyperlipidemia associated with type 2 diabetes mellitus (HCC) (Chronic)    On statins with LDL 59 but high Triglycerides Advised pt to try Vascepa again 4 months ago (had caused diarrhea) but unable to tolerate it. Will continue Atorvastatin alone      Type II diabetes mellitus with complication ([facility]CC) - Primary (Chronic)    Clinically stable without s/s of hypoglycemia. Tolerating Trulicity and metfromin well without side effects or other concerns. Lab Results  Component Value Date   HGBA1C 7.0 (A) 11/29/2021  Will start FreeStyle Libre - sample given and Rx sent.       Relevant Medications   Continuous Blood Gluc Sensor (FREESTYLE LIBRE 3 SENSOR) MISC   Other Relevant Orders   Basic metabolic panel   Hemoglobin A1c   Microalbumin / creatinine urine ratio     Other   Major depressive disorder with single episode, in partial remission ([facility]) (Chronic)    Moderate symptoms on Cymbalta and Buspar Cymbalta dose increased last visit to 120 mg per day and doing much  better. Continue current regimen.        Partially dictated using Editor, commissioning. Any errors are unintentional.  Halina Maidens, MD [facility]  04/02/2022

## 2022-04-03 LAB — MICROALBUMIN / CREATININE URINE RATIO
Creatinine, Urine: 166.6 mg/dL
Microalb/Creat Ratio: 120 mg/g creat — ABNORMAL HIGH (ref 0–29)
Microalbumin, Urine: 200.7 ug/mL

## 2022-04-03 LAB — BASIC METABOLIC PANEL
BUN/Creatinine Ratio: 15 (ref 12–28)
BUN: 13 mg/dL (ref 8–27)
CO2: 25 mmol/L (ref 20–29)
Calcium: 10.1 mg/dL (ref 8.7–10.3)
Chloride: 101 mmol/L (ref 96–106)
Creatinine, Ser: 0.86 mg/dL (ref 0.57–1.00)
Glucose: 114 mg/dL — ABNORMAL HIGH (ref 70–99)
Potassium: 5.1 mmol/L (ref 3.5–5.2)
Sodium: 139 mmol/L (ref 134–144)
eGFR: 76 mL/min/{1.73_m2} (ref >59)

## 2022-04-03 LAB — HEMOGLOBIN A1C
Est. average glucose Bld gHb Est-mCnc: 157 mg/dL
Hgb A1c MFr Bld: 7.1 % — ABNORMAL HIGH (ref 4.8–5.6)

## 2022-04-04 ENCOUNTER — Ambulatory Visit
Admission: RE | Admit: 2022-04-04 | Discharge: 2022-04-04 | Disposition: A | Discharge status: Home / Self Care | Payer: Managed Care, Other (non HMO) | Source: Ambulatory Visit | Attending: Internal Medicine | Admitting: Internal Medicine

## 2022-04-04 DIAGNOSIS — Z1231 Encounter for screening mammogram for malignant neoplasm of breast: Secondary | ICD-10-CM | POA: Insufficient documentation

## 2022-05-05 IMAGING — MG MM DIGITAL SCREENING BILAT W/ TOMO AND CAD
8 series · 8 of 24 positions shown · non-contrast
Comparison: Previous exam(s).

CLINICAL DATA: Screening.

EXAM:
DIGITAL SCREENING BILATERAL MAMMOGRAM WITH TOMOSYNTHESIS AND CAD
TECHNIQUE: Bilateral screening digital craniocaudal and mediolateral oblique
mammograms were obtained. Bilateral screening digital breast
tomosynthesis was performed. The images were evaluated with
computer-aided detection.

[L MLO synth-2D]
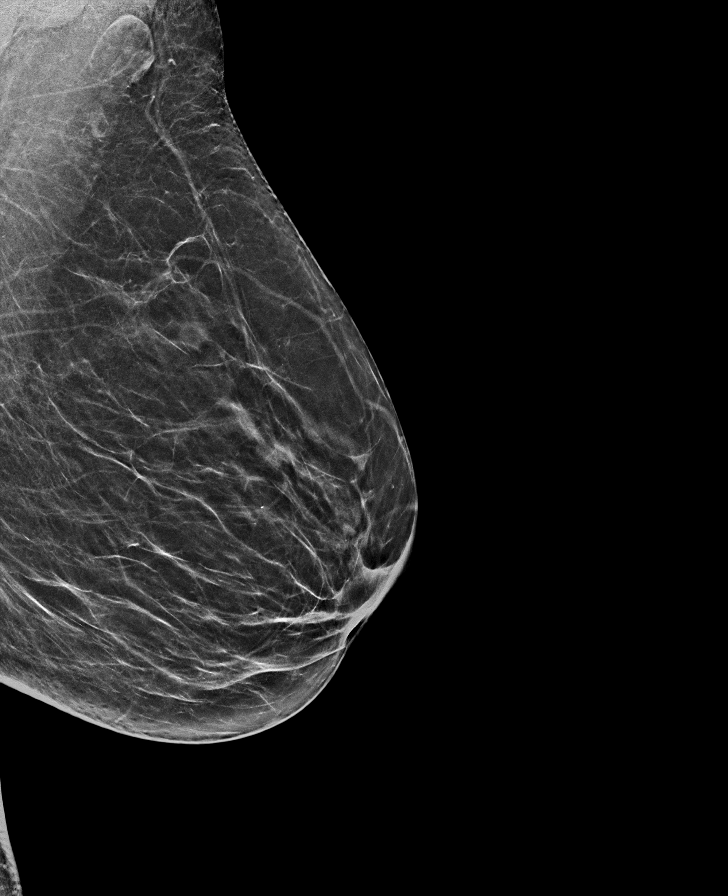

[R MLO synth-2D]
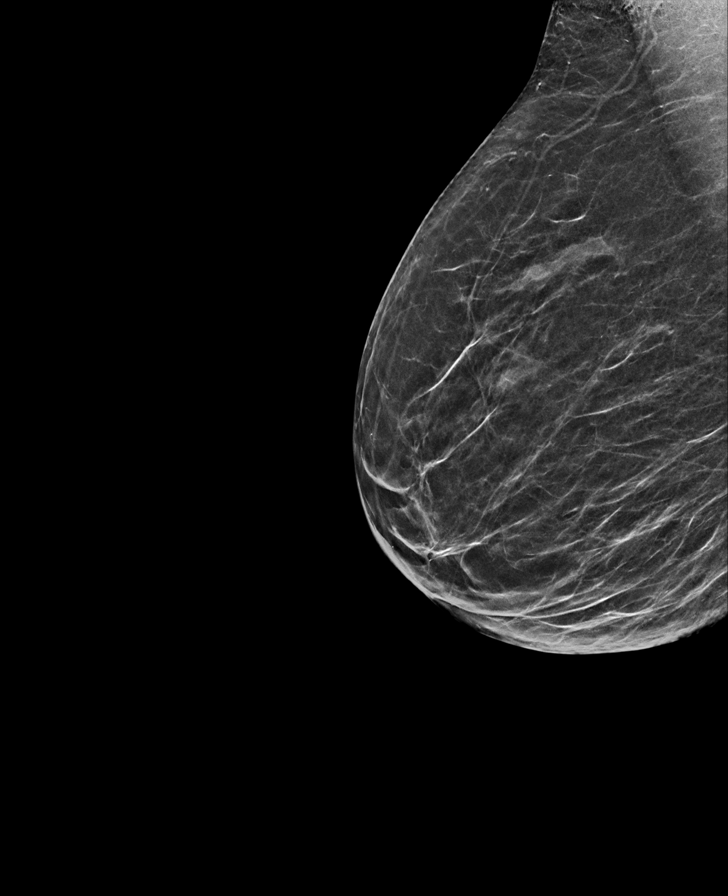

[R CC synth-2D]
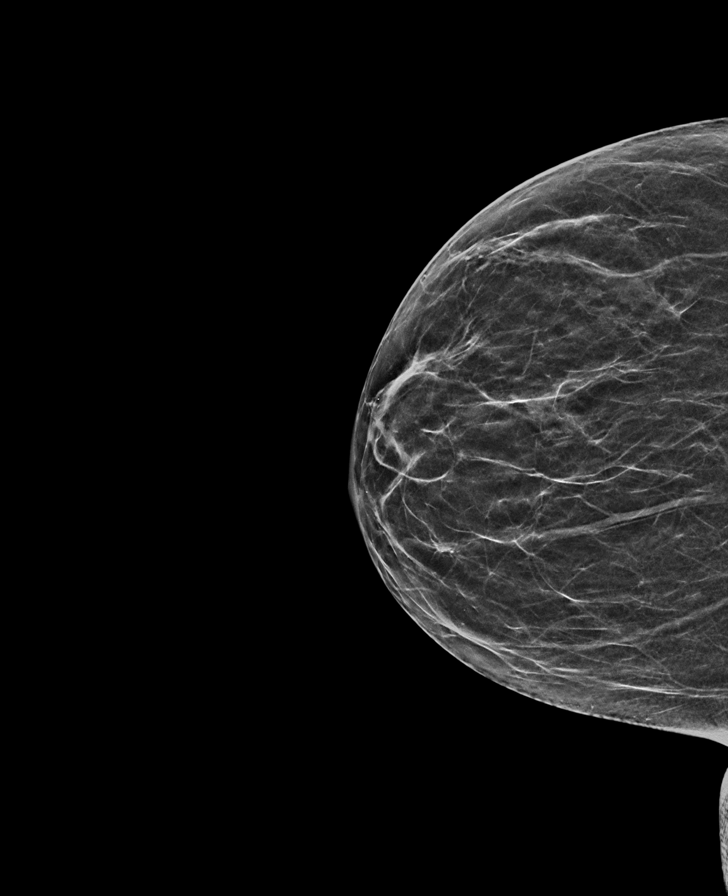

[L CC synth-2D]
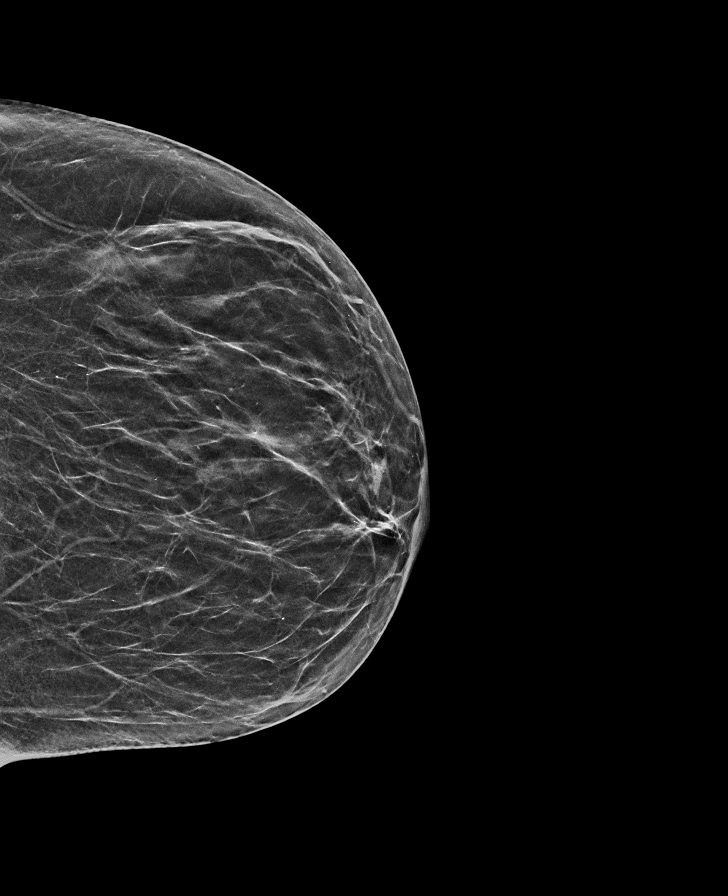

[L CC tomo · tomo slice 25/50.0]
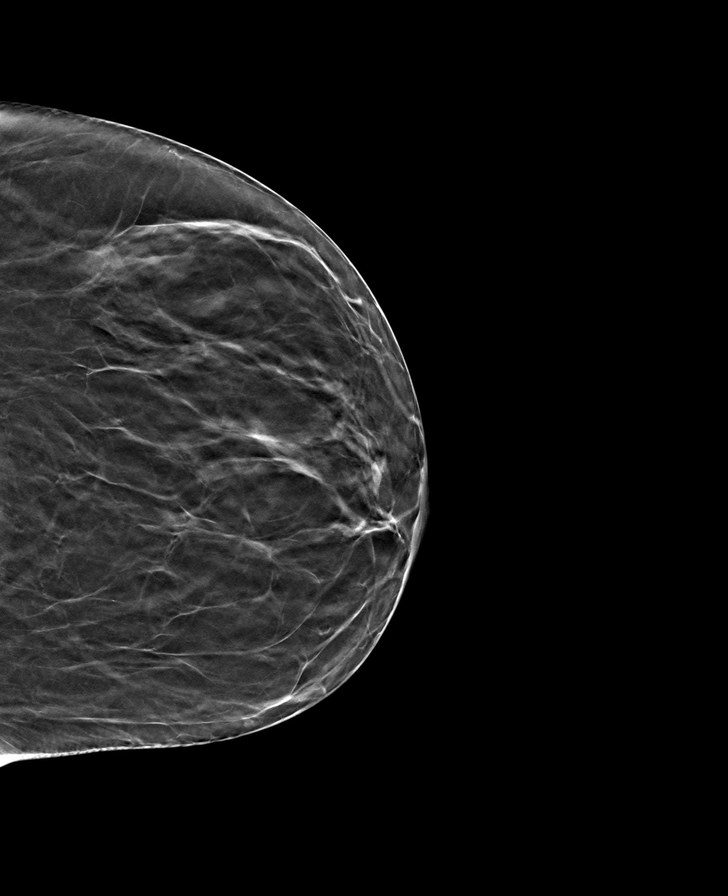

[R MLO tomo · tomo slice 26/51.0]
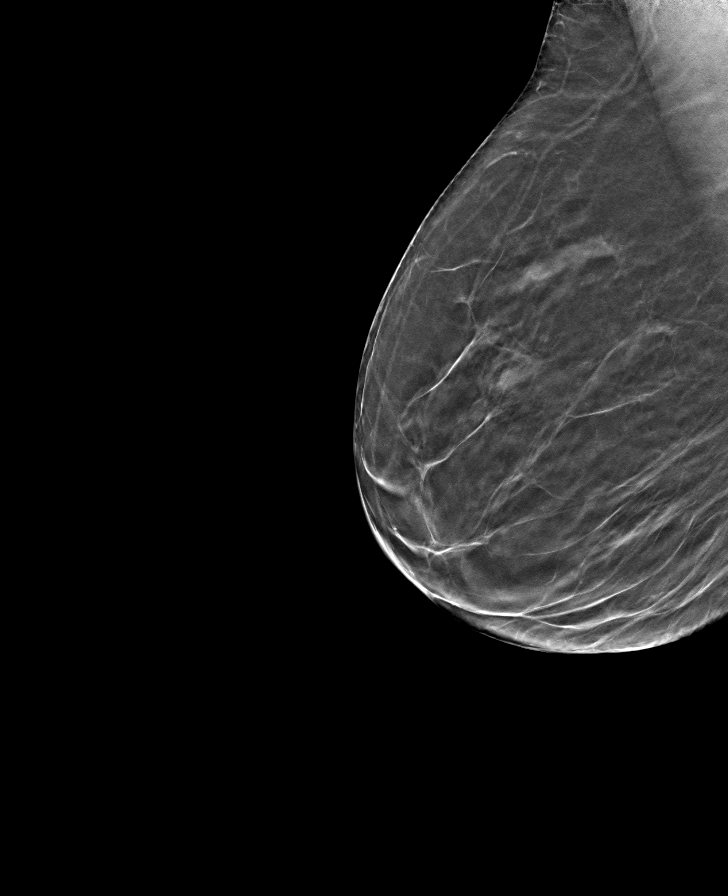

[L MLO tomo · tomo slice 29/57.0]
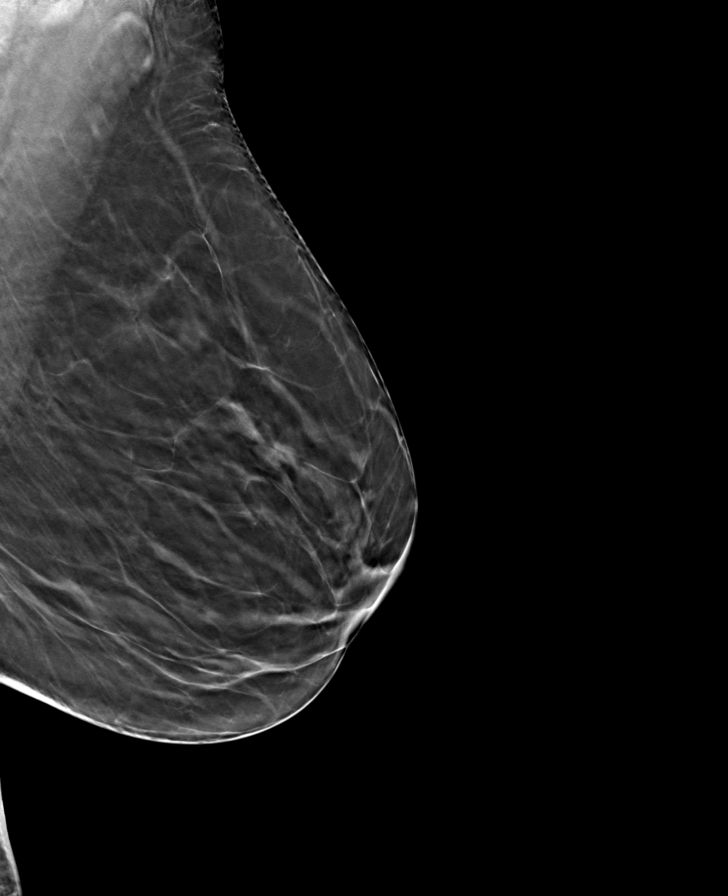

[R CC tomo · tomo slice 24/47.0]
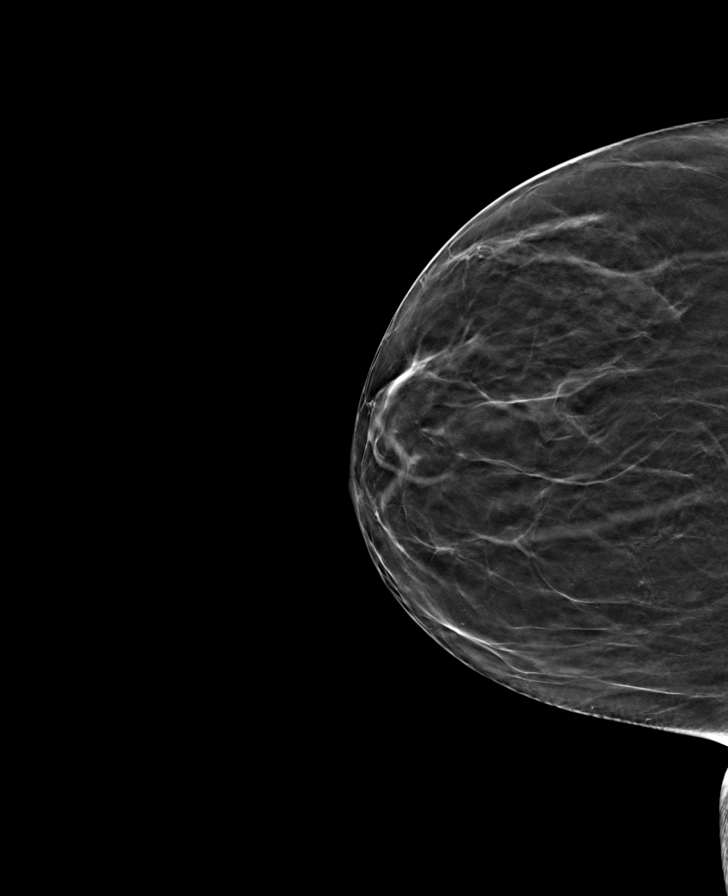

[8 of 24 positions shown; findings below may reference images not displayed]

ACR Breast Density Category b: There are scattered areas of
fibroglandular density.
FINDINGS: In the left breast, a possible asymmetry warrants further
evaluation. In the right breast, no findings suspicious for
malignancy. The images were evaluated with computer-aided detection.
IMPRESSION: Further evaluation is suggested for possible asymmetry in the left
breast.

RECOMMENDATION:
Diagnostic mammogram and possibly ultrasound of the left breast.
(Code:9X-A-SS4)

The patient will be contacted regarding the findings, and additional
imaging will be scheduled.

BI-RADS CATEGORY  0: Incomplete. Need additional imaging evaluation
and/or prior mammograms for comparison.

## 2022-05-19 ENCOUNTER — Other Ambulatory Visit: Payer: Self-pay | Admitting: Internal Medicine

## 2022-05-19 DIAGNOSIS — F411 Generalized anxiety disorder: Secondary | ICD-10-CM

## 2022-05-19 DIAGNOSIS — E118 Type 2 diabetes mellitus with unspecified complications: Secondary | ICD-10-CM

## 2022-05-19 DIAGNOSIS — I10 Essential (primary) hypertension: Secondary | ICD-10-CM

## 2022-05-19 DIAGNOSIS — E1169 Type 2 diabetes mellitus with other specified complication: Secondary | ICD-10-CM

## 2022-05-21 NOTE — Telephone Encounter (Signed)
Requested Prescriptions  Pending Prescriptions Disp Refills   amLODipine (NORVASC) 5 MG tablet [Pharmacy Med Name: AMLODIPINE BESYLATE 5 MG TAB] 90 tablet 0    Sig: TAKE 1 TABLET (5 MG TOTAL) BY MOUTH DAILY.     Cardiovascular: Calcium Channel Blockers 2 Passed - 05/19/2022 10:15 AM      Passed - Last BP in normal range    BP Readings from Last 1 Encounters:  04/02/22 122/70         Passed - Last Heart Rate in normal range    Pulse Readings from Last 1 Encounters:  04/02/22 68         Passed - Valid encounter within last 6 months    Recent Outpatient Visits           1 month ago Type II diabetes mellitus with complication [facility])   [place] [facility] at [facility], Nyoka Cowden, MD   5 months ago Type II diabetes mellitus with complication [facility])   [place] [facility] at [facility], Nyoka Cowden, MD   9 months ago Annual physical exam   [facility] at [facility], Nyoka Cowden, MD   1 year ago Diarrhea, unspecified type   [facility] at [facility], Nyoka Cowden, MD   1 year ago Essential hypertension   [place] [facility] at [facility], Nyoka Cowden, MD       Future Appointments             In 2 months Reubin Milan, MD [facility] at [facility], [facility]             metoprolol succinate (TOPROL-XL) 100 MG 24 hr tablet [Pharmacy Med Name: METOPROLOL SUCC ER 100 MG TAB] 90 tablet 0    Sig: TAKE 0.5 TABLETS (50 MG TOTAL) BY MOUTH IN THE MORNING AND AT BEDTIME.     Cardiovascular:  Beta Blockers Passed - 05/19/2022 10:15 AM      Passed - Last BP in normal range    BP Readings from Last 1 Encounters:  04/02/22 122/70         Passed - Last Heart Rate in normal range    Pulse Readings from Last 1 Encounters:  04/02/22 68         Passed - Valid encounter  within last 6 months    Recent Outpatient Visits           1 month ago Type II diabetes mellitus with complication [facility])   [place] [facility] at [facility], Nyoka Cowden, MD   5 months ago Type II diabetes mellitus with complication [facility])   [place] [facility] at [facility], Nyoka Cowden, MD   9 months ago Annual physical exam   [facility] at [facility], Nyoka Cowden, MD   1 year ago Diarrhea, unspecified type   [facility] at [facility], Nyoka Cowden, MD   1 year ago Essential hypertension    Primary Care & Sports Medicine at [facility], Nyoka Cowden, MD       Future Appointments             In 2 months Reubin Milan, MD [facility] at [facility], [facility]             TRULICITY 0.75 MG/0.5ML SOPN [Pharmacy Med Name: TRULICITY 0.75 MG/0.5 ML PEN] 6 mL 0    Sig: INJECT 0.75 MG SUBCUTANEOUSLY ONE TIME PER WEEK     Endocrinology:  Diabetes - GLP-1 Receptor Agonists Passed - 05/19/2022 10:15 AM      Passed - HBA1C is between 0 and 7.9 and within 180 days    HB A1C (BAYER DCA - WAIVED)  Date Value Ref Range Status  07/28/2014 6.9 <7.0 % Final    Comment:                                          Diabetic Adult            <7.0                                       Healthy Adult        4.3 - 5.7                                                           (DCCT/NGSP) American Diabetes Association's Summary of Glycemic Recommendations for Adults with Diabetes: Hemoglobin A1c <7.0%. More stringent glycemic goals (A1c <6.0%) may further reduce complications at the cost of increased risk of hypoglycemia.    Hgb A1c MFr Bld  Date Value Ref Range Status  04/02/2022 7.1 (H) 4.8 - 5.6 % Final    Comment:             Prediabetes: 5.7 - 6.4          Diabetes: >6.4          Glycemic  control for adults with diabetes: <7.0          Passed - Valid encounter within last 6 months    Recent Outpatient Visits           1 month ago Type II diabetes mellitus with complication [facility])   [place] [facility] at [facility], Nyoka Cowden, MD   5 months ago Type II diabetes mellitus with complication [facility])   [place] [facility] at [facility], Nyoka Cowden, MD   9 months ago Annual physical exam   [facility] at [facility], Nyoka Cowden, MD   1 year ago Diarrhea, unspecified type   [facility] Primary Care & Sports Medicine at [facility], Nyoka Cowden, MD   1 year ago Essential hypertension   [place] [facility] Medicine at [facility], Nyoka Cowden, MD       Future Appointments             In 2 months Judithann Graves Nyoka Cowden, MD [facility] Primary Care & Sports [facility] at [facility], [facility]             busPIRone (BUSPAR) 10 MG tablet [Pharmacy Med Name: BUSPIRONE HCL 10 MG TABLET] 270 tablet 0  Sig: TAKE 1 TABLET BY MOUTH THREE TIMES A DAY     Psychiatry: Anxiolytics/Hypnotics - Non-controlled Passed - 05/19/2022 10:15 AM      Passed - Valid encounter within last 12 months    Recent Outpatient Visits           1 month ago Type II diabetes mellitus with complication [facility](HCC)   [place] [facility] at [facility], Nyoka CowdenLaura H, MD   5 months ago Type II diabetes mellitus with complication [facility](HCC)   [place] [facility], Nyoka CowdenLaura H, MD   9 months ago Annual physical exam   [facility], Nyoka CowdenLaura H, MD   1 year ago Diarrhea, unspecified type   [facility], Nyoka CowdenLaura H, MD   1 year ago Essential hypertension   [place] [facility], Nyoka CowdenLaura H, MD       Future Appointments             In 2 months Reubin MilanBerglund, Laura H, MD [facility], [facility]             losartan (COZAAR) 100 MG tablet [Pharmacy Med Name: LOSARTAN POTASSIUM 100 MG TAB] 90 tablet 0    Sig: TAKE 1 TABLET BY MOUTH EVERY DAY     Cardiovascular:  Angiotensin Receptor Blockers Passed - 05/19/2022 10:15 AM      Passed - Cr in normal range and within 180 days    Creatinine  Date Value Ref Range Status  07/08/2013 0.92 0.60 - 1.30 mg/dL Final   Creatinine, Ser  Date Value Ref Range Status  04/02/2022 0.86 0.57 - 1.00 mg/dL Final         Passed - K in normal range and within 180 days    Potassium  Date Value Ref Range Status  04/02/2022 5.1 3.5 - 5.2 mmol/L Final  07/08/2013 3.5 3.5 - 5.1 mmol/L Final         Passed - Patient is not pregnant      Passed - Last BP in normal range    BP Readings from Last 1 Encounters:  04/02/22 122/70         Passed - Valid encounter within last 6 months    Recent Outpatient Visits           1 month ago Type II diabetes mellitus with complication [facility](HCC)   [place] [facility] at [facility], Nyoka CowdenLaura H, MD   5 months ago Type II diabetes mellitus with complication [facility](HCC)   [place] [facility] at [facility], Nyoka CowdenLaura H, MD   9 months ago Annual physical exam   [facility], Nyoka CowdenLaura H, MD   1 year ago Diarrhea, unspecified type   [facility] at [facility], Nyoka CowdenLaura H, MD   1 year ago Essential hypertension   [place] [facility], Nyoka CowdenLaura H, MD       Future Appointments             In 2 months Judithann GravesBerglund, Nyoka CowdenLaura H, MD [facility] at [facility], [facility]  metFORMIN  (GLUCOPHAGE-XR) 500 MG 24 hr tablet [Pharmacy Med Name: METFORMIN HCL ER 500 MG TABLET] 90 tablet 0    Sig: TAKE 1 TABLET BY MOUTH EVERY DAY WITH BREAKFAST     Endocrinology:  Diabetes - Biguanides Failed - 05/19/2022 10:15 AM      Failed - B12 Level in normal range and within 720 days    No results found for: "VITAMINB12"       Passed - Cr in normal range and within 360 days    Creatinine  Date Value Ref Range Status  07/08/2013 0.92 0.60 - 1.30 mg/dL Final   Creatinine, Ser  Date Value Ref Range Status  04/02/2022 0.86 0.57 - 1.00 mg/dL Final         Passed - HBA1C is between 0 and 7.9 and within 180 days    HB A1C (BAYER DCA - WAIVED)  Date Value Ref Range Status  07/28/2014 6.9 <7.0 % Final    Comment:                                          Diabetic Adult            <7.0                                       Healthy Adult        4.3 - 5.7                                                           (DCCT/NGSP) American Diabetes Association's Summary of Glycemic Recommendations for Adults with Diabetes: Hemoglobin A1c <7.0%. More stringent glycemic goals (A1c <6.0%) may further reduce complications at the cost of increased risk of hypoglycemia.    Hgb A1c MFr Bld  Date Value Ref Range Status  04/02/2022 7.1 (H) 4.8 - 5.6 % Final    Comment:             Prediabetes: 5.7 - 6.4          Diabetes: >6.4          Glycemic control for adults with diabetes: <7.0          Passed - eGFR in normal range and within 360 days    EGFR (African American)  Date Value Ref Range Status  07/08/2013 >60  Final   GFR calc Af Amer  Date Value Ref Range Status  07/21/2019 89 >59 mL/min/1.73 Final    Comment:    **[facility] currently reports eGFR in compliance with the current**   recommendations of the [employer]. [facility] will   update reporting as new guidelines are published from the NKF-ASN   Task force.    EGFR (Non-African Amer.)  Date Value Ref Range Status   07/08/2013 >60  Final    Comment:    eGFR values <92mL/min/1.73 m2 may be an indication of chronic kidney disease (CKD). Calculated eGFR is useful in patients with stable renal function. The eGFR calculation will not be reliable in acutely ill patients when serum creatinine is changing rapidly. It is not useful in  patients on dialysis. The eGFR calculation may not be applicable to patients at the low and high extremes of body sizes, pregnant women, and vegetarians.    GFR, Estimated  Date Value Ref Range Status  05/06/2021 51 (L) >60 mL/min Final    Comment:    (NOTE) Calculated using the CKD-EPI Creatinine Equation (2021)    eGFR  Date Value Ref Range Status  04/02/2022 76 >59 mL/min/1.73 Final         Passed - Valid encounter within last 6 months    Recent Outpatient Visits           1 month ago Type II diabetes mellitus with complication (HCC)   [place] [facility] at [facility], Nyoka Cowden, MD   5 months ago Type II diabetes mellitus with complication [facility])   [place] [facility] at [facility], Nyoka Cowden, MD   9 months ago Annual physical exam   [facility] at [facility], Nyoka Cowden, MD   1 year ago Diarrhea, unspecified type   [facility] Primary Care & Sports Medicine at [facility], Nyoka Cowden, MD   1 year ago Essential hypertension   [place] [facility] Medicine at [facility], Nyoka Cowden, MD       Future Appointments             In 2 months Judithann Graves Nyoka Cowden, MD [facility] Primary Care & Sports Medicine at [facility]            Passed - CBC within normal limits and completed in the last 12 months    WBC  Date Value Ref Range Status  07/26/2021 7.5 3.4 - 10.8 x10E3/uL Final  05/06/2021 9.6 4.0 - 10.5 K/uL Final   RBC  Date Value Ref Range Status  07/26/2021 4.59 3.77 - 5.28 x10E6/uL Final   05/06/2021 4.97 3.87 - 5.11 MIL/uL Final   Hemoglobin  Date Value Ref Range Status  07/26/2021 13.6 11.1 - 15.9 g/dL Final   Hematocrit  Date Value Ref Range Status  07/26/2021 40.0 34.0 - 46.6 % Final   MCHC  Date Value Ref Range Status  07/26/2021 34.0 31.5 - 35.7 g/dL Final  16/11/9602 54.0 30.0 - 36.0 g/dL Final   [facility]  Date Value Ref Range Status  07/26/2021 29.6 26.6 - 33.0 pg Final  05/06/2021 29.0 26.0 - 34.0 pg Final   MCV  Date Value Ref Range Status  07/26/2021 87 79 - 97 fL Final  09/28/2013 85 80 - 100 fL Final   No results found for: "PLTCOUNTKUC", "LABPLAT", "POCPLA" RDW  Date Value Ref Range Status  07/26/2021 13.0 11.7 - 15.4 % Final  09/28/2013 12.9 11.5 - 14.5 % Final         Refused Prescriptions Disp Refills   Icosapent Ethyl 0.5 g CAPS [Pharmacy Med Name: ICOSAPENT ETHYL 500 MG CAPSULE] 360 capsule 0    Sig: TAKE 2 CAPSULES BY MOUTH TWICE A DAY     Off-Protocol Failed - 05/19/2022 10:15 AM      Failed - Medication not assigned to a protocol, review manually.      Passed - Valid encounter within last 12 months    Recent Outpatient Visits           1 month ago Type II diabetes mellitus with complication [facility])   [place] [facility]  Medicine [facility], Nyoka Cowden, MD   5 months ago Type II diabetes mellitus with complication [facility])   [place] [facility] at [facility], Nyoka Cowden, MD   9 months ago Annual physical exam   [facility] Primary [facility] Sports Medicine at [facility], Nyoka Cowden, MD   1 year ago Diarrhea, unspecified type   [facility] at [facility], Nyoka Cowden, MD   1 year ago Essential hypertension   [facility] at [facility], Nyoka Cowden, MD       Future Appointments             In 2 months Judithann Graves, Nyoka Cowden, MD [facility] Primary Care & Sports Medicine at [facility], [facility]

## 2022-06-09 ENCOUNTER — Other Ambulatory Visit: Payer: Self-pay | Admitting: Internal Medicine

## 2022-06-09 DIAGNOSIS — I10 Essential (primary) hypertension: Secondary | ICD-10-CM

## 2022-06-09 DIAGNOSIS — E1169 Type 2 diabetes mellitus with other specified complication: Secondary | ICD-10-CM

## 2022-07-20 ENCOUNTER — Other Ambulatory Visit: Payer: Self-pay | Admitting: Internal Medicine

## 2022-07-20 DIAGNOSIS — F39 Unspecified mood [affective] disorder: Secondary | ICD-10-CM

## 2022-07-20 NOTE — Telephone Encounter (Signed)
Requested medications are due for refill today.  yes  Requested medications are on the active medications list.  yes  Last refill. 01/29/2022 #180 1 rf  Future visit scheduled.   yes  Notes to clinic.  Request sig differs from med list sig. Please review.    Requested Prescriptions  Pending Prescriptions Disp Refills   DULoxetine (CYMBALTA) 60 MG capsule [Pharmacy Med Name: DULOXETINE HCL DR 60 MG CAP] 90 capsule     Sig: TAKE 1 CAPSULE BY MOUTH EVERY DAY     Psychiatry: Antidepressants - SNRI - duloxetine Passed - 07/20/2022  2:34 AM      Passed - Cr in normal range and within 360 days    Creatinine  Date Value Ref Range Status  07/08/2013 0.92 0.60 - 1.30 mg/dL Final   Creatinine, Ser  Date Value Ref Range Status  04/02/2022 0.86 0.57 - 1.00 mg/dL Final         Passed - eGFR is 30 or above and within 360 days    EGFR (African American)  Date Value Ref Range Status  07/08/2013 >60  Final   GFR calc Af Amer  Date Value Ref Range Status  07/21/2019 89 >59 mL/min/1.73 Final    Comment:    **[facility] currently reports eGFR in compliance with the current**   recommendations of the [employer]. [facility] will   update reporting as new guidelines are published from the NKF-ASN   Task force.    EGFR (Non-African Amer.)  Date Value Ref Range Status  07/08/2013 >60  Final    Comment:    eGFR values <76mL/min/1.73 m2 may be an indication of chronic kidney disease (CKD). Calculated eGFR is useful in patients with stable renal function. The eGFR calculation will not be reliable in acutely ill patients when serum creatinine is changing rapidly. It is not useful in  patients on dialysis. The eGFR calculation may not be applicable to patients at the low and high extremes of body sizes, pregnant women, and vegetarians.    GFR, Estimated  Date Value Ref Range Status  05/06/2021 51 (L) >60 mL/min Final    Comment:    (NOTE) Calculated using the CKD-EPI  Creatinine Equation (2021)    eGFR  Date Value Ref Range Status  04/02/2022 76 >59 mL/min/1.73 Final         Passed - Completed PHQ-2 or PHQ-9 in the last 360 days      Passed - Last BP in normal range    BP Readings from Last 1 Encounters:  04/02/22 122/70         Passed - Valid encounter within last 6 months    Recent Outpatient Visits           3 months ago Type II diabetes mellitus with complication [facility])   [place] [facility] Care & Sports Medicine at [facility], Nyoka Cowden, MD   7 months ago Type II diabetes mellitus with complication [facility])   [place] [facility] Sports Medicine at [facility], Nyoka Cowden, MD   11 months ago Annual physical exam   [facility] Primary Care & Sports Medicine at [facility], Nyoka Cowden, MD   1 year ago Diarrhea, unspecified type   [facility] at [facility], Nyoka Cowden, MD   1 year ago Essential hypertension   [facility] at [facility], Nyoka Cowden, MD  Future Appointments             In 1 week Judithann Graves, Nyoka Cowden, MD [facility] Primary Care & Sports Medicine at [facility], [facility]

## 2022-08-01 ENCOUNTER — Ambulatory Visit (INDEPENDENT_AMBULATORY_CARE_PROVIDER_SITE_OTHER): Payer: Managed Care, Other (non HMO) | Admitting: Internal Medicine

## 2022-08-01 ENCOUNTER — Encounter: Payer: Self-pay | Admitting: Internal Medicine

## 2022-08-01 VITALS — BP 128/66 | HR 73 | Ht 64.0 in | Wt 121.2 lb

## 2022-08-01 DIAGNOSIS — Z0000 Encounter for general adult medical examination without abnormal findings: Secondary | ICD-10-CM

## 2022-08-01 DIAGNOSIS — E118 Type 2 diabetes mellitus with unspecified complications: Secondary | ICD-10-CM

## 2022-08-01 DIAGNOSIS — N1831 Chronic kidney disease, stage 3a: Secondary | ICD-10-CM

## 2022-08-01 DIAGNOSIS — K219 Gastro-esophageal reflux disease without esophagitis: Secondary | ICD-10-CM

## 2022-08-01 DIAGNOSIS — Z23 Encounter for immunization: Secondary | ICD-10-CM

## 2022-08-01 DIAGNOSIS — F324 Major depressive disorder, single episode, in partial remission: Secondary | ICD-10-CM | POA: Diagnosis not present

## 2022-08-01 DIAGNOSIS — Z7985 Long-term (current) use of injectable non-insulin antidiabetic drugs: Secondary | ICD-10-CM

## 2022-08-01 DIAGNOSIS — I10 Essential (primary) hypertension: Secondary | ICD-10-CM | POA: Diagnosis not present

## 2022-08-01 DIAGNOSIS — M797 Fibromyalgia: Secondary | ICD-10-CM | POA: Diagnosis not present

## 2022-08-01 DIAGNOSIS — E1169 Type 2 diabetes mellitus with other specified complication: Secondary | ICD-10-CM | POA: Diagnosis not present

## 2022-08-01 DIAGNOSIS — E785 Hyperlipidemia, unspecified: Secondary | ICD-10-CM

## 2022-08-01 MED ORDER — OMEPRAZOLE 20 MG PO CPDR: 20.0000 mg | DELAYED_RELEASE_CAPSULE | Freq: Two times a day (BID) | ORAL | 0 refills | Status: DC

## 2022-08-01 NOTE — Assessment & Plan Note (Signed)
Continuing to monitor regularly.

## 2022-08-01 NOTE — Assessment & Plan Note (Addendum)
Clinically stable on current regimen with fair control of symptoms, No SI or HI. No change in management at this time. Continue Cymbalta 120 mg and Buspar.

## 2022-08-01 NOTE — Assessment & Plan Note (Addendum)
Blood sugars have been up and down over the past few months without hypoglycemic symptoms or events. There is no change in diet, medications or activity. Currently being treated with Metformin and Trulicity. Lab Results  Component Value Date   HGBA1C 7.1 (H) 04/02/2022  Will check labs then advise - may need to increase metformin to bid.

## 2022-08-01 NOTE — Progress Notes (Signed)
Date:  08/01/2022   Name:  Tracy Bryan   DOB:  [birth year]   MRN:  [id number]   Chief Complaint: Annual Exam Tracy Bryan is a 62 y.o. female who presents today for her Complete Annual Exam. She feels fairly well. She reports exercising - none. She reports she is sleeping poorly. Breast complaints - none.  Mammogram: 03/2022 DEXA: none Pap smear: discontinued Colonoscopy: 2015 repeat 10 yrs  Health Maintenance Due  Topic Date Due   HIV Screening  Never done   COVID-19 Vaccine (3 - 2023-24 season) 10/13/2021   OPHTHALMOLOGY EXAM  04/15/2022    Immunization History  Administered Date(s) Administered   Influenza Split 11/07/2016   Influenza,inj,Quad PF,6+ Mos 11/28/2017, 11/17/2018, 11/10/2019, 11/30/2020, 11/29/2021   Influenza-Unspecified 11/20/2013, 11/13/2014   PFIZER(Purple Top)SARS-COV-2 Vaccination 06/04/2019, 06/30/2019   Pneumococcal Polysaccharide-23 12/20/2010   Tdap 11/20/2013, 07/30/2016   Zoster Recombinat (Shingrix) 04/03/2021, 08/02/2021    Diabetes She presents for her follow-up diabetic visit. She has type 2 diabetes mellitus. Her disease course has been stable. Pertinent negatives for hypoglycemia include no dizziness, headaches, nervousness/anxiousness or tremors. Pertinent negatives for diabetes include no chest pain, no fatigue, no polydipsia and no polyuria.  Hypertension This is a chronic problem. The problem is controlled. Pertinent negatives include no chest pain, headaches, palpitations or shortness of breath.  Cough This is a chronic problem. The problem occurs every few minutes. The cough is Non-productive. Pertinent negatives include no chest pain, chills, fever, headaches, rash, shortness of breath or wheezing. The symptoms are aggravated by lying down. She has tried nothing for the symptoms.    Lab Results  Component Value Date   NA 139 04/02/2022   K 5.1 04/02/2022   CO2 25 04/02/2022   GLUCOSE 114 (H) 04/02/2022   BUN 13  04/02/2022   CREATININE 0.86 04/02/2022   CALCIUM 10.1 04/02/2022   EGFR 76 04/02/2022   GFRNONAA 51 (L) 05/06/2021   Lab Results  Component Value Date   CHOL 158 07/26/2021   HDL 47 07/26/2021   LDLCALC 59 07/26/2021   TRIG 337 (H) 07/26/2021   CHOLHDL 3.4 07/26/2021   Lab Results  Component Value Date   TSH 0.936 07/26/2021   Lab Results  Component Value Date   HGBA1C 7.1 (H) 04/02/2022   Lab Results  Component Value Date   WBC 7.5 07/26/2021   HGB 13.6 07/26/2021   HCT 40.0 07/26/2021   MCV 87 07/26/2021   PLT 312 07/26/2021   Lab Results  Component Value Date   ALT 24 07/26/2021   AST 18 07/26/2021   ALKPHOS 67 07/26/2021   BILITOT 0.4 07/26/2021   Lab Results  Component Value Date   VD25OH 52.8 05/28/2017     Review of Systems  Constitutional:  Negative for chills, fatigue and fever.  HENT:  Negative for congestion, hearing loss, tinnitus, trouble swallowing and voice change.   Eyes:  Negative for visual disturbance.  Respiratory:  Positive for cough. Negative for chest tightness, shortness of breath and wheezing.   Cardiovascular:  Negative for chest pain, palpitations and leg swelling.  Gastrointestinal:  Negative for abdominal pain, constipation, diarrhea and vomiting.  Endocrine: Negative for polydipsia and polyuria.  Genitourinary:  Negative for dysuria, frequency, genital sores, vaginal bleeding and vaginal discharge.  Musculoskeletal:  Negative for arthralgias, gait problem and joint swelling.  Skin:  Negative for color change and rash.  Neurological:  Negative for dizziness, tremors, light-headedness and headaches.  Hematological:  Negative  for adenopathy. Does not bruise/bleed easily.  Psychiatric/Behavioral:  Negative for dysphoric mood and sleep disturbance. The patient is not nervous/anxious.     Patient Active Problem List   Diagnosis Date Noted   Stage 3a chronic kidney disease (HCC) 08/01/2022   Major depressive disorder with single  episode, 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   Muscle tension headache 03/30/2020   Status post total hysterectomy 03/23/2020   Sleep disorder 11/10/2019   Bilateral carpal tunnel syndrome 11/07/2016   Herpes simplex infection 11/23/2015   Fibromyalgia 03/11/2015   Menopause syndrome 02/17/2015   Esophageal reflux 02/17/2015   Vitamin D deficiency 02/17/2015   Hyperlipidemia associated with type 2 diabetes mellitus (HCC) 07/28/2014   Essential hypertension 07/28/2014   Type II diabetes mellitus with complication (HCC) 05/18/2014    Allergies  Allergen Reactions   Rabeprazole Sodium     C Diff Side Affects   Augmentin [Amoxicillin-Pot Clavulanate] Itching   Benazepril Hcl Cough   Penicillins Other (See Comments)   Jardiance [Empagliflozin] Rash    Recurrent yeast vaginitis    Past Surgical History:  Procedure Laterality Date   CERVICAL DISC SURGERY     COLONOSCOPY  07/22/2013   DILATION AND CURETTAGE OF UTERUS  1996   HERNIA REPAIR  2000   Umblical   HIP ARTHROPLASTY Right    TOTAL ABDOMINAL HYSTERECTOMY     TOTAL HIP ARTHROPLASTY Left 05/18/2014   Procedure: LEFT TOTAL HIP ARTHROPLASTY ANTERIOR APPROACH;  Surgeon: Marcene Corning, MD;  Location: [facility] OR;  Service: Orthopedics;  Laterality: Left;    Social History   Tobacco Use   Smoking status: Former    Types: Cigarettes    Quit date: 06/14/1984    Years since quitting: 38.1   Smokeless tobacco: Never  Vaping Use   Vaping Use: Never used  Substance Use Topics   Alcohol use: No    Alcohol/week: 0.0 standard drinks of alcohol   Drug use: No     Medication list has been reviewed and updated.  Current Meds  Medication Sig   amLODipine (NORVASC) 5 MG tablet TAKE 1 TABLET (5 MG TOTAL) BY MOUTH DAILY.   aspirin 81 MG tablet Take 81 mg by mouth daily.   atorvastatin (LIPITOR) 40 MG tablet TAKE 1 TABLET BY MOUTH EVERY DAY   busPIRone (BUSPAR) 10 MG tablet TAKE 1 TABLET BY MOUTH THREE TIMES A DAY   Cholecalciferol  (VITAMIN D) 2000 units CAPS Take 1 capsule (2,000 Units total) by mouth daily.   Continuous Blood Gluc Sensor (FREESTYLE LIBRE 3 SENSOR) MISC 1 each by Does not apply route every 14 (fourteen) days. Place 1 sensor on the skin every 14 days. Use to check glucose continuously   Dulaglutide (TRULICITY) 0.75 MG/0.5ML SOPN INJECT 0.75 MG SUBCUTANEOUSLY ONE TIME PER WEEK   DULoxetine (CYMBALTA) 60 MG capsule TAKE 1 CAPSULE BY MOUTH EVERY DAY   Icosapent Ethyl 0.5 g CAPS TAKE 2 CAPSULES BY MOUTH TWICE A DAY   losartan (COZAAR) 100 MG tablet TAKE 1 TABLET BY MOUTH EVERY DAY   metFORMIN (GLUCOPHAGE-XR) 500 MG 24 hr tablet TAKE 1 TABLET BY MOUTH EVERY DAY WITH BREAKFAST   metoprolol succinate (TOPROL-XL) 100 MG 24 hr tablet TAKE 0.5 TABLETS (50 MG TOTAL) BY MOUTH IN THE MORNING AND AT BEDTIME.   Multiple Vitamins-Minerals (MULTIVITAMIN WITH MINERALS) tablet Take 1 tablet by mouth daily.   omeprazole (PRILOSEC) 20 MG capsule Take 1 capsule (20 mg total) by mouth 2 (two) times daily before a meal.  valACYclovir (VALTREX) 1000 MG tablet TAKE 1 TABLET BY MOUTH TWICE A DAY   [DISCONTINUED] lansoprazole (PREVACID) 15 MG capsule Take 15 mg by mouth daily at 12 noon.       08/01/2022    8:05 AM 04/02/2022   10:41 AM 11/29/2021    8:05 AM 07/26/2021    9:42 AM  GAD 7 : Generalized Anxiety Score  Nervous, Anxious, on Edge 2 1 3 2   Control/stop worrying 2 2 3 2   Worry too much - different things 2 2 3 2   Trouble relaxing 2 1 2 1   Restless 2 1 3 2   Easily annoyed or irritable 2 1 3 2   Afraid - awful might happen 1 0 1 1  Total GAD 7 Score 13 8 18 12   Anxiety Difficulty Very difficult Somewhat difficult Very difficult Very difficult       08/01/2022    8:05 AM 04/02/2022   10:41 AM 11/29/2021    8:05 AM  Depression screen PHQ 2/9  Decreased Interest 2 1 2   Down, Depressed, Hopeless 0 1 1  PHQ - 2 Score 2 2 3   Altered sleeping 2 2 3   Tired, decreased energy 2 1 3   Change in appetite 1 0 3  Feeling  bad or failure about yourself  0 0 1  Trouble concentrating 2 1 2   Moving slowly or fidgety/restless 1 1 1   Suicidal thoughts 0 0 0  PHQ-9 Score 10 7 16   Difficult doing work/chores Very difficult Somewhat difficult Very difficult    BP Readings from Last 3 Encounters:  08/01/22 128/66  04/02/22 122/70  03/27/22 (!) 138/94    Physical Exam Vitals and nursing note reviewed.  Constitutional:      General: She is not in acute distress.    Appearance: She is well-developed.  HENT:     Head: Normocephalic and atraumatic.     Right Ear: Tympanic membrane and ear canal normal.     Left Ear: Tympanic membrane and ear canal normal.     Nose:     Right Sinus: No maxillary sinus tenderness.     Left Sinus: No maxillary sinus tenderness.  Eyes:     General: No scleral icterus.       Right eye: No discharge.        Left eye: No discharge.     Conjunctiva/sclera: Conjunctivae normal.  Neck:     Thyroid: No thyromegaly.     Vascular: No carotid bruit.  Cardiovascular:     Rate and Rhythm: Normal rate and regular rhythm.     Pulses: Normal pulses.     Heart sounds: Normal heart sounds.  Pulmonary:     Effort: Pulmonary effort is normal. No respiratory distress.     Breath sounds: No wheezing.  Chest:  Breasts:    Right: No mass, nipple discharge, skin change or tenderness.     Left: No mass, nipple discharge, skin change or tenderness.  Abdominal:     General: Bowel sounds are normal.     Palpations: Abdomen is soft.     Tenderness: There is no abdominal tenderness.  Musculoskeletal:     Cervical back: Normal range of motion. No erythema.     Right lower leg: No edema.     Left lower leg: No edema.  Lymphadenopathy:     Cervical: No cervical adenopathy.  Skin:    General: Skin is warm and dry.     Capillary Refill: Capillary refill takes less than  2 seconds.     Findings: No rash.  Neurological:     Mental Status: She is alert and oriented to person, place, and time.      Cranial Nerves: No cranial nerve deficit.     Sensory: No sensory deficit.     Deep Tendon Reflexes: Reflexes are normal and symmetric.  Psychiatric:        Attention and Perception: Attention normal.        Mood and Affect: Mood normal.    Diabetic Foot Exam - Simple   Simple Foot Form Diabetic Foot exam was performed with the following findings: Yes 08/01/2022  8:18 AM  Visual Inspection No deformities, no ulcerations, no other skin breakdown bilaterally: Yes Sensation Testing Intact to touch and monofilament testing bilaterally: Yes Pulse Check Posterior Tibialis and Dorsalis pulse intact bilaterally: Yes Comments      Wt Readings from Last 3 Encounters:  08/01/22 121 lb 3.2 oz (55 kg)  04/02/22 120 lb 9.6 oz (54.7 kg)  11/29/21 124 lb (56.2 kg)    BP 128/66   Pulse 73   Ht 5\' 4"  (1.626 m)   Wt 121 lb 3.2 oz (55 kg)   LMP  (LMP Unknown)   SpO2 98%   BMI 20.80 kg/m   Assessment and Plan:  Problem List Items Addressed This Visit     Type II diabetes mellitus with complication (HCC) (Chronic)    Blood sugars have been up and down over the past few months without hypoglycemic symptoms or events. There is no change in diet, medications or activity. Currently being treated with Metformin and Trulicity. Lab Results  Component Value Date   HGBA1C 7.1 (H) 04/02/2022  Will check labs then advise - may need to increase metformin to bid.       Relevant Orders   Comprehensive metabolic panel   Hemoglobin A1c   Stage 3a chronic kidney disease (HCC)    Continuing to monitor regularly.      Major depressive disorder with single episode, in partial remission (HCC) (Chronic)    Clinically stable on current regimen with fair control of symptoms, No SI or HI. No change in management at this time. Continue Cymbalta 120 mg and Buspar.      Relevant Orders   TSH   Hyperlipidemia associated with type 2 diabetes mellitus (HCC) (Chronic)    Tolerating statin medications.   No side effects noted. LDL is  Lab Results  Component Value Date   LDLCALC 59 07/26/2021        Relevant Orders   Lipid panel   Fibromyalgia (Chronic)    Symptoms stable on Cymbalta      Essential hypertension (Chronic)    Stable exam with well controlled BP.  Currently taking amlodipine,metoprolol and losartan. Tolerating medications without concerns or side effects. Will continue to recommend low sodium diet and current regimen.       Relevant Orders   CBC with Differential/Platelet   Comprehensive metabolic panel   Esophageal reflux (Chronic)    Having nocturnal gerd and cough on otc prevacid Will start omeprazole bid and reassess in one month      Relevant Medications   omeprazole (PRILOSEC) 20 MG capsule   Other Relevant Orders   CBC with Differential/Platelet   Other Visit Diagnoses     Annual physical exam    -  Primary   up to date on screenings   Need for vaccination for pneumococcus       Relevant Orders  Pneumococcal conjugate vaccine 20-valent   Long-term current use of injectable noninsulin antidiabetic medication           Return in about 4 months (around 12/01/2022) for DM, HTN.   Partially dictated using Dragon software, any errors are not intentional.  Reubin Milan, MD [facility] [place], [place]

## 2022-08-01 NOTE — Assessment & Plan Note (Signed)
Stable exam with well controlled BP.  Currently taking amlodipine,metoprolol and losartan. Tolerating medications without concerns or side effects. Will continue to recommend low sodium diet and current regimen.

## 2022-08-01 NOTE — Assessment & Plan Note (Signed)
Tolerating statin medications.  No side effects noted. LDL is  Lab Results  Component Value Date   LDLCALC 59 07/26/2021

## 2022-08-01 NOTE — Assessment & Plan Note (Signed)
Symptoms stable on Cymbalta

## 2022-08-01 NOTE — Assessment & Plan Note (Signed)
Having nocturnal gerd and cough on otc prevacid Will start omeprazole bid and reassess in one month

## 2022-08-02 LAB — LIPID PANEL
Chol/HDL Ratio: 3.4 ratio (ref 0.0–4.4)
Cholesterol, Total: 169 mg/dL (ref 100–199)
HDL: 49 mg/dL (ref >39)
LDL Chol Calc (NIH): 63 mg/dL (ref 0–99)
Triglycerides: 368 mg/dL — ABNORMAL HIGH (ref 0–149)
VLDL Cholesterol Cal: 57 mg/dL — ABNORMAL HIGH (ref 5–40)

## 2022-08-02 LAB — CBC WITH DIFFERENTIAL/PLATELET
Basophils Absolute: 0.2 10*3/uL (ref 0.0–0.2)
Basos: 2 %
EOS (ABSOLUTE): 0.5 10*3/uL — ABNORMAL HIGH (ref 0.0–0.4)
Eos: 6 %
Hematocrit: 41.4 % (ref 34.0–46.6)
Hemoglobin: 14 g/dL (ref 11.1–15.9)
Immature Grans (Abs): 0.1 10*3/uL (ref 0.0–0.1)
Immature Granulocytes: 1 %
Lymphocytes Absolute: 2.6 10*3/uL (ref 0.7–3.1)
Lymphs: 33 %
MCH: 29.7 pg (ref 26.6–33.0)
MCHC: 33.8 g/dL (ref 31.5–35.7)
MCV: 88 fL (ref 79–97)
Monocytes Absolute: 0.6 10*3/uL (ref 0.1–0.9)
Monocytes: 8 %
Neutrophils Absolute: 4 10*3/uL (ref 1.4–7.0)
Neutrophils: 50 %
Platelets: 297 10*3/uL (ref 150–450)
RBC: 4.71 x10E6/uL (ref 3.77–5.28)
RDW: 13 % (ref 11.7–15.4)
WBC: 7.8 10*3/uL (ref 3.4–10.8)

## 2022-08-02 LAB — COMPREHENSIVE METABOLIC PANEL
ALT: 23 IU/L (ref 0–32)
AST: 20 IU/L (ref 0–40)
Albumin: 4.7 g/dL (ref 3.9–4.9)
Alkaline Phosphatase: 71 IU/L (ref 44–121)
BUN/Creatinine Ratio: 16 (ref 12–28)
BUN: 14 mg/dL (ref 8–27)
Bilirubin Total: 0.3 mg/dL (ref 0.0–1.2)
CO2: 22 mmol/L (ref 20–29)
Calcium: 9.7 mg/dL (ref 8.7–10.3)
Chloride: 102 mmol/L (ref 96–106)
Creatinine, Ser: 0.89 mg/dL (ref 0.57–1.00)
Globulin, Total: 2.7 g/dL (ref 1.5–4.5)
Glucose: 122 mg/dL — ABNORMAL HIGH (ref 70–99)
Potassium: 5.4 mmol/L — ABNORMAL HIGH (ref 3.5–5.2)
Sodium: 140 mmol/L (ref 134–144)
Total Protein: 7.4 g/dL (ref 6.0–8.5)
eGFR: 72 mL/min/{1.73_m2} (ref >59)

## 2022-08-02 LAB — HEMOGLOBIN A1C
Est. average glucose Bld gHb Est-mCnc: 160 mg/dL
Hgb A1c MFr Bld: 7.2 % — ABNORMAL HIGH (ref 4.8–5.6)

## 2022-08-02 LAB — TSH: TSH: 1.28 u[IU]/mL (ref 0.450–4.500)

## 2022-08-10 ENCOUNTER — Other Ambulatory Visit: Payer: Self-pay | Admitting: Internal Medicine

## 2022-08-10 DIAGNOSIS — F39 Unspecified mood [affective] disorder: Secondary | ICD-10-CM

## 2022-08-10 DIAGNOSIS — F411 Generalized anxiety disorder: Secondary | ICD-10-CM

## 2022-08-10 DIAGNOSIS — E118 Type 2 diabetes mellitus with unspecified complications: Secondary | ICD-10-CM

## 2022-08-26 ENCOUNTER — Other Ambulatory Visit: Payer: Self-pay | Admitting: Internal Medicine

## 2022-08-26 DIAGNOSIS — K219 Gastro-esophageal reflux disease without esophagitis: Secondary | ICD-10-CM

## 2022-08-30 ENCOUNTER — Other Ambulatory Visit: Payer: Self-pay | Admitting: Internal Medicine

## 2022-08-30 DIAGNOSIS — E1169 Type 2 diabetes mellitus with other specified complication: Secondary | ICD-10-CM

## 2022-08-30 NOTE — Telephone Encounter (Signed)
Requested Prescriptions  Pending Prescriptions Disp Refills   atorvastatin (LIPITOR) 40 MG tablet [Pharmacy Med Name: ATORVASTATIN 40 MG TABLET] 90 tablet 1    Sig: TAKE 1 TABLET BY MOUTH EVERY DAY     Cardiovascular:  Antilipid - Statins Failed - 08/30/2022  2:37 AM      Failed - Lipid Panel in normal range within the last 12 months    Cholesterol, Total  Date Value Ref Range Status  08/01/2022 169 100 - 199 mg/dL Final   Cholesterol Piccolo, Waived  Date Value Ref Range Status  07/28/2014 CANCELED mg/dL     Comment:    No green top heparin tube submitted.                         Desirable                <200                         Borderline High      200- 239                         High                     >239  Result canceled by the ancillary    LDL Chol Calc (NIH)  Date Value Ref Range Status  08/01/2022 63 0 - 99 mg/dL Final   HDL  Date Value Ref Range Status  08/01/2022 49 >39 mg/dL Final   Triglycerides  Date Value Ref Range Status  08/01/2022 368 (H) 0 - 149 mg/dL Final   Triglycerides Piccolo,Waived  Date Value Ref Range Status  07/28/2014 CANCELED      Comment:    Test not performed  Result canceled by the ancillary          Passed - Patient is not pregnant      Passed - Valid encounter within last 12 months    Recent Outpatient Visits           4 weeks ago Annual physical exam   [place] [facility] at [facility], Nyoka Cowden, MD   5 months ago Type II diabetes mellitus with complication [facility])   [place] [facility] at [facility] Nyoka Cowden, MD   9 months ago Type II diabetes mellitus with complication [facility])   [place] [facility] at [facility], Nyoka Cowden, MD   1 year ago Annual physical exam   [place] [facility] at [facility], Nyoka Cowden, MD   1 year ago Diarrhea, unspecified type   [facility] at [facility], Nyoka Cowden, MD       Future Appointments             In 2 months Judithann Graves, Nyoka Cowden, MD [facility] at [facility], [facility]

## 2022-09-21 ENCOUNTER — Ambulatory Visit: Payer: Self-pay | Admitting: *Deleted

## 2022-09-21 NOTE — Telephone Encounter (Signed)
  Chief Complaint: Metformin SE Symptoms: diarrhea, abdominal pain- increased burping Frequency: started 2 weeks ago- worse yesterday  Disposition: [] ED /[] Urgent Care (no appt availability in office) / [] Appointment(In office/virtual)/ []  [place] [facility]/ [] Home Care/ [] Refused Recommended Disposition /[] [facility]/ [x]  Follow-up with PCP Additional Notes: Patient states she is having SE with increased dosing of metformin- she states this happens every time she increases the dose.  She may need alternative.

## 2022-09-21 NOTE — Telephone Encounter (Signed)
Reason for Disposition . [1] Caller has URGENT medicine question about med that PCP or specialist prescribed AND [2] triager unable to answer question  Answer Assessment - Initial Assessment Questions 1. NAME of MEDICINE: "What medicine(s) are you calling about?"     Metformin- increased dosing 2 pills in June- side effects worse yesterday- but really going on for 2 weeks 2. QUESTION: "What is your question?" (e.g., double dose of medicine, side effect)     Patient wants alternative- she reports everytime she tries to increase dosing this happens 3. PRESCRIBER: "Who prescribed the medicine?" Reason: if prescribed by specialist, call should be referred to that group.     PCP 4. SYMPTOMS: "Do you have any symptoms?" If Yes, ask: "What symptoms are you having?"  "How bad are the symptoms (e.g., mild, moderate, severe)     Diarrhea, abdominal pain-increased burping  Protocols used: Medication Question Call-A-AH

## 2022-09-21 NOTE — Telephone Encounter (Signed)
Patient informed to take one metformin daily. Patient verbalized understanding.  - Tracy Bryan

## 2022-09-24 ENCOUNTER — Other Ambulatory Visit: Payer: Self-pay | Admitting: Internal Medicine

## 2022-09-24 DIAGNOSIS — I10 Essential (primary) hypertension: Secondary | ICD-10-CM

## 2022-11-20 ENCOUNTER — Ambulatory Visit: Payer: Managed Care, Other (non HMO) | Admitting: Internal Medicine

## 2022-11-20 ENCOUNTER — Encounter: Payer: Self-pay | Admitting: Internal Medicine

## 2022-11-20 ENCOUNTER — Ambulatory Visit
Admission: RE | Admit: 2022-11-20 | Discharge: 2022-11-20 | Disposition: A | Discharge status: Home / Self Care | Payer: Managed Care, Other (non HMO) | Source: Ambulatory Visit | Attending: Internal Medicine | Admitting: Internal Medicine

## 2022-11-20 ENCOUNTER — Ambulatory Visit
Admission: RE | Admit: 2022-11-20 | Discharge: 2022-11-20 | Disposition: A | Discharge status: Home / Self Care | Payer: Managed Care, Other (non HMO) | Attending: Internal Medicine | Admitting: Internal Medicine

## 2022-11-20 VITALS — BP 112/68 | HR 78 | Ht 64.0 in | Wt 124.6 lb

## 2022-11-20 DIAGNOSIS — M25562 Pain in left knee: Secondary | ICD-10-CM

## 2022-11-20 DIAGNOSIS — M25561 Pain in right knee: Secondary | ICD-10-CM | POA: Insufficient documentation

## 2022-11-20 DIAGNOSIS — Z23 Encounter for immunization: Secondary | ICD-10-CM | POA: Diagnosis not present

## 2022-11-20 DIAGNOSIS — E118 Type 2 diabetes mellitus with unspecified complications: Secondary | ICD-10-CM | POA: Diagnosis not present

## 2022-11-20 DIAGNOSIS — G8929 Other chronic pain: Secondary | ICD-10-CM | POA: Insufficient documentation

## 2022-11-20 DIAGNOSIS — Z7984 Long term (current) use of oral hypoglycemic drugs: Secondary | ICD-10-CM

## 2022-11-20 LAB — POCT GLYCOSYLATED HEMOGLOBIN (HGB A1C): Hemoglobin A1C: 7.1 % — AB (ref 4.0–5.6)

## 2022-11-20 MED ORDER — MELOXICAM 15 MG PO TABS: 15.0000 mg | ORAL_TABLET | Freq: Every day | ORAL | 0 refills | Status: DC

## 2022-11-20 NOTE — Progress Notes (Addendum)
Date:  11/20/2022   Name:  Tracy Bryan   DOB:  06-06-1960   MRN:  [id number]   Chief Complaint: Hypertension and Diabetes  Hypertension This is a chronic problem. The problem is controlled. Pertinent negatives include no blurred vision, headaches, peripheral edema or shortness of breath. Past treatments include angiotensin blockers, calcium channel blockers and beta blockers. There is no history of kidney disease, CAD/MI or CVA.  Diabetes She presents for her follow-up diabetic visit. She has type 2 diabetes mellitus. Her disease course has been stable. Pertinent negatives for hypoglycemia include no headaches. Pertinent negatives for diabetes include no blurred vision, no foot paresthesias, no foot ulcerations, no visual change and no weight loss. Symptoms are stable. Pertinent negatives for diabetic complications include no CVA.  Knee Pain  There was no injury mechanism. The pain is present in the left knee and right knee. The quality of the pain is described as aching and shooting. The pain is moderate. The pain has been Fluctuating since onset. Associated symptoms comments: Limits walking for exercise.    Review of Systems  Constitutional:  Negative for weight loss.  Eyes:  Negative for blurred vision.  Respiratory:  Negative for shortness of breath.   Neurological:  Negative for headaches.     Lab Results  Component Value Date   NA 140 08/01/2022   K 5.4 (H) 08/01/2022   CO2 22 08/01/2022   GLUCOSE 122 (H) 08/01/2022   BUN 14 08/01/2022   CREATININE 0.89 08/01/2022   CALCIUM 9.7 08/01/2022   EGFR 72 08/01/2022   GFRNONAA 51 (L) 05/06/2021   Lab Results  Component Value Date   CHOL 169 08/01/2022   HDL 49 08/01/2022   LDLCALC 63 08/01/2022   TRIG 368 (H) 08/01/2022   CHOLHDL 3.4 08/01/2022   Lab Results  Component Value Date   TSH 1.280 08/01/2022   Lab Results  Component Value Date   HGBA1C 7.1 (A) 11/20/2022   Lab Results  Component Value Date    WBC 7.8 08/01/2022   HGB 14.0 08/01/2022   HCT 41.4 08/01/2022   MCV 88 08/01/2022   PLT 297 08/01/2022   Lab Results  Component Value Date   ALT 23 08/01/2022   AST 20 08/01/2022   ALKPHOS 71 08/01/2022   BILITOT 0.3 08/01/2022   Lab Results  Component Value Date   VD25OH 52.8 05/28/2017     Patient Active Problem List   Diagnosis Date Noted   Chronic pain of both knees 11/20/2022   Stage 3a chronic kidney disease (HCC) 08/01/2022   Major depressive disorder with single episode, 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   Muscle tension headache 03/30/2020   Status post total hysterectomy 03/23/2020   Sleep disorder 11/10/2019   Bilateral carpal tunnel syndrome 11/07/2016   Herpes simplex infection 11/23/2015   Fibromyalgia 03/11/2015   Menopause syndrome 02/17/2015   Esophageal reflux 02/17/2015   Vitamin D deficiency 02/17/2015   Hyperlipidemia associated with type 2 diabetes mellitus (HCC) 07/28/2014   Essential hypertension 07/28/2014   Type II diabetes mellitus with complication (HCC) 05/18/2014    Allergies  Allergen Reactions   Rabeprazole Sodium     C Diff Side Affects   Augmentin [Amoxicillin-Pot Clavulanate] Itching   Benazepril Hcl Cough   Penicillins Other (See Comments)   Jardiance [Empagliflozin] Rash    Recurrent yeast vaginitis    Past Surgical History:  Procedure Laterality Date   CERVICAL DISC SURGERY     COLONOSCOPY  07/22/2013   DILATION AND CURETTAGE OF UTERUS  1996   HERNIA REPAIR  2000   Umblical   HIP ARTHROPLASTY Right    TOTAL ABDOMINAL HYSTERECTOMY     TOTAL HIP ARTHROPLASTY Left 05/18/2014   Procedure: LEFT TOTAL HIP ARTHROPLASTY ANTERIOR APPROACH;  Surgeon: Marcene Corning, MD;  Location: [facility];  Service: Orthopedics;  Laterality: Left;    Social History   Tobacco Use   Smoking status: Former    Current packs/day: 0.00    Types: Cigarettes    Quit date: 06/14/1984    Years since quitting: 38.4   Smokeless tobacco: Never   Vaping Use   Vaping status: Never Used  Substance Use Topics   Alcohol use: No    Alcohol/week: 0.0 standard drinks of alcohol   Drug use: No     Medication list has been reviewed and updated.  Current Meds  Medication Sig   amLODipine (NORVASC) 5 MG tablet TAKE 1 TABLET (5 MG TOTAL) BY MOUTH DAILY.   aspirin 81 MG tablet Take 81 mg by mouth daily.   atorvastatin (LIPITOR) 40 MG tablet TAKE 1 TABLET BY MOUTH EVERY DAY   busPIRone (BUSPAR) 10 MG tablet TAKE 1 TABLET BY MOUTH THREE TIMES A DAY   Cholecalciferol (VITAMIN D) 2000 units CAPS Take 1 capsule (2,000 Units total) by mouth daily.   Dulaglutide (TRULICITY) 0.75 MG/0.5ML SOPN INJECT 0.75 MG SUBCUTANEOUSLY ONE TIME PER WEEK   DULoxetine (CYMBALTA) 60 MG capsule TAKE 1 CAPSULE BY MOUTH 2 TIMES DAILY.   Icosapent Ethyl 0.5 g CAPS TAKE 2 CAPSULES BY MOUTH TWICE A DAY   losartan (COZAAR) 100 MG tablet TAKE 1 TABLET BY MOUTH EVERY DAY   meloxicam (MOBIC) 15 MG tablet Take 1 tablet (15 mg total) by mouth daily.   metFORMIN (GLUCOPHAGE-XR) 500 MG 24 hr tablet TAKE 1 TABLET BY MOUTH EVERY DAY WITH BREAKFAST   metoprolol succinate (TOPROL-XL) 100 MG 24 hr tablet TAKE 0.5 TABLETS (50 MG TOTAL) BY MOUTH IN THE MORNING AND AT BEDTIME.   Multiple Vitamins-Minerals (MULTIVITAMIN WITH MINERALS) tablet Take 1 tablet by mouth daily.   omeprazole (PRILOSEC) 20 MG capsule TAKE 1 CAPSULE (20 MG TOTAL) BY MOUTH 2 (TWO) TIMES DAILY BEFORE A MEAL.   valACYclovir (VALTREX) 1000 MG tablet TAKE 1 TABLET BY MOUTH TWICE A DAY   [DISCONTINUED] Continuous Glucose Sensor (FREESTYLE LIBRE 3 SENSOR) MISC PLACE 1 SENSOR ON THE SKIN EVERY 14 DAYS. USE TO CHECK GLUCOSE CONTINUOUSLY   [DISCONTINUED] metFORMIN (GLUCOPHAGE) 500 MG tablet TAKE 1 TABLET BY MOUTH EVERY DAY WITH BREAKFAST       11/20/2022   11:03 AM 08/01/2022    8:05 AM 04/02/2022   10:41 AM 11/29/2021    8:05 AM  GAD 7 : Generalized Anxiety Score  Nervous, Anxious, on Edge 2 2 1 3   Control/stop  worrying 2 2 2 3   Worry too much - different things 2 2 2 3   Trouble relaxing 2 2 1 2   Restless 2 2 1 3   Easily annoyed or irritable 2 2 1 3   Afraid - awful might happen 1 1 0 1  Total GAD 7 Score 13 13 8 18   Anxiety Difficulty Very difficult Very difficult Somewhat difficult Very difficult       11/20/2022   11:03 AM 08/01/2022    8:05 AM 04/02/2022   10:41 AM  Depression screen PHQ 2/9  Decreased Interest 1 2 1   Down, Depressed, Hopeless 1 0 1  PHQ - 2  Score 2 2 2   Altered sleeping 2 2 2   Tired, decreased energy 1 2 1   Change in appetite 2 1 0  Feeling bad or failure about yourself  1 0 0  Trouble concentrating 1 2 1   Moving slowly or fidgety/restless 2 1 1   Suicidal thoughts 0 0 0  PHQ-9 Score 11 10 7   Difficult doing work/chores Somewhat difficult Very difficult Somewhat difficult    BP Readings from Last 3 Encounters:  11/20/22 112/68  08/01/22 128/66  04/02/22 122/70    Physical Exam Vitals and nursing note reviewed.  Constitutional:      General: She is not in acute distress.    Appearance: Normal appearance. She is well-developed.  HENT:     Head: Normocephalic and atraumatic.  Neck:     Vascular: No carotid bruit.  Cardiovascular:     Rate and Rhythm: Normal rate and regular rhythm.  Pulmonary:     Effort: Pulmonary effort is normal. No respiratory distress.     Breath sounds: No wheezing or rhonchi.  Musculoskeletal:     Cervical back: Normal range of motion.     Right knee: Crepitus present. No swelling, deformity or effusion. Decreased range of motion.     Left knee: Crepitus present. No swelling, deformity or effusion. Decreased range of motion.     Right lower leg: No edema.     Left lower leg: No edema.  Lymphadenopathy:     Cervical: No cervical adenopathy.  Skin:    General: Skin is warm and dry.     Findings: No rash.  Neurological:     Mental Status: She is alert and oriented to person, place, and time.  Psychiatric:        Mood and  Affect: Mood normal.        Behavior: Behavior normal.     Wt Readings from Last 3 Encounters:  11/20/22 124 lb 9.6 oz (56.5 kg)  08/01/22 121 lb 3.2 oz (55 kg)  04/02/22 120 lb 9.6 oz (54.7 kg)    BP 112/68   Pulse 78   Ht 5\' 4"  (1.626 m)   Wt 124 lb 9.6 oz (56.5 kg)   LMP  (LMP Unknown)   SpO2 97%   BMI 21.39 kg/m   Assessment and Plan:  Problem List Items Addressed This Visit       Unprioritized   Chronic pain of both knees    Gradually worsening  L>R and restricting activity - esp walking for exercise Taking Advil with minimal benefit; has not been previously evaluated Will image left knee and begin Mobic 15 mg daily PRN Consider Ortho referral once imaging is resulted.      Relevant Medications   meloxicam (MOBIC) 15 MG tablet   Other Relevant Orders   DG Knee Complete 4 Views Left   Type II diabetes mellitus with complication (HCC) - Primary (Chronic)    Blood sugars stable without hypoglycemic symptoms or events. Current regimen is Trulicity and metformin. Changes made last visit are none. Lab Results  Component Value Date   HGBA1C 7.1 (A) 11/20/2022  Will request eye exam.       Relevant Orders   POCT glycosylated hemoglobin (Hb A1C) (Completed)   Other Visit Diagnoses     Long term current use of oral hypoglycemic drug       Need for influenza vaccination       Relevant Orders   Flu vaccine trivalent PF, 6mos and older(Flulaval,Afluria,Fluarix,Fluzone) (Completed)  Return in about 4 months (around 03/23/2023) for DM, HTN.    Reubin Milan, MD [facility] [place]

## 2022-11-20 NOTE — Assessment & Plan Note (Addendum)
Blood sugars stable without hypoglycemic symptoms or events. Current regimen is Trulicity and metformin. Changes made last visit are none. Lab Results  Component Value Date   HGBA1C 7.1 (A) 11/20/2022  Will request eye exam.

## 2022-11-20 NOTE — Assessment & Plan Note (Signed)
Gradually worsening  L>R and restricting activity - esp walking for exercise Taking Advil with minimal benefit; has not been previously evaluated Will image left knee and begin Mobic 15 mg daily PRN Consider Ortho referral once imaging is resulted.

## 2022-12-17 ENCOUNTER — Other Ambulatory Visit: Payer: Self-pay | Admitting: Internal Medicine

## 2022-12-17 DIAGNOSIS — G8929 Other chronic pain: Secondary | ICD-10-CM

## 2022-12-17 DIAGNOSIS — M25562 Pain in left knee: Secondary | ICD-10-CM

## 2022-12-18 NOTE — Telephone Encounter (Signed)
Requested by interface surescripts. Future visit in 3 months.  Requested Prescriptions  Pending Prescriptions Disp Refills   meloxicam (MOBIC) 15 MG tablet [Pharmacy Med Name: MELOXICAM 15 MG TABLET] 30 tablet 0    Sig: TAKE 1 TABLET (15 MG TOTAL) BY MOUTH DAILY.     Analgesics:  COX2 Inhibitors Failed - 12/17/2022  1:17 AM      Failed - Manual Review: Labs are only required if the patient has taken medication for more than 8 weeks.      Passed - HGB in normal range and within 360 days    Hemoglobin  Date Value Ref Range Status  08/01/2022 14.0 11.1 - 15.9 g/dL Final         Passed - Cr in normal range and within 360 days    Creatinine  Date Value Ref Range Status  07/08/2013 0.92 0.60 - 1.30 mg/dL Final   Creatinine, Ser  Date Value Ref Range Status  08/01/2022 0.89 0.57 - 1.00 mg/dL Final         Passed - HCT in normal range and within 360 days    Hematocrit  Date Value Ref Range Status  08/01/2022 41.4 34.0 - 46.6 % Final         Passed - AST in normal range and within 360 days    AST  Date Value Ref Range Status  08/01/2022 20 0 - 40 IU/L Final   SGOT(AST)  Date Value Ref Range Status  07/08/2013 18 15 - 37 Unit/L Final         Passed - ALT in normal range and within 360 days    ALT  Date Value Ref Range Status  08/01/2022 23 0 - 32 IU/L Final   SGPT (ALT)  Date Value Ref Range Status  07/08/2013 16 12 - 78 U/L Final         Passed - eGFR is 30 or above and within 360 days    EGFR (African American)  Date Value Ref Range Status  07/08/2013 >60  Final   GFR calc Af Amer  Date Value Ref Range Status  07/21/2019 89 >59 mL/min/1.73 Final    Comment:    **[facility] currently reports eGFR in compliance with the current**   recommendations of the [employer]. [facility] will   update reporting as new guidelines are published from the NKF-ASN   Task force.    EGFR (Non-African Amer.)  Date Value Ref Range Status  07/08/2013 >60  Final     Comment:    eGFR values <11mL/min/1.73 m2 may be an indication of chronic kidney disease (CKD). Calculated eGFR is useful in patients with stable renal function. The eGFR calculation will not be reliable in acutely ill patients when serum creatinine is changing rapidly. It is not useful in  patients on dialysis. The eGFR calculation may not be applicable to patients at the low and high extremes of body sizes, pregnant women, and vegetarians.    GFR, Estimated  Date Value Ref Range Status  05/06/2021 51 (L) >60 mL/min Final    Comment:    (NOTE) Calculated using the CKD-EPI Creatinine Equation (2021)    eGFR  Date Value Ref Range Status  08/01/2022 72 >59 mL/min/1.73 Final         Passed - Patient is not pregnant      Passed - Valid encounter within last 12 months    Recent Outpatient Visits           4  weeks ago Type II diabetes mellitus with complication [facility])   [place] [facility] at [facility], Nyoka Cowden, MD   4 months ago Annual physical exam   [facility] at [facility], Nyoka Cowden, MD   8 months ago Type II diabetes mellitus with complication [facility])   [place] [facility] at [facility], Nyoka Cowden, MD   1 year ago Type II diabetes mellitus with complication [facility])   [place] [facility] at [facility], Nyoka Cowden, MD   1 year ago Annual physical exam   [facility] at [facility], Nyoka Cowden, MD       Future Appointments             In 3 months Judithann Graves, Nyoka Cowden, MD [facility] at [facility], [facility]   In 7 months Judithann Graves, Nyoka Cowden, MD [facility] at [facility], [facility]

## 2023-01-18 ENCOUNTER — Ambulatory Visit
Admission: EM | Admit: 2023-01-18 | Discharge: 2023-01-18 | Disposition: A | Discharge status: Home / Self Care | Payer: Managed Care, Other (non HMO) | Attending: Physician Assistant | Admitting: Physician Assistant

## 2023-01-18 DIAGNOSIS — J029 Acute pharyngitis, unspecified: Secondary | ICD-10-CM

## 2023-01-18 DIAGNOSIS — R0981 Nasal congestion: Secondary | ICD-10-CM | POA: Diagnosis not present

## 2023-01-18 DIAGNOSIS — J019 Acute sinusitis, unspecified: Secondary | ICD-10-CM | POA: Diagnosis not present

## 2023-01-18 MED ORDER — CEFDINIR 300 MG PO CAPS: 300.0000 mg | ORAL_CAPSULE | Freq: Two times a day (BID) | ORAL | 0 refills | Status: AC

## 2023-01-18 NOTE — ED Triage Notes (Signed)
Nose bleeds started middle of last week. Still having them.   Body aches Monday Headache today Sore throat 2 days ago.

## 2023-01-18 NOTE — Discharge Instructions (Signed)
-  I sent antibiotics to the pharmacy for your sinus infection. - May continue Mucinex, rest and fluids. - Return if fever, worsening cough, shortness of breath worsens.

## 2023-01-18 NOTE — ED Provider Notes (Signed)
[facility]    CSN: [id number] Arrival date & time: 01/18/23  1753      History   Chief Complaint Chief Complaint  Patient presents with   Sore Throat   Generalized Body Aches   Headache   Epistaxis    HPI Tracy Bryan is a 62 y.o. female presenting for approximately 10-day history of nasal congestion, sinus pressure, postnasal drainage, nosebleeds (able to stop nosebleeds within a couple seconds),, fatigue, cough, headaches.  She also reports having bodyaches all week this week and states that her throat has become sore over the past couple of days.  Denies temperatures above 100 degrees.  No reported chest pain or wheezing, vomiting or diarrhea.  Denies sick contacts.  Has been taking Mucinex DM.  No other complaints.  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   Muscle tension headache 03/30/2020   Status post total hysterectomy 03/23/2020   Sleep disorder 11/10/2019   Bilateral carpal tunnel syndrome 11/07/2016   Herpes simplex infection 11/23/2015   Fibromyalgia 03/11/2015   Menopause syndrome 02/17/2015   Esophageal reflux 02/17/2015   Vitamin D deficiency 02/17/2015   Hyperlipidemia associated with type 2 diabetes mellitus (HCC) 07/28/2014   Essential hypertension 07/28/2014   Type II diabetes mellitus with complication (HCC) 05/18/2014    Past Surgical History:  Procedure Laterality Date   CERVICAL  DISC SURGERY     COLONOSCOPY  07/22/2013   DILATION AND CURETTAGE OF UTERUS  1996   HERNIA REPAIR  2000   Umblical   HIP ARTHROPLASTY Right    TOTAL ABDOMINAL HYSTERECTOMY     TOTAL HIP ARTHROPLASTY Left 05/18/2014   Procedure: LEFT TOTAL HIP ARTHROPLASTY ANTERIOR APPROACH;  Surgeon: Marcene Corning, MD;  Location: [facility] OR;  Service: Orthopedics;  Laterality: Left;    OB History   No obstetric history on file.      Home Medications    Prior to Admission medications   Medication Sig Start Date End Date Taking? Authorizing Provider  amLODipine (NORVASC) 5 MG tablet TAKE 1 TABLET (5 MG TOTAL) BY MOUTH DAILY. 09/24/22  Yes Reubin Milan, MD  aspirin 81 MG tablet Take 81 mg by mouth daily.   Yes [provider]  atorvastatin (LIPITOR) 40 MG tablet TAKE 1 TABLET BY MOUTH EVERY DAY 08/30/22  Yes Reubin Milan, MD  busPIRone (BUSPAR) 10 MG tablet TAKE 1 TABLET BY MOUTH THREE TIMES A DAY 08/12/22  Yes Reubin Milan, MD  cefdinir (OMNICEF) 300 MG capsule Take 1 capsule (300 mg total) by mouth 2 (two) times daily for 7 days. 01/18/23 01/25/23 Yes Shirlee Latch, PA-C  Cholecalciferol (VITAMIN D) 2000 units CAPS Take 1 capsule (2,000 Units total) by mouth daily. 02/17/15  Yes Plonk, Chrissie Noa, MD  Dulaglutide (TRULICITY) 0.75 MG/0.5ML SOPN INJECT 0.75 MG SUBCUTANEOUSLY ONE TIME PER WEEK 08/12/22  Yes Bari Edward  H, MD  DULoxetine (CYMBALTA) 60 MG capsule TAKE 1 CAPSULE BY MOUTH 2 TIMES DAILY. 08/12/22  Yes Reubin Milan, MD  Icosapent Ethyl 0.5 g CAPS TAKE 2 CAPSULES BY MOUTH TWICE A DAY 06/11/22  Yes Reubin Milan, MD  losartan (COZAAR) 100 MG tablet TAKE 1 TABLET BY MOUTH EVERY DAY 06/11/22  Yes Reubin Milan, MD  meloxicam (MOBIC) 15 MG tablet TAKE 1 TABLET (15 MG TOTAL) BY MOUTH DAILY. 12/18/22  Yes Reubin Milan, MD  metFORMIN (GLUCOPHAGE-XR) 500 MG 24 hr tablet TAKE 1 TABLET BY MOUTH EVERY DAY WITH BREAKFAST 08/12/22  Yes Reubin Milan, MD  metoprolol succinate  (TOPROL-XL) 100 MG 24 hr tablet TAKE 0.5 TABLETS (50 MG TOTAL) BY MOUTH IN THE MORNING AND AT BEDTIME. 08/12/22  Yes Reubin Milan, MD  Multiple Vitamins-Minerals (MULTIVITAMIN WITH MINERALS) tablet Take 1 tablet by mouth daily.   Yes [provider]  omeprazole (PRILOSEC) 20 MG capsule TAKE 1 CAPSULE (20 MG TOTAL) BY MOUTH 2 (TWO) TIMES DAILY BEFORE A MEAL. 08/27/22  Yes Reubin Milan, MD  valACYclovir (VALTREX) 1000 MG tablet TAKE 1 TABLET BY MOUTH TWICE A DAY 03/22/21  Yes Reubin Milan, MD    Family History Family History  Problem Relation Age of Onset   Hypertension Mother    Stroke Mother    Hyperlipidemia Mother    Heart disease Mother        heart attack   Diabetes Father    Cancer Father        lung   Diabetes Sister    Hyperlipidemia Sister    Stroke Paternal Grandfather    Hypertension Paternal Grandfather    Stroke Maternal Grandfather    Alzheimer's disease Paternal Grandmother    Diabetes Sister    Cancer Sister        breast   Breast cancer Sister 53   Breast cancer Paternal Aunt     Social History Social History   Tobacco Use   Smoking status: Former    Current packs/day: 0.00    Types: Cigarettes    Quit date: 06/14/1984    Years since quitting: 38.6   Smokeless tobacco: Never  Vaping Use   Vaping status: Never Used  Substance Use Topics   Alcohol use: No    Alcohol/week: 0.0 standard drinks of alcohol   Drug use: No     Allergies   Rabeprazole sodium, Augmentin [amoxicillin-pot clavulanate], Benazepril hcl, Penicillins, and Jardiance [empagliflozin]   Review of Systems Review of Systems  Constitutional:  Positive for fatigue. Negative for chills, diaphoresis and fever.  HENT:  Positive for congestion, postnasal drip, rhinorrhea, sinus pressure and sore throat. Negative for ear pain and sinus pain.   Respiratory:  Positive for cough. Negative for shortness of breath.   Gastrointestinal:  Negative for abdominal pain, nausea and  vomiting.  Musculoskeletal:  Positive for myalgias.  Skin:  Negative for rash.  Neurological:  Positive for headaches. Negative for weakness.  Hematological:  Negative for adenopathy.     Physical Exam Triage Vital Signs ED Triage Vitals  Encounter Vitals Group     BP 01/18/23 1813 (!) 149/89     Systolic BP Percentile --      Diastolic BP Percentile --      Pulse Rate 01/18/23 1813 78     Resp 01/18/23 1813 19     Temp 01/18/23 1813 97.6 F (36.4 C)     Temp Source 01/18/23 1813 Oral  SpO2 01/18/23 1813 100 %     Weight --      Height --      Head Circumference --      Peak Flow --      Pain Score 01/18/23 1811 5     Pain Loc --      Pain Education --      Exclude from Growth Chart --    No data found.  Updated Vital Signs BP (!) 149/89 (BP Location: Right Arm)   Pulse 78   Temp 97.6 F (36.4 C) (Oral)   Resp 19   LMP  (LMP Unknown)   SpO2 100%   Physical Exam Vitals and nursing note reviewed.  Constitutional:      General: She is not in acute distress.    Appearance: Normal appearance. She is not ill-appearing or toxic-appearing.  HENT:     Head: Normocephalic and atraumatic.     Right Ear: A middle ear effusion is present.     Left Ear: A middle ear effusion is present.     Nose: Congestion present.     Mouth/Throat:     Mouth: Mucous membranes are moist.     Pharynx: Oropharynx is clear. Posterior oropharyngeal erythema present.  Eyes:     General: No scleral icterus.       Right eye: No discharge.        Left eye: No discharge.     Conjunctiva/sclera: Conjunctivae normal.  Cardiovascular:     Rate and Rhythm: Normal rate and regular rhythm.     Heart sounds: Normal heart sounds.  Pulmonary:     Effort: Pulmonary effort is normal. No respiratory distress.     Breath sounds: Normal breath sounds.  Musculoskeletal:     Cervical back: Neck supple.  Skin:    General: Skin is dry.  Neurological:     General: No focal deficit present.     Mental  Status: She is alert. Mental status is at baseline.     Motor: No weakness.     Gait: Gait normal.  Psychiatric:        Mood and Affect: Mood normal.        Behavior: Behavior normal.        Thought Content: Thought content normal.      UC Treatments / Results  Labs (all labs ordered are listed, but only abnormal results are displayed) Labs Reviewed - No data to display  EKG   Radiology No results found.  Procedures Procedures (including critical care time)  Medications Ordered in UC Medications - No data to display  Initial Impression / Assessment and Plan / UC Course  I have reviewed the triage vital signs and the nursing notes.  Pertinent labs & imaging results that were available during my care of the patient were reviewed by me and considered in my medical decision making (see chart for details).   62 year old female presents for sinus pressure, congestion, nosebleeds, cough, fatigue, headaches, body aches x 10 days.  Symptoms are getting worse as time goes on.  Denies any wheezing or temps above 100 degrees.  She is afebrile.  Appears well overall.  On exam has effusion of bilateral TMs, nasal congestion, erythema posterior pharynx with postnasal drainage.  Chest clear.  Heart regular rate and rhythm.  Suspect bacterial sinusitis.  Treating at this time with cefdinir as she has pruritus related to usage of penicillin and Augmentin.  Has tolerated cefdinir.  Encouraged increasing rest and  fluids and continue Mucinex.  Vies her to return if she has a higher fever, worsening cough, increased shortness of breath or weakness.   Final Clinical Impressions(s) / UC Diagnoses   Final diagnoses:  Acute sinusitis, recurrence not specified, unspecified location  Nasal congestion  Sore throat     Discharge Instructions      -I sent antibiotics to the pharmacy for your sinus infection. - May continue Mucinex, rest and fluids. - Return if fever, worsening cough, shortness  of breath worsens.    ED Prescriptions     Medication Sig Dispense Auth. Provider   cefdinir (OMNICEF) 300 MG capsule Take 1 capsule (300 mg total) by mouth 2 (two) times daily for 7 days. 14 capsule Shirlee Latch, PA-C      PDMP not reviewed this encounter.   Shirlee Latch, PA-C 01/18/23 [phone]

## 2023-01-19 ENCOUNTER — Other Ambulatory Visit: Payer: Self-pay | Admitting: Internal Medicine

## 2023-01-19 DIAGNOSIS — E1169 Type 2 diabetes mellitus with other specified complication: Secondary | ICD-10-CM

## 2023-01-19 DIAGNOSIS — B009 Herpesviral infection, unspecified: Secondary | ICD-10-CM

## 2023-01-19 DIAGNOSIS — F39 Unspecified mood [affective] disorder: Secondary | ICD-10-CM

## 2023-01-19 DIAGNOSIS — I10 Essential (primary) hypertension: Secondary | ICD-10-CM

## 2023-01-19 DIAGNOSIS — G8929 Other chronic pain: Secondary | ICD-10-CM

## 2023-01-19 DIAGNOSIS — E118 Type 2 diabetes mellitus with unspecified complications: Secondary | ICD-10-CM

## 2023-01-21 NOTE — Telephone Encounter (Signed)
Requested Prescriptions  Pending Prescriptions Disp Refills   valACYclovir (VALTREX) 1000 MG tablet [Pharmacy Med Name: VALACYCLOVIR HCL 1 GRAM TABLET] 20 tablet 0    Sig: TAKE 1 TABLET BY MOUTH TWICE A DAY     Antimicrobials:  Antiviral Agents - Anti-Herpetic Passed - 01/19/2023 10:06 AM      Passed - Valid encounter within last 12 months    Recent Outpatient Visits           2 months ago Type II diabetes mellitus with complication [facility])   [place] [facility] at [facility], Nyoka Cowden, MD   5 months ago Annual physical exam   [facility] at [facility], Nyoka Cowden, MD   9 months ago Type II diabetes mellitus with complication [facility])   [place] Primary Care & Sports Medicine at [facility], Nyoka Cowden, MD   1 year ago Type II diabetes mellitus with complication [facility])   [place] Primary Care & Sports Medicine at [facility], Nyoka Cowden, MD   1 year ago Annual physical exam   [facility] Health Primary Care & Sports Medicine at [facility], Nyoka Cowden, MD       Future Appointments             In 2 months Judithann Graves Nyoka Cowden, MD [facility] Primary Care & Sports [facility] at [facility], [facility]   In 6 months Reubin Milan, MD [facility] Health Primary Care & Sports Medicine at [facility], [facility]             Dulaglutide (TRULICITY) 0.75 MG/0.5ML [place] Broad [Pharmacy Med Name: TRULICITY 0.75 MG/0.5 ML PEN] 6 mL 0    Sig: INJECT 0.75 MG SUBCUTANEOUSLY ONE TIME PER WEEK     Endocrinology:  Diabetes - GLP-1 Receptor Agonists Passed - 01/19/2023 10:06 AM      Passed - HBA1C is between 0 and 7.9 and within 180 days    Hemoglobin A1C  Date Value Ref Range Status  11/20/2022 7.1 (A) 4.0 - 5.6 % Final   HB A1C (BAYER DCA - WAIVED)  Date Value Ref Range Status  07/28/2014 6.9 <7.0 % Final    Comment:                                          Diabetic Adult            <7.0                                        Healthy Adult        4.3 - 5.7                                                           (DCCT/NGSP) American Diabetes Association's Summary of Glycemic Recommendations for Adults with Diabetes: Hemoglobin A1c <7.0%. More stringent glycemic goals (A1c <6.0%) may further reduce complications at the cost of increased risk of hypoglycemia.    Hgb A1c MFr Bld  Date Value Ref Range Status  08/01/2022 7.2 (H)  4.8 - 5.6 % Final    Comment:             Prediabetes: 5.7 - 6.4          Diabetes: >6.4          Glycemic control for adults with diabetes: <7.0          Passed - Valid encounter within last 6 months    Recent Outpatient Visits           2 months ago Type II diabetes mellitus with complication [facility])   [place] [facility] Medicine at [facility], Nyoka Cowden, MD   5 months ago Annual physical exam   [facility] Primary [facility] & Sports Medicine at [facility], Nyoka Cowden, MD   9 months ago Type II diabetes mellitus with complication [facility])   [place] Primary Care & Sports Medicine at [facility], Nyoka Cowden, MD   1 year ago Type II diabetes mellitus with complication [facility])   [place] [facility] Care & Sports Medicine at [facility], Nyoka Cowden, MD   1 year ago Annual physical exam   [facility] at [facility], Nyoka Cowden, MD       Future Appointments             In 2 months Judithann Graves Nyoka Cowden, MD [facility] Primary [facility] & [facility] at [facility], [facility]   In 6 months Judithann Graves Nyoka Cowden, MD [facility] Health Primary Care & Sports [facility] at [facility], [facility]             meloxicam (MOBIC) 15 MG tablet [Pharmacy Med Name: MELOXICAM 15 MG TABLET] 30 tablet 0    Sig: TAKE 1 TABLET (15 MG TOTAL) BY MOUTH DAILY.     Analgesics:  COX2 Inhibitors Failed - 01/19/2023 10:06 AM      Failed - Manual Review: Labs are only required if the patient has  taken medication for more than 8 weeks.      Passed - HGB in normal range and within 360 days    Hemoglobin  Date Value Ref Range Status  08/01/2022 14.0 11.1 - 15.9 g/dL Final         Passed - Cr in normal range and within 360 days    Creatinine  Date Value Ref Range Status  07/08/2013 0.92 0.60 - 1.30 mg/dL Final   Creatinine, Ser  Date Value Ref Range Status  08/01/2022 0.89 0.57 - 1.00 mg/dL Final         Passed - HCT in normal range and within 360 days    Hematocrit  Date Value Ref Range Status  08/01/2022 41.4 34.0 - 46.6 % Final         Passed - AST in normal range and within 360 days    AST  Date Value Ref Range Status  08/01/2022 20 0 - 40 IU/L Final   SGOT(AST)  Date Value Ref Range Status  07/08/2013 18 15 - 37 Unit/L Final         Passed - ALT in normal range and within 360 days    ALT  Date Value Ref Range Status  08/01/2022 23 0 - 32 IU/L Final   SGPT (ALT)  Date Value Ref Range Status  07/08/2013 16 12 - 78 U/L Final         Passed - eGFR is 30 or above and within 360 days  EGFR (African American)  Date Value Ref Range Status  07/08/2013 >60  Final   GFR calc Af Amer  Date Value Ref Range Status  07/21/2019 89 >59 mL/min/1.73 Final    Comment:    **[facility] currently reports eGFR in compliance with the current**   recommendations of the [employer]. [facility] will   update reporting as new guidelines are published from the NKF-ASN   Task force.    EGFR (Non-African Amer.)  Date Value Ref Range Status  07/08/2013 >60  Final    Comment:    eGFR values <46mL/min/1.73 m2 may be an indication of chronic kidney disease (CKD). Calculated eGFR is useful in patients with stable renal function. The eGFR calculation will not be reliable in acutely ill patients when serum creatinine is changing rapidly. It is not useful in  patients on dialysis. The eGFR calculation may not be applicable to patients at the low and high extremes of  body sizes, pregnant women, and vegetarians.    GFR, Estimated  Date Value Ref Range Status  05/06/2021 51 (L) >60 mL/min Final    Comment:    (NOTE) Calculated using the CKD-EPI Creatinine Equation (2021)    eGFR  Date Value Ref Range Status  08/01/2022 72 >59 mL/min/1.73 Final         Passed - Patient is not pregnant      Passed - Valid encounter within last 12 months    Recent Outpatient Visits           2 months ago Type II diabetes mellitus with complication [facility])   [place] [facility] at [facility], Nyoka Cowden, MD   5 months ago Annual physical exam   [facility] Primary [facility] & Sports Medicine at [facility], Nyoka Cowden, MD   9 months ago Type II diabetes mellitus with complication [facility])   [place] Primary Care & Sports Medicine at [facility], Nyoka Cowden, MD   1 year ago Type II diabetes mellitus with complication [facility])   [place] Primary Care & Sports Medicine at [facility], Nyoka Cowden, MD   1 year ago Annual physical exam   [facility] Health Primary Care & Sports Medicine at [facility], Nyoka Cowden, MD       Future Appointments             In 2 months Judithann Graves Nyoka Cowden, MD [facility] Health Primary Care & Sports Medicine at [facility], [facility]   In 6 months Reubin Milan, MD [facility] Health Primary Care & Sports Medicine at [facility], [facility]             atorvastatin (LIPITOR) 40 MG tablet [Pharmacy Med Name: ATORVASTATIN 40 MG TABLET] 90 tablet 1    Sig: TAKE 1 TABLET BY MOUTH EVERY DAY     Cardiovascular:  Antilipid - Statins Failed - 01/19/2023 10:06 AM      Failed - Lipid Panel in normal range within the last 12 months    Cholesterol, Total  Date Value Ref Range Status  08/01/2022 169 100 - 199 mg/dL Final   Cholesterol Piccolo, Waived  Date Value Ref Range Status  07/28/2014 CANCELED mg/dL     Comment:    No green top heparin tube submitted.                          Desirable                <  200                         Borderline High      200- 239                         High                     >239  Result canceled by the ancillary    LDL Chol Calc (NIH)  Date Value Ref Range Status  08/01/2022 63 0 - 99 mg/dL Final   HDL  Date Value Ref Range Status  08/01/2022 49 >39 mg/dL Final   Triglycerides  Date Value Ref Range Status  08/01/2022 368 (H) 0 - 149 mg/dL Final   Triglycerides Piccolo,Waived  Date Value Ref Range Status  07/28/2014 CANCELED      Comment:    Test not performed  Result canceled by the ancillary          Passed - Patient is not pregnant      Passed - Valid encounter within last 12 months    Recent Outpatient Visits           2 months ago Type II diabetes mellitus with complication [facility])   [place] [facility] Sports Medicine at [facility], Nyoka Cowden, MD   5 months ago Annual physical exam   [facility] Primary [facility] & Sports Medicine at [facility], Nyoka Cowden, MD   9 months ago Type II diabetes mellitus with complication [facility])   [place] Primary Care & Sports Medicine at [facility], Nyoka Cowden, MD   1 year ago Type II diabetes mellitus with complication [facility])   [place] Primary Care & Sports Medicine at [facility], Nyoka Cowden, MD   1 year ago Annual physical exam   [facility] Health Primary [facility] & Sports [facility] at [facility], Nyoka Cowden, MD       Future Appointments             In 2 months Reubin Milan, MD [facility] Primary Care & Sports Medicine at [facility], [facility]   In 6 months Reubin Milan, MD [facility] Health Primary Care & Sports Medicine at [facility], [facility]             losartan (COZAAR) 100 MG tablet [Pharmacy Med Name: LOSARTAN POTASSIUM 100 MG TAB] 90 tablet 0    Sig: TAKE 1 TABLET BY MOUTH EVERY DAY     Cardiovascular:  Angiotensin Receptor Blockers Failed - 01/19/2023 10:06 AM      Failed - K in normal  range and within 180 days    Potassium  Date Value Ref Range Status  08/01/2022 5.4 (H) 3.5 - 5.2 mmol/L Final  07/08/2013 3.5 3.5 - 5.1 mmol/L Final         Failed - Last BP in normal range    BP Readings from Last 1 Encounters:  01/18/23 (!) 149/89         Passed - Cr in normal range and within 180 days    Creatinine  Date Value Ref Range Status  07/08/2013 0.92 0.60 - 1.30 mg/dL Final   Creatinine, Ser  Date Value Ref Range Status  08/01/2022 0.89 0.57 - 1.00 mg/dL Final         Passed - Patient is not pregnant  Passed - Valid encounter within last 6 months    Recent Outpatient Visits           2 months ago Type II diabetes mellitus with complication [facility])   [place] [facility] Sports Medicine at [facility], Nyoka Cowden, MD   5 months ago Annual physical exam   [facility] Primary [facility] & Sports [facility] at [facility], Nyoka Cowden, MD   9 months ago Type II diabetes mellitus with complication [facility])   [place] Primary Care & Sports Medicine at [facility], Nyoka Cowden, MD   1 year ago Type II diabetes mellitus with complication [facility])    Primary Care & Sports Medicine at [facility], Nyoka Cowden, MD   1 year ago Annual physical exam   [facility] Health Primary Care & Sports Medicine at [facility], Nyoka Cowden, MD       Future Appointments             In 2 months Judithann Graves Nyoka Cowden, MD [facility] Primary Care & Sports Medicine at [facility], [facility]   In 6 months Judithann Graves Nyoka Cowden, MD [facility] Health Primary Care & Sports Medicine at [facility], [facility]             DULoxetine (CYMBALTA) 60 MG capsule [Pharmacy Med Name: DULOXETINE HCL DR 60 MG CAP] 180 capsule 0    Sig: TAKE 1 CAPSULE BY MOUTH TWICE A DAY     Psychiatry: Antidepressants - SNRI - duloxetine Failed - 01/19/2023 10:06 AM      Failed - Last BP in normal range    BP Readings from Last 1 Encounters:  01/18/23 (!) 149/89          Passed - Cr in normal range and within 360 days    Creatinine  Date Value Ref Range Status  07/08/2013 0.92 0.60 - 1.30 mg/dL Final   Creatinine, Ser  Date Value Ref Range Status  08/01/2022 0.89 0.57 - 1.00 mg/dL Final         Passed - eGFR is 30 or above and within 360 days    EGFR (African American)  Date Value Ref Range Status  07/08/2013 >60  Final   GFR calc Af Amer  Date Value Ref Range Status  07/21/2019 89 >59 mL/min/1.73 Final    Comment:    **[facility] currently reports eGFR in compliance with the current**   recommendations of the [employer]. [facility] will   update reporting as new guidelines are published from the NKF-ASN   Task force.    EGFR (Non-African Amer.)  Date Value Ref Range Status  07/08/2013 >60  Final    Comment:    eGFR values <86mL/min/1.73 m2 may be an indication of chronic kidney disease (CKD). Calculated eGFR is useful in patients with stable renal function. The eGFR calculation will not be reliable in acutely ill patients when serum creatinine is changing rapidly. It is not useful in  patients on dialysis. The eGFR calculation may not be applicable to patients at the low and high extremes of body sizes, pregnant women, and vegetarians.    GFR, Estimated  Date Value Ref Range Status  05/06/2021 51 (L) >60 mL/min Final    Comment:    (NOTE) Calculated using the CKD-EPI Creatinine Equation (2021)    eGFR  Date Value Ref Range Status  08/01/2022 72 >59 mL/min/1.73 Final         Passed - Completed PHQ-2  or PHQ-9 in the last 360 days      Passed - Valid encounter within last 6 months    Recent Outpatient Visits           2 months ago Type II diabetes mellitus with complication [facility])   [place] [facility] Sports Medicine at [facility], Nyoka Cowden, MD   5 months ago Annual physical exam   [facility] Health Primary Care & Sports Medicine at [facility], Nyoka Cowden, MD   9 months ago Type II  diabetes mellitus with complication [facility])   [place] Primary Care & Sports Medicine at [facility], Nyoka Cowden, MD   1 year ago Type II diabetes mellitus with complication [facility])   [place] Primary Care & Sports Medicine at [facility], Nyoka Cowden, MD   1 year ago Annual physical exam   [facility] Health Primary Care & Sports Medicine at [facility], Nyoka Cowden, MD       Future Appointments             In 2 months Judithann Graves Nyoka Cowden, MD [facility] Health Primary Care & Sports Medicine at [facility], [facility]   In 6 months Reubin Milan, MD [facility] Health Primary Care & Sports Medicine at [facility], [facility]             metoprolol succinate (TOPROL-XL) 100 MG 24 hr tablet [Pharmacy Med Name: METOPROLOL SUCC ER 100 MG TAB] 90 tablet 0    Sig: TAKE 0.5 TABLETS (50 MG TOTAL) BY MOUTH IN THE MORNING AND AT BEDTIME.     Cardiovascular:  Beta Blockers Failed - 01/19/2023 10:06 AM      Failed - Last BP in normal range    BP Readings from Last 1 Encounters:  01/18/23 (!) 149/89         Passed - Last Heart Rate in normal range    Pulse Readings from Last 1 Encounters:  01/18/23 78         Passed - Valid encounter within last 6 months    Recent Outpatient Visits           2 months ago Type II diabetes mellitus with complication (HCC)   [place] [facility] Care & Sports Medicine at [facility], Nyoka Cowden, MD   5 months ago Annual physical exam   [facility] Primary [facility] & Sports Medicine at [facility], Nyoka Cowden, MD   9 months ago Type II diabetes mellitus with complication [facility])   [place] Primary Care & Sports Medicine at [facility], Nyoka Cowden, MD   1 year ago Type II diabetes mellitus with complication [facility])    Primary Care & Sports Medicine at [facility], Nyoka Cowden, MD   1 year ago Annual physical exam   [facility] Health Primary Care & Sports Medicine at [facility], Nyoka Cowden,  MD       Future Appointments             In 2 months Judithann Graves, Nyoka Cowden, MD [facility] Health Primary Care & Sports Medicine at [facility]   In 6 months Judithann Graves, Nyoka Cowden, MD [facility] Health Primary Care & Sports Medicine at [facility], [facility]

## 2023-03-08 ENCOUNTER — Other Ambulatory Visit: Payer: Self-pay | Admitting: Internal Medicine

## 2023-03-08 DIAGNOSIS — F411 Generalized anxiety disorder: Secondary | ICD-10-CM

## 2023-03-08 NOTE — Telephone Encounter (Signed)
Requested Prescriptions  Pending Prescriptions Disp Refills   busPIRone (BUSPAR) 10 MG tablet [Pharmacy Med Name: BUSPIRONE HCL 10 MG TABLET] 270 tablet 1    Sig: TAKE 1 TABLET BY MOUTH THREE TIMES A DAY     Psychiatry: Anxiolytics/Hypnotics - Non-controlled Passed - 03/08/2023 12:42 PM      Passed - Valid encounter within last 12 months    Recent Outpatient Visits           3 months ago Type II diabetes mellitus with complication [facility])   [place] Primary Care & Sports Medicine at [facility], Nyoka Cowden, MD   7 months ago Annual physical exam   [place] [facility] Health Primary Care & Sports Medicine at [facility], Nyoka Cowden, MD   11 months ago Type II diabetes mellitus with complication [facility])   [place] Primary Care & Sports Medicine at [facility], Nyoka Cowden, MD   1 year ago Type II diabetes mellitus with complication [facility])   [place] Primary Care & Sports Medicine at [facility], Nyoka Cowden, MD   1 year ago Annual physical exam   [facility] Primary Care & Sports Medicine at [facility], Nyoka Cowden, MD       Future Appointments             In 2 weeks Judithann Graves, Nyoka Cowden, MD [facility] Health Primary Care & Sports Medicine at [facility], [facility]   In 5 months Judithann Graves, Nyoka Cowden, MD [facility] Health Primary Care & Sports Medicine at [facility], [facility]

## 2023-03-09 ENCOUNTER — Other Ambulatory Visit: Payer: Self-pay | Admitting: Internal Medicine

## 2023-03-09 DIAGNOSIS — F39 Unspecified mood [affective] disorder: Secondary | ICD-10-CM

## 2023-03-09 DIAGNOSIS — E118 Type 2 diabetes mellitus with unspecified complications: Secondary | ICD-10-CM

## 2023-03-12 ENCOUNTER — Other Ambulatory Visit: Payer: Self-pay | Admitting: Internal Medicine

## 2023-03-12 DIAGNOSIS — Z1231 Encounter for screening mammogram for malignant neoplasm of breast: Secondary | ICD-10-CM

## 2023-03-12 NOTE — Telephone Encounter (Signed)
Requested Prescriptions  Pending Prescriptions Disp Refills   metFORMIN (GLUCOPHAGE-XR) 500 MG 24 hr tablet [Pharmacy Med Name: METFORMIN HCL ER 500 MG TABLET] 90 tablet 0    Sig: TAKE 1 TABLET BY MOUTH EVERY DAY WITH BREAKFAST     Endocrinology:  Diabetes - Biguanides Failed - 03/12/2023  9:03 AM      Failed - B12 Level in normal range and within 720 days    No results found for: "VITAMINB12"       Passed - Cr in normal range and within 360 days    Creatinine  Date Value Ref Range Status  07/08/2013 0.92 0.60 - 1.30 mg/dL Final   Creatinine, Ser  Date Value Ref Range Status  08/01/2022 0.89 0.57 - 1.00 mg/dL Final         Passed - HBA1C is between 0 and 7.9 and within 180 days    Hemoglobin A1C  Date Value Ref Range Status  11/20/2022 7.1 (A) 4.0 - 5.6 % Final   HB A1C (BAYER DCA - WAIVED)  Date Value Ref Range Status  07/28/2014 6.9 <7.0 % Final    Comment:                                          Diabetic Adult            <7.0                                       Healthy Adult        4.3 - 5.7                                                           (DCCT/NGSP) American Diabetes Association's Summary of Glycemic Recommendations for Adults with Diabetes: Hemoglobin A1c <7.0%. More stringent glycemic goals (A1c <6.0%) may further reduce complications at the cost of increased risk of hypoglycemia.    Hgb A1c MFr Bld  Date Value Ref Range Status  08/01/2022 7.2 (H) 4.8 - 5.6 % Final    Comment:             Prediabetes: 5.7 - 6.4          Diabetes: >6.4          Glycemic control for adults with diabetes: <7.0          Passed - eGFR in normal range and within 360 days    EGFR (African American)  Date Value Ref Range Status  07/08/2013 >60  Final   GFR calc Af Amer  Date Value Ref Range Status  07/21/2019 89 >59 mL/min/1.73 Final    Comment:    **[facility] currently reports eGFR in compliance with the current**   recommendations of the [employer].  [facility] will   update reporting as new guidelines are published from the NKF-ASN   Task force.    EGFR (Non-African Amer.)  Date Value Ref Range Status  07/08/2013 >60  Final    Comment:    eGFR values <75mL/min/1.73 m2 may be an indication of chronic kidney disease (CKD). Calculated eGFR is useful in  patients with stable renal function. The eGFR calculation will not be reliable in acutely ill patients when serum creatinine is changing rapidly. It is not useful in  patients on dialysis. The eGFR calculation may not be applicable to patients at the low and high extremes of body sizes, pregnant women, and vegetarians.    GFR, Estimated  Date Value Ref Range Status  05/06/2021 51 (L) >60 mL/min Final    Comment:    (NOTE) Calculated using the CKD-EPI Creatinine Equation (2021)    eGFR  Date Value Ref Range Status  08/01/2022 72 >59 mL/min/1.73 Final         Passed - Valid encounter within last 6 months    Recent Outpatient Visits           3 months ago Type II diabetes mellitus with complication [facility])   [place] [facility] at [facility], Nyoka Cowden, MD   7 months ago Annual physical exam   [facility] at [facility], Nyoka Cowden, MD   11 months ago Type II diabetes mellitus with complication [facility])   [place] [facility] Medicine at [facility], Nyoka Cowden, MD   1 year ago Type II diabetes mellitus with complication [facility])   [place] [facility] Care & Sports Medicine at [facility], Nyoka Cowden, MD   1 year ago Annual physical exam   [facility] Primary Care & [facility] at [facility], Nyoka Cowden, MD       Future Appointments             In 2 weeks Judithann Graves Nyoka Cowden, MD [facility] Health Primary Care & Sports Medicine at [facility], [facility]   In 4 months Reubin Milan, MD [facility] Primary Care & [facility] Medicine at [facility], [facility]             Passed - CBC within normal limits and completed in the last 12 months    WBC  Date Value Ref Range Status  08/01/2022 7.8 3.4 - 10.8 x10E3/uL Final  05/06/2021 9.6 4.0 - 10.5 K/uL Final   RBC  Date Value Ref Range Status  08/01/2022 4.71 3.77 - 5.28 x10E6/uL Final  05/06/2021 4.97 3.87 - 5.11 MIL/uL Final   Hemoglobin  Date Value Ref Range Status  08/01/2022 14.0 11.1 - 15.9 g/dL Final   Hematocrit  Date Value Ref Range Status  08/01/2022 41.4 34.0 - 46.6 % Final   MCHC  Date Value Ref Range Status  08/01/2022 33.8 31.5 - 35.7 g/dL Final  16/11/9602 54.0 30.0 - 36.0 g/dL Final   [facility]  Date Value Ref Range Status  08/01/2022 29.7 26.6 - 33.0 pg Final  05/06/2021 29.0 26.0 - 34.0 pg Final   MCV  Date Value Ref Range Status  08/01/2022 88 79 - 97 fL Final  09/28/2013 85 80 - 100 fL Final   No results found for: "PLTCOUNTKUC", "LABPLAT", "POCPLA" RDW  Date Value Ref Range Status  08/01/2022 13.0 11.7 - 15.4 % Final  09/28/2013 12.9 11.5 - 14.5 % Final         Refused Prescriptions Disp Refills   DULoxetine (CYMBALTA) 60 MG capsule [Pharmacy Med Name: DULOXETINE HCL DR 60 MG CAP] 90 capsule     Sig: TAKE 1 CAPSULE BY MOUTH EVERY DAY     Psychiatry: Antidepressants - SNRI - duloxetine Failed - 03/12/2023  9:03 AM  Failed - Last BP in normal range    BP Readings from Last 1 Encounters:  01/18/23 (!) 149/89         Passed - Cr in normal range and within 360 days    Creatinine  Date Value Ref Range Status  07/08/2013 0.92 0.60 - 1.30 mg/dL Final   Creatinine, Ser  Date Value Ref Range Status  08/01/2022 0.89 0.57 - 1.00 mg/dL Final         Passed - eGFR is 30 or above and within 360 days    EGFR (African American)  Date Value Ref Range Status  07/08/2013 >60  Final   GFR calc Af Amer  Date Value Ref Range Status  07/21/2019 89 >59 mL/min/1.73 Final    Comment:    **[facility] currently reports eGFR in compliance with the current**    recommendations of the [employer]. [facility] will   update reporting as new guidelines are published from the NKF-ASN   Task force.    EGFR (Non-African Amer.)  Date Value Ref Range Status  07/08/2013 >60  Final    Comment:    eGFR values <42mL/min/1.73 m2 may be an indication of chronic kidney disease (CKD). Calculated eGFR is useful in patients with stable renal function. The eGFR calculation will not be reliable in acutely ill patients when serum creatinine is changing rapidly. It is not useful in  patients on dialysis. The eGFR calculation may not be applicable to patients at the low and high extremes of body sizes, pregnant women, and vegetarians.    GFR, Estimated  Date Value Ref Range Status  05/06/2021 51 (L) >60 mL/min Final    Comment:    (NOTE) Calculated using the CKD-EPI Creatinine Equation (2021)    eGFR  Date Value Ref Range Status  08/01/2022 72 >59 mL/min/1.73 Final         Passed - Completed PHQ-2 or PHQ-9 in the last 360 days      Passed - Valid encounter within last 6 months    Recent Outpatient Visits           3 months ago Type II diabetes mellitus with complication [facility])   [place] [facility] Medicine at [facility], Nyoka Cowden, MD   7 months ago Annual physical exam   [facility] Primary Care & Sports Medicine at [place], Nyoka Cowden, MD   11 months ago Type II diabetes mellitus with complication [facility])   [place] Primary Care & Sports Medicine at [facility], Nyoka Cowden, MD   1 year ago Type II diabetes mellitus with complication [facility])   [place] [facility] Sports Medicine at [facility], Nyoka Cowden, MD   1 year ago Annual physical exam   [facility] Primary Care & Sports [facility] at [facility], Nyoka Cowden, MD       Future Appointments             In 2 weeks Judithann Graves Nyoka Cowden, MD [facility] Health Primary Care & Sports Medicine at [facility],  [facility]   In 4 months Judithann Graves Nyoka Cowden, MD [facility] Health Primary Care & Sports Medicine at [facility], [facility]             TRULICITY 0.75 MG/0.5ML [place] [Pharmacy Med Name: TRULICITY 0.75 MG/0.5 ML PEN]      Sig: INJECT 0.75 MG SUBCUTANEOUSLY ONE TIME PER WEEK     Endocrinology:  Diabetes - GLP-1 Receptor  Agonists Passed - 03/12/2023  9:03 AM      Passed - HBA1C is between 0 and 7.9 and within 180 days    Hemoglobin A1C  Date Value Ref Range Status  11/20/2022 7.1 (A) 4.0 - 5.6 % Final   HB A1C (BAYER DCA - WAIVED)  Date Value Ref Range Status  07/28/2014 6.9 <7.0 % Final    Comment:                                          Diabetic Adult            <7.0                                       Healthy Adult        4.3 - 5.7                                                           (DCCT/NGSP) American Diabetes Association's Summary of Glycemic Recommendations for Adults with Diabetes: Hemoglobin A1c <7.0%. More stringent glycemic goals (A1c <6.0%) may further reduce complications at the cost of increased risk of hypoglycemia.    Hgb A1c MFr Bld  Date Value Ref Range Status  08/01/2022 7.2 (H) 4.8 - 5.6 % Final    Comment:             Prediabetes: 5.7 - 6.4          Diabetes: >6.4          Glycemic control for adults with diabetes: <7.0          Passed - Valid encounter within last 6 months    Recent Outpatient Visits           3 months ago Type II diabetes mellitus with complication [facility])   [place] [facility] Medicine at [facility], Nyoka Cowden, MD   7 months ago Annual physical exam   [facility] Primary [facility] & Sports Medicine at [facility], Nyoka Cowden, MD   11 months ago Type II diabetes mellitus with complication [facility])   [place] [facility] Care & Sports Medicine at [facility], Nyoka Cowden, MD   1 year ago Type II diabetes mellitus with complication [facility])   [place] [facility] Care [facility] Sports Medicine at [facility], Nyoka Cowden, MD   1 year ago Annual physical exam   [facility] Primary Care & Sports Medicine at [facility], Nyoka Cowden, MD       Future Appointments             In 2 weeks Judithann Graves Nyoka Cowden, MD [facility] Primary Care & Sports Medicine at [facility], [facility]   In 4 months Reubin Milan, MD [facility] Health Primary Care & Sports Medicine at [facility], [facility]             metoprolol succinate (TOPROL-XL) 100 MG 24 hr tablet [Pharmacy Med Name: METOPROLOL SUCC ER 100 MG TAB] 90 tablet 0    Sig: TAKE 0.5 TABLETS (50 MG TOTAL) BY MOUTH IN  THE MORNING AND AT BEDTIME.     Cardiovascular:  Beta Blockers Failed - 03/12/2023  9:03 AM      Failed - Last BP in normal range    BP Readings from Last 1 Encounters:  01/18/23 (!) 149/89         Passed - Last Heart Rate in normal range    Pulse Readings from Last 1 Encounters:  01/18/23 78         Passed - Valid encounter within last 6 months    Recent Outpatient Visits           3 months ago Type II diabetes mellitus with complication (HCC)   [place] [facility] at [facility], Nyoka Cowden, MD   7 months ago Annual physical exam   [facility] Health Primary Care & Sports Medicine at [facility], Nyoka Cowden, MD   11 months ago Type II diabetes mellitus with complication [facility])   [place] Primary [facility] Medicine at [facility], Nyoka Cowden, MD   1 year ago Type II diabetes mellitus with complication [facility])    Primary Care & Sports Medicine at [facility], Nyoka Cowden, MD   1 year ago Annual physical exam   [facility] Health Primary Care & Sports Medicine at [facility], Nyoka Cowden, MD       Future Appointments             In 2 weeks Judithann Graves, Nyoka Cowden, MD [facility] Health Primary Care & Sports Medicine at [facility], [facility]   In 4 months Judithann Graves, Nyoka Cowden, MD [facility] Health Primary Care & Sports Medicine at [facility], [facility]

## 2023-03-27 ENCOUNTER — Ambulatory Visit
Admission: RE | Admit: 2023-03-27 | Discharge: 2023-03-27 | Disposition: A | Discharge status: Home / Self Care | Payer: PRIVATE HEALTH INSURANCE | Source: Ambulatory Visit | Attending: Internal Medicine | Admitting: Internal Medicine

## 2023-03-27 ENCOUNTER — Encounter: Payer: Self-pay | Admitting: Internal Medicine

## 2023-03-27 ENCOUNTER — Ambulatory Visit: Payer: PRIVATE HEALTH INSURANCE | Admitting: Internal Medicine

## 2023-03-27 ENCOUNTER — Ambulatory Visit: Payer: PRIVATE HEALTH INSURANCE

## 2023-03-27 ENCOUNTER — Ambulatory Visit
Admission: RE | Admit: 2023-03-27 | Discharge: 2023-03-27 | Disposition: A | Discharge status: Home / Self Care | Payer: PRIVATE HEALTH INSURANCE | Attending: Internal Medicine | Admitting: Internal Medicine

## 2023-03-27 VITALS — BP 126/74 | HR 86 | Ht 64.0 in | Wt 127.6 lb

## 2023-03-27 DIAGNOSIS — M778 Other enthesopathies, not elsewhere classified: Secondary | ICD-10-CM

## 2023-03-27 DIAGNOSIS — J4520 Mild intermittent asthma, uncomplicated: Secondary | ICD-10-CM

## 2023-03-27 DIAGNOSIS — N1831 Chronic kidney disease, stage 3a: Secondary | ICD-10-CM

## 2023-03-27 DIAGNOSIS — E785 Hyperlipidemia, unspecified: Secondary | ICD-10-CM

## 2023-03-27 DIAGNOSIS — E118 Type 2 diabetes mellitus with unspecified complications: Secondary | ICD-10-CM

## 2023-03-27 DIAGNOSIS — J45909 Unspecified asthma, uncomplicated: Secondary | ICD-10-CM | POA: Insufficient documentation

## 2023-03-27 DIAGNOSIS — Z7984 Long term (current) use of oral hypoglycemic drugs: Secondary | ICD-10-CM

## 2023-03-27 DIAGNOSIS — E1169 Type 2 diabetes mellitus with other specified complication: Secondary | ICD-10-CM

## 2023-03-27 DIAGNOSIS — I10 Essential (primary) hypertension: Secondary | ICD-10-CM

## 2023-03-27 DIAGNOSIS — F324 Major depressive disorder, single episode, in partial remission: Secondary | ICD-10-CM | POA: Diagnosis not present

## 2023-03-27 MED ORDER — FLUTICASONE-SALMETEROL 100-50 MCG/ACT IN AEPB: 1.0000 | INHALATION_SPRAY | Freq: Two times a day (BID) | RESPIRATORY_TRACT | 3 refills | Status: DC

## 2023-03-27 MED ORDER — METHOCARBAMOL 500 MG PO TABS: 500.0000 mg | ORAL_TABLET | Freq: Every day | ORAL | 0 refills | Status: DC

## 2023-03-27 NOTE — Assessment & Plan Note (Signed)
No injury noted - pain from upper arm into neck. Recommend Mobic daily and robaxin at bedtime If no improvement, would refer to Dr. Ashley Royalty

## 2023-03-27 NOTE — Assessment & Plan Note (Signed)
Hx of asthma as a young adult but was able to stop inhalers years ago. Recent recurrence of symptoms with chest tightness and cough, esp with exertion. Will get CXR, start Advair Follow up if no improvement

## 2023-03-27 NOTE — Assessment & Plan Note (Signed)
Controlled BP with normal exam. Current regimen is amlodipine, losartan and metoprolol. Will continue same medications; encourage continued reduced sodium diet.

## 2023-03-27 NOTE — Progress Notes (Signed)
Date:  03/27/2023   Name:  Tracy Bryan   DOB:  Jun 28, 1960   MRN:  [id number]   Chief Complaint: Medical Management of Chronic Issues (Patient presents today for her follow up on diabetes and HTN. She is feeling well today and would like to talk about her breathing and needle like feeling when she tries to take deep breaths. She has been taking her medications as directed. )  Diabetes She presents for her follow-up diabetic visit. She has type 2 diabetes mellitus. Her disease course has been stable. Pertinent negatives for hypoglycemia include no headaches or tremors. Pertinent negatives for diabetes include no chest pain, no fatigue, no polydipsia and no polyuria. Symptoms are stable.  Shortness of Breath Pertinent negatives include no abdominal pain, chest pain, fever, headaches or leg swelling.  Shoulder Pain  The pain is present in the right shoulder. This is a new problem. The current episode started 1 to 4 weeks ago. The problem occurs daily. The problem has been unchanged. The quality of the pain is described as burning and aching. The pain is mild. Pertinent negatives include no fever or numbness. She has tried acetaminophen for the symptoms. The treatment provided mild relief.    Review of Systems  Constitutional:  Negative for appetite change, fatigue, fever and unexpected weight change.  HENT:  Negative for tinnitus and trouble swallowing.   Eyes:  Negative for visual disturbance.  Respiratory:  Positive for shortness of breath. Negative for cough and chest tightness.   Cardiovascular:  Negative for chest pain, palpitations and leg swelling.  Gastrointestinal:  Negative for abdominal pain.  Endocrine: Negative for polydipsia and polyuria.  Genitourinary:  Negative for dysuria and hematuria.  Musculoskeletal:  Positive for arthralgias (right shoulder and neck pain).  Neurological:  Negative for tremors, numbness and headaches.  Psychiatric/Behavioral:  Negative for  dysphoric mood.      Lab Results  Component Value Date   NA 140 08/01/2022   K 5.4 (H) 08/01/2022   CO2 22 08/01/2022   GLUCOSE 122 (H) 08/01/2022   BUN 14 08/01/2022   CREATININE 0.89 08/01/2022   CALCIUM 9.7 08/01/2022   EGFR 72 08/01/2022   GFRNONAA 51 (L) 05/06/2021   Lab Results  Component Value Date   CHOL 169 08/01/2022   HDL 49 08/01/2022   LDLCALC 63 08/01/2022   TRIG 368 (H) 08/01/2022   CHOLHDL 3.4 08/01/2022   Lab Results  Component Value Date   TSH 1.280 08/01/2022   Lab Results  Component Value Date   HGBA1C 7.1 (A) 11/20/2022   Lab Results  Component Value Date   WBC 7.8 08/01/2022   HGB 14.0 08/01/2022   HCT 41.4 08/01/2022   MCV 88 08/01/2022   PLT 297 08/01/2022   Lab Results  Component Value Date   ALT 23 08/01/2022   AST 20 08/01/2022   ALKPHOS 71 08/01/2022   BILITOT 0.3 08/01/2022   Lab Results  Component Value Date   VD25OH 52.8 05/28/2017     Patient Active Problem List   Diagnosis Date Noted   Reactive airway disease 03/27/2023   Shoulder tendonitis, right 03/27/2023   Chronic pain of both knees 11/20/2022   Stage 3a chronic kidney disease (HCC) 08/01/2022   Major depressive disorder with single episode, 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   Muscle tension headache 03/30/2020   Status post total hysterectomy 03/23/2020   Sleep disorder 11/10/2019   Bilateral carpal tunnel syndrome 11/07/2016   Herpes simplex infection  11/23/2015   Fibromyalgia 03/11/2015   Menopause syndrome 02/17/2015   Esophageal reflux 02/17/2015   Vitamin D deficiency 02/17/2015   Hyperlipidemia associated with type 2 diabetes mellitus (HCC) 07/28/2014   Essential hypertension 07/28/2014   Type II diabetes mellitus with complication (HCC) 05/18/2014    Allergies  Allergen Reactions   Rabeprazole Sodium     C Diff Side Affects   Augmentin [Amoxicillin-Pot Clavulanate] Itching   Benazepril Hcl Cough   Penicillins Other (See Comments)    Jardiance [Empagliflozin] Rash    Recurrent yeast vaginitis    Past Surgical History:  Procedure Laterality Date   CERVICAL DISC SURGERY     COLONOSCOPY  07/22/2013   DILATION AND CURETTAGE OF UTERUS  1996   HERNIA REPAIR  2000   Umblical   HIP ARTHROPLASTY Right    TOTAL ABDOMINAL HYSTERECTOMY     TOTAL HIP ARTHROPLASTY Left 05/18/2014   Procedure: LEFT TOTAL HIP ARTHROPLASTY ANTERIOR APPROACH;  Surgeon: Marcene Corning, MD;  Location: [facility] OR;  Service: Orthopedics;  Laterality: Left;    Social History   Tobacco Use   Smoking status: Former    Current packs/day: 0.00    Types: Cigarettes    Quit date: 06/14/1984    Years since quitting: 38.8   Smokeless tobacco: Never  Vaping Use   Vaping status: Never Used  Substance Use Topics   Alcohol use: No    Alcohol/week: 0.0 standard drinks of alcohol   Drug use: No     Medication list has been reviewed and updated.  Current Meds  Medication Sig   amLODipine (NORVASC) 5 MG tablet TAKE 1 TABLET (5 MG TOTAL) BY MOUTH DAILY.   aspirin 81 MG tablet Take 81 mg by mouth daily.   atorvastatin (LIPITOR) 40 MG tablet TAKE 1 TABLET BY MOUTH EVERY DAY   busPIRone (BUSPAR) 10 MG tablet TAKE 1 TABLET BY MOUTH THREE TIMES A DAY   Cholecalciferol (VITAMIN D) 2000 units CAPS Take 1 capsule (2,000 Units total) by mouth daily.   Dulaglutide (TRULICITY) 0.75 MG/0.5ML SOAJ INJECT 0.75 MG SUBCUTANEOUSLY ONE TIME PER WEEK   DULoxetine (CYMBALTA) 60 MG capsule TAKE 1 CAPSULE BY MOUTH TWICE A DAY   fluticasone-salmeterol (ADVAIR) 100-50 MCG/ACT AEPB Inhale 1 puff into the lungs 2 (two) times daily.   losartan (COZAAR) 100 MG tablet TAKE 1 TABLET BY MOUTH EVERY DAY   metFORMIN (GLUCOPHAGE-XR) 500 MG 24 hr tablet TAKE 1 TABLET BY MOUTH EVERY DAY WITH BREAKFAST   methocarbamol (ROBAXIN) 500 MG tablet Take 1 tablet (500 mg total) by mouth at bedtime.   metoprolol succinate (TOPROL-XL) 100 MG 24 hr tablet TAKE 0.5 TABLETS (50 MG TOTAL) BY MOUTH IN THE  MORNING AND AT BEDTIME.   Multiple Vitamins-Minerals (MULTIVITAMIN WITH MINERALS) tablet Take 1 tablet by mouth daily.   omeprazole (PRILOSEC) 20 MG capsule TAKE 1 CAPSULE (20 MG TOTAL) BY MOUTH 2 (TWO) TIMES DAILY BEFORE A MEAL.       03/27/2023   11:34 AM 11/20/2022   11:03 AM 08/01/2022    8:05 AM 04/02/2022   10:41 AM  GAD 7 : Generalized Anxiety Score  Nervous, Anxious, on Edge 2 2 2 1   Control/stop worrying 2 2 2 2   Worry too much - different things 2 2 2 2   Trouble relaxing 1 2 2 1   Restless 1 2 2 1   Easily annoyed or irritable 1 2 2 1   Afraid - awful might happen 0 1 1 0  Total GAD 7  Score 9 13 13 8   Anxiety Difficulty Not difficult at all Very difficult Very difficult Somewhat difficult       03/27/2023   11:34 AM 11/20/2022   11:03 AM 08/01/2022    8:05 AM  Depression screen PHQ 2/9  Decreased Interest 1 1 2   Down, Depressed, Hopeless 1 1 0  PHQ - 2 Score 2 2 2   Altered sleeping 0 2 2  Tired, decreased energy 1 1 2   Change in appetite 0 2 1  Feeling bad or failure about yourself  0 1 0  Trouble concentrating 0 1 2  Moving slowly or fidgety/restless 1 2 1   Suicidal thoughts 0 0 0  PHQ-9 Score 4 11 10   Difficult doing work/chores Not difficult at all Somewhat difficult Very difficult    BP Readings from Last 3 Encounters:  03/27/23 126/74  01/18/23 (!) 149/89  11/20/22 112/68    Physical Exam Vitals and nursing note reviewed.  Constitutional:      General: She is not in acute distress.    Appearance: Normal appearance. She is well-developed.  HENT:     Head: Normocephalic and atraumatic.  Neck:     Vascular: No carotid bruit.  Cardiovascular:     Rate and Rhythm: Normal rate and regular rhythm.  Pulmonary:     Effort: Pulmonary effort is normal. No respiratory distress.     Breath sounds: Normal breath sounds. No wheezing or rhonchi.  Musculoskeletal:     Right shoulder: Tenderness present. Decreased range of motion.     Left shoulder: Normal.      Cervical back: Normal range of motion.     Right lower leg: No edema.     Left lower leg: No edema.  Lymphadenopathy:     Cervical: No cervical adenopathy.  Skin:    General: Skin is warm and dry.     Findings: No rash.  Neurological:     General: No focal deficit present.     Mental Status: She is alert and oriented to person, place, and time.  Psychiatric:        Mood and Affect: Mood normal.        Behavior: Behavior normal.     Wt Readings from Last 3 Encounters:  03/27/23 127 lb 9.6 oz (57.9 kg)  11/20/22 124 lb 9.6 oz (56.5 kg)  08/01/22 121 lb 3.2 oz (55 kg)    BP 126/74   Pulse 86   Ht 5\' 4"  (1.626 m)   Wt 127 lb 9.6 oz (57.9 kg)   LMP  (LMP Unknown)   SpO2 98%   BMI 21.90 kg/m   Assessment and Plan:  Problem List Items Addressed This Visit       Unprioritized   Type II diabetes mellitus with complication (HCC) (Chronic)   Blood sugars stable without hypoglycemic symptoms or events. Currently managed with Trulicity and MTF. Changes made last visit are none. Lab Results  Component Value Date   HGBA1C 7.1 (A) 11/20/2022  Recheck A1C and advise       Relevant Orders   Microalbumin / creatinine urine ratio   Hemoglobin A1c   Hyperlipidemia associated with type 2 diabetes mellitus (HCC) (Chronic)   Essential hypertension - Primary (Chronic)   Controlled BP with normal exam. Current regimen is amlodipine, losartan and metoprolol. Will continue same medications; encourage continued reduced sodium diet.       Major depressive disorder with single episode, in partial remission (HCC) (Chronic)   Clinically stable  on Buspar and Cymbalta with good response, No SI or HI reported. No change in management at this time.       Stage 3a chronic kidney disease (HCC)   Reactive airway disease   Hx of asthma as a young adult but was able to stop inhalers years ago. Recent recurrence of symptoms with chest tightness and cough, esp with exertion. Will get CXR,  start Advair Follow up if no improvement      Relevant Medications   fluticasone-salmeterol (ADVAIR) 100-50 MCG/ACT AEPB   Other Relevant Orders   DG Chest 2 View   Shoulder tendonitis, right   No injury noted - pain from upper arm into neck. Recommend Mobic daily and robaxin at bedtime If no improvement, would refer to Dr. Ashley Royalty      Relevant Medications   methocarbamol (ROBAXIN) 500 MG tablet   Other Visit Diagnoses       Long term current use of oral hypoglycemic drug           No follow-ups on file.    Reubin Milan, MD [facility] Health Primary Care and Sports Medicine [place]

## 2023-03-27 NOTE — Assessment & Plan Note (Addendum)
Clinically stable on Buspar and Cymbalta with good response, No SI or HI reported. No change in management at this time.

## 2023-03-27 NOTE — Assessment & Plan Note (Addendum)
Blood sugars stable without hypoglycemic symptoms or events. Currently managed with Trulicity and MTF. Changes made last visit are none. Lab Results  Component Value Date   HGBA1C 7.1 (A) 11/20/2022  Recheck A1C and advise

## 2023-03-28 LAB — MICROALBUMIN / CREATININE URINE RATIO
Creatinine, Urine: 234.1 mg/dL
Microalb/Creat Ratio: 522 mg/g{creat} — ABNORMAL HIGH (ref 0–29)
Microalbumin, Urine: 1220.9 ug/mL

## 2023-03-28 LAB — HEMOGLOBIN A1C
Est. average glucose Bld gHb Est-mCnc: 189 mg/dL
Hgb A1c MFr Bld: 8.2 % — ABNORMAL HIGH (ref 4.8–5.6)

## 2023-03-28 LAB — SPECIMEN STATUS REPORT

## 2023-03-29 ENCOUNTER — Telehealth: Payer: Self-pay | Admitting: Internal Medicine

## 2023-03-29 NOTE — Telephone Encounter (Signed)
Caller states due to new insurance, insurance will require a PA for Dulaglutide (TRULICITY) 0.75 MG/0.5ML . Caller will be due for a refill in 2 weeks and caller would like PA initiated at this time. Caller states he thinks PCP was going to dbl the dose at the next refill. Please advise caller directly regarding request.

## 2023-04-01 ENCOUNTER — Other Ambulatory Visit: Payer: Self-pay | Admitting: Internal Medicine

## 2023-04-01 ENCOUNTER — Telehealth: Payer: Self-pay

## 2023-04-01 DIAGNOSIS — E118 Type 2 diabetes mellitus with unspecified complications: Secondary | ICD-10-CM

## 2023-04-01 MED ORDER — TRULICITY 1.5 MG/0.5ML ~~LOC~~ SOAJ: 1.5000 mg | SUBCUTANEOUS | 1 refills | Status: DC

## 2023-04-01 NOTE — Telephone Encounter (Signed)
Completed PA on [email or web address] for Trulicity 1.5 mg.   (Key: [id number])  Awaiting outcome.

## 2023-04-01 NOTE — Telephone Encounter (Signed)
Completed PA today.  (Key: [id number])  Awaiting outcome.

## 2023-04-02 NOTE — Telephone Encounter (Signed)
Received info to fax office visit notes, and labs. Sent Fax. Awaiting outcome.

## 2023-04-03 ENCOUNTER — Telehealth: Payer: Self-pay | Admitting: Internal Medicine

## 2023-04-03 NOTE — Telephone Encounter (Signed)
The husband for Tracy Bryan is here to send a message for prior authorization on Trulicity to be covered by insurance [email or web address].

## 2023-04-03 NOTE — Telephone Encounter (Signed)
 Notified patient of message.

## 2023-04-10 ENCOUNTER — Ambulatory Visit
Admission: RE | Admit: 2023-04-10 | Discharge: 2023-04-10 | Disposition: A | Discharge status: Home / Self Care | Payer: PRIVATE HEALTH INSURANCE | Source: Ambulatory Visit | Attending: Internal Medicine | Admitting: Internal Medicine

## 2023-04-10 DIAGNOSIS — Z1231 Encounter for screening mammogram for malignant neoplasm of breast: Secondary | ICD-10-CM | POA: Diagnosis present

## 2023-04-11 NOTE — Telephone Encounter (Signed)
 Approved 04/11/2023. Exp: 04/08/2024.   PA# [id number]

## 2023-04-11 NOTE — Telephone Encounter (Signed)
 Pt informed PA is approved on her VM.   Copied from CRM [id number]. Topic: Clinical - Prescription Issue >> Apr 10, 2023  3:24 PM Ivette P wrote: Reason for CRM: Pt called in because she has a new insurance and need a Prior Authorization for medication Dulaglutide (TRULICITY) 1.5 MG/0.5ML SOAJ . Pt would like an update once PA is approved. [id number]

## 2023-04-22 ENCOUNTER — Other Ambulatory Visit: Payer: Self-pay | Admitting: Internal Medicine

## 2023-04-22 DIAGNOSIS — M778 Other enthesopathies, not elsewhere classified: Secondary | ICD-10-CM

## 2023-04-23 NOTE — Telephone Encounter (Signed)
 Requested Prescriptions  Pending Prescriptions Disp Refills   metoprolol succinate (TOPROL-XL) 100 MG 24 hr tablet [Pharmacy Med Name: METOPROLOL SUCC ER 100 MG TAB] 30 tablet 0    Sig: TAKE 0.5 TABLETS (50 MG TOTAL) BY MOUTH IN THE MORNING AND AT BEDTIME.     Cardiovascular:  Beta Blockers Passed - 04/23/2023  2:33 PM      Passed - Last BP in normal range    BP Readings from Last 1 Encounters:  03/27/23 126/74         Passed - Last Heart Rate in normal range    Pulse Readings from Last 1 Encounters:  03/27/23 86         Passed - Valid encounter within last 6 months    Recent Outpatient Visits           5 months ago Type II diabetes mellitus with complication [facility])   [place] [facility] at [facility], Nyoka Cowden, MD   8 months ago Annual physical exam   [facility] at [facility], Nyoka Cowden, MD   1 year ago Type II diabetes mellitus with complication [facility])   [place] [facility] Medicine at [facility], Nyoka Cowden, MD   1 year ago Type II diabetes mellitus with complication [facility])   [place] [facility] at [facility], Nyoka Cowden, MD   1 year ago Annual physical exam   [facility] Primary [facility] at [facility], Nyoka Cowden, MD       Future Appointments             In 3 months Judithann Graves Nyoka Cowden, MD [facility] Primary Care & Sports Medicine at [facility], [facility]             methocarbamol (ROBAXIN) 500 MG tablet [Pharmacy Med Name: METHOCARBAMOL 500 MG TABLET] 30 tablet 0    Sig: TAKE 1 TABLET BY MOUTH AT BEDTIME.     Not Delegated - Analgesics:  Muscle Relaxants Failed - 04/23/2023  2:33 PM      Failed - This refill cannot be delegated      Passed - Valid encounter within last 6 months    Recent Outpatient Visits           5 months ago Type II diabetes mellitus with complication [facility])   [place]  Primary Care & Sports Medicine at [facility], Nyoka Cowden, MD   8 months ago Annual physical exam   [facility] at [facility], Nyoka Cowden, MD   1 year ago Type II diabetes mellitus with complication [facility])   [place] [facility] & [facility] Medicine at [facility], Nyoka Cowden, MD   1 year ago Type II diabetes mellitus with complication [facility])   [place] Primary Care & Sports Medicine at [facility], Nyoka Cowden, MD   1 year ago Annual physical exam   [facility] Primary Care & Sports Medicine at [facility], Nyoka Cowden, MD       Future Appointments             In 3 months Judithann Graves, Nyoka Cowden, MD [facility] Health Primary Care & Sports Medicine at [facility], [facility]

## 2023-04-23 NOTE — Telephone Encounter (Signed)
 Requested medication (s) are due for refill today - yes  Requested medication (s) are on the active medication list -yes  Future visit scheduled -yes  Last refill: 03/27/23 #30  Notes to clinic: non delegated Rx  Requested Prescriptions  Pending Prescriptions Disp Refills   methocarbamol (ROBAXIN) 500 MG tablet [Pharmacy Med Name: METHOCARBAMOL 500 MG TABLET] 30 tablet 0    Sig: TAKE 1 TABLET BY MOUTH AT BEDTIME.     Not Delegated - Analgesics:  Muscle Relaxants Failed - 04/23/2023  2:35 PM      Failed - This refill cannot be delegated      Passed - Valid encounter within last 6 months    Recent Outpatient Visits           5 months ago Type II diabetes mellitus with complication [facility])   [place] [facility] at [facility], Nyoka Cowden, MD   8 months ago Annual physical exam   [facility] at [facility], Nyoka Cowden, MD   1 year ago Type II diabetes mellitus with complication [facility])   [place] [facility] Care & Sports Medicine at [facility], Nyoka Cowden, MD   1 year ago Type II diabetes mellitus with complication [facility])   [place] Primary Care & Sports Medicine at [facility], Nyoka Cowden, MD   1 year ago Annual physical exam   [facility] Primary Care & Sports Medicine at [facility], Nyoka Cowden, MD       Future Appointments             In 3 months Reubin Milan, MD [facility] Primary Care & Sports Medicine at [facility], [facility]            Signed Prescriptions Disp Refills   metoprolol succinate (TOPROL-XL) 100 MG 24 hr tablet 30 tablet 0    Sig: TAKE 0.5 TABLETS (50 MG TOTAL) BY MOUTH IN THE MORNING AND AT BEDTIME.     Cardiovascular:  Beta Blockers Passed - 04/23/2023  2:35 PM      Passed - Last BP in normal range    BP Readings from Last 1 Encounters:  03/27/23 126/74         Passed - Last Heart Rate in normal range    Pulse Readings from Last 1  Encounters:  03/27/23 86         Passed - Valid encounter within last 6 months    Recent Outpatient Visits           5 months ago Type II diabetes mellitus with complication [facility])   [place] [facility] Care [facility] Sports Medicine at [facility], Nyoka Cowden, MD   8 months ago Annual physical exam   [facility] at [facility], Nyoka Cowden, MD   1 year ago Type II diabetes mellitus with complication [facility])   [place] Primary Care & Sports Medicine at [facility], Nyoka Cowden, MD   1 year ago Type II diabetes mellitus with complication [facility])   [place] [facility] Care & Sports Medicine at [facility], Nyoka Cowden, MD   1 year ago Annual physical exam   [facility] at [facility], Nyoka Cowden, MD       Future Appointments             In 3 months Bari Edward  Rexene Edison, MD [facility] Health Primary Care & Sports Medicine at [facility], [facility]               Requested Prescriptions  Pending Prescriptions Disp Refills   methocarbamol (ROBAXIN) 500 MG tablet [Pharmacy Med Name: METHOCARBAMOL 500 MG TABLET] 30 tablet 0    Sig: TAKE 1 TABLET BY MOUTH AT BEDTIME.     Not Delegated - Analgesics:  Muscle Relaxants Failed - 04/23/2023  2:35 PM      Failed - This refill cannot be delegated      Passed - Valid encounter within last 6 months    Recent Outpatient Visits           5 months ago Type II diabetes mellitus with complication [facility])   [place] [facility] at [facility], Nyoka Cowden, MD   8 months ago Annual physical exam   [facility] Primary Care & Sports Medicine at [facility], Nyoka Cowden, MD   1 year ago Type II diabetes mellitus with complication [facility])   [place] Primary Care & Sports Medicine at [facility], Nyoka Cowden, MD   1 year ago Type II diabetes mellitus with complication [facility])   [place] Primary Care  & Sports Medicine at [facility], Nyoka Cowden, MD   1 year ago Annual physical exam   [facility] Health Primary Care & Sports Medicine at [facility], Nyoka Cowden, MD       Future Appointments             In 3 months Reubin Milan, MD [facility] Primary Care & Sports Medicine at [facility], [facility]            Signed Prescriptions Disp Refills   metoprolol succinate (TOPROL-XL) 100 MG 24 hr tablet 30 tablet 0    Sig: TAKE 0.5 TABLETS (50 MG TOTAL) BY MOUTH IN THE MORNING AND AT BEDTIME.     Cardiovascular:  Beta Blockers Passed - 04/23/2023  2:35 PM      Passed - Last BP in normal range    BP Readings from Last 1 Encounters:  03/27/23 126/74         Passed - Last Heart Rate in normal range    Pulse Readings from Last 1 Encounters:  03/27/23 86         Passed - Valid encounter within last 6 months    Recent Outpatient Visits           5 months ago Type II diabetes mellitus with complication [facility])   [place] [facility] Sports Medicine at [facility], Nyoka Cowden, MD   8 months ago Annual physical exam   [facility] Primary Care & Sports Medicine at [facility], Nyoka Cowden, MD   1 year ago Type II diabetes mellitus with complication [facility])   [place] Primary Care & Sports Medicine at [facility], Nyoka Cowden, MD   1 year ago Type II diabetes mellitus with complication [facility])   [place] Primary Care & Sports Medicine at [facility], Nyoka Cowden, MD   1 year ago Annual physical exam   [facility] Primary Care & Sports Medicine at [facility], Nyoka Cowden, MD       Future Appointments             In 3 months Judithann Graves, Nyoka Cowden, MD [facility] Health Primary Care & Sports Medicine at [facility],  [facility]

## 2023-04-24 NOTE — Telephone Encounter (Signed)
 MED REFILL.

## 2023-05-08 ENCOUNTER — Other Ambulatory Visit: Payer: Self-pay | Admitting: Internal Medicine

## 2023-05-08 DIAGNOSIS — I10 Essential (primary) hypertension: Secondary | ICD-10-CM

## 2023-05-08 DIAGNOSIS — F39 Unspecified mood [affective] disorder: Secondary | ICD-10-CM

## 2023-05-10 NOTE — Telephone Encounter (Signed)
 Requested Prescriptions  Pending Prescriptions Disp Refills   DULoxetine (CYMBALTA) 60 MG capsule [Pharmacy Med Name: DULOXETINE HCL DR 60 MG CAP] 180 capsule 0    Sig: TAKE 1 CAPSULE BY MOUTH TWICE A DAY     Psychiatry: Antidepressants - SNRI - duloxetine Passed - 05/10/2023  8:58 AM      Passed - Cr in normal range and within 360 days    Creatinine  Date Value Ref Range Status  07/08/2013 0.92 0.60 - 1.30 mg/dL Final   Creatinine, Ser  Date Value Ref Range Status  08/01/2022 0.89 0.57 - 1.00 mg/dL Final         Passed - eGFR is 30 or above and within 360 days    EGFR (African American)  Date Value Ref Range Status  07/08/2013 >60  Final   GFR calc Af Amer  Date Value Ref Range Status  07/21/2019 89 >59 mL/min/1.73 Final    Comment:    **[facility] currently reports eGFR in compliance with the current**   recommendations of the [employer]. [facility] will   update reporting as new guidelines are published from the NKF-ASN   Task force.    EGFR (Non-African Amer.)  Date Value Ref Range Status  07/08/2013 >60  Final    Comment:    eGFR values <59mL/min/1.73 m2 may be an indication of chronic kidney disease (CKD). Calculated eGFR is useful in patients with stable renal function. The eGFR calculation will not be reliable in acutely ill patients when serum creatinine is changing rapidly. It is not useful in  patients on dialysis. The eGFR calculation may not be applicable to patients at the low and high extremes of body sizes, pregnant women, and vegetarians.    GFR, Estimated  Date Value Ref Range Status  05/06/2021 51 (L) >60 mL/min Final    Comment:    (NOTE) Calculated using the CKD-EPI Creatinine Equation (2021)    eGFR  Date Value Ref Range Status  08/01/2022 72 >59 mL/min/1.73 Final         Passed - Completed PHQ-2 or PHQ-9 in the last 360 days      Passed - Last BP in normal range    BP Readings from Last 1 Encounters:  03/27/23 126/74          Passed - Valid encounter within last 6 months    Recent Outpatient Visits           1 month ago Essential hypertension   [place] [facility] at [facility], Nyoka Cowden, MD       Future Appointments             In 2 months Reubin Milan, MD [facility] Health Primary Care & Sports Medicine at [facility], [facility]             losartan (COZAAR) 100 MG tablet [Pharmacy Med Name: LOSARTAN POTASSIUM 100 MG TAB] 90 tablet 0    Sig: TAKE 1 TABLET BY MOUTH EVERY DAY     Cardiovascular:  Angiotensin Receptor Blockers Failed - 05/10/2023  8:58 AM      Failed - Cr in normal range and within 180 days    Creatinine  Date Value Ref Range Status  07/08/2013 0.92 0.60 - 1.30 mg/dL Final   Creatinine, Ser  Date Value Ref Range Status  08/01/2022 0.89 0.57 - 1.00 mg/dL Final         Failed - K in normal range and  within 180 days    Potassium  Date Value Ref Range Status  08/01/2022 5.4 (H) 3.5 - 5.2 mmol/L Final  07/08/2013 3.5 3.5 - 5.1 mmol/L Final         Passed - Patient is not pregnant      Passed - Last BP in normal range    BP Readings from Last 1 Encounters:  03/27/23 126/74         Passed - Valid encounter within last 6 months    Recent Outpatient Visits           1 month ago Essential hypertension   [place] [facility] at [facility], Nyoka Cowden, MD       Future Appointments             In 2 months Judithann Graves, Nyoka Cowden, MD [facility] Primary [facility] Medicine at [facility], [facility]

## 2023-05-19 ENCOUNTER — Other Ambulatory Visit: Payer: Self-pay | Admitting: Internal Medicine

## 2023-05-20 NOTE — Telephone Encounter (Signed)
 Requested Prescriptions  Pending Prescriptions Disp Refills   metoprolol succinate (TOPROL-XL) 100 MG 24 hr tablet [Pharmacy Med Name: METOPROLOL SUCC ER 100 MG TAB] 90 tablet 0    Sig: TAKE 0.5 TABLETS (50 MG TOTAL) BY MOUTH IN THE MORNING AND AT BEDTIME.     Cardiovascular:  Beta Blockers Passed - 05/20/2023  3:22 PM      Passed - Last BP in normal range    BP Readings from Last 1 Encounters:  03/27/23 126/74         Passed - Last Heart Rate in normal range    Pulse Readings from Last 1 Encounters:  03/27/23 86         Passed - Valid encounter within last 6 months    Recent Outpatient Visits           1 month ago Essential hypertension   [place] [facility] Care & Sports Medicine at [facility], Nyoka Cowden, MD       Future Appointments             In 2 months Judithann Graves, Nyoka Cowden, MD [facility] Health Primary Care & Sports Medicine at [facility], [facility]

## 2023-05-23 ENCOUNTER — Other Ambulatory Visit: Payer: Self-pay | Admitting: Internal Medicine

## 2023-05-23 DIAGNOSIS — M778 Other enthesopathies, not elsewhere classified: Secondary | ICD-10-CM

## 2023-05-24 NOTE — Telephone Encounter (Signed)
 Requested medication (s) are due for refill today - yes  Requested medication (s) are on the active medication list -yes  Future visit scheduled -yes  Last refill: 04/24/23 #30  Notes to clinic: non delegated Rx  Requested Prescriptions  Pending Prescriptions Disp Refills   methocarbamol (ROBAXIN) 500 MG tablet [Pharmacy Med Name: METHOCARBAMOL 500 MG TABLET] 30 tablet 0    Sig: TAKE 1 TABLET BY MOUTH AT BEDTIME.     Not Delegated - Analgesics:  Muscle Relaxants Failed - 05/24/2023  9:17 AM      Failed - This refill cannot be delegated      Passed - Valid encounter within last 6 months    Recent Outpatient Visits           1 month ago Essential hypertension   [place] [facility] Medicine at [facility], Nyoka Cowden, MD       Future Appointments             In 2 months Judithann Graves Nyoka Cowden, MD [facility] Primary Care & Sports Medicine at [facility], [facility]               Requested Prescriptions  Pending Prescriptions Disp Refills   methocarbamol (ROBAXIN) 500 MG tablet [Pharmacy Med Name: METHOCARBAMOL 500 MG TABLET] 30 tablet 0    Sig: TAKE 1 TABLET BY MOUTH AT BEDTIME.     Not Delegated - Analgesics:  Muscle Relaxants Failed - 05/24/2023  9:17 AM      Failed - This refill cannot be delegated      Passed - Valid encounter within last 6 months    Recent Outpatient Visits           1 month ago Essential hypertension   [place] Primary Care & Sports Medicine at [facility], Nyoka Cowden, MD       Future Appointments             In 2 months Judithann Graves, Nyoka Cowden, MD [facility] Health Primary Care & Sports Medicine at [facility], [facility]

## 2023-05-27 ENCOUNTER — Ambulatory Visit
Admission: EM | Admit: 2023-05-27 | Discharge: 2023-05-27 | Disposition: A | Discharge status: Home / Self Care | Payer: PRIVATE HEALTH INSURANCE | Attending: Family Medicine | Admitting: Family Medicine

## 2023-05-27 DIAGNOSIS — J069 Acute upper respiratory infection, unspecified: Secondary | ICD-10-CM | POA: Insufficient documentation

## 2023-05-27 DIAGNOSIS — H6121 Impacted cerumen, right ear: Secondary | ICD-10-CM | POA: Insufficient documentation

## 2023-05-27 LAB — GROUP A STREP BY PCR: Group A Strep by PCR: NOT DETECTED

## 2023-05-27 LAB — RESP PANEL BY RT-PCR (RSV, FLU A&B, COVID)  RVPGX2
Influenza A by PCR: NEGATIVE
Influenza B by PCR: NEGATIVE
Resp Syncytial Virus by PCR: NEGATIVE
SARS Coronavirus 2 by RT PCR: NEGATIVE

## 2023-05-27 MED ORDER — IPRATROPIUM BROMIDE 0.06 % NA SOLN: 2.0000 | Freq: Four times a day (QID) | NASAL | 12 refills | Status: DC

## 2023-05-27 MED ORDER — HYDROCOD POLI-CHLORPHE POLI ER 10-8 MG/5ML PO SUER: 5.0000 mL | Freq: Two times a day (BID) | ORAL | 0 refills | Status: DC | PRN

## 2023-05-27 NOTE — Discharge Instructions (Addendum)
 Tracy Bryan's strep, RSV, influenza and COVID are all negative. You have a viral respiratory infection that will gradually improve over the next 7-10 days. Cough may last up to 3 weeks.    You can take Tylenol and/or Ibuprofen as needed for fever reduction and pain relief.    For cough: honey 1/2 to 1 teaspoon (you can dilute the honey in water or another fluid).  Stop at the pharmacy to pick up your prescription cough medication. You can use a humidifier for chest congestion and cough.  If you don't have a humidifier, you can sit in the bathroom with the hot shower running.      For sore throat: try warm salt water gargles, Mucinex sore throat cough drops or cepacol lozenges, throat spray, warm tea or water with lemon/honey, popsicles or ice, or OTC cold relief medicine for throat discomfort. You can also purchase chloraseptic spray at the pharmacy or dollar store.   For congestion: Use the prescription nasal spray. take a daily anti-histamine like Zyrtec, Claritin, and a oral decongestant, such as pseudoephedrine.  You can also use Flonase 1-2 sprays in each nostril daily.    It is important to stay hydrated: drink plenty of fluids (water, gatorade/powerade/pedialyte, juices, or teas) to keep your throat moisturized and help further relieve irritation/discomfort.    Return or go to the Emergency Department if symptoms worsen or do not improve in the next few days  Stop by the pharmacy to pick up Debrox for earwax removal at home.

## 2023-05-27 NOTE — ED Provider Notes (Signed)
 [facility]    CSN: [id number] Arrival date & time: 05/27/23  1738      History   Chief Complaint Chief Complaint  Patient presents with   Sore Throat   Headache    HPI Tracy Bryan is a 63 y.o. female.   HPI  History obtained from the patient. Amire presents for cough that is getting worse since Friday.  Has headache, facial pain, sore throat and changes in taste. No known sick contacts. No fever, vomiting, diarrhea, belly pain.  Tried taking Benadryl with some relief of nasal congestion and rhinorrhea.      Past Medical History:  Diagnosis Date   Arthritis    Diabetes mellitus without complication (HCC)    Type 2   Disseminated intravascular coagulation (HCC) 1999   After child birth   GERD (gastroesophageal reflux disease)    Herpes simplex    Hypercholesteremia    Hypertension    Migraines    Pneumonia    PONV (postoperative nausea and vomiting)     Patient Active Problem List   Diagnosis Date Noted   Reactive airway disease 03/27/2023   Shoulder tendonitis, right 03/27/2023   Chronic pain of both knees 11/20/2022   Stage 3a chronic kidney disease (HCC) 08/01/2022   Major depressive disorder with single episode, 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   Muscle tension headache 03/30/2020   Status post total hysterectomy 03/23/2020   Sleep disorder 11/10/2019   Bilateral carpal tunnel syndrome 11/07/2016   Herpes simplex infection 11/23/2015   Fibromyalgia 03/11/2015   Menopause syndrome 02/17/2015   Esophageal reflux 02/17/2015   Vitamin D deficiency 02/17/2015   Hyperlipidemia associated with type 2 diabetes mellitus (HCC) 07/28/2014   Essential hypertension 07/28/2014   Type II diabetes mellitus with complication (HCC) 05/18/2014    Past Surgical History:  Procedure Laterality Date   CERVICAL DISC SURGERY     COLONOSCOPY  07/22/2013   DILATION AND CURETTAGE OF UTERUS  1996   HERNIA REPAIR  2000   Umblical   HIP  ARTHROPLASTY Right    TOTAL ABDOMINAL HYSTERECTOMY     TOTAL HIP ARTHROPLASTY Left 05/18/2014   Procedure: LEFT TOTAL HIP ARTHROPLASTY ANTERIOR APPROACH;  Surgeon: Marcene Corning, MD;  Location: [facility] OR;  Service: Orthopedics;  Laterality: Left;    OB History   No obstetric history on file.      Home Medications    Prior to Admission medications   Medication Sig Start Date End Date Taking? Authorizing Provider  amLODipine (NORVASC) 5 MG tablet TAKE 1 TABLET (5 MG TOTAL) BY MOUTH DAILY. 09/24/22  Yes Reubin Milan, MD  aspirin 81 MG tablet Take 81 mg by mouth daily.   Yes [provider]  atorvastatin (LIPITOR) 40 MG tablet TAKE 1 TABLET BY MOUTH EVERY DAY 01/21/23  Yes Reubin Milan, MD  busPIRone (BUSPAR) 10 MG tablet TAKE 1 TABLET BY MOUTH THREE TIMES A DAY 03/08/23  Yes Reubin Milan, MD  chlorpheniramine-HYDROcodone (TUSSIONEX) 10-8 MG/5ML Take 5 mLs by mouth every 12 (twelve) hours as needed. 05/27/23  Yes Nash Bolls, Seward Meth, DO  Cholecalciferol (VITAMIN D) 2000 units CAPS Take 1 capsule (2,000 Units total) by mouth daily. 02/17/15  Yes Plonk, Chrissie Noa, MD  Dulaglutide (TRULICITY) 1.5 MG/0.5ML SOAJ Inject 1.5 mg into the skin once a week. 04/01/23  Yes Reubin Milan, MD  DULoxetine (CYMBALTA) 60 MG capsule TAKE 1 CAPSULE BY MOUTH TWICE A DAY 05/10/23  Yes Reubin Milan, MD  ipratropium (  ATROVENT) 0.06 % nasal spray Place 2 sprays into both nostrils 4 (four) times daily. 05/27/23  Yes Arleigh Odowd, DO  losartan (COZAAR) 100 MG tablet TAKE 1 TABLET BY MOUTH EVERY DAY 05/10/23  Yes Reubin Milan, MD  metFORMIN (GLUCOPHAGE-XR) 500 MG 24 hr tablet TAKE 1 TABLET BY MOUTH EVERY DAY WITH BREAKFAST 03/12/23  Yes Reubin Milan, MD  methocarbamol (ROBAXIN) 500 MG tablet TAKE 1 TABLET BY MOUTH AT BEDTIME. 05/24/23  Yes Reubin Milan, MD  metoprolol succinate (TOPROL-XL) 100 MG 24 hr tablet TAKE 0.5 TABLETS (50 MG TOTAL) BY MOUTH IN THE MORNING AND AT BEDTIME. 05/20/23   Yes Reubin Milan, MD  Multiple Vitamins-Minerals (MULTIVITAMIN WITH MINERALS) tablet Take 1 tablet by mouth daily.   Yes [provider]  omeprazole (PRILOSEC) 20 MG capsule TAKE 1 CAPSULE (20 MG TOTAL) BY MOUTH 2 (TWO) TIMES DAILY BEFORE A MEAL. 08/27/22  Yes Reubin Milan, MD  fluticasone-salmeterol (ADVAIR) 100-50 MCG/ACT AEPB Inhale 1 puff into the lungs 2 (two) times daily. 03/27/23   Reubin Milan, MD  Icosapent Ethyl 0.5 g CAPS TAKE 2 CAPSULES BY MOUTH TWICE A DAY 06/11/22   Reubin Milan, MD  meloxicam (MOBIC) 15 MG tablet TAKE 1 TABLET (15 MG TOTAL) BY MOUTH DAILY. Patient not taking: Reported on 03/27/2023 01/21/23   Reubin Milan, MD  valACYclovir (VALTREX) 1000 MG tablet TAKE 1 TABLET BY MOUTH TWICE A DAY Patient not taking: Reported on 03/27/2023 01/21/23   Reubin Milan, MD    Family History Family History  Problem Relation Age of Onset   Hypertension Mother    Stroke Mother    Hyperlipidemia Mother    Heart disease Mother        heart attack   Diabetes Father    Cancer Father        lung   Diabetes Sister    Hyperlipidemia Sister    Stroke Paternal Grandfather    Hypertension Paternal Grandfather    Stroke Maternal Grandfather    Alzheimer's disease Paternal Grandmother    Diabetes Sister    Cancer Sister        breast   Breast cancer Sister 58   Breast cancer Paternal Aunt     Social History Social History   Tobacco Use   Smoking status: Former    Current packs/day: 0.00    Types: Cigarettes    Quit date: 06/14/1984    Years since quitting: 38.9   Smokeless tobacco: Never  Vaping Use   Vaping status: Never Used  Substance Use Topics   Alcohol use: Not Currently   Drug use: Never     Allergies   Rabeprazole sodium, Augmentin [amoxicillin-pot clavulanate], Benazepril hcl, Penicillins, and Jardiance [empagliflozin]   Review of Systems Review of Systems: negative unless otherwise stated in HPI.      Physical  Exam Triage Vital Signs ED Triage Vitals  Encounter Vitals Group     BP 05/27/23 1808 (!) 140/81     Systolic BP Percentile --      Diastolic BP Percentile --      Pulse Rate 05/27/23 1808 82     Resp 05/27/23 1808 18     Temp 05/27/23 1808 98.7 F (37.1 C)     Temp Source 05/27/23 1808 Oral     SpO2 05/27/23 1808 99 %     Weight 05/27/23 1803 122 lb (55.3 kg)     Height 05/27/23 1803 5' (1.524 m)  Head Circumference --      Peak Flow --      Pain Score 05/27/23 1800 6     Pain Loc --      Pain Education --      Exclude from Growth Chart --    No data found.  Updated Vital Signs BP (!) 140/81 (BP Location: Right Arm)   Pulse 82   Temp 98.7 F (37.1 C) (Oral)   Resp 18   Ht 5' (1.524 m)   Wt 55.3 kg   LMP  (LMP Unknown)   SpO2 99%   BMI 23.83 kg/m   Visual Acuity Right Eye Distance:   Left Eye Distance:   Bilateral Distance:    Right Eye Near:   Left Eye Near:    Bilateral Near:     Physical Exam GEN:     alert, non-toxic appearing female in no distress    HENT:  mucus membranes moist, oropharyngeal without lesions, mild erythema, no tonsillar hypertrophy or exudates, clear nasal discharge, left TM normal, right TM not visible cerumen impaction EYES:   pupils equal and reactive, no scleral injection or discharge RESP:  no increased work of breathing, clear to auscultation bilaterally CVS:   regular rate and rhythm Skin:   warm and dry    UC Treatments / Results  Labs (all labs ordered are listed, but only abnormal results are displayed) Labs Reviewed  GROUP A STREP BY PCR  RESP PANEL BY RT-PCR (RSV, FLU A&B, COVID)  [id number]    EKG   Radiology No results found.  Procedures Procedures (including critical care time) Procedures Ear Cerumen Removal   Date/Time: 01/26/2019 9:08 PM Performed by: nursing staff and Fidel Huddle, DO Authorized by: Fidel Huddle, DO   Consent:    Consent obtained:  Verbal   Consent given by:  Patient   Risks  discussed:  Bleeding, dizziness, infection, incomplete removal, TM perforation and pain   Alternatives discussed:  No treatment Procedure details:    Location:  Right  ear   Procedure type: irrigation  and manual debridement  Post-procedure details:    Inspection:  TM intact   Hearing quality:  Improved   Patient tolerance of procedure:  Tolerated well, no immediate complications   Medications Ordered in UC Medications - No data to display  Initial Impression / Assessment and Plan / UC Course  I have reviewed the triage vital signs and the nursing notes.  Pertinent labs & imaging results that were available during my care of the patient were reviewed by me and considered in my medical decision making (see chart for details).       Pt is a 63 y.o. female who presents for 3 days of respiratory symptoms. Coreen is afebrile here without recent antipyretics. Satting well on room air. Overall pt is non-toxic appearing, well hydrated, without respiratory distress. Pulmonary exam is remarkable for coarse breathe sounds that clear with cough. Strep PCR is negative.   COVID and influenza panel obtained and was negative. History consistent with viral respiratory illness. Discussed symptomatic treatment.  Explained lack of efficacy of antibiotics in viral disease.  Typical duration of symptoms discussed.  Tussionex and Atrovent nasal spray prescribed.  Ceruminosis is noted on the right.  Wax is removed by syringing debridement by the CMA and manual debridement by me.  On reassessment, TM clear and without erythema or bulging. No purulent discharge in canal. Doubt acute otitis media or externa. Hearing has improved. Instructions for home care  to prevent wax buildup are given.  Return and ED precautions given and voiced understanding. Discussed MDM, treatment plan and plan for follow-up with patient who agrees with plan.     Final Clinical Impressions(s) / UC Diagnoses   Final diagnoses:   Impacted cerumen of right ear  Viral URI with cough     Discharge Instructions      Lulamae Skorupski Fedor's strep, RSV, influenza and COVID are all negative. You have a viral respiratory infection that will gradually improve over the next 7-10 days. Cough may last up to 3 weeks.    You can take Tylenol and/or Ibuprofen as needed for fever reduction and pain relief.    For cough: honey 1/2 to 1 teaspoon (you can dilute the honey in water or another fluid).  Stop at the pharmacy to pick up your prescription cough medication. You can use a humidifier for chest congestion and cough.  If you don't have a humidifier, you can sit in the bathroom with the hot shower running.      For sore throat: try warm salt water gargles, Mucinex sore throat cough drops or cepacol lozenges, throat spray, warm tea or water with lemon/honey, popsicles or ice, or OTC cold relief medicine for throat discomfort. You can also purchase chloraseptic spray at the pharmacy or dollar store.   For congestion: Use the prescription nasal spray. take a daily anti-histamine like Zyrtec, Claritin, and a oral decongestant, such as pseudoephedrine.  You can also use Flonase 1-2 sprays in each nostril daily.    It is important to stay hydrated: drink plenty of fluids (water, gatorade/powerade/pedialyte, juices, or teas) to keep your throat moisturized and help further relieve irritation/discomfort.    Return or go to the Emergency Department if symptoms worsen or do not improve in the next few days  Stop by the pharmacy to pick up Debrox for earwax removal at home.        ED Prescriptions     Medication Sig Dispense Auth. Provider   chlorpheniramine-HYDROcodone (TUSSIONEX) 10-8 MG/5ML Take 5 mLs by mouth every 12 (twelve) hours as needed. 115 mL Oliva Montecalvo, DO   ipratropium (ATROVENT) 0.06 % nasal spray Place 2 sprays into both nostrils 4 (four) times daily. 15 mL Kellis Mcadam, DO      I have reviewed the PDMP  during this encounter.   Marlette Curvin, DO 05/30/23 0522

## 2023-05-27 NOTE — ED Triage Notes (Signed)
"  This started with Cough on Friday then ha & sore throat, the cough is making my throat and chest sore, and I don't believe I can taste well". No fever (known).

## 2023-06-03 ENCOUNTER — Encounter: Payer: Self-pay | Admitting: Internal Medicine

## 2023-06-03 ENCOUNTER — Ambulatory Visit: Payer: PRIVATE HEALTH INSURANCE | Admitting: Internal Medicine

## 2023-06-03 VITALS — BP 120/70 | HR 74 | Temp 98.0°F | Ht 60.0 in | Wt 124.1 lb

## 2023-06-03 DIAGNOSIS — J40 Bronchitis, not specified as acute or chronic: Secondary | ICD-10-CM | POA: Diagnosis not present

## 2023-06-03 DIAGNOSIS — E785 Hyperlipidemia, unspecified: Secondary | ICD-10-CM | POA: Diagnosis not present

## 2023-06-03 DIAGNOSIS — Z7984 Long term (current) use of oral hypoglycemic drugs: Secondary | ICD-10-CM

## 2023-06-03 DIAGNOSIS — E1169 Type 2 diabetes mellitus with other specified complication: Secondary | ICD-10-CM

## 2023-06-03 MED ORDER — ALBUTEROL SULFATE HFA 108 (90 BASE) MCG/ACT IN AERS: 2.0000 | INHALATION_SPRAY | Freq: Four times a day (QID) | RESPIRATORY_TRACT | 0 refills | Status: DC | PRN

## 2023-06-03 MED ORDER — CEFDINIR 300 MG PO CAPS: 300.0000 mg | ORAL_CAPSULE | Freq: Two times a day (BID) | ORAL | 0 refills | Status: AC

## 2023-06-03 MED ORDER — PREDNISONE 10 MG PO TABS: ORAL_TABLET | ORAL | 0 refills | Status: AC

## 2023-06-03 MED ORDER — ATORVASTATIN CALCIUM 40 MG PO TABS: 40.0000 mg | ORAL_TABLET | Freq: Every day | ORAL | 1 refills | Status: DC

## 2023-06-03 NOTE — Progress Notes (Signed)
 Date:  06/03/2023   Name:  Tracy Bryan   DOB:  1960/10/05   MRN:  [id number]   Chief Complaint: Cough (Has had the cough for a week now, no mucus, dry cough, no fever, body aches ), Sore Throat, and Wheezing  Cough This is a new problem. The current episode started in the past 7 days. The problem has been unchanged. The problem occurs every few minutes. The cough is Non-productive. Associated symptoms include chest pain, shortness of breath and wheezing. Pertinent negatives include no chills, fever, headaches, myalgias or postnasal drip.  Seen by UC last week.  Covid and Flu negative.  Given Tussionex.   Review of Systems  Constitutional:  Positive for fatigue. Negative for appetite change, chills and fever.  HENT:  Negative for postnasal drip and trouble swallowing.   Respiratory:  Positive for cough, chest tightness, shortness of breath and wheezing.   Cardiovascular:  Positive for chest pain.  Gastrointestinal:  Negative for diarrhea, nausea and vomiting.  Musculoskeletal:  Negative for myalgias.  Neurological:  Negative for dizziness and headaches.  Psychiatric/Behavioral:  Positive for sleep disturbance. Negative for dysphoric mood. The patient is not nervous/anxious.      Lab Results  Component Value Date   NA 140 08/01/2022   K 5.4 (H) 08/01/2022   CO2 22 08/01/2022   GLUCOSE 122 (H) 08/01/2022   BUN 14 08/01/2022   CREATININE 0.89 08/01/2022   CALCIUM  9.7 08/01/2022   EGFR 72 08/01/2022   GFRNONAA 51 (L) 05/06/2021   Lab Results  Component Value Date   CHOL 169 08/01/2022   HDL 49 08/01/2022   LDLCALC 63 08/01/2022   TRIG 368 (H) 08/01/2022   CHOLHDL 3.4 08/01/2022   Lab Results  Component Value Date   TSH 1.280 08/01/2022   Lab Results  Component Value Date   HGBA1C 8.2 (H) 03/27/2023   Lab Results  Component Value Date   WBC 7.8 08/01/2022   HGB 14.0 08/01/2022   HCT 41.4 08/01/2022   MCV 88 08/01/2022   PLT 297 08/01/2022   Lab Results   Component Value Date   ALT 23 08/01/2022   AST 20 08/01/2022   ALKPHOS 71 08/01/2022   BILITOT 0.3 08/01/2022   Lab Results  Component Value Date   VD25OH 52.8 05/28/2017     Patient Active Problem List   Diagnosis Date Noted   Reactive airway disease 03/27/2023   Shoulder tendonitis, right 03/27/2023   Chronic pain of both knees 11/20/2022   Stage 3a chronic kidney disease (HCC) 08/01/2022   Major depressive disorder with single episode, 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   Muscle tension headache 03/30/2020   Status post total hysterectomy 03/23/2020   Sleep disorder 11/10/2019   Bilateral carpal tunnel syndrome 11/07/2016   Herpes simplex infection 11/23/2015   Fibromyalgia 03/11/2015   Menopause syndrome 02/17/2015   Esophageal reflux 02/17/2015   Vitamin D  deficiency 02/17/2015   Hyperlipidemia associated with type 2 diabetes mellitus (HCC) 07/28/2014   Essential hypertension 07/28/2014   Type II diabetes mellitus with complication (HCC) 05/18/2014    Allergies  Allergen Reactions   Rabeprazole  Sodium     C Diff Side Affects   Augmentin  [Amoxicillin -Pot Clavulanate] Itching   Benazepril Hcl Cough   Penicillins Other (See Comments)   Jardiance  [Empagliflozin ] Rash    Recurrent yeast vaginitis    Past Surgical History:  Procedure Laterality Date   CERVICAL DISC SURGERY     COLONOSCOPY  07/22/2013  DILATION AND CURETTAGE OF UTERUS  1996   HERNIA REPAIR  2000   Umblical   HIP ARTHROPLASTY Right    TOTAL ABDOMINAL HYSTERECTOMY     TOTAL HIP ARTHROPLASTY Left 05/18/2014   Procedure: LEFT TOTAL HIP ARTHROPLASTY ANTERIOR APPROACH;  Surgeon: Dayne Even, MD;  Location: [facility] OR;  Service: Orthopedics;  Laterality: Left;    Social History   Tobacco Use   Smoking status: Former    Current packs/day: 0.00    Types: Cigarettes    Quit date: 06/14/1984    Years since quitting: 38.9   Smokeless tobacco: Never  Vaping Use   Vaping status: Never Used   Substance Use Topics   Alcohol use: Not Currently   Drug use: Never     Medication list has been reviewed and updated.  Current Meds  Medication Sig   albuterol  (VENTOLIN  HFA) 108 (90 Base) MCG/ACT inhaler Inhale 2 puffs into the lungs every 6 (six) hours as needed for wheezing or shortness of breath.   amLODipine  (NORVASC ) 5 MG tablet TAKE 1 TABLET (5 MG TOTAL) BY MOUTH DAILY.   aspirin  81 MG tablet Take 81 mg by mouth daily.   busPIRone  (BUSPAR ) 10 MG tablet TAKE 1 TABLET BY MOUTH THREE TIMES A DAY   cefdinir  (OMNICEF ) 300 MG capsule Take 1 capsule (300 mg total) by mouth 2 (two) times daily for 10 days.   chlorpheniramine-HYDROcodone  (TUSSIONEX) 10-8 MG/5ML Take 5 mLs by mouth every 12 (twelve) hours as needed.   Cholecalciferol (VITAMIN D ) 2000 units CAPS Take 1 capsule (2,000 Units total) by mouth daily.   Dulaglutide  (TRULICITY ) 1.5 MG/0.5ML SOAJ Inject 1.5 mg into the skin once a week.   DULoxetine  (CYMBALTA ) 60 MG capsule TAKE 1 CAPSULE BY MOUTH TWICE A DAY   fluticasone -salmeterol (ADVAIR) 100-50 MCG/ACT AEPB Inhale 1 puff into the lungs 2 (two) times daily.   Icosapent  Ethyl 0.5 g CAPS TAKE 2 CAPSULES BY MOUTH TWICE A DAY   ipratropium (ATROVENT ) 0.06 % nasal spray Place 2 sprays into both nostrils 4 (four) times daily.   losartan  (COZAAR ) 100 MG tablet TAKE 1 TABLET BY MOUTH EVERY DAY   meloxicam  (MOBIC ) 15 MG tablet TAKE 1 TABLET (15 MG TOTAL) BY MOUTH DAILY.   metFORMIN  (GLUCOPHAGE -XR) 500 MG 24 hr tablet TAKE 1 TABLET BY MOUTH EVERY DAY WITH BREAKFAST   methocarbamol  (ROBAXIN ) 500 MG tablet TAKE 1 TABLET BY MOUTH AT BEDTIME.   metoprolol  succinate (TOPROL -XL) 100 MG 24 hr tablet TAKE 0.5 TABLETS (50 MG TOTAL) BY MOUTH IN THE MORNING AND AT BEDTIME.   Multiple Vitamins-Minerals (MULTIVITAMIN WITH MINERALS) tablet Take 1 tablet by mouth daily.   omeprazole  (PRILOSEC) 20 MG capsule TAKE 1 CAPSULE (20 MG TOTAL) BY MOUTH 2 (TWO) TIMES DAILY BEFORE A MEAL.   predniSONE   (DELTASONE ) 10 MG tablet Take 6 tablets (60 mg total) by mouth daily with breakfast for 1 day, THEN 5 tablets (50 mg total) daily with breakfast for 1 day, THEN 4 tablets (40 mg total) daily with breakfast for 1 day, THEN 3 tablets (30 mg total) daily with breakfast for 1 day, THEN 2 tablets (20 mg total) daily with breakfast for 1 day, THEN 1 tablet (10 mg total) daily with breakfast for 1 day.   valACYclovir  (VALTREX ) 1000 MG tablet TAKE 1 TABLET BY MOUTH TWICE A DAY   [DISCONTINUED] atorvastatin  (LIPITOR) 40 MG tablet TAKE 1 TABLET BY MOUTH EVERY DAY       06/03/2023   11:22 AM 03/27/2023  11:34 AM 11/20/2022   11:03 AM 08/01/2022    8:05 AM  GAD 7 : Generalized Anxiety Score  Nervous, Anxious, on Edge 2 2 2 2   Control/stop worrying 2 2 2 2   Worry too much - different things 2 2 2 2   Trouble relaxing 2 1 2 2   Restless 2 1 2 2   Easily annoyed or irritable 2 1 2 2   Afraid - awful might happen 0 0 1 1  Total GAD 7 Score 12 9 13 13   Anxiety Difficulty Somewhat difficult Not difficult at all Very difficult Very difficult       06/03/2023   11:22 AM 03/27/2023   11:34 AM 11/20/2022   11:03 AM  Depression screen PHQ 2/9  Decreased Interest 2 1 1   Down, Depressed, Hopeless 1 1 1   PHQ - 2 Score 3 2 2   Altered sleeping 2 0 2  Tired, decreased energy 2 1 1   Change in appetite 1 0 2  Feeling bad or failure about yourself  0 0 1  Trouble concentrating 2 0 1  Moving slowly or fidgety/restless 0 1 2  Suicidal thoughts 0 0 0  PHQ-9 Score 10 4 11   Difficult doing work/chores  Not difficult at all Somewhat difficult    BP Readings from Last 3 Encounters:  06/03/23 120/70  05/27/23 (!) 140/81  03/27/23 126/74    Physical Exam Constitutional:      General: She is not in acute distress. HENT:     Mouth/Throat:     Tonsils: No tonsillar exudate.  Cardiovascular:     Rate and Rhythm: Normal rate and regular rhythm.  Pulmonary:     Effort: Pulmonary effort is normal.     Breath  sounds: Transmitted upper airway sounds present. Examination of the right-upper field reveals rhonchi. Examination of the right-middle field reveals wheezing. Examination of the right-lower field reveals wheezing. Wheezing and rhonchi present.  Musculoskeletal:     Cervical back: Full passive range of motion without pain.  Lymphadenopathy:     Cervical: No cervical adenopathy.  Neurological:     Mental Status: She is alert.     Wt Readings from Last 3 Encounters:  06/03/23 124 lb 2 oz (56.3 kg)  05/27/23 122 lb (55.3 kg)  03/27/23 127 lb 9.6 oz (57.9 kg)    BP 120/70   Pulse 74   Temp 98 F (36.7 C)   Ht 5' (1.524 m)   Wt 124 lb 2 oz (56.3 kg)   LMP  (LMP Unknown)   SpO2 96%   BMI 24.24 kg/m   Assessment and Plan:  Problem List Items Addressed This Visit       Unprioritized   Hyperlipidemia associated with type 2 diabetes mellitus (HCC) (Chronic)   Relevant Medications   atorvastatin  (LIPITOR) 40 MG tablet   Other Visit Diagnoses       Bronchitis    -  Primary   vs CAP.  VS stable, O2 good, no fever will treat with steroid taper, antibiotics, rescuse albuterol  continue Tussionex at hs; follow up prn   Relevant Medications   albuterol  (VENTOLIN  HFA) 108 (90 Base) MCG/ACT inhaler   predniSONE  (DELTASONE ) 10 MG tablet   cefdinir  (OMNICEF ) 300 MG capsule       No follow-ups on file.    Sheron Dixons, MD [facility] [place]

## 2023-06-04 ENCOUNTER — Other Ambulatory Visit: Payer: Self-pay | Admitting: Internal Medicine

## 2023-06-04 DIAGNOSIS — F39 Unspecified mood [affective] disorder: Secondary | ICD-10-CM

## 2023-06-04 DIAGNOSIS — M778 Other enthesopathies, not elsewhere classified: Secondary | ICD-10-CM

## 2023-06-04 DIAGNOSIS — E118 Type 2 diabetes mellitus with unspecified complications: Secondary | ICD-10-CM

## 2023-06-05 NOTE — Telephone Encounter (Signed)
 Requested medication (s) are due for refill today -no  Requested medication (s) are on the active medication list -yes  Future visit scheduled -yes  Last refill: 05/24/23 #30  Notes to clinic: non delegated Rx  Requested Prescriptions  Pending Prescriptions Disp Refills   methocarbamol  (ROBAXIN ) 500 MG tablet [Pharmacy Med Name: METHOCARBAMOL  500 MG TABLET] 30 tablet 0    Sig: TAKE 1 TABLET BY MOUTH AT BEDTIME.     Not Delegated - Analgesics:  Muscle Relaxants Failed - 06/05/2023  1:13 PM      Failed - This refill cannot be delegated      Passed - Valid encounter within last 6 months    Recent Outpatient Visits           2 days ago Bronchitis   [place] [facility] at [facility], Chales [place], MD   2 months ago Essential hypertension   [place] [facility] at [facility], Chales [place], MD       Future Appointments             In 2 months Sheron Dixons, MD [facility] Medicine at [facility], [facility]            Signed Prescriptions Disp Refills   Dulaglutide  (TRULICITY ) 1.5 MG/0.5ML SOAJ 3 mL 1    Sig: INJECT 1.5 MG SUBCUTANEOUSLY ONCE A WEEK     Endocrinology:  Diabetes - GLP-1 Receptor Agonists Failed - 06/05/2023  1:13 PM      Failed - HBA1C is between 0 and 7.9 and within 180 days    HB A1C (BAYER DCA - WAIVED)  Date Value Ref Range Status  07/28/2014 6.9 <7.0 % Final    Comment:                                          Diabetic Adult            <7.0                                       Healthy Adult        4.3 - 5.7                                                           (DCCT/NGSP) American Diabetes Association's Summary of Glycemic Recommendations for Adults with Diabetes: Hemoglobin A1c <7.0%. More stringent glycemic goals (A1c <6.0%) may further reduce complications at the cost of increased risk of hypoglycemia.    Hgb A1c MFr Bld  Date Value Ref Range Status   03/27/2023 8.2 (H) 4.8 - 5.6 % Final    Comment:             Prediabetes: 5.7 - 6.4          Diabetes: >6.4          Glycemic control for adults with diabetes: <7.0          Passed - Valid encounter within last 6 months    Recent Outpatient Visits  2 days ago Bronchitis   [place] [facility] at [facility], Chales [place], MD   2 months ago Essential hypertension   [place] [facility] at [facility], Chales [place], MD       Future Appointments             In 2 months Sheron Dixons, MD [facility] Medicine at [facility], [facility]             metFORMIN  (GLUCOPHAGE -XR) 500 MG 24 hr tablet 90 tablet 0    Sig: TAKE 1 TABLET BY MOUTH EVERY DAY WITH BREAKFAST     Endocrinology:  Diabetes - Biguanides Failed - 06/05/2023  1:13 PM      Failed - HBA1C is between 0 and 7.9 and within 180 days    HB A1C (BAYER DCA - WAIVED)  Date Value Ref Range Status  07/28/2014 6.9 <7.0 % Final    Comment:                                          Diabetic Adult            <7.0                                       Healthy Adult        4.3 - 5.7                                                           (DCCT/NGSP) American Diabetes Association's Summary of Glycemic Recommendations for Adults with Diabetes: Hemoglobin A1c <7.0%. More stringent glycemic goals (A1c <6.0%) may further reduce complications at the cost of increased risk of hypoglycemia.    Hgb A1c MFr Bld  Date Value Ref Range Status  03/27/2023 8.2 (H) 4.8 - 5.6 % Final    Comment:             Prediabetes: 5.7 - 6.4          Diabetes: >6.4          Glycemic control for adults with diabetes: <7.0          Failed - B12 Level in normal range and within 720 days    No results found for: "VITAMINB12"       Passed - Cr in normal range and within 360 days    Creatinine  Date Value Ref Range Status  07/08/2013 0.92 0.60 - 1.30  mg/dL Final   Creatinine, Ser  Date Value Ref Range Status  08/01/2022 0.89 0.57 - 1.00 mg/dL Final         Passed - eGFR in normal range and within 360 days    EGFR (African American)  Date Value Ref Range Status  07/08/2013 >60  Final   GFR calc Af Amer  Date Value Ref Range Status  07/21/2019 89 >59 mL/min/1.73 Final    Comment:    **[facility] currently reports eGFR in compliance with the current**   recommendations of the [employer]. [facility] will  update reporting as new guidelines are published from the NKF-ASN   Task force.    EGFR (Non-African Amer.)  Date Value Ref Range Status  07/08/2013 >60  Final    Comment:    eGFR values <88mL/min/1.73 m2 may be an indication of chronic kidney disease (CKD). Calculated eGFR is useful in patients with stable renal function. The eGFR calculation will not be reliable in acutely ill patients when serum creatinine is changing rapidly. It is not useful in  patients on dialysis. The eGFR calculation may not be applicable to patients at the low and high extremes of body sizes, pregnant women, and vegetarians.    GFR, Estimated  Date Value Ref Range Status  05/06/2021 51 (L) >60 mL/min Final    Comment:    (NOTE) Calculated using the CKD-EPI Creatinine Equation (2021)    eGFR  Date Value Ref Range Status  08/01/2022 72 >59 mL/min/1.73 Final         Passed - Valid encounter within last 6 months    Recent Outpatient Visits           2 days ago Bronchitis   [place] [facility] at [facility], Chales [place], MD   2 months ago Essential hypertension   [place] [facility] at [facility], Chales [place], MD       Future Appointments             In 2 months Gala Jubilee [place], MD [facility] at [facility], [facility]            Passed - CBC within normal limits and completed in the last 12 months    WBC  Date  Value Ref Range Status  08/01/2022 7.8 3.4 - 10.8 x10E3/uL Final  05/06/2021 9.6 4.0 - 10.5 K/uL Final   RBC  Date Value Ref Range Status  08/01/2022 4.71 3.77 - 5.28 x10E6/uL Final  05/06/2021 4.97 3.87 - 5.11 MIL/uL Final   Hemoglobin  Date Value Ref Range Status  08/01/2022 14.0 11.1 - 15.9 g/dL Final   Hematocrit  Date Value Ref Range Status  08/01/2022 41.4 34.0 - 46.6 % Final   MCHC  Date Value Ref Range Status  08/01/2022 33.8 31.5 - 35.7 g/dL Final  95/28/4132 44.0 30.0 - 36.0 g/dL Final   [facility]  Date Value Ref Range Status  08/01/2022 29.7 26.6 - 33.0 pg Final  05/06/2021 29.0 26.0 - 34.0 pg Final   MCV  Date Value Ref Range Status  08/01/2022 88 79 - 97 fL Final  09/28/2013 85 80 - 100 fL Final   No results found for: "PLTCOUNTKUC", "LABPLAT", "POCPLA" RDW  Date Value Ref Range Status  08/01/2022 13.0 11.7 - 15.4 % Final  09/28/2013 12.9 11.5 - 14.5 % Final         Refused Prescriptions Disp Refills   DULoxetine  (CYMBALTA ) 60 MG capsule [Pharmacy Med Name: DULOXETINE  HCL DR 60 MG CAP] 90 capsule     Sig: TAKE 1 CAPSULE BY MOUTH EVERY DAY     Psychiatry: Antidepressants - SNRI - duloxetine  Passed - 06/05/2023  1:13 PM      Passed - Cr in normal range and within 360 days    Creatinine  Date Value Ref Range Status  07/08/2013 0.92 0.60 - 1.30 mg/dL Final   Creatinine, Ser  Date Value Ref Range Status  08/01/2022 0.89 0.57 - 1.00 mg/dL Final  Passed - eGFR is 30 or above and within 360 days    EGFR (African American)  Date Value Ref Range Status  07/08/2013 >60  Final   GFR calc Af Amer  Date Value Ref Range Status  07/21/2019 89 >59 mL/min/1.73 Final    Comment:    **[facility] currently reports eGFR in compliance with the current**   recommendations of the [employer]. [facility] will   update reporting as new guidelines are published from the NKF-ASN   Task force.    EGFR (Non-African Amer.)  Date Value Ref Range Status   07/08/2013 >60  Final    Comment:    eGFR values <44mL/min/1.73 m2 may be an indication of chronic kidney disease (CKD). Calculated eGFR is useful in patients with stable renal function. The eGFR calculation will not be reliable in acutely ill patients when serum creatinine is changing rapidly. It is not useful in  patients on dialysis. The eGFR calculation may not be applicable to patients at the low and high extremes of body sizes, pregnant women, and vegetarians.    GFR, Estimated  Date Value Ref Range Status  05/06/2021 51 (L) >60 mL/min Final    Comment:    (NOTE) Calculated using the CKD-EPI Creatinine Equation (2021)    eGFR  Date Value Ref Range Status  08/01/2022 72 >59 mL/min/1.73 Final         Passed - Completed PHQ-2 or PHQ-9 in the last 360 days      Passed - Last BP in normal range    BP Readings from Last 1 Encounters:  06/03/23 120/70         Passed - Valid encounter within last 6 months    Recent Outpatient Visits           2 days ago Bronchitis   [place] [facility] at [facility], Chales [place], MD   2 months ago Essential hypertension   [place] [facility] at [facility], Chales [place], MD       Future Appointments             In 2 months Sheron Dixons, MD [facility] Primary Care & Sports Medicine at [facility], [facility]             metoprolol  succinate (TOPROL -XL) 100 MG 24 hr tablet [Pharmacy Med Name: METOPROLOL  SUCC ER 100 MG TAB] 90 tablet 0    Sig: TAKE 0.5 TABLETS (50 MG TOTAL) BY MOUTH IN THE MORNING AND AT BEDTIME.     Cardiovascular:  Beta Blockers Passed - 06/05/2023  1:13 PM      Passed - Last BP in normal range    BP Readings from Last 1 Encounters:  06/03/23 120/70         Passed - Last Heart Rate in normal range    Pulse Readings from Last 1 Encounters:  06/03/23 74         Passed - Valid encounter within last 6 months    Recent Outpatient Visits            2 days ago Bronchitis   [facility] Medicine at [facility], Chales [place], MD   2 months ago Essential hypertension   [place] [facility] at [facility], Chales [place], MD       Future Appointments  In 2 months Gala Jubilee [place], MD [facility] Primary Care & Sports Medicine at [facility], [facility]               Requested Prescriptions  Pending Prescriptions Disp Refills   methocarbamol  (ROBAXIN ) 500 MG tablet [Pharmacy Med Name: METHOCARBAMOL  500 MG TABLET] 30 tablet 0    Sig: TAKE 1 TABLET BY MOUTH AT BEDTIME.     Not Delegated - Analgesics:  Muscle Relaxants Failed - 06/05/2023  1:13 PM      Failed - This refill cannot be delegated      Passed - Valid encounter within last 6 months    Recent Outpatient Visits           2 days ago Bronchitis   [place] [facility] at [facility], Chales [place], MD   2 months ago Essential hypertension   [place] [facility] at [facility], Chales [place], MD       Future Appointments             In 2 months Sheron Dixons, MD [facility] Primary Care & Sports Medicine at [facility]            Signed Prescriptions Disp Refills   Dulaglutide  (TRULICITY ) 1.5 MG/0.5ML SOAJ 3 mL 1    Sig: INJECT 1.5 MG SUBCUTANEOUSLY ONCE A WEEK     Endocrinology:  Diabetes - GLP-1 Receptor Agonists Failed - 06/05/2023  1:13 PM      Failed - HBA1C is between 0 and 7.9 and within 180 days    HB A1C (BAYER DCA - WAIVED)  Date Value Ref Range Status  07/28/2014 6.9 <7.0 % Final    Comment:                                          Diabetic Adult            <7.0                                       Healthy Adult        4.3 - 5.7                                                           (DCCT/NGSP) American Diabetes Association's Summary of Glycemic Recommendations for Adults with  Diabetes: Hemoglobin A1c <7.0%. More stringent glycemic goals (A1c <6.0%) may further reduce complications at the cost of increased risk of hypoglycemia.    Hgb A1c MFr Bld  Date Value Ref Range Status  03/27/2023 8.2 (H) 4.8 - 5.6 % Final    Comment:             Prediabetes: 5.7 - 6.4          Diabetes: >6.4          Glycemic control for adults with diabetes: <7.0          Passed - Valid encounter within last 6 months    Recent Outpatient Visits  2 days ago Bronchitis   [place] [facility] at [facility], Chales [place], MD   2 months ago Essential hypertension   [place] [facility] at [facility], Chales [place], MD       Future Appointments             In 2 months Sheron Dixons, MD [facility] Primary Care & Sports Medicine at [facility], [facility]             metFORMIN  (GLUCOPHAGE -XR) 500 MG 24 hr tablet 90 tablet 0    Sig: TAKE 1 TABLET BY MOUTH EVERY DAY WITH BREAKFAST     Endocrinology:  Diabetes - Biguanides Failed - 06/05/2023  1:13 PM      Failed - HBA1C is between 0 and 7.9 and within 180 days    HB A1C (BAYER DCA - WAIVED)  Date Value Ref Range Status  07/28/2014 6.9 <7.0 % Final    Comment:                                          Diabetic Adult            <7.0                                       Healthy Adult        4.3 - 5.7                                                           (DCCT/NGSP) American Diabetes Association's Summary of Glycemic Recommendations for Adults with Diabetes: Hemoglobin A1c <7.0%. More stringent glycemic goals (A1c <6.0%) may further reduce complications at the cost of increased risk of hypoglycemia.    Hgb A1c MFr Bld  Date Value Ref Range Status  03/27/2023 8.2 (H) 4.8 - 5.6 % Final    Comment:             Prediabetes: 5.7 - 6.4          Diabetes: >6.4          Glycemic control for adults with diabetes: <7.0          Failed - B12 Level  in normal range and within 720 days    No results found for: "VITAMINB12"       Passed - Cr in normal range and within 360 days    Creatinine  Date Value Ref Range Status  07/08/2013 0.92 0.60 - 1.30 mg/dL Final   Creatinine, Ser  Date Value Ref Range Status  08/01/2022 0.89 0.57 - 1.00 mg/dL Final         Passed - eGFR in normal range and within 360 days    EGFR (African American)  Date Value Ref Range Status  07/08/2013 >60  Final   GFR calc Af Amer  Date Value Ref Range Status  07/21/2019 89 >59 mL/min/1.73 Final    Comment:    **[facility] currently reports eGFR in compliance with the current**   recommendations of the [employer]. [facility] will  update reporting as new guidelines are published from the NKF-ASN   Task force.    EGFR (Non-African Amer.)  Date Value Ref Range Status  07/08/2013 >60  Final    Comment:    eGFR values <83mL/min/1.73 m2 may be an indication of chronic kidney disease (CKD). Calculated eGFR is useful in patients with stable renal function. The eGFR calculation will not be reliable in acutely ill patients when serum creatinine is changing rapidly. It is not useful in  patients on dialysis. The eGFR calculation may not be applicable to patients at the low and high extremes of body sizes, pregnant women, and vegetarians.    GFR, Estimated  Date Value Ref Range Status  05/06/2021 51 (L) >60 mL/min Final    Comment:    (NOTE) Calculated using the CKD-EPI Creatinine Equation (2021)    eGFR  Date Value Ref Range Status  08/01/2022 72 >59 mL/min/1.73 Final         Passed - Valid encounter within last 6 months    Recent Outpatient Visits           2 days ago Bronchitis   [place] [facility] at [facility], Chales [place], MD   2 months ago Essential hypertension   [place] [facility] at [facility], [place]ales [place], MD       Future Appointments              In 2 months Gala Jubilee Ch[place], MD [facility] Primary Care & Sports Medicine at [facility], [facility]            Passed - CBC within normal limits and completed in the last 12 months    WBC  Date Value Ref Range Status  08/01/2022 7.8 3.4 - 10.8 x10E3/uL Final  05/06/2021 9.6 4.0 - 10.5 K/uL Final   RBC  Date Value Ref Range Status  08/01/2022 4.71 3.77 - 5.28 x10E6/uL Final  05/06/2021 4.97 3.87 - 5.11 MIL/uL Final   Hemoglobin  Date Value Ref Range Status  08/01/2022 14.0 11.1 - 15.9 g/dL Final   Hematocrit  Date Value Ref Range Status  08/01/2022 41.4 34.0 - 46.6 % Final   MCHC  Date Value Ref Range Status  08/01/2022 33.8 31.5 - 35.7 g/dL Final  69/62/9528 41.3 30.0 - 36.0 g/dL Final   [facility]  Date Value Ref Range Status  08/01/2022 29.7 26.6 - 33.0 pg Final  05/06/2021 29.0 26.0 - 34.0 pg Final   MCV  Date Value Ref Range Status  08/01/2022 88 79 - 97 fL Final  09/28/2013 85 80 - 100 fL Final   No results found for: "PLTCOUNTKUC", "LABPLAT", "POCPLA" RDW  Date Value Ref Range Status  08/01/2022 13.0 11.7 - 15.4 % Final  09/28/2013 12.9 11.5 - 14.5 % Final         Refused Prescriptions Disp Refills   DULoxetine  (CYMBALTA ) 60 MG capsule [Pharmacy Med Name: DULOXETINE  HCL DR 60 MG CAP] 90 capsule     Sig: TAKE 1 CAPSULE BY MOUTH EVERY DAY     Psychiatry: Antidepressants - SNRI - duloxetine  Passed - 06/05/2023  1:13 PM      Passed - Cr in normal range and within 360 days    Creatinine  Date Value Ref Range Status  07/08/2013 0.92 0.60 - 1.30 mg/dL Final   Creatinine, Ser  Date Value Ref Range Status  08/01/2022 0.89 0.57 - 1.00 mg/dL Final  Passed - eGFR is 30 or above and within 360 days    EGFR (African American)  Date Value Ref Range Status  07/08/2013 >60  Final   GFR calc Af Amer  Date Value Ref Range Status  07/21/2019 89 >59 mL/min/1.73 Final    Comment:    **[facility] currently reports eGFR in compliance with the current**    recommendations of the [employer]. [facility] will   update reporting as new guidelines are published from the NKF-ASN   Task force.    EGFR (Non-African Amer.)  Date Value Ref Range Status  07/08/2013 >60  Final    Comment:    eGFR values <36mL/min/1.73 m2 may be an indication of chronic kidney disease (CKD). Calculated eGFR is useful in patients with stable renal function. The eGFR calculation will not be reliable in acutely ill patients when serum creatinine is changing rapidly. It is not useful in  patients on dialysis. The eGFR calculation may not be applicable to patients at the low and high extremes of body sizes, pregnant women, and vegetarians.    GFR, Estimated  Date Value Ref Range Status  05/06/2021 51 (L) >60 mL/min Final    Comment:    (NOTE) Calculated using the CKD-EPI Creatinine Equation (2021)    eGFR  Date Value Ref Range Status  08/01/2022 72 >59 mL/min/1.73 Final         Passed - Completed PHQ-2 or PHQ-9 in the last 360 days      Passed - Last BP in normal range    BP Readings from Last 1 Encounters:  06/03/23 120/70         Passed - Valid encounter within last 6 months    Recent Outpatient Visits           2 days ago Bronchitis   [place] [facility] at [facility], Chales [place], MD   2 months ago Essential hypertension   [place] [facility] Medicine at [facility], Chales [place], MD       Future Appointments             In 2 months Sheron Dixons, MD [facility] Primary Care & Sports Medicine at [facility], [facility]             metoprolol  succinate (TOPROL -XL) 100 MG 24 hr tablet [Pharmacy Med Name: METOPROLOL  SUCC ER 100 MG TAB] 90 tablet 0    Sig: TAKE 0.5 TABLETS (50 MG TOTAL) BY MOUTH IN THE MORNING AND AT BEDTIME.     Cardiovascular:  Beta Blockers Passed - 06/05/2023  1:13 PM      Passed - Last BP in normal range    BP Readings from Last 1 Encounters:   06/03/23 120/70         Passed - Last Heart Rate in normal range    Pulse Readings from Last 1 Encounters:  06/03/23 74         Passed - Valid encounter within last 6 months    Recent Outpatient Visits           2 days ago Bronchitis   [facility] Primary Care & Sports Medicine at [facility], Chales [place], MD   2 months ago Essential hypertension   [place] [facility] Medicine at [facility], Chales [place], MD       Future Appointments  In 2 months Gala Jubilee, [place], MD [facility] Primary Care & Sports Medicine at [facility], [facility]

## 2023-06-05 NOTE — Telephone Encounter (Signed)
 Unable to fill: Duloxetine -05/10/23 #180- too soon Metoprolol - 05/20/23 #90- too soon Requested Prescriptions  Pending Prescriptions Disp Refills   TRULICITY  1.5 MG/0.5ML SOAJ [Pharmacy Med Name: TRULICITY  1.5 MG/0.5 ML PEN]  1    Sig: INJECT 1.5 MG SUBCUTANEOUSLY ONCE A WEEK     Endocrinology:  Diabetes - GLP-1 Receptor Agonists Failed - 06/05/2023  1:11 PM      Failed - HBA1C is between 0 and 7.9 and within 180 days    HB A1C (BAYER DCA - WAIVED)  Date Value Ref Range Status  07/28/2014 6.9 <7.0 % Final    Comment:                                          Diabetic Adult            <7.0                                       Healthy Adult        4.3 - 5.7                                                           (DCCT/NGSP) American Diabetes Association's Summary of Glycemic Recommendations for Adults with Diabetes: Hemoglobin A1c <7.0%. More stringent glycemic goals (A1c <6.0%) may further reduce complications at the cost of increased risk of hypoglycemia.    Hgb A1c MFr Bld  Date Value Ref Range Status  03/27/2023 8.2 (H) 4.8 - 5.6 % Final    Comment:             Prediabetes: 5.7 - 6.4          Diabetes: >6.4          Glycemic control for adults with diabetes: <7.0          Passed - Valid encounter within last 6 months    Recent Outpatient Visits           2 days ago Bronchitis   [place] [facility] Medicine at [facility], [place], MD   2 months ago Essential hypertension    Primary Care & Sports Medicine at [facility], [place], MD       Future Appointments             In 2 months Tracy Dixons, MD [facility] Primary Care & Sports Medicine at [facility], [facility]             metFORMIN  (GLUCOPHAGE -XR) 500 MG 24 hr tablet [Pharmacy Med Name: METFORMIN  HCL ER 500 MG TABLET] 90 tablet 0    Sig: TAKE 1 TABLET BY MOUTH EVERY DAY WITH BREAKFAST     Endocrinology:  Diabetes - Biguanides Failed - 06/05/2023   1:11 PM      Failed - HBA1C is between 0 and 7.9 and within 180 days    HB A1C (BAYER DCA - WAIVED)  Date Value Ref Range Status  07/28/2014 6.9 <7.0 % Final    Comment:  Diabetic Adult            <7.0                                       Healthy Adult        4.3 - 5.7                                                           (DCCT/NGSP) American Diabetes Association's Summary of Glycemic Recommendations for Adults with Diabetes: Hemoglobin A1c <7.0%. More stringent glycemic goals (A1c <6.0%) may further reduce complications at the cost of increased risk of hypoglycemia.    Hgb A1c MFr Bld  Date Value Ref Range Status  03/27/2023 8.2 (H) 4.8 - 5.6 % Final    Comment:             Prediabetes: 5.7 - 6.4          Diabetes: >6.4          Glycemic control for adults with diabetes: <7.0          Failed - B12 Level in normal range and within 720 days    No results found for: "VITAMINB12"       Passed - Cr in normal range and within 360 days    Creatinine  Date Value Ref Range Status  07/08/2013 0.92 0.60 - 1.30 mg/dL Final   Creatinine, Ser  Date Value Ref Range Status  08/01/2022 0.89 0.57 - 1.00 mg/dL Final         Passed - eGFR in normal range and within 360 days    EGFR (African American)  Date Value Ref Range Status  07/08/2013 >60  Final   GFR calc Af Amer  Date Value Ref Range Status  07/21/2019 89 >59 mL/min/1.73 Final    Comment:    **[facility] currently reports eGFR in compliance with the current**   recommendations of the [employer]. [facility] will   update reporting as new guidelines are published from the NKF-ASN   Task force.    EGFR (Non-African Amer.)  Date Value Ref Range Status  07/08/2013 >60  Final    Comment:    eGFR values <75mL/min/1.73 m2 may be an indication of chronic kidney disease (CKD). Calculated eGFR is useful in patients with stable renal function. The eGFR calculation will not  be reliable in acutely ill patients when serum creatinine is changing rapidly. It is not useful in  patients on dialysis. The eGFR calculation may not be applicable to patients at the low and high extremes of body sizes, pregnant women, and vegetarians.    GFR, Estimated  Date Value Ref Range Status  05/06/2021 51 (L) >60 mL/min Final    Comment:    (NOTE) Calculated using the CKD-EPI Creatinine Equation (2021)    eGFR  Date Value Ref Range Status  08/01/2022 72 >59 mL/min/1.73 Final         Passed - Valid encounter within last 6 months    Recent Outpatient Visits           2 days ago Bronchitis   [facility] Primary Care & Sports Medicine at [facility], [place], MD   2 months  ago Essential hypertension   [place] Primary Care & Sports Medicine at [facility], [place], MD       Future Appointments             In 2 months Tracy Bryan [place], MD [facility] Primary Care & Sports Medicine at [facility]            Passed - CBC within normal limits and completed in the last 12 months    WBC  Date Value Ref Range Status  08/01/2022 7.8 3.4 - 10.8 x10E3/uL Final  05/06/2021 9.6 4.0 - 10.5 K/uL Final   RBC  Date Value Ref Range Status  08/01/2022 4.71 3.77 - 5.28 x10E6/uL Final  05/06/2021 4.97 3.87 - 5.11 MIL/uL Final   Hemoglobin  Date Value Ref Range Status  08/01/2022 14.0 11.1 - 15.9 g/dL Final   Hematocrit  Date Value Ref Range Status  08/01/2022 41.4 34.0 - 46.6 % Final   MCHC  Date Value Ref Range Status  08/01/2022 33.8 31.5 - 35.7 g/dL Final  16/11/9602 54.0 30.0 - 36.0 g/dL Final   [facility]  Date Value Ref Range Status  08/01/2022 29.7 26.6 - 33.0 pg Final  05/06/2021 29.0 26.0 - 34.0 pg Final   MCV  Date Value Ref Range Status  08/01/2022 88 79 - 97 fL Final  09/28/2013 85 80 - 100 fL Final   No results found for: "PLTCOUNTKUC", "LABPLAT", "POCPLA" RDW  Date Value Ref Range Status  08/01/2022 13.0  11.7 - 15.4 % Final  09/28/2013 12.9 11.5 - 14.5 % Final          methocarbamol  (ROBAXIN ) 500 MG tablet [Pharmacy Med Name: METHOCARBAMOL  500 MG TABLET] 30 tablet 0    Sig: TAKE 1 TABLET BY MOUTH AT BEDTIME.     Not Delegated - Analgesics:  Muscle Relaxants Failed - 06/05/2023  1:11 PM      Failed - This refill cannot be delegated      Passed - Valid encounter within last 6 months    Recent Outpatient Visits           2 days ago Bronchitis   [place] Primary Care & Sports Medicine at [facility], [place], MD   2 months ago Essential hypertension   [place] Primary Care & Sports Medicine at [facility], [place], MD       Future Appointments             In 2 months Tracy Bryan [place], MD [facility] Primary Care & Sports Medicine at [facility], [facility]             DULoxetine  (CYMBALTA ) 60 MG capsule [Pharmacy Med Name: DULOXETINE  HCL DR 60 MG CAP] 90 capsule     Sig: TAKE 1 CAPSULE BY MOUTH EVERY DAY     Psychiatry: Antidepressants - SNRI - duloxetine  Passed - 06/05/2023  1:11 PM      Passed - Cr in normal range and within 360 days    Creatinine  Date Value Ref Range Status  07/08/2013 0.92 0.60 - 1.30 mg/dL Final   Creatinine, Ser  Date Value Ref Range Status  08/01/2022 0.89 0.57 - 1.00 mg/dL Final         Passed - eGFR is 30 or above and within 360 days    EGFR (African American)  Date Value Ref Range Status  07/08/2013 >60  Final   GFR calc Af Zara Heymann  Date Value  Ref Range Status  07/21/2019 89 >59 mL/min/1.73 Final    Comment:    **[facility] currently reports eGFR in compliance with the current**   recommendations of the [employer]. [facility] will   update reporting as new guidelines are published from the NKF-ASN   Task force.    EGFR (Non-African Amer.)  Date Value Ref Range Status  07/08/2013 >60  Final    Comment:    eGFR values <71mL/min/1.73 m2 may be an indication of chronic kidney disease  (CKD). Calculated eGFR is useful in patients with stable renal function. The eGFR calculation will not be reliable in acutely ill patients when serum creatinine is changing rapidly. It is not useful in  patients on dialysis. The eGFR calculation may not be applicable to patients at the low and high extremes of body sizes, pregnant women, and vegetarians.    GFR, Estimated  Date Value Ref Range Status  05/06/2021 51 (L) >60 mL/min Final    Comment:    (NOTE) Calculated using the CKD-EPI Creatinine Equation (2021)    eGFR  Date Value Ref Range Status  08/01/2022 72 >59 mL/min/1.73 Final         Passed - Completed PHQ-2 or PHQ-9 in the last 360 days      Passed - Last BP in normal range    BP Readings from Last 1 Encounters:  06/03/23 120/70         Passed - Valid encounter within last 6 months    Recent Outpatient Visits           2 days ago Bronchitis   [place] Primary Care & Sports Medicine at [facility], [place], MD   2 months ago Essential hypertension   [place] Primary Care & Sports Medicine at [facility], [place], MD       Future Appointments             In 2 months Tracy Dixons, MD [facility] Primary Care & Sports Medicine at [facility], [facility]             metoprolol  succinate (TOPROL -XL) 100 MG 24 hr tablet [Pharmacy Med Name: METOPROLOL  SUCC ER 100 MG TAB] 90 tablet 0    Sig: TAKE 0.5 TABLETS (50 MG TOTAL) BY MOUTH IN THE MORNING AND AT BEDTIME.     Cardiovascular:  Beta Blockers Passed - 06/05/2023  1:11 PM      Passed - Last BP in normal range    BP Readings from Last 1 Encounters:  06/03/23 120/70         Passed - Last Heart Rate in normal range    Pulse Readings from Last 1 Encounters:  06/03/23 74         Passed - Valid encounter within last 6 months    Recent Outpatient Visits           2 days ago Bronchitis   [place] [facility] Care & Sports Medicine at [facility], [place], MD   2 months ago Essential hypertension   [place] Primary Care & Sports Medicine at [facility], [place], MD       Future Appointments             In 2 months Tracy Bryan, [place], MD [facility] Primary Care & Sports Medicine at [facility], [facility]

## 2023-06-11 LAB — HM DIABETES EYE EXAM

## 2023-06-18 IMAGING — CT CT ABD-PELV W/ CM
2 of 5 series · 15 of 46 positions shown, 17 images · IV contrast (APPLIED)
Comparison: 04/03/2006

CLINICAL DATA: Diarrhea

EXAM:
CT ABDOMEN AND PELVIS WITH CONTRAST
TECHNIQUE: Multidetector CT imaging of the abdomen and pelvis was performed
using the standard protocol following bolus administration of
intravenous contrast.

[Series 2: abdomen 5.0 · axial · 0.66mm/px · z∈[-955,-570]mm · 12 of 89 slices shown, 14 images]
[im 6/89  soft-tissue]
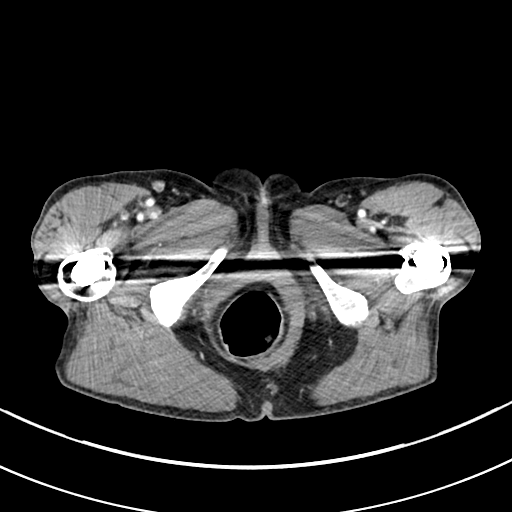
[im 6/89  bone]
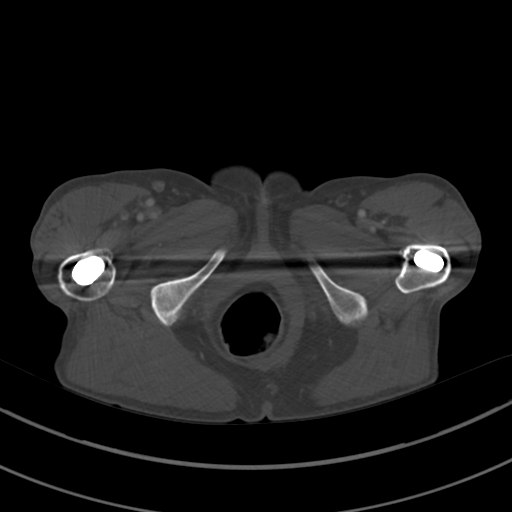
[im 24/89  soft-tissue]
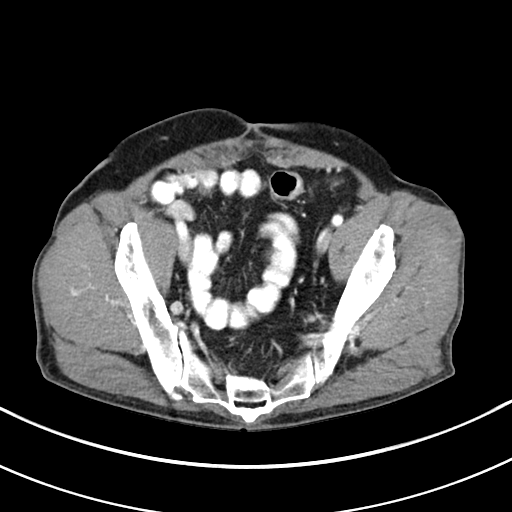
[im 30/89  soft-tissue]
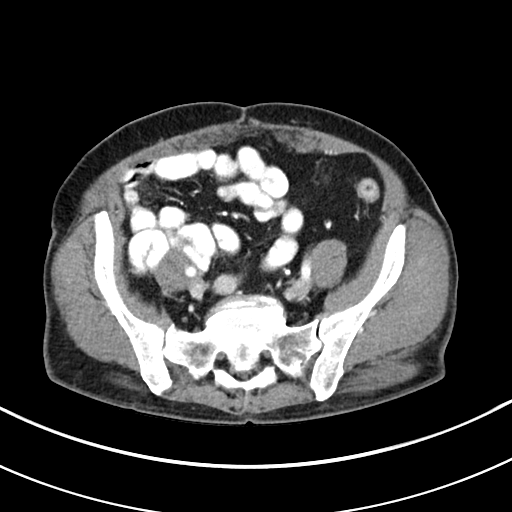
[im 36/89  soft-tissue]
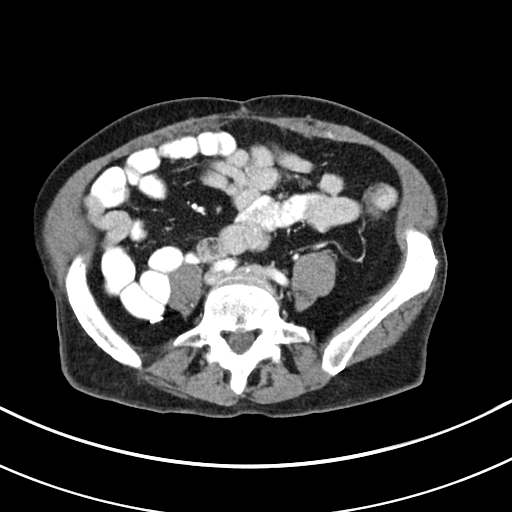
[im 42/89  soft-tissue]
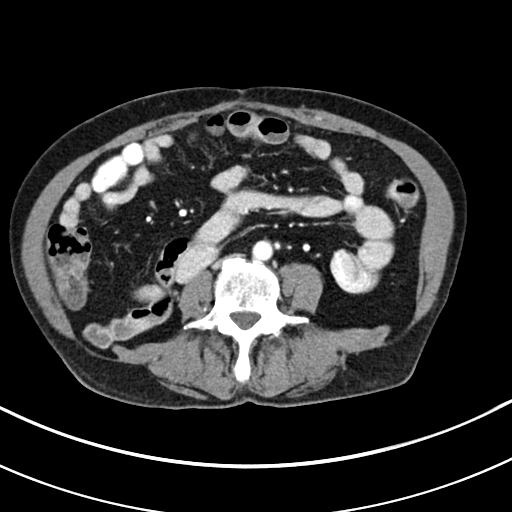
[im 47/89  soft-tissue]
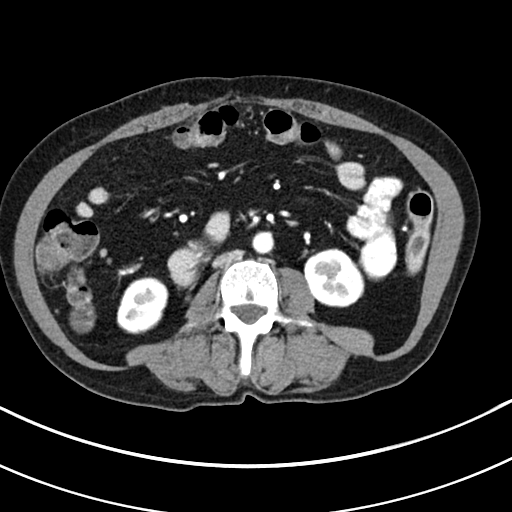
[im 53/89  soft-tissue]
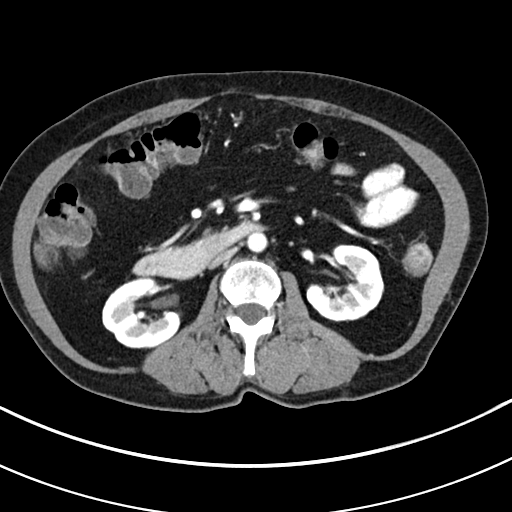
[im 59/89  soft-tissue]
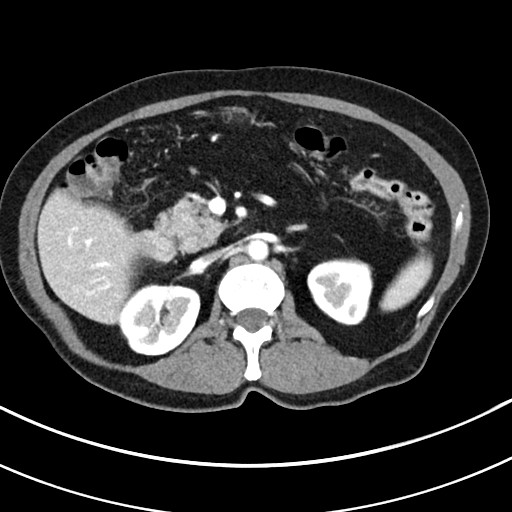
[im 65/89  soft-tissue]
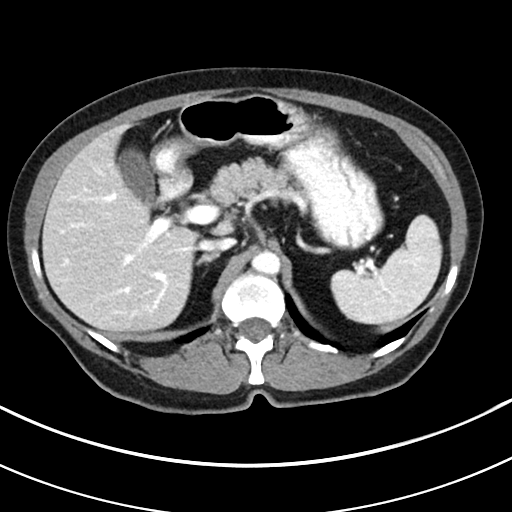
[im 65/89  bone]
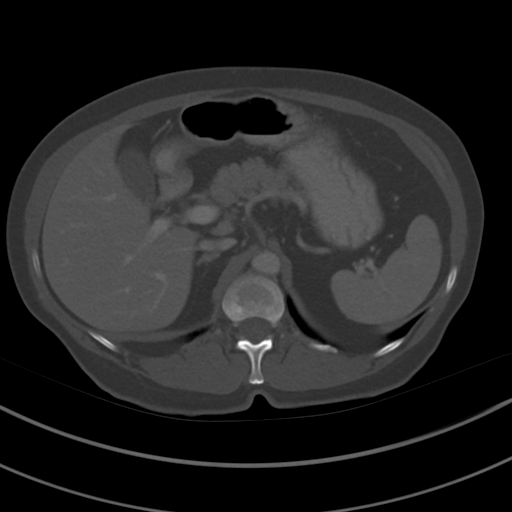
[im 71/89  soft-tissue]
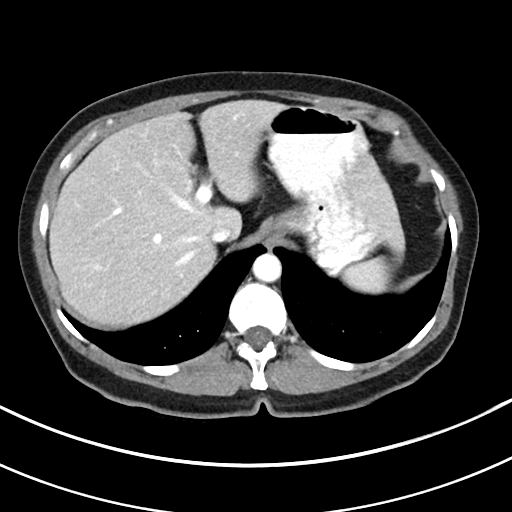
[im 77/89  soft-tissue]
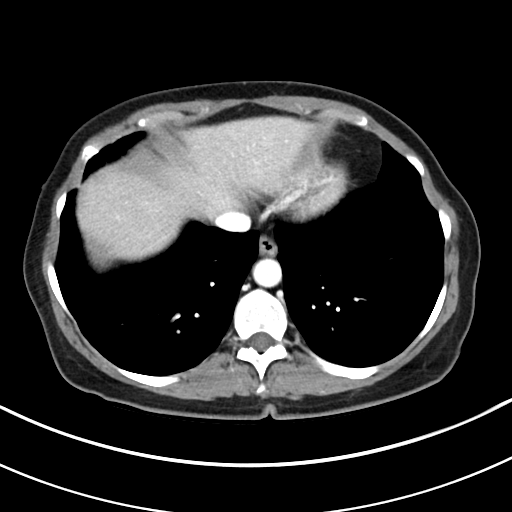
[im 83/89  soft-tissue]
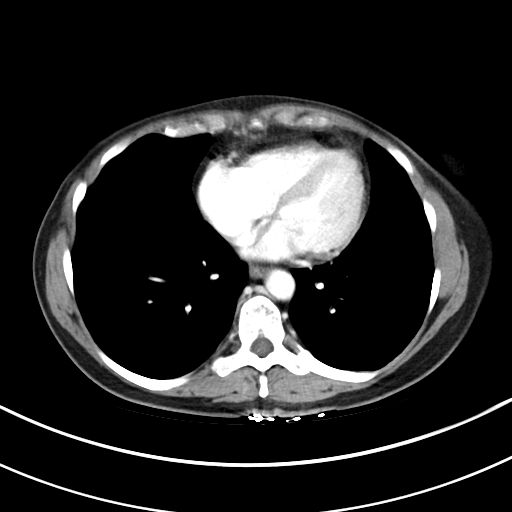

[Series 5: abdomen 3.0 mpr cor · coronal · 0.66mm/px · 3 of 82 slices shown]
[im 28/82  soft-tissue]
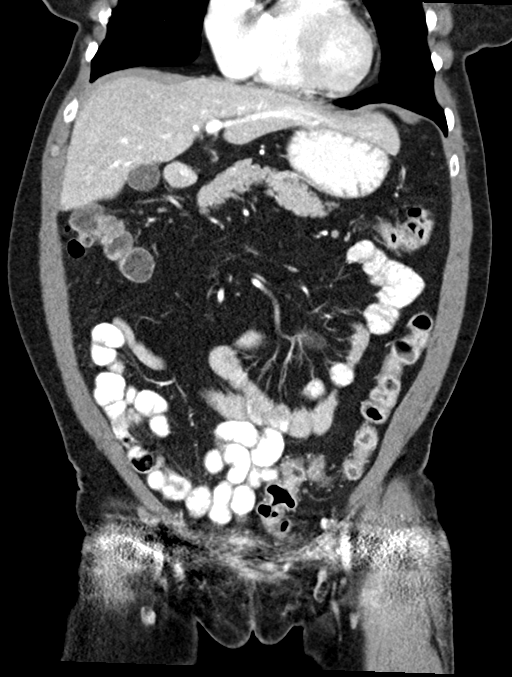
[im 37/82  soft-tissue]
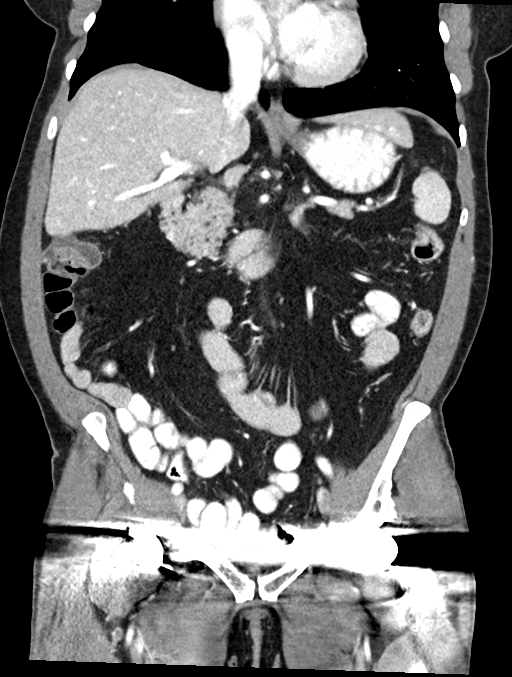
[im 46/82  soft-tissue]
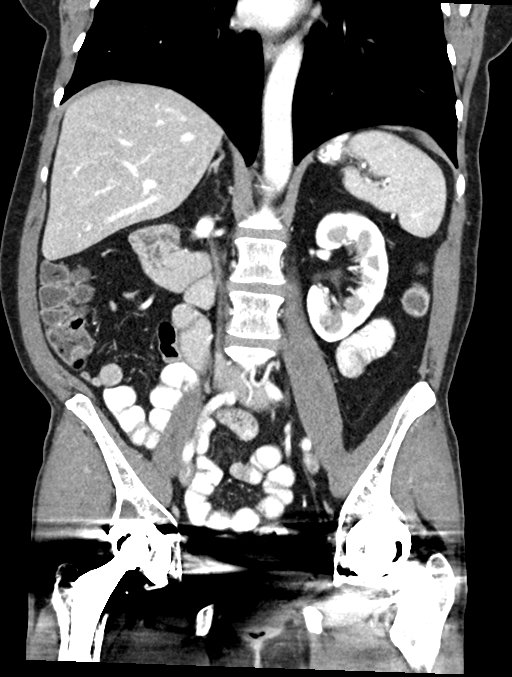

[15 of 46 positions shown; findings below may reference images not displayed]

RADIATION DOSE REDUCTION: This exam was performed according to the
departmental dose-optimization program which includes automated
exposure control, adjustment of the mA and/or kV according to
patient size and/or use of iterative reconstruction technique.

CONTRAST:  80mL OMNIPAQUE IOHEXOL 300 MG/ML SOLN additional oral
enteric contrast
FINDINGS: Lower chest: No acute abnormality.

Hepatobiliary: No solid liver abnormality is seen. Hepatic
steatosis. No gallstones, gallbladder wall thickening, or biliary
dilatation.

Pancreas: Unremarkable. No pancreatic ductal dilatation or
surrounding inflammatory changes.

Spleen: Normal in size without significant abnormality.

Adrenals/Urinary Tract: Adrenal glands are unremarkable. Kidneys are
normal, without renal calculi, solid lesion, or hydronephrosis.
Assessment of the bladder is very limited by dense metallic streak
artifact from adjacent bilateral hip arthroplasty.

Stomach/Bowel: Stomach is within normal limits. Appendix appears
normal. No evidence of bowel wall thickening, distention, or
inflammatory changes.

Vascular/Lymphatic: No significant vascular findings are present. No
enlarged abdominal or pelvic lymph nodes.

Reproductive: Status post hysterectomy.

Other: No abdominal wall hernia or abnormality. No ascites. Pelvic
floor insufficiency with rectocele (series 6, image 58).

Musculoskeletal: No acute or significant osseous findings. Status
post bilateral hip total arthroplasty.
IMPRESSION: 1. No acute CT findings of the abdomen or pelvis to explain
diarrhea.
2. Hepatic steatosis.

## 2023-06-29 ENCOUNTER — Other Ambulatory Visit: Payer: Self-pay | Admitting: Internal Medicine

## 2023-06-29 DIAGNOSIS — E1169 Type 2 diabetes mellitus with other specified complication: Secondary | ICD-10-CM

## 2023-06-30 ENCOUNTER — Other Ambulatory Visit: Payer: Self-pay | Admitting: Internal Medicine

## 2023-06-30 DIAGNOSIS — J40 Bronchitis, not specified as acute or chronic: Secondary | ICD-10-CM

## 2023-07-02 NOTE — Telephone Encounter (Signed)
 Requested Prescriptions  Pending Prescriptions Disp Refills   albuterol  (VENTOLIN  HFA) 108 (90 Base) MCG/ACT inhaler [Pharmacy Med Name: ALBUTEROL  HFA (VENTOLIN ) INH] 18 g 0    Sig: TAKE 2 PUFFS BY MOUTH EVERY 6 HOURS AS NEEDED FOR WHEEZE OR SHORTNESS OF BREATH     Pulmonology:  Beta Agonists 2 Passed - 07/02/2023  2:51 PM      Passed - Last BP in normal range    BP Readings from Last 1 Encounters:  06/03/23 120/70         Passed - Last Heart Rate in normal range    Pulse Readings from Last 1 Encounters:  06/03/23 74         Passed - Valid encounter within last 12 months    Recent Outpatient Visits           4 weeks ago Bronchitis   [place] Primary Care & Sports Medicine at [facility], [place], MD   3 months ago Essential hypertension   [place] Primary Care & Sports Medicine at [facility], [place], MD       Future Appointments             In 1 month Gala Jubilee, [place], MD [facility] Primary Care & Sports Medicine at [facility], [facility]

## 2023-07-02 NOTE — Telephone Encounter (Signed)
Please review medication refill  request

## 2023-07-02 NOTE — Telephone Encounter (Signed)
 Requested medication (s) are due for refill today - expired Rx  Requested medication (s) are on the active medication list -yes  Future visit scheduled -yes  Last refill: 06/11/22 #360 1RF  Notes to clinic: off protocol- provider review   Requested Prescriptions  Pending Prescriptions Disp Refills   Icosapent  Ethyl 0.5 g CAPS [Pharmacy Med Name: ICOSAPENT  ETHYL 500 MG CAPSULE] 360 capsule 1    Sig: TAKE 2 CAPSULES BY MOUTH TWICE A DAY     Off-Protocol Failed - 07/02/2023 12:36 PM      Failed - Medication not assigned to a protocol, review manually.      Passed - Valid encounter within last 12 months    Recent Outpatient Visits           4 weeks ago Bronchitis   [place] Primary Care & Sports Medicine at [facility], [place], MD   3 months ago Essential hypertension   [place] Primary Care & Sports Medicine at [facility], [place], MD       Future Appointments             In 1 month Gala Jubilee [place], MD [facility] Primary Care & Sports Medicine at [facility], [facility]               Requested Prescriptions  Pending Prescriptions Disp Refills   Icosapent  Ethyl 0.5 g CAPS [Pharmacy Med Name: ICOSAPENT  ETHYL 500 MG CAPSULE] 360 capsule 1    Sig: TAKE 2 CAPSULES BY MOUTH TWICE A DAY     Off-Protocol Failed - 07/02/2023 12:36 PM      Failed - Medication not assigned to a protocol, review manually.      Passed - Valid encounter within last 12 months    Recent Outpatient Visits           4 weeks ago Bronchitis   [place] [facility] Care & [facility] Medicine at [facility], [place], MD   3 months ago Essential hypertension   [facility] Primary Care & Sports Medicine at [facility], [place], MD       Future Appointments             In 1 month Gala Jubilee, [place], MD [facility] Primary Care & Sports Medicine at [facility], [facility]

## 2023-07-27 ENCOUNTER — Other Ambulatory Visit: Payer: Self-pay | Admitting: Internal Medicine

## 2023-07-27 DIAGNOSIS — J4520 Mild intermittent asthma, uncomplicated: Secondary | ICD-10-CM

## 2023-07-30 NOTE — Telephone Encounter (Signed)
 Requested medication (s) are due for refill today: na   Requested medication (s) are on the active medication list: yes listed as fluticasone - salmatreral / wixela inhub Last refill:  03/27/23 1 each 3 refills.   Future visit scheduled: yes in 1 week   Notes to clinic:  do you want to refill Rx? Listed as wixela inhub instead of Advair pharmacy requesting 60 each and 3 refills.      Requested Prescriptions  Pending Prescriptions Disp Refills   WIXELA INHUB 100-50 MCG/ACT AEPB [Pharmacy Med Name: WIXELA 100-50 INHUB] 60 each 3    Sig: INHALE 1 PUFF INTO THE LUNGS TWICE A DAY     Pulmonology:  Combination Products Passed - 07/30/2023 10:25 AM      Passed - Valid encounter within last 12 months    Recent Outpatient Visits           1 month ago Bronchitis   [place] Primary Care & Sports Medicine at [facility], [place], MD   4 months ago Essential hypertension   [place] Primary Care & Sports Medicine at [facility], [place], MD       Future Appointments             In 1 week Gala Jubilee, [place], MD [facility] Primary Care & Sports Medicine at [facility], [facility]

## 2023-07-31 ENCOUNTER — Other Ambulatory Visit: Payer: Self-pay | Admitting: Internal Medicine

## 2023-07-31 DIAGNOSIS — I10 Essential (primary) hypertension: Secondary | ICD-10-CM

## 2023-07-31 DIAGNOSIS — M778 Other enthesopathies, not elsewhere classified: Secondary | ICD-10-CM

## 2023-07-31 DIAGNOSIS — F39 Unspecified mood [affective] disorder: Secondary | ICD-10-CM

## 2023-07-31 DIAGNOSIS — E118 Type 2 diabetes mellitus with unspecified complications: Secondary | ICD-10-CM

## 2023-07-31 DIAGNOSIS — F411 Generalized anxiety disorder: Secondary | ICD-10-CM

## 2023-08-02 NOTE — Telephone Encounter (Signed)
 Requested medication (s) are due for refill today:   Yes for all  Requested medication (s) are on the active medication list:   Yes for all  Future visit scheduled:   Yes 08/07/2023   LOV 06/03/2023   Last ordered: Various dates and refills.      One non delegated Robaxin .   Labs due for others.   Requested Prescriptions  Pending Prescriptions Disp Refills   metoprolol  succinate (TOPROL -XL) 100 MG 24 hr tablet [Pharmacy Med Name: METOPROLOL  SUCC ER 100 MG TAB] 90 tablet 0    Sig: TAKE 0.5 TABLETS (50 MG TOTAL) BY MOUTH IN THE MORNING AND AT BEDTIME.     Cardiovascular:  Beta Blockers Passed - 08/02/2023 12:00 PM      Passed - Last BP in normal range    BP Readings from Last 1 Encounters:  06/03/23 120/70         Passed - Last Heart Rate in normal range    Pulse Readings from Last 1 Encounters:  06/03/23 74         Passed - Valid encounter within last 6 months    Recent Outpatient Visits           2 months ago Bronchitis   [place] [facility] at [facility], Chales [place], MD   4 months ago Essential hypertension   [place] [facility] at [facility], [place]ales [place], MD       Future Appointments             In 5 days Sheron Dixons, MD [facility] Health Primary Care & Sports Medicine at [facility], [facility]             TRULICITY  1.5 MG/0.5ML Camie Cease Med Name: TRULICITY  1.5 MG/0.5 ML PEN]  1    Sig: INJECT 1.5 MG SUBCUTANEOUSLY ONCE A WEEK     Endocrinology:  Diabetes - GLP-1 Receptor Agonists Failed - 08/02/2023 12:00 PM      Failed - HBA1C is between 0 and 7.9 and within 180 days    HB A1C (BAYER DCA - WAIVED)  Date Value Ref Range Status  07/28/2014 6.9 <7.0 % Final    Comment:                                          Diabetic Adult            <7.0                                       Healthy Adult        4.3 - 5.7                                                           (DCCT/NGSP) American  Diabetes Association's Summary of Glycemic Recommendations for Adults with Diabetes: Hemoglobin A1c <7.0%. More stringent glycemic goals (A1c <6.0%) may further reduce complications at the cost of increased risk of hypoglycemia.    Hgb A1c MFr Bld  Date Value Ref Range Status  03/27/2023 8.2 (H) 4.8 -  5.6 % Final    Comment:             Prediabetes: 5.7 - 6.4          Diabetes: >6.4          Glycemic control for adults with diabetes: <7.0          Passed - Valid encounter within last 6 months    Recent Outpatient Visits           2 months ago Bronchitis   [place] [facility] at [facility], Chales [place], MD   4 months ago Essential hypertension   [place] [facility] at [facility], [place]ales [place], MD       Future Appointments             In 5 days Sheron Dixons, MD [facility] Health Primary Care & Sports Medicine at [facility], [facility]             DULoxetine  (CYMBALTA ) 60 MG capsule [Pharmacy Med Name: DULOXETINE  HCL DR 60 MG CAP] 180 capsule 0    Sig: TAKE 1 CAPSULE BY MOUTH TWICE A DAY     Psychiatry: Antidepressants - SNRI - duloxetine  Failed - 08/02/2023 12:00 PM      Failed - Cr in normal range and within 360 days    Creatinine  Date Value Ref Range Status  07/08/2013 0.92 0.60 - 1.30 mg/dL Final   Creatinine, Ser  Date Value Ref Range Status  08/01/2022 0.89 0.57 - 1.00 mg/dL Final         Failed - eGFR is 30 or above and within 360 days    EGFR (African American)  Date Value Ref Range Status  07/08/2013 >60  Final   GFR calc Af Amer  Date Value Ref Range Status  07/21/2019 89 >59 mL/min/1.73 Final    Comment:    **[facility] currently reports eGFR in compliance with the current**   recommendations of the [employer]. [facility] will   update reporting as new guidelines are published from the NKF-ASN   Task force.    EGFR (Non-African Amer.)  Date Value Ref Range Status   07/08/2013 >60  Final    Comment:    eGFR values <58mL/min/1.73 m2 may be an indication of chronic kidney disease (CKD). Calculated eGFR is useful in patients with stable renal function. The eGFR calculation will not be reliable in acutely ill patients when serum creatinine is changing rapidly. It is not useful in  patients on dialysis. The eGFR calculation may not be applicable to patients at the low and high extremes of body sizes, pregnant women, and vegetarians.    GFR, Estimated  Date Value Ref Range Status  05/06/2021 51 (L) >60 mL/min Final    Comment:    (NOTE) Calculated using the CKD-EPI Creatinine Equation (2021)    eGFR  Date Value Ref Range Status  08/01/2022 72 >59 mL/min/1.73 Final         Passed - Completed PHQ-2 or PHQ-9 in the last 360 days      Passed - Last BP in normal range    BP Readings from Last 1 Encounters:  06/03/23 120/70         Passed - Valid encounter within last 6 months    Recent Outpatient Visits           2 months ago Bronchitis   [facility] Primary Care & Sports Medicine  at [facility], [place], MD   4 months ago Essential hypertension   [place] [facility] at [facility], [place], MD       Future Appointments             In 5 days Gala Jubilee [place], MD [facility] Primary Care & Sports Medicine at [facility], [facility]             methocarbamol  (ROBAXIN ) 500 MG tablet [Pharmacy Med Name: METHOCARBAMOL  500 MG TABLET] 30 tablet 0    Sig: TAKE 1 TABLET BY MOUTH AT BEDTIME.     Not Delegated - Analgesics:  Muscle Relaxants Failed - 08/02/2023 12:00 PM      Failed - This refill cannot be delegated      Passed - Valid encounter within last 6 months    Recent Outpatient Visits           2 months ago Bronchitis   [place] [facility] at [facility], [place]ales [place], MD   4 months ago Essential hypertension   [place] [facility] at [facility], [place]ales [place], MD       Future Appointments             In 5 days Sheron Dixons, MD [facility] Primary Care & Sports Medicine at [facility], [facility]             metFORMIN  (GLUCOPHAGE -XR) 500 MG 24 hr tablet [Pharmacy Med Name: METFORMIN  HCL ER 500 MG TABLET] 90 tablet 0    Sig: TAKE 1 TABLET BY MOUTH EVERY DAY WITH BREAKFAST     Endocrinology:  Diabetes - Biguanides Failed - 08/02/2023 12:00 PM      Failed - Cr in normal range and within 360 days    Creatinine  Date Value Ref Range Status  07/08/2013 0.92 0.60 - 1.30 mg/dL Final   Creatinine, Ser  Date Value Ref Range Status  08/01/2022 0.89 0.57 - 1.00 mg/dL Final         Failed - HBA1C is between 0 and 7.9 and within 180 days    HB A1C (BAYER DCA - WAIVED)  Date Value Ref Range Status  07/28/2014 6.9 <7.0 % Final    Comment:                                          Diabetic Adult            <7.0                                       Healthy Adult        4.3 - 5.7                                                           (DCCT/NGSP) American Diabetes Association's Summary of Glycemic Recommendations for Adults with Diabetes: Hemoglobin A1c <7.0%. More stringent glycemic goals (A1c <6.0%) may further reduce complications at the cost of increased risk of hypoglycemia.    Hgb A1c  MFr Bld  Date Value Ref Range Status  03/27/2023 8.2 (H) 4.8 - 5.6 % Final    Comment:             Prediabetes: 5.7 - 6.4          Diabetes: >6.4          Glycemic control for adults with diabetes: <7.0          Failed - eGFR in normal range and within 360 days    EGFR (African American)  Date Value Ref Range Status  07/08/2013 >60  Final   GFR calc Af Amer  Date Value Ref Range Status  07/21/2019 89 >59 mL/min/1.73 Final    Comment:    **[facility] currently reports eGFR in compliance with the current**   recommendations of the [employer]. [facility] will   update reporting as new  guidelines are published from the NKF-ASN   Task force.    EGFR (Non-African Amer.)  Date Value Ref Range Status  07/08/2013 >60  Final    Comment:    eGFR values <94mL/min/1.73 m2 may be an indication of chronic kidney disease (CKD). Calculated eGFR is useful in patients with stable renal function. The eGFR calculation will not be reliable in acutely ill patients when serum creatinine is changing rapidly. It is not useful in  patients on dialysis. The eGFR calculation may not be applicable to patients at the low and high extremes of body sizes, pregnant women, and vegetarians.    GFR, Estimated  Date Value Ref Range Status  05/06/2021 51 (L) >60 mL/min Final    Comment:    (NOTE) Calculated using the CKD-EPI Creatinine Equation (2021)    eGFR  Date Value Ref Range Status  08/01/2022 72 >59 mL/min/1.73 Final         Failed - B12 Level in normal range and within 720 days    No results found for: VITAMINB12       Failed - CBC within normal limits and completed in the last 12 months    WBC  Date Value Ref Range Status  08/01/2022 7.8 3.4 - 10.8 x10E3/uL Final  05/06/2021 9.6 4.0 - 10.5 K/uL Final   RBC  Date Value Ref Range Status  08/01/2022 4.71 3.77 - 5.28 x10E6/uL Final  05/06/2021 4.97 3.87 - 5.11 MIL/uL Final   Hemoglobin  Date Value Ref Range Status  08/01/2022 14.0 11.1 - 15.9 g/dL Final   Hematocrit  Date Value Ref Range Status  08/01/2022 41.4 34.0 - 46.6 % Final   MCHC  Date Value Ref Range Status  08/01/2022 33.8 31.5 - 35.7 g/dL Final  16/11/9602 54.0 30.0 - 36.0 g/dL Final   [facility]  Date Value Ref Range Status  08/01/2022 29.7 26.6 - 33.0 pg Final  05/06/2021 29.0 26.0 - 34.0 pg Final   MCV  Date Value Ref Range Status  08/01/2022 88 79 - 97 fL Final  09/28/2013 85 80 - 100 fL Final   No results found for: PLTCOUNTKUC, LABPLAT, POCPLA RDW  Date Value Ref Range Status  08/01/2022 13.0 11.7 - 15.4 % Final  09/28/2013 12.9 11.5 -  14.5 % Final         Passed - Valid encounter within last 6 months    Recent Outpatient Visits           2 months ago Bronchitis   [facility] Primary Care & Sports Medicine at [facility], Chales [place], MD   4 months  ago Essential hypertension   [place] [facility] at [facility], Chales [place], MD       Future Appointments             In 5 days Sheron Dixons, MD [facility] Primary Care & Sports Medicine at [facility], [facility]             losartan  (COZAAR ) 100 MG tablet [Pharmacy Med Name: LOSARTAN  POTASSIUM 100 MG TAB] 90 tablet 0    Sig: TAKE 1 TABLET BY MOUTH EVERY DAY     Cardiovascular:  Angiotensin Receptor Blockers Failed - 08/02/2023 12:00 PM      Failed - Cr in normal range and within 180 days    Creatinine  Date Value Ref Range Status  07/08/2013 0.92 0.60 - 1.30 mg/dL Final   Creatinine, Ser  Date Value Ref Range Status  08/01/2022 0.89 0.57 - 1.00 mg/dL Final         Failed - K in normal range and within 180 days    Potassium  Date Value Ref Range Status  08/01/2022 5.4 (H) 3.5 - 5.2 mmol/L Final  07/08/2013 3.5 3.5 - 5.1 mmol/L Final         Passed - Patient is not pregnant      Passed - Last BP in normal range    BP Readings from Last 1 Encounters:  06/03/23 120/70         Passed - Valid encounter within last 6 months    Recent Outpatient Visits           2 months ago Bronchitis   [place] [facility] at [facility], Chales [place], MD   4 months ago Essential hypertension   [place] [facility] at [facility], [place]ales [place], MD       Future Appointments             In 5 days Sheron Dixons, MD [facility] Primary Care & Sports Medicine at [facility], [facility]             amLODipine  (NORVASC ) 5 MG tablet [Pharmacy Med Name: AMLODIPINE  BESYLATE 5 MG TAB] 90 tablet 3    Sig: TAKE 1 TABLET (5 MG TOTAL) BY MOUTH DAILY.      Cardiovascular: Calcium  Channel Blockers 2 Passed - 08/02/2023 12:00 PM      Passed - Last BP in normal range    BP Readings from Last 1 Encounters:  06/03/23 120/70         Passed - Last Heart Rate in normal range    Pulse Readings from Last 1 Encounters:  06/03/23 74         Passed - Valid encounter within last 6 months    Recent Outpatient Visits           2 months ago Bronchitis   [place] [facility] at [facility], Chales [place], MD   4 months ago Essential hypertension   [place] [facility] at [facility], [place]ales [place], MD       Future Appointments             In 5 days Sheron Dixons, MD [facility] Medicine at [facility], [facility]             busPIRone  (BUSPAR ) 10 MG tablet [Pharmacy Med Name: BUSPIRONE  HCL  10 MG TABLET] 270 tablet 1    Sig: TAKE 1 TABLET BY MOUTH THREE TIMES A DAY     Psychiatry: Anxiolytics/Hypnotics - Non-controlled Passed - 08/02/2023 12:00 PM      Passed - Valid encounter within last 12 months    Recent Outpatient Visits           2 months ago Bronchitis   [place] [facility] at [facility], Chales [place], MD   4 months ago Essential hypertension   [facility] Primary Care & Sports Medicine at [facility], Chales [place], MD       Future Appointments             In 5 days Gala Jubilee [place], MD [facility] Health Primary Care & Sports Medicine at [facility], [facility]

## 2023-08-07 ENCOUNTER — Encounter: Payer: Self-pay | Admitting: Internal Medicine

## 2023-08-07 ENCOUNTER — Ambulatory Visit (INDEPENDENT_AMBULATORY_CARE_PROVIDER_SITE_OTHER): Payer: PRIVATE HEALTH INSURANCE | Admitting: Internal Medicine

## 2023-08-07 VITALS — BP 122/70 | HR 83 | Ht 60.0 in | Wt 127.0 lb

## 2023-08-07 DIAGNOSIS — E118 Type 2 diabetes mellitus with unspecified complications: Secondary | ICD-10-CM

## 2023-08-07 DIAGNOSIS — E1169 Type 2 diabetes mellitus with other specified complication: Secondary | ICD-10-CM

## 2023-08-07 DIAGNOSIS — I10 Essential (primary) hypertension: Secondary | ICD-10-CM

## 2023-08-07 DIAGNOSIS — Z0000 Encounter for general adult medical examination without abnormal findings: Secondary | ICD-10-CM

## 2023-08-07 DIAGNOSIS — F5101 Primary insomnia: Secondary | ICD-10-CM | POA: Insufficient documentation

## 2023-08-07 DIAGNOSIS — Z1211 Encounter for screening for malignant neoplasm of colon: Secondary | ICD-10-CM | POA: Diagnosis not present

## 2023-08-07 DIAGNOSIS — Z7985 Long-term (current) use of injectable non-insulin antidiabetic drugs: Secondary | ICD-10-CM

## 2023-08-07 DIAGNOSIS — E785 Hyperlipidemia, unspecified: Secondary | ICD-10-CM

## 2023-08-07 DIAGNOSIS — E2839 Other primary ovarian failure: Secondary | ICD-10-CM

## 2023-08-07 DIAGNOSIS — K219 Gastro-esophageal reflux disease without esophagitis: Secondary | ICD-10-CM | POA: Diagnosis not present

## 2023-08-07 DIAGNOSIS — F324 Major depressive disorder, single episode, in partial remission: Secondary | ICD-10-CM

## 2023-08-07 MED ORDER — TRAZODONE HCL 50 MG PO TABS: 25.0000 mg | ORAL_TABLET | Freq: Every evening | ORAL | 0 refills | Status: DC | PRN

## 2023-08-07 MED ORDER — TRULICITY 1.5 MG/0.5ML ~~LOC~~ SOAJ: 1.5000 mg | SUBCUTANEOUS | 3 refills | Status: AC

## 2023-08-07 NOTE — Assessment & Plan Note (Signed)
 LDL is  Lab Results  Component Value Date   LDLCALC 63 08/01/2022   Current regimen is Atorvastatin  and Vascepa .  No medication side effects noted. Goal LDL is <70.

## 2023-08-07 NOTE — Assessment & Plan Note (Signed)
 Reflux symptoms are controlled on omeprazole  bid. Patient denies red flag symptoms - no melena, weight loss, dysphagia.

## 2023-08-07 NOTE — Assessment & Plan Note (Signed)
 Blood pressure is well controlled.  Current medications losartan , amlodipine  and metoprolol . Will continue same regimen along with efforts to limit dietary sodium.

## 2023-08-07 NOTE — Assessment & Plan Note (Addendum)
 Blood sugars have been stable.  No recent hypoglycemic events requiring assistance. Currently medications are MTF and Trulicity . Lab Results  Component Value Date   HGBA1C 8.2 (H) 03/27/2023   Last visit Trulicity  dose was increased to 1.5 mg. Moderate urine protein was also noted.

## 2023-08-07 NOTE — Progress Notes (Signed)
 Date:  08/07/2023   Name:  Tracy Bryan   DOB:  1960/09/02   MRN:  [id number]   Chief Complaint: Annual Exam Tracy Bryan is a 63 y.o. female who presents today for her Complete Annual Exam. She feels well. She reports exercising- none. She reports she is sleeping fairly well. Breast complaints - none.  Health Maintenance  Topic Date Due   HIV Screening  Never done   DEXA scan (bone density measurement)  Never done   Colon Cancer Screening  07/23/2023   Yearly kidney function blood test for diabetes  08/01/2023   Flu Shot  09/13/2023   Hemoglobin A1C  09/24/2023   Yearly kidney health urinalysis for diabetes  03/26/2024   Mammogram  04/09/2024   Eye exam for diabetics  06/10/2024   Complete foot exam   08/06/2024   DTaP/Tdap/Td vaccine (3 - Td or Tdap) 07/31/2026   Pneumococcal Vaccine for age over 106  Completed   Hepatitis C Screening  Completed   Zoster (Shingles) Vaccine  Completed   Hepatitis B Vaccine  Aged Out   HPV Vaccine  Aged Out   Meningitis B Vaccine  Aged Out   COVID-19 Vaccine  Discontinued    Diabetes She presents for her follow-up diabetic visit. She has type 2 diabetes mellitus. Her disease course has been stable. Pertinent negatives for hypoglycemia include no dizziness or headaches. Pertinent negatives for diabetes include no chest pain, no fatigue and no weakness. Pertinent negatives for diabetic complications include no CVA. Current diabetic treatments: MTF and Trulicity . Her weight is stable. Eye exam is current.  Hyperlipidemia This is a chronic problem. The problem is controlled. Pertinent negatives include no chest pain, myalgias or shortness of breath. Current antihyperlipidemic treatment includes statins and herbal therapy. The current treatment provides significant improvement of lipids.  Hypertension This is a chronic problem. The problem is controlled. Pertinent negatives include no chest pain, headaches, palpitations or shortness of  breath. Past treatments include calcium  channel blockers, angiotensin blockers and beta blockers. There is no history of kidney disease, CAD/MI or CVA.  Depression        This is a chronic problem.The problem is unchanged.  Associated symptoms include insomnia.  Associated symptoms include no fatigue, no myalgias and no headaches.  Past treatments include other medications and SNRIs - Serotonin and norepinephrine reuptake inhibitors.  Compliance with treatment is good. Gastroesophageal Reflux She complains of heartburn. She reports no abdominal pain, no chest pain, no coughing or no wheezing. This is a recurrent problem. The problem occurs occasionally. Pertinent negatives include no fatigue. She has tried a PPI for the symptoms.  Insomnia Primary symptoms: difficulty falling asleep, frequent awakening.   The onset quality is undetermined. The problem occurs nightly. PMH includes: depression.     Review of Systems  Constitutional:  Negative for fatigue and unexpected weight change.  HENT:  Negative for trouble swallowing.   Eyes:  Negative for visual disturbance.  Respiratory:  Negative for cough, chest tightness, shortness of breath and wheezing.   Cardiovascular:  Negative for chest pain, palpitations and leg swelling.  Gastrointestinal:  Positive for heartburn. Negative for abdominal pain, constipation and diarrhea.  Musculoskeletal:  Negative for arthralgias and myalgias.  Neurological:  Negative for dizziness, weakness, light-headedness and headaches.  Psychiatric/Behavioral:  Positive for depression. The patient has insomnia.      Lab Results  Component Value Date   NA 140 08/01/2022   K 5.4 (H) 08/01/2022   CO2  22 08/01/2022   GLUCOSE 122 (H) 08/01/2022   BUN 14 08/01/2022   CREATININE 0.89 08/01/2022   CALCIUM  9.7 08/01/2022   EGFR 72 08/01/2022   GFRNONAA 51 (L) 05/06/2021   Lab Results  Component Value Date   CHOL 169 08/01/2022   HDL 49 08/01/2022   LDLCALC 63  08/01/2022   TRIG 368 (H) 08/01/2022   CHOLHDL 3.4 08/01/2022   Lab Results  Component Value Date   TSH 1.280 08/01/2022   Lab Results  Component Value Date   HGBA1C 8.2 (H) 03/27/2023   Lab Results  Component Value Date   WBC 7.8 08/01/2022   HGB 14.0 08/01/2022   HCT 41.4 08/01/2022   MCV 88 08/01/2022   PLT 297 08/01/2022   Lab Results  Component Value Date   ALT 23 08/01/2022   AST 20 08/01/2022   ALKPHOS 71 08/01/2022   BILITOT 0.3 08/01/2022   Lab Results  Component Value Date   VD25OH 52.8 05/28/2017     Patient Active Problem List   Diagnosis Date Noted   Primary insomnia 08/07/2023   Reactive airway disease 03/27/2023   Shoulder tendonitis, right 03/27/2023   Chronic pain of both knees 11/20/2022   Stage 3a chronic kidney disease (HCC) 08/01/2022   Major depressive disorder with single episode, 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   Muscle tension headache 03/30/2020   Status post total hysterectomy 03/23/2020   Bilateral carpal tunnel syndrome 11/07/2016   Herpes simplex infection 11/23/2015   Fibromyalgia 03/11/2015   Menopause syndrome 02/17/2015   Esophageal reflux 02/17/2015   Vitamin D  deficiency 02/17/2015   Hyperlipidemia associated with type 2 diabetes mellitus (HCC) 07/28/2014   Essential hypertension 07/28/2014   Type II diabetes mellitus with complication (HCC) 05/18/2014    Allergies  Allergen Reactions   Rabeprazole  Sodium     C Diff Side Affects   Augmentin  [Amoxicillin -Pot Clavulanate] Itching   Benazepril Hcl Cough   Penicillins Other (See Comments)   Jardiance  [Empagliflozin ] Rash    Recurrent yeast vaginitis    Past Surgical History:  Procedure Laterality Date   CERVICAL DISC SURGERY     COLONOSCOPY  07/22/2013   DILATION AND CURETTAGE OF UTERUS  1996   HERNIA REPAIR  2000   Umblical   HIP ARTHROPLASTY Right    TOTAL ABDOMINAL HYSTERECTOMY     TOTAL HIP ARTHROPLASTY Left 05/18/2014   Procedure: LEFT TOTAL HIP  ARTHROPLASTY ANTERIOR APPROACH;  Surgeon: Maude Herald, MD;  Location: [facility] OR;  Service: Orthopedics;  Laterality: Left;    Social History   Tobacco Use   Smoking status: Former    Current packs/day: 0.00    Types: Cigarettes    Quit date: 06/14/1984    Years since quitting: 39.1   Smokeless tobacco: Never  Vaping Use   Vaping status: Never Used  Substance Use Topics   Alcohol use: Not Currently   Drug use: Never     Medication list has been reviewed and updated.  Current Meds  Medication Sig   albuterol  (VENTOLIN  HFA) 108 (90 Base) MCG/ACT inhaler TAKE 2 PUFFS BY MOUTH EVERY 6 HOURS AS NEEDED FOR WHEEZE OR SHORTNESS OF BREATH   amLODipine  (NORVASC ) 5 MG tablet TAKE 1 TABLET (5 MG TOTAL) BY MOUTH DAILY.   aspirin  81 MG tablet Take 81 mg by mouth daily.   atorvastatin  (LIPITOR) 40 MG tablet Take 1 tablet (40 mg total) by mouth daily.   busPIRone  (BUSPAR ) 10 MG tablet TAKE 1 TABLET BY MOUTH  THREE TIMES A DAY   chlorpheniramine-HYDROcodone  (TUSSIONEX) 10-8 MG/5ML Take 5 mLs by mouth every 12 (twelve) hours as needed.   Cholecalciferol (VITAMIN D ) 2000 units CAPS Take 1 capsule (2,000 Units total) by mouth daily.   DULoxetine  (CYMBALTA ) 60 MG capsule TAKE 1 CAPSULE BY MOUTH TWICE A DAY   fluticasone -salmeterol (WIXELA INHUB) 100-50 MCG/ACT AEPB INHALE 1 PUFF INTO THE LUNGS TWICE A DAY   losartan  (COZAAR ) 100 MG tablet TAKE 1 TABLET BY MOUTH EVERY DAY   meloxicam  (MOBIC ) 15 MG tablet TAKE 1 TABLET (15 MG TOTAL) BY MOUTH DAILY.   metFORMIN  (GLUCOPHAGE -XR) 500 MG 24 hr tablet TAKE 1 TABLET BY MOUTH EVERY DAY WITH BREAKFAST   methocarbamol  (ROBAXIN ) 500 MG tablet TAKE 1 TABLET BY MOUTH AT BEDTIME.   metoprolol  succinate (TOPROL -XL) 100 MG 24 hr tablet TAKE 0.5 TABLETS (50 MG TOTAL) BY MOUTH IN THE MORNING AND AT BEDTIME.   Multiple Vitamins-Minerals (MULTIVITAMIN WITH MINERALS) tablet Take 1 tablet by mouth daily.   omeprazole  (PRILOSEC) 20 MG capsule TAKE 1 CAPSULE (20 MG TOTAL) BY  MOUTH 2 (TWO) TIMES DAILY BEFORE A MEAL.   traZODone (DESYREL) 50 MG tablet Take 0.5-1 tablets (25-50 mg total) by mouth at bedtime as needed for sleep.   valACYclovir  (VALTREX ) 1000 MG tablet TAKE 1 TABLET BY MOUTH TWICE A DAY   [DISCONTINUED] Dulaglutide  (TRULICITY ) 1.5 MG/0.5ML SOAJ INJECT 1.5 MG SUBCUTANEOUSLY ONCE A WEEK   [DISCONTINUED] Icosapent  Ethyl 0.5 g CAPS TAKE 2 CAPSULES BY MOUTH TWICE A DAY       08/07/2023    8:10 AM 06/03/2023   11:22 AM 03/27/2023   11:34 AM 11/20/2022   11:03 AM  GAD 7 : Generalized Anxiety Score  Nervous, Anxious, on Edge 2 2 2 2   Control/stop worrying 2 2 2 2   Worry too much - different things 2 2 2 2   Trouble relaxing 2 2 1 2   Restless 2 2 1 2   Easily annoyed or irritable 2 2 1 2   Afraid - awful might happen 1 0 0 1  Total GAD 7 Score 13 12 9 13   Anxiety Difficulty Very difficult Somewhat difficult Not difficult at all Very difficult       08/07/2023    8:10 AM 06/03/2023   11:22 AM 03/27/2023   11:34 AM  Depression screen PHQ 2/9  Decreased Interest 1 2 1   Down, Depressed, Hopeless 0 1 1  PHQ - 2 Score 1 3 2   Altered sleeping 2 2 0  Tired, decreased energy 2 2 1   Change in appetite 2 1 0  Feeling bad or failure about yourself  0 0 0  Trouble concentrating 1 2 0  Moving slowly or fidgety/restless 0 0 1  Suicidal thoughts 0 0 0  PHQ-9 Score 8 10 4   Difficult doing work/chores Not difficult at all  Not difficult at all    BP Readings from Last 3 Encounters:  08/07/23 122/70  06/03/23 120/70  05/27/23 (!) 140/81    Physical Exam Vitals and nursing note reviewed.  Constitutional:      General: She is not in acute distress.    Appearance: She is well-developed.  HENT:     Head: Normocephalic and atraumatic.     Right Ear: Tympanic membrane and ear canal normal.     Left Ear: Tympanic membrane and ear canal normal.     Nose:     Right Sinus: No maxillary sinus tenderness.     Left Sinus: No maxillary  sinus tenderness.   Eyes:      General: No scleral icterus.       Right eye: No discharge.        Left eye: No discharge.     Conjunctiva/sclera: Conjunctivae normal.   Neck:     Thyroid: No thyromegaly.     Vascular: No carotid bruit.   Cardiovascular:     Rate and Rhythm: Normal rate and regular rhythm.     Pulses: Normal pulses.     Heart sounds: Normal heart sounds.  Pulmonary:     Effort: Pulmonary effort is normal. No respiratory distress.     Breath sounds: No wheezing.  Abdominal:     General: Bowel sounds are normal.     Palpations: Abdomen is soft.     Tenderness: There is no abdominal tenderness.   Musculoskeletal:     Cervical back: Normal range of motion. No erythema.     Right lower leg: No edema.     Left lower leg: No edema.  Lymphadenopathy:     Cervical: No cervical adenopathy.   Skin:    General: Skin is warm and dry.     Findings: No rash.   Neurological:     Mental Status: She is alert and oriented to person, place, and time.     Cranial Nerves: No cranial nerve deficit.     Sensory: No sensory deficit.     Deep Tendon Reflexes: Reflexes are normal and symmetric.   Psychiatric:        Attention and Perception: Attention normal.        Mood and Affect: Mood normal.    Diabetic Foot Exam - Simple   Simple Foot Form Diabetic Foot exam was performed with the following findings: Yes 08/07/2023  8:21 AM  Visual Inspection No deformities, no ulcerations, no other skin breakdown bilaterally: Yes Sensation Testing Intact to touch and monofilament testing bilaterally: Yes Pulse Check Posterior Tibialis and Dorsalis pulse intact bilaterally: Yes Comments      Wt Readings from Last 3 Encounters:  08/07/23 127 lb (57.6 kg)  06/03/23 124 lb 2 oz (56.3 kg)  05/27/23 122 lb (55.3 kg)    BP 122/70   Pulse 83   Ht 5' (1.524 m)   Wt 127 lb (57.6 kg)   LMP  (LMP Unknown)   SpO2 97%   BMI 24.80 kg/m   Assessment and Plan:  Problem List Items Addressed This Visit        Unprioritized   Type II diabetes mellitus with complication (HCC) (Chronic)   Blood sugars have been stable.  No recent hypoglycemic events requiring assistance. Currently medications are MTF and Trulicity . Lab Results  Component Value Date   HGBA1C 8.2 (H) 03/27/2023   Last visit Trulicity  dose was increased to 1.5 mg. Moderate urine protein was also noted.       Relevant Medications   Dulaglutide  (TRULICITY ) 1.5 MG/0.5ML SOAJ   Other Relevant Orders   Comprehensive metabolic panel with GFR   Hemoglobin A1c   Microalbumin / creatinine urine ratio   Hyperlipidemia associated with type 2 diabetes mellitus (HCC) (Chronic)   LDL is  Lab Results  Component Value Date   LDLCALC 63 08/01/2022   Current regimen is Atorvastatin  and Vascepa .  No medication side effects noted. Goal LDL is <70.       Relevant Medications   Dulaglutide  (TRULICITY ) 1.5 MG/0.5ML SOAJ   Other Relevant Orders   Lipid panel   Essential hypertension (Chronic)  Blood pressure is well controlled.  Current medications losartan , amlodipine  and metoprolol . Will continue same regimen along with efforts to limit dietary sodium.       Relevant Orders   CBC with Differential/Platelet   TSH   Urinalysis, Routine w reflex microscopic   Esophageal reflux (Chronic)   Reflux symptoms are controlled on omeprazole  bid. Patient denies red flag symptoms - no melena, weight loss, dysphagia.       Relevant Orders   CBC with Differential/Platelet   Major depressive disorder with single episode, in partial remission (HCC) (Chronic)   Stable depressive symptoms on Cymbalta  and Buspar . No SI/HI or other concerns. Sleep is an issues - will prescribeTrazodone      Relevant Medications   traZODone (DESYREL) 50 MG tablet   Other Relevant Orders   TSH   Primary insomnia   Ongoing difficulty with restful sleep. Struggles to go to sleep then wakes frequently for bathroom visits. Will try Trazodone 25- 50 mg at  bedtime.      Relevant Medications   traZODone (DESYREL) 50 MG tablet   Other Visit Diagnoses       Annual physical exam    -  Primary   mammogram up to date immunizations up to date     Colon cancer screening       Relevant Orders   Cologuard     Menopause ovarian failure       Relevant Orders   DG Bone Density     Long-term current use of injectable noninsulin antidiabetic medication           Return in about 4 months (around 12/07/2023) for DM, HTN.    Tracy HILARIO Adie, MD [facility] [place]

## 2023-08-07 NOTE — Assessment & Plan Note (Addendum)
 Stable depressive symptoms on Cymbalta  and Buspar . No SI/HI or other concerns. Sleep is an issues - will prescribeTrazodone

## 2023-08-07 NOTE — Assessment & Plan Note (Signed)
 Ongoing difficulty with restful sleep. Struggles to go to sleep then wakes frequently for bathroom visits. Will try Trazodone 25- 50 mg at bedtime.

## 2023-08-07 NOTE — Patient Instructions (Signed)
 Call [facility] to schedule your Bone Density/DEXA scan at [phone].

## 2023-08-08 ENCOUNTER — Ambulatory Visit: Payer: Self-pay | Admitting: Internal Medicine

## 2023-08-08 LAB — MICROSCOPIC EXAMINATION
Bacteria, UA: NONE SEEN
Casts: NONE SEEN /LPF

## 2023-08-08 LAB — CBC WITH DIFFERENTIAL/PLATELET
Basophils Absolute: 0.1 10*3/uL (ref 0.0–0.2)
Basos: 1 %
EOS (ABSOLUTE): 0.5 10*3/uL — ABNORMAL HIGH (ref 0.0–0.4)
Eos: 6 %
Hematocrit: 42 % (ref 34.0–46.6)
Hemoglobin: 13.5 g/dL (ref 11.1–15.9)
Immature Grans (Abs): 0 10*3/uL (ref 0.0–0.1)
Immature Granulocytes: 0 %
Lymphocytes Absolute: 2.1 10*3/uL (ref 0.7–3.1)
Lymphs: 24 %
MCH: 28.8 pg (ref 26.6–33.0)
MCHC: 32.1 g/dL (ref 31.5–35.7)
MCV: 90 fL (ref 79–97)
Monocytes Absolute: 0.7 10*3/uL (ref 0.1–0.9)
Monocytes: 8 %
Neutrophils Absolute: 5.1 10*3/uL (ref 1.4–7.0)
Neutrophils: 61 %
Platelets: 321 10*3/uL (ref 150–450)
RBC: 4.68 x10E6/uL (ref 3.77–5.28)
RDW: 13.7 % (ref 11.7–15.4)
WBC: 8.4 10*3/uL (ref 3.4–10.8)

## 2023-08-08 LAB — COMPREHENSIVE METABOLIC PANEL WITH GFR
ALT: 29 IU/L (ref 0–32)
AST: 31 IU/L (ref 0–40)
Albumin: 4.6 g/dL (ref 3.9–4.9)
Alkaline Phosphatase: 94 IU/L (ref 44–121)
BUN/Creatinine Ratio: 19 (ref 12–28)
BUN: 19 mg/dL (ref 8–27)
Bilirubin Total: 0.4 mg/dL (ref 0.0–1.2)
CO2: 21 mmol/L (ref 20–29)
Calcium: 10.1 mg/dL (ref 8.7–10.3)
Chloride: 98 mmol/L (ref 96–106)
Creatinine, Ser: 0.98 mg/dL (ref 0.57–1.00)
Globulin, Total: 2.8 g/dL (ref 1.5–4.5)
Glucose: 167 mg/dL — ABNORMAL HIGH (ref 70–99)
Potassium: 5.2 mmol/L (ref 3.5–5.2)
Sodium: 136 mmol/L (ref 134–144)
Total Protein: 7.4 g/dL (ref 6.0–8.5)
eGFR: 64 mL/min/{1.73_m2} (ref >59)

## 2023-08-08 LAB — MICROALBUMIN / CREATININE URINE RATIO
Creatinine, Urine: 154.9 mg/dL
Microalb/Creat Ratio: 282 mg/g{creat} — ABNORMAL HIGH (ref 0–29)
Microalbumin, Urine: 436.6 ug/mL

## 2023-08-08 LAB — URINALYSIS, ROUTINE W REFLEX MICROSCOPIC
Bilirubin, UA: NEGATIVE
Glucose, UA: NEGATIVE
Ketones, UA: NEGATIVE
Nitrite, UA: NEGATIVE
RBC, UA: NEGATIVE
Specific Gravity, UA: 1.024 (ref 1.005–1.030)
Urobilinogen, Ur: 1 mg/dL (ref 0.2–1.0)
pH, UA: 6.5 (ref 5.0–7.5)

## 2023-08-08 LAB — LIPID PANEL
Chol/HDL Ratio: 3.8 ratio (ref 0.0–4.4)
Cholesterol, Total: 192 mg/dL (ref 100–199)
HDL: 51 mg/dL (ref >39)
LDL Chol Calc (NIH): 72 mg/dL (ref 0–99)
Triglycerides: 441 mg/dL — ABNORMAL HIGH (ref 0–149)
VLDL Cholesterol Cal: 69 mg/dL — ABNORMAL HIGH (ref 5–40)

## 2023-08-08 LAB — HEMOGLOBIN A1C
Est. average glucose Bld gHb Est-mCnc: 180 mg/dL
Hgb A1c MFr Bld: 7.9 % — ABNORMAL HIGH (ref 4.8–5.6)

## 2023-08-08 LAB — TSH: TSH: 1.69 u[IU]/mL (ref 0.450–4.500)

## 2023-08-24 ENCOUNTER — Other Ambulatory Visit: Payer: Self-pay | Admitting: Internal Medicine

## 2023-08-24 DIAGNOSIS — F5101 Primary insomnia: Secondary | ICD-10-CM

## 2023-08-24 DIAGNOSIS — F39 Unspecified mood [affective] disorder: Secondary | ICD-10-CM

## 2023-08-27 NOTE — Telephone Encounter (Signed)
 Too soon for refill, refilled on 08/02/23 for 90 days.  Requested Prescriptions  Pending Prescriptions Disp Refills   metoprolol  succinate (TOPROL -XL) 100 MG 24 hr tablet [Pharmacy Med Name: METOPROLOL  SUCC ER 100 MG TAB] 90 tablet 0    Sig: TAKE 0.5 TABLETS (50 MG TOTAL) BY MOUTH IN THE MORNING AND AT BEDTIME.     Cardiovascular:  Beta Blockers Passed - 08/27/2023  8:23 AM      Passed - Last BP in normal range    BP Readings from Last 1 Encounters:  08/07/23 122/70         Passed - Last Heart Rate in normal range    Pulse Readings from Last 1 Encounters:  08/07/23 83         Passed - Valid encounter within last 6 months    Recent Outpatient Visits           2 weeks ago Annual physical exam   [place] [facility] at [facility], Leita DEL, MD   2 months ago Bronchitis   [place] [facility] Medicine at [facility], Leita DEL, MD   5 months ago Essential hypertension   [place] [facility] & Sports Medicine at [facility], Leita DEL, MD       Future Appointments             In 3 months Justus Leita DEL, MD [facility] at [facility]             traZODone  (DESYREL ) 50 MG tablet [Pharmacy Med Name: TRAZODONE  50 MG TABLET] 30 tablet 0    Sig: TAKE 0.5-1 TABLETS BY MOUTH AT BEDTIME AS NEEDED FOR SLEEP.     Psychiatry: Antidepressants - Serotonin Modulator Passed - 08/27/2023  8:23 AM      Passed - Completed PHQ-2 or PHQ-9 in the last 360 days      Passed - Valid encounter within last 6 months    Recent Outpatient Visits           2 weeks ago Annual physical exam   [place] [facility] at [facility], Leita DEL, MD   2 months ago Bronchitis   [facility] at [facility], Leita DEL, MD   5 months ago Essential hypertension   [place] [facility] Care [facility] Medicine at [facility], Leita DEL, MD       Future Appointments             In 3 months Justus Leita DEL, MD [facility] Medicine at [facility]             metFORMIN  (GLUCOPHAGE ) 500 MG tablet [Pharmacy Med Name: METFORMIN  HCL 500 MG TABLET] 90 tablet 3    Sig: TAKE 1 TABLET BY MOUTH EVERY DAY WITH BREAKFAST     Endocrinology:  Diabetes - Biguanides Failed - 08/27/2023  8:23 AM      Failed - B12 Level in normal range and within 720 days    No results found for: VITAMINB12       Passed - Cr in normal range and within 360 days    Creatinine  Date Value Ref Range Status  07/08/2013 0.92 0.60 - 1.30 mg/dL Final   Creatinine, Ser  Date Value Ref Range Status  08/07/2023 0.98 0.57 - 1.00 mg/dL  Final         Passed - HBA1C is between 0 and 7.9 and within 180 days    HB A1C (BAYER DCA - WAIVED)  Date Value Ref Range Status  07/28/2014 6.9 <7.0 % Final    Comment:                                          Diabetic Adult            <7.0                                       Healthy Adult        4.3 - 5.7                                                           (DCCT/NGSP) American Diabetes Association's Summary of Glycemic Recommendations for Adults with Diabetes: Hemoglobin A1c <7.0%. More stringent glycemic goals (A1c <6.0%) may further reduce complications at the cost of increased risk of hypoglycemia.    Hgb A1c MFr Bld  Date Value Ref Range Status  08/07/2023 7.9 (H) 4.8 - 5.6 % Final    Comment:             Prediabetes: 5.7 - 6.4          Diabetes: >6.4          Glycemic control for adults with diabetes: <7.0          Passed - eGFR in normal range and within 360 days    EGFR (African American)  Date Value Ref Range Status  07/08/2013 >60  Final   GFR calc Af Amer  Date Value Ref Range Status  07/21/2019 89 >59 mL/min/1.73 Final    Comment:    **[facility] currently reports eGFR in compliance with the current**   recommendations of  the [employer]. [facility] will   update reporting as new guidelines are published from the NKF-ASN   Task force.    EGFR (Non-African Amer.)  Date Value Ref Range Status  07/08/2013 >60  Final    Comment:    eGFR values <61mL/min/1.73 m2 may be an indication of chronic kidney disease (CKD). Calculated eGFR is useful in patients with stable renal function. The eGFR calculation will not be reliable in acutely ill patients when serum creatinine is changing rapidly. It is not useful in  patients on dialysis. The eGFR calculation may not be applicable to patients at the low and high extremes of body sizes, pregnant women, and vegetarians.    GFR, Estimated  Date Value Ref Range Status  05/06/2021 51 (L) >60 mL/min Final    Comment:    (NOTE) Calculated using the CKD-EPI Creatinine Equation (2021)    eGFR  Date Value Ref Range Status  08/07/2023 64 >59 mL/min/1.73 Final         Passed - Valid encounter within last 6 months    Recent Outpatient Visits           2 weeks ago Annual physical exam   [facility] at  [facility] Justus Leita VEAR, MD   2 months ago Bronchitis   [place] [facility] Care & Sports Medicine at [facility], Leita VEAR, MD   5 months ago Essential hypertension    Primary Care & Sports Medicine at [facility], Leita VEAR, MD       Future Appointments             In 3 months Justus Leita VEAR, MD [facility] Care & [facility] Medicine at [facility]            Passed - CBC within normal limits and completed in the last 12 months    WBC  Date Value Ref Range Status  08/07/2023 8.4 3.4 - 10.8 x10E3/uL Final  05/06/2021 9.6 4.0 - 10.5 K/uL Final   RBC  Date Value Ref Range Status  08/07/2023 4.68 3.77 - 5.28 x10E6/uL Final  05/06/2021 4.97 3.87 - 5.11 MIL/uL Final   Hemoglobin  Date Value Ref Range Status  08/07/2023 13.5 11.1 - 15.9 g/dL Final    Hematocrit  Date Value Ref Range Status  08/07/2023 42.0 34.0 - 46.6 % Final   MCHC  Date Value Ref Range Status  08/07/2023 32.1 31.5 - 35.7 g/dL Final  96/74/7976 66.9 30.0 - 36.0 g/dL Final   [facility]  Date Value Ref Range Status  08/07/2023 28.8 26.6 - 33.0 pg Final  05/06/2021 29.0 26.0 - 34.0 pg Final   MCV  Date Value Ref Range Status  08/07/2023 90 79 - 97 fL Final  09/28/2013 85 80 - 100 fL Final   No results found for: PLTCOUNTKUC, LABPLAT, POCPLA RDW  Date Value Ref Range Status  08/07/2023 13.7 11.7 - 15.4 % Final  09/28/2013 12.9 11.5 - 14.5 % Final          DULoxetine  (CYMBALTA ) 60 MG capsule [Pharmacy Med Name: DULOXETINE  HCL DR 60 MG CAP] 90 capsule     Sig: TAKE 1 CAPSULE BY MOUTH EVERY DAY     Psychiatry: Antidepressants - SNRI - duloxetine  Passed - 08/27/2023  8:23 AM      Passed - Cr in normal range and within 360 days    Creatinine  Date Value Ref Range Status  07/08/2013 0.92 0.60 - 1.30 mg/dL Final   Creatinine, Ser  Date Value Ref Range Status  08/07/2023 0.98 0.57 - 1.00 mg/dL Final         Passed - eGFR is 30 or above and within 360 days    EGFR (African American)  Date Value Ref Range Status  07/08/2013 >60  Final   GFR calc Af Amer  Date Value Ref Range Status  07/21/2019 89 >59 mL/min/1.73 Final    Comment:    **[facility] currently reports eGFR in compliance with the current**   recommendations of the [employer]. [facility] will   update reporting as new guidelines are published from the NKF-ASN   Task force.    EGFR (Non-African Amer.)  Date Value Ref Range Status  07/08/2013 >60  Final    Comment:    eGFR values <62mL/min/1.73 m2 may be an indication of chronic kidney disease (CKD). Calculated eGFR is useful in patients with stable renal function. The eGFR calculation will not be reliable in acutely ill patients when serum creatinine is changing rapidly. It is not useful in  patients on dialysis. The eGFR  calculation may not be applicable to patients at the low and high extremes of body sizes, pregnant women,  and vegetarians.    GFR, Estimated  Date Value Ref Range Status  05/06/2021 51 (L) >60 mL/min Final    Comment:    (NOTE) Calculated using the CKD-EPI Creatinine Equation (2021)    eGFR  Date Value Ref Range Status  08/07/2023 64 >59 mL/min/1.73 Final         Passed - Completed PHQ-2 or PHQ-9 in the last 360 days      Passed - Last BP in normal range    BP Readings from Last 1 Encounters:  08/07/23 122/70         Passed - Valid encounter within last 6 months    Recent Outpatient Visits           2 weeks ago Annual physical exam   [facility] at [facility], Leita DEL, MD   2 months ago Bronchitis   [facility] at [facility], Leita DEL, MD   5 months ago Essential hypertension   [facility] at [facility], Leita DEL, MD       Future Appointments             In 3 months Justus, Leita DEL, MD [facility] Primary Care & [facility] Medicine at [facility], [facility]

## 2023-08-29 ENCOUNTER — Other Ambulatory Visit: Payer: Self-pay | Admitting: Internal Medicine

## 2023-08-29 DIAGNOSIS — J4520 Mild intermittent asthma, uncomplicated: Secondary | ICD-10-CM

## 2023-08-30 ENCOUNTER — Other Ambulatory Visit: Payer: Self-pay | Admitting: Internal Medicine

## 2023-08-30 DIAGNOSIS — M778 Other enthesopathies, not elsewhere classified: Secondary | ICD-10-CM

## 2023-08-30 NOTE — Telephone Encounter (Signed)
 Requested medication (s) are due for refill today: yes  Requested medication (s) are on the active medication list: yes  Last refill:  08/02/23 #30  Future visit scheduled: yes  Notes to clinic:  med not delegated to NT to RF   Requested Prescriptions  Pending Prescriptions Disp Refills   methocarbamol  (ROBAXIN ) 500 MG tablet [Pharmacy Med Name: METHOCARBAMOL  500 MG TABLET] 30 tablet 0    Sig: TAKE 1 TABLET BY MOUTH AT BEDTIME.     Not Delegated - Analgesics:  Muscle Relaxants Failed - 08/30/2023  3:48 PM      Failed - This refill cannot be delegated      Passed - Valid encounter within last 6 months    Recent Outpatient Visits           3 weeks ago Annual physical exam   [place] [facility] at [facility], Leita DEL, MD   2 months ago Bronchitis   [facility] at [facility], Leita DEL, MD   5 months ago Essential hypertension   [facility] at [facility], Leita DEL, MD       Future Appointments             In 3 months Justus, Leita DEL, MD [facility] Primary Care & Sports Medicine at [facility], [facility]

## 2023-08-30 NOTE — Telephone Encounter (Signed)
 Requested Prescriptions  Pending Prescriptions Disp Refills   WIXELA INHUB 100-50 MCG/ACT AEPB [Pharmacy Med Name: WIXELA 100-50 INHUB] 60 each 0    Sig: INHALE 1 PUFF INTO THE LUNGS TWICE A DAY     Pulmonology:  Combination Products Passed - 08/30/2023 11:46 AM      Passed - Valid encounter within last 12 months    Recent Outpatient Visits           3 weeks ago Annual physical exam   [place] [facility] at [facility], Leita DEL, MD   2 months ago Bronchitis   [facility] at [facility], Leita DEL, MD   5 months ago Essential hypertension   [facility] at [facility], Leita DEL, MD       Future Appointments             In 3 months Justus, Leita DEL, MD [facility] Primary Care & Sports Medicine at [facility], [facility]

## 2023-08-31 ENCOUNTER — Other Ambulatory Visit: Payer: Self-pay | Admitting: Internal Medicine

## 2023-08-31 DIAGNOSIS — E118 Type 2 diabetes mellitus with unspecified complications: Secondary | ICD-10-CM

## 2023-08-31 DIAGNOSIS — M778 Other enthesopathies, not elsewhere classified: Secondary | ICD-10-CM

## 2023-08-31 DIAGNOSIS — I10 Essential (primary) hypertension: Secondary | ICD-10-CM

## 2023-09-03 NOTE — Telephone Encounter (Signed)
 Requested Prescriptions  Pending Prescriptions Disp Refills   methocarbamol  (ROBAXIN ) 500 MG tablet [Pharmacy Med Name: METHOCARBAMOL  500 MG TABLET] 30 tablet 0    Sig: TAKE 1 TABLET BY MOUTH AT BEDTIME.     Not Delegated - Analgesics:  Muscle Relaxants Failed - 09/03/2023 11:29 AM      Failed - This refill cannot be delegated      Passed - Valid encounter within last 6 months    Recent Outpatient Visits           3 weeks ago Annual physical exam   [place] [facility] at [facility], Tracy DEL, MD   3 months ago Bronchitis   [place] [facility] at [facility], Tracy DEL, MD   5 months ago Essential hypertension   [place] [facility] Medicine at [facility], Tracy DEL, MD       Future Appointments             In 3 months Tracy Tracy DEL, MD [facility] Primary Care & Sports Medicine at [facility], [facility]            Refused Prescriptions Disp Refills   metFORMIN  (GLUCOPHAGE -XR) 500 MG 24 hr tablet [Pharmacy Med Name: METFORMIN  HCL ER 500 MG TABLET] 90 tablet 0    Sig: TAKE 1 TABLET BY MOUTH EVERY DAY WITH BREAKFAST     Endocrinology:  Diabetes - Biguanides Failed - 09/03/2023 11:29 AM      Failed - B12 Level in normal range and within 720 days    No results found for: VITAMINB12       Passed - Cr in normal range and within 360 days    Creatinine  Date Value Ref Range Status  07/08/2013 0.92 0.60 - 1.30 mg/dL Final   Creatinine, Ser  Date Value Ref Range Status  08/07/2023 0.98 0.57 - 1.00 mg/dL Final         Passed - HBA1C is between 0 and 7.9 and within 180 days    HB A1C (BAYER DCA - WAIVED)  Date Value Ref Range Status  07/28/2014 6.9 <7.0 % Final    Comment:                                          Diabetic Adult            <7.0                                       Healthy Adult        4.3 - 5.7                                                            (DCCT/NGSP) American Diabetes Association's Summary of Glycemic Recommendations for Adults with Diabetes: Hemoglobin A1c <7.0%. More stringent glycemic goals (A1c <6.0%) may further reduce complications at the cost of increased risk of hypoglycemia.    Hgb A1c MFr Bld  Date Value Ref Range Status  08/07/2023 7.9 (H) 4.8 - 5.6 % Final  Comment:             Prediabetes: 5.7 - 6.4          Diabetes: >6.4          Glycemic control for adults with diabetes: <7.0          Passed - eGFR in normal range and within 360 days    EGFR (African American)  Date Value Ref Range Status  07/08/2013 >60  Final   GFR calc Af Amer  Date Value Ref Range Status  07/21/2019 89 >59 mL/min/1.73 Final    Comment:    **[facility] currently reports eGFR in compliance with the current**   recommendations of the [employer]. [facility] will   update reporting as new guidelines are published from the NKF-ASN   Task force.    EGFR (Non-African Amer.)  Date Value Ref Range Status  07/08/2013 >60  Final    Comment:    eGFR values <53mL/min/1.73 m2 may be an indication of chronic kidney disease (CKD). Calculated eGFR is useful in patients with stable renal function. The eGFR calculation will not be reliable in acutely ill patients when serum creatinine is changing rapidly. It is not useful in  patients on dialysis. The eGFR calculation may not be applicable to patients at the low and high extremes of body sizes, pregnant women, and vegetarians.    GFR, Estimated  Date Value Ref Range Status  05/06/2021 51 (L) >60 mL/min Final    Comment:    (NOTE) Calculated using the CKD-EPI Creatinine Equation (2021)    eGFR  Date Value Ref Range Status  08/07/2023 64 >59 mL/min/1.73 Final         Passed - Valid encounter within last 6 months    Recent Outpatient Visits           3 weeks ago Annual physical exam   [place] [facility] at [facility], Tracy DEL, MD   3 months ago Bronchitis   [place] [facility] Medicine at [facility], Tracy DEL, MD   5 months ago Essential hypertension    Primary Care & Sports Medicine at [facility], Tracy DEL, MD       Future Appointments             In 3 months Tracy Tracy DEL, MD [facility] Primary Care & Sports Medicine at [facility], [facility]            Passed - CBC within normal limits and completed in the last 12 months    WBC  Date Value Ref Range Status  08/07/2023 8.4 3.4 - 10.8 x10E3/uL Final  05/06/2021 9.6 4.0 - 10.5 K/uL Final   RBC  Date Value Ref Range Status  08/07/2023 4.68 3.77 - 5.28 x10E6/uL Final  05/06/2021 4.97 3.87 - 5.11 MIL/uL Final   Hemoglobin  Date Value Ref Range Status  08/07/2023 13.5 11.1 - 15.9 g/dL Final   Hematocrit  Date Value Ref Range Status  08/07/2023 42.0 34.0 - 46.6 % Final   MCHC  Date Value Ref Range Status  08/07/2023 32.1 31.5 - 35.7 g/dL Final  96/74/7976 66.9 30.0 - 36.0 g/dL Final   [facility]  Date Value Ref Range Status  08/07/2023 28.8 26.6 - 33.0 pg Final  05/06/2021 29.0 26.0 - 34.0 pg Final   MCV  Date Value Ref Range Status  08/07/2023 90 79 - 97 fL Final  09/28/2013  85 80 - 100 fL Final   No results found for: PLTCOUNTKUC, LABPLAT, POCPLA RDW  Date Value Ref Range Status  08/07/2023 13.7 11.7 - 15.4 % Final  09/28/2013 12.9 11.5 - 14.5 % Final          losartan  (COZAAR ) 100 MG tablet [Pharmacy Med Name: LOSARTAN  POTASSIUM 100 MG TAB] 90 tablet 0    Sig: TAKE 1 TABLET BY MOUTH EVERY DAY     Cardiovascular:  Angiotensin Receptor Blockers Passed - 09/03/2023 11:29 AM      Passed - Cr in normal range and within 180 days    Creatinine  Date Value Ref Range Status  07/08/2013 0.92 0.60 - 1.30 mg/dL Final   Creatinine, Ser  Date Value Ref Range Status  08/07/2023 0.98 0.57 - 1.00 mg/dL Final         Passed - K in normal range and within 180 days    Potassium   Date Value Ref Range Status  08/07/2023 5.2 3.5 - 5.2 mmol/L Final  07/08/2013 3.5 3.5 - 5.1 mmol/L Final         Passed - Patient is not pregnant      Passed - Last BP in normal range    BP Readings from Last 1 Encounters:  08/07/23 122/70         Passed - Valid encounter within last 6 months    Recent Outpatient Visits           3 weeks ago Annual physical exam   [place] [facility] at [facility], Tracy DEL, MD   3 months ago Bronchitis   [facility] at [facility], Tracy DEL, MD   5 months ago Essential hypertension   [facility] Primary Care & Sports Medicine at [facility], Tracy DEL, MD       Future Appointments             In 3 months Tracy, Tracy DEL, MD [facility] Primary Care & Sports Medicine at [facility], [facility]

## 2023-09-03 NOTE — Telephone Encounter (Signed)
 Requested medications are due for refill today.  no  Requested medications are on the active medications list.  yes  Last refill. 08/30/2023  Future visit scheduled.   yes  Notes to clinic.  Refusal not delegated.    Requested Prescriptions  Pending Prescriptions Disp Refills   methocarbamol  (ROBAXIN ) 500 MG tablet [Pharmacy Med Name: METHOCARBAMOL  500 MG TABLET] 30 tablet 0    Sig: TAKE 1 TABLET BY MOUTH AT BEDTIME.     Not Delegated - Analgesics:  Muscle Relaxants Failed - 09/03/2023 11:29 AM      Failed - This refill cannot be delegated      Passed - Valid encounter within last 6 months    Recent Outpatient Visits           3 weeks ago Annual physical exam   [place] [facility] at [facility], Tracy DEL, MD   3 months ago Bronchitis   [place] [facility] Medicine at [facility], Tracy DEL, MD   5 months ago Essential hypertension   [place] [facility] at [facility], Tracy DEL, MD       Future Appointments             In 3 months Tracy Tracy DEL, MD [facility] Primary Care & Sports Medicine at [facility], Tracy Bryan            Refused Prescriptions Disp Refills   metFORMIN  (GLUCOPHAGE -XR) 500 MG 24 hr tablet [Pharmacy Med Name: METFORMIN  HCL ER 500 MG TABLET] 90 tablet 0    Sig: TAKE 1 TABLET BY MOUTH EVERY DAY WITH BREAKFAST     Endocrinology:  Diabetes - Biguanides Failed - 09/03/2023 11:29 AM      Failed - B12 Level in normal range and within 720 days    No results found for: VITAMINB12       Passed - Cr in normal range and within 360 days    Creatinine  Date Value Ref Range Status  07/08/2013 0.92 0.60 - 1.30 mg/dL Final   Creatinine, Ser  Date Value Ref Range Status  08/07/2023 0.98 0.57 - 1.00 mg/dL Final         Passed - HBA1C is between 0 and 7.9 and within 180 days    HB A1C (BAYER DCA - WAIVED)  Date Value Ref Range Status  07/28/2014 6.9 <7.0  % Final    Comment:                                          Diabetic Adult            <7.0                                       Healthy Adult        4.3 - 5.7                                                           (DCCT/NGSP) American Diabetes Association's Summary of Glycemic Recommendations for Adults with Diabetes: Hemoglobin  A1c <7.0%. More stringent glycemic goals (A1c <6.0%) may further reduce complications at the cost of increased risk of hypoglycemia.    Hgb A1c MFr Bld  Date Value Ref Range Status  08/07/2023 7.9 (H) 4.8 - 5.6 % Final    Comment:             Prediabetes: 5.7 - 6.4          Diabetes: >6.4          Glycemic control for adults with diabetes: <7.0          Passed - eGFR in normal range and within 360 days    EGFR (African American)  Date Value Ref Range Status  07/08/2013 >60  Final   GFR calc Af Amer  Date Value Ref Range Status  07/21/2019 89 >59 mL/min/1.73 Final    Comment:    **[facility] currently reports eGFR in compliance with the current**   recommendations of the [employer]. [facility] will   update reporting as new guidelines are published from the NKF-ASN   Task force.    EGFR (Non-African Amer.)  Date Value Ref Range Status  07/08/2013 >60  Final    Comment:    eGFR values <26mL/min/1.73 m2 may be an indication of chronic kidney disease (CKD). Calculated eGFR is useful in patients with stable renal function. The eGFR calculation will not be reliable in acutely ill patients when serum creatinine is changing rapidly. It is not useful in  patients on dialysis. The eGFR calculation may not be applicable to patients at the low and high extremes of body sizes, pregnant women, and vegetarians.    GFR, Estimated  Date Value Ref Range Status  05/06/2021 51 (L) >60 mL/min Final    Comment:    (NOTE) Calculated using the CKD-EPI Creatinine Equation (2021)    eGFR  Date Value Ref Range Status  08/07/2023 64 >59  mL/min/1.73 Final         Passed - Valid encounter within last 6 months    Recent Outpatient Visits           3 weeks ago Annual physical exam   [place] [facility] at [facility], Tracy DEL, MD   3 months ago Bronchitis   [facility] Care [facility] Sports Medicine at [facility], Tracy DEL, MD   5 months ago Essential hypertension   [place] [facility] Medicine at [facility], Tracy DEL, MD       Future Appointments             In 3 months Tracy Tracy DEL, MD [facility] Primary Care & Sports Medicine at [place], [facility]            Passed - CBC within normal limits and completed in the last 12 months    WBC  Date Value Ref Range Status  08/07/2023 8.4 3.4 - 10.8 x10E3/uL Final  05/06/2021 9.6 4.0 - 10.5 K/uL Final   RBC  Date Value Ref Range Status  08/07/2023 4.68 3.77 - 5.28 x10E6/uL Final  05/06/2021 4.97 3.87 - 5.11 MIL/uL Final   Hemoglobin  Date Value Ref Range Status  08/07/2023 13.5 11.1 - 15.9 g/dL Final   Hematocrit  Date Value Ref Range Status  08/07/2023 42.0 34.0 - 46.6 % Final   MCHC  Date Value Ref Range Status  08/07/2023 32.1 31.5 - 35.7 g/dL Final  96/74/7976 66.9 30.0 - 36.0 g/dL  Final   MCH  Date Value Ref Range Status  08/07/2023 28.8 26.6 - 33.0 pg Final  05/06/2021 29.0 26.0 - 34.0 pg Final   MCV  Date Value Ref Range Status  08/07/2023 90 79 - 97 fL Final  09/28/2013 85 80 - 100 fL Final   No results found for: PLTCOUNTKUC, LABPLAT, POCPLA RDW  Date Value Ref Range Status  08/07/2023 13.7 11.7 - 15.4 % Final  09/28/2013 12.9 11.5 - 14.5 % Final          losartan  (COZAAR ) 100 MG tablet [Pharmacy Med Name: LOSARTAN  POTASSIUM 100 MG TAB] 90 tablet 0    Sig: TAKE 1 TABLET BY MOUTH EVERY DAY     Cardiovascular:  Angiotensin Receptor Blockers Passed - 09/03/2023 11:29 AM      Passed - Cr in normal range and within 180 days    Creatinine   Date Value Ref Range Status  07/08/2013 0.92 0.60 - 1.30 mg/dL Final   Creatinine, Ser  Date Value Ref Range Status  08/07/2023 0.98 0.57 - 1.00 mg/dL Final         Passed - K in normal range and within 180 days    Potassium  Date Value Ref Range Status  08/07/2023 5.2 3.5 - 5.2 mmol/L Final  07/08/2013 3.5 3.5 - 5.1 mmol/L Final         Passed - Patient is not pregnant      Passed - Last BP in normal range    BP Readings from Last 1 Encounters:  08/07/23 122/70         Passed - Valid encounter within last 6 months    Recent Outpatient Visits           3 weeks ago Annual physical exam   [place] [facility] at [facility], Tracy DEL, MD   3 months ago Bronchitis   [facility] at [facility], Tracy DEL, MD   5 months ago Essential hypertension   [facility] Primary Care & Sports Medicine at [facility], Tracy DEL, MD       Future Appointments             In 3 months Tracy, Tracy DEL, MD [facility] Primary Care & Sports Medicine at [facility], [facility]

## 2023-09-21 ENCOUNTER — Other Ambulatory Visit: Payer: Self-pay | Admitting: Internal Medicine

## 2023-09-21 DIAGNOSIS — J4520 Mild intermittent asthma, uncomplicated: Secondary | ICD-10-CM

## 2023-09-21 DIAGNOSIS — E1169 Type 2 diabetes mellitus with other specified complication: Secondary | ICD-10-CM

## 2023-09-21 DIAGNOSIS — I10 Essential (primary) hypertension: Secondary | ICD-10-CM

## 2023-09-21 DIAGNOSIS — F39 Unspecified mood [affective] disorder: Secondary | ICD-10-CM

## 2023-09-25 NOTE — Telephone Encounter (Signed)
 Requested Prescriptions  Pending Prescriptions Disp Refills   WIXELA INHUB 100-50 MCG/ACT AEPB [Pharmacy Med Name: WIXELA 100-50 INHUB] 60 each 0    Sig: INHALE 1 PUFF INTO THE LUNGS TWICE A DAY     Pulmonology:  Combination Products Passed - 09/25/2023 10:54 AM      Passed - Valid encounter within last 12 months    Recent Outpatient Visits           1 month ago Annual physical exam   [place] [facility] Medicine at [facility], Tracy DEL, MD   3 months ago Bronchitis   [place] [facility] Care [facility] Sports Medicine at [facility], Tracy DEL, MD   6 months ago Essential hypertension   [place] [facility] Care & Sports Medicine at [facility], Tracy DEL, MD       Future Appointments             In 2 months Tracy Tracy DEL, MD [facility] Primary Care & Sports Medicine at [facility], [facility]            Refused Prescriptions Disp Refills   DULoxetine (CYMBALTA) 60 MG capsule [Pharmacy Med Name: DULOXETINE HCL DR 60 MG CAP] 90 capsule     Sig: TAKE 1 CAPSULE BY MOUTH EVERY DAY     Psychiatry: Antidepressants - SNRI - duloxetine Passed - 09/25/2023 10:54 AM      Passed - Cr in normal range and within 360 days    Creatinine  Date Value Ref Range Status  07/08/2013 0.92 0.60 - 1.30 mg/dL Final   Creatinine, Ser  Date Value Ref Range Status  08/07/2023 0.98 0.57 - 1.00 mg/dL Final         Passed - eGFR is 30 or above and within 360 days    EGFR (African American)  Date Value Ref Range Status  07/08/2013 >60  Final   GFR calc Af Amer  Date Value Ref Range Status  07/21/2019 89 >59 mL/min/1.73 Final    Comment:    **[facility] currently reports eGFR in compliance with the current**   recommendations of the [employer]. [facility] will   update reporting as new guidelines are published from the NKF-ASN   Task force.    EGFR (Non-African Amer.)  Date Value Ref Range Status  07/08/2013 >60  Final    Comment:     eGFR values <58mL/min/1.73 m2 may be an indication of chronic kidney disease (CKD). Calculated eGFR is useful in patients with stable renal function. The eGFR calculation will not be reliable in acutely ill patients when serum creatinine is changing rapidly. It is not useful in  patients on dialysis. The eGFR calculation may not be applicable to patients at the low and high extremes of body sizes, pregnant women, and vegetarians.    GFR, Estimated  Date Value Ref Range Status  05/06/2021 51 (L) >60 mL/min Final    Comment:    (NOTE) Calculated using the CKD-EPI Creatinine Equation (2021)    eGFR  Date Value Ref Range Status  08/07/2023 64 >59 mL/min/1.73 Final         Passed - Completed PHQ-2 or PHQ-9 in the last 360 days      Passed - Last BP in normal range    BP Readings from Last 1 Encounters:  08/07/23 122/70         Passed - Valid encounter within last 6 months    Recent Outpatient Visits  1 month ago Annual physical exam   [place] [facility] Care [facility] Medicine at [facility], Tracy DEL, MD   3 months ago Bronchitis   [facility] Sports Medicine at [facility], Tracy DEL, MD   6 months ago Essential hypertension   [place] [facility] & Sports Medicine at [facility], Tracy DEL, MD       Future Appointments             In 2 months Tracy Tracy DEL, MD [facility] Primary Care & Sports Medicine at [facility], [facility]             atorvastatin (LIPITOR) 40 MG tablet [Pharmacy Med Name: ATORVASTATIN 40 MG TABLET] 90 tablet 1    Sig: TAKE 1 TABLET BY MOUTH EVERY DAY     Cardiovascular:  Antilipid - Statins Failed - 09/25/2023 10:54 AM      Failed - Lipid Panel in normal range within the last 12 months    Cholesterol, Total  Date Value Ref Range Status  08/07/2023 192 100 - 199 mg/dL Final   Cholesterol Piccolo, Waived  Date Value Ref Range Status  07/28/2014 CANCELED mg/dL      Comment:    No green top heparin tube submitted.                         Desirable                <200                         Borderline High      200- 239                         High                     >239  Result canceled by the ancillary    LDL Chol Calc (NIH)  Date Value Ref Range Status  08/07/2023 72 0 - 99 mg/dL Final   HDL  Date Value Ref Range Status  08/07/2023 51 >39 mg/dL Final   Triglycerides  Date Value Ref Range Status  08/07/2023 441 (H) 0 - 149 mg/dL Final   Triglycerides Piccolo,Waived  Date Value Ref Range Status  07/28/2014 CANCELED      Comment:    Test not performed  Result canceled by the ancillary          Passed - Patient is not pregnant      Passed - Valid encounter within last 12 months    Recent Outpatient Visits           1 month ago Annual physical exam   [place] [facility] at [facility], Tracy DEL, MD   3 months ago Bronchitis   [facility] Primary Care & Sports Medicine at [facility], Tracy DEL, MD   6 months ago Essential hypertension   [place] [facility] Medicine at [facility], Tracy DEL, MD       Future Appointments             In 2 months Tracy Tracy DEL, MD [facility] Primary Care & Sports Medicine at [facility], [facility]             losartan (COZAAR) 100 MG tablet [  Pharmacy Med Name: LOSARTAN  POTASSIUM 100 MG TAB] 90 tablet 0    Sig: TAKE 1 TABLET BY MOUTH EVERY DAY     Cardiovascular:  Angiotensin Receptor Blockers Passed - 09/25/2023 10:54 AM      Passed - Cr in normal range and within 180 days    Creatinine  Date Value Ref Range Status  07/08/2013 0.92 0.60 - 1.30 mg/dL Final   Creatinine, Ser  Date Value Ref Range Status  08/07/2023 0.98 0.57 - 1.00 mg/dL Final         Passed - K in normal range and within 180 days    Potassium  Date Value Ref Range Status  08/07/2023 5.2 3.5 - 5.2 mmol/L Final  07/08/2013 3.5 3.5 -  5.1 mmol/L Final         Passed - Patient is not pregnant      Passed - Last BP in normal range    BP Readings from Last 1 Encounters:  08/07/23 122/70         Passed - Valid encounter within last 6 months    Recent Outpatient Visits           1 month ago Annual physical exam   [place] [facility] Medicine at [facility], Tracy DEL, MD   3 months ago Bronchitis   [facility] Primary Care & Sports Medicine at [facility], Tracy DEL, MD   6 months ago Essential hypertension   [facility] Primary Care & Sports Medicine at [facility], Tracy DEL, MD       Future Appointments             In 2 months Tracy, Tracy DEL, MD [facility] Primary Care & Sports Medicine at [facility], [facility]

## 2023-09-28 ENCOUNTER — Other Ambulatory Visit: Payer: Self-pay | Admitting: Internal Medicine

## 2023-09-28 DIAGNOSIS — M778 Other enthesopathies, not elsewhere classified: Secondary | ICD-10-CM

## 2023-10-01 ENCOUNTER — Other Ambulatory Visit: Payer: Self-pay

## 2023-10-01 NOTE — Telephone Encounter (Signed)
 Requested medication (s) are due for refill today - yes  Requested medication (s) are on the active medication list -yes  Future visit scheduled -yes  Last refill: 08/30/23 #30  Notes to clinic: non delegated Rx  Requested Prescriptions  Pending Prescriptions Disp Refills   methocarbamol  (ROBAXIN ) 500 MG tablet [Pharmacy Med Name: METHOCARBAMOL  500 MG TABLET] 30 tablet 0    Sig: TAKE 1 TABLET BY MOUTH AT BEDTIME.     Not Delegated - Analgesics:  Muscle Relaxants Failed - 10/01/2023  2:58 PM      Failed - This refill cannot be delegated      Passed - Valid encounter within last 6 months    Recent Outpatient Visits           1 month ago Annual physical exam   [place] [facility] at [facility], Leita DEL, MD   4 months ago Bronchitis   [place] [facility] at [employer], Leita DEL, MD   6 months ago Essential hypertension   [place] [facility] at [facility], Leita DEL, MD       Future Appointments             In 2 months Justus Leita DEL, MD [facility] Health Primary Care & Sports Medicine at [facility], [facility]               Requested Prescriptions  Pending Prescriptions Disp Refills   methocarbamol  (ROBAXIN ) 500 MG tablet [Pharmacy Med Name: METHOCARBAMOL  500 MG TABLET] 30 tablet 0    Sig: TAKE 1 TABLET BY MOUTH AT BEDTIME.     Not Delegated - Analgesics:  Muscle Relaxants Failed - 10/01/2023  2:58 PM      Failed - This refill cannot be delegated      Passed - Valid encounter within last 6 months    Recent Outpatient Visits           1 month ago Annual physical exam   [place] [facility] at [facility], Leita DEL, MD   4 months ago Bronchitis   [facility] at [facility], Leita DEL, MD   6 months ago Essential hypertension   [facility] Primary [facility] & Sports Medicine at [facility], Leita DEL, MD       Future Appointments             In 2 months Justus, Leita DEL, MD [facility] at [facility], [facility]

## 2023-10-23 ENCOUNTER — Other Ambulatory Visit: Payer: Self-pay | Admitting: Internal Medicine

## 2023-10-23 DIAGNOSIS — I10 Essential (primary) hypertension: Secondary | ICD-10-CM

## 2023-10-23 DIAGNOSIS — F5101 Primary insomnia: Secondary | ICD-10-CM

## 2023-10-23 DIAGNOSIS — E118 Type 2 diabetes mellitus with unspecified complications: Secondary | ICD-10-CM

## 2023-10-23 DIAGNOSIS — E1169 Type 2 diabetes mellitus with other specified complication: Secondary | ICD-10-CM

## 2023-10-23 DIAGNOSIS — M778 Other enthesopathies, not elsewhere classified: Secondary | ICD-10-CM

## 2023-10-23 DIAGNOSIS — J4520 Mild intermittent asthma, uncomplicated: Secondary | ICD-10-CM

## 2023-10-23 DIAGNOSIS — F39 Unspecified mood [affective] disorder: Secondary | ICD-10-CM

## 2023-10-24 NOTE — Telephone Encounter (Signed)
 Requested medication (s) are due for refill today: yes  Requested medication (s) are on the active medication list: yes  Last refill:  10/01/23  Future visit scheduled: yes  Notes to clinic:  med not delegated to NT to reorder or refuse   Requested Prescriptions  Pending Prescriptions Disp Refills   methocarbamol  (ROBAXIN ) 500 MG tablet [Pharmacy Med Name: METHOCARBAMOL  500 MG TABLET] 30 tablet     Sig: TAKE 1 TABLET BY MOUTH AT BEDTIME.     Not Delegated - Analgesics:  Muscle Relaxants Failed - 10/24/2023 11:16 AM      Failed - This refill cannot be delegated      Passed - Valid encounter within last 6 months    Recent Outpatient Visits           2 months ago Annual physical exam   [place] [facility] at [facility], Leita DEL, MD   4 months ago Bronchitis   [place] [facility] at [facility], Leita DEL, MD   7 months ago Essential hypertension   [place] [facility] at [facility], Leita DEL, MD       Future Appointments             In 1 month Justus Leita DEL, MD [facility] at [facility], [phone]            Signed Prescriptions Disp Refills   WIXELA INHUB 100-50 MCG/ACT AEPB 60 each 0    Sig: INHALE 1 PUFF INTO THE LUNGS TWICE A DAY     Pulmonology:  Combination Products Passed - 10/24/2023 11:16 AM      Passed - Valid encounter within last 12 months    Recent Outpatient Visits           2 months ago Annual physical exam   [place] [facility] at [facility], Leita DEL, MD   4 months ago Bronchitis   [place] [facility] at [facility], Leita DEL, MD   7 months ago Essential hypertension   [place] [facility] at [facility], Leita DEL, MD       Future Appointments             In 1 month Justus Leita DEL,  MD [facility] Medicine at [facility], [phone]             metoprolol  succinate (TOPROL -XL) 100 MG 24 hr tablet 90 tablet 0    Sig: TAKE 0.5 TABLETS (50 MG TOTAL) BY MOUTH IN THE MORNING AND AT BEDTIME.     Cardiovascular:  Beta Blockers Passed - 10/24/2023 11:16 AM      Passed - Last BP in normal range    BP Readings from Last 1 Encounters:  08/07/23 122/70         Passed - Last Heart Rate in normal range    Pulse Readings from Last 1 Encounters:  08/07/23 83         Passed - Valid encounter within last 6 months    Recent Outpatient Visits           2 months ago Annual physical exam   [facility] at [facility], Leita DEL, MD   4 months ago Bronchitis   [facility] at [facility], Leita DEL, MD   7 months ago Essential hypertension   [place] [facility] at [facility], Leita DEL, MD       Future Appointments             In 1 month Justus Leita DEL, MD [facility] Primary Care & Sports Medicine at [facility], [phone] [address]             metFORMIN  (GLUCOPHAGE -XR) 500 MG 24 hr tablet 90 tablet 0    Sig: TAKE 1 TABLET BY MOUTH EVERY DAY WITH BREAKFAST     Endocrinology:  Diabetes - Biguanides Failed - 10/24/2023 11:16 AM      Failed - B12 Level in normal range and within 720 days    No results found for: VITAMINB12       Passed - Cr in normal range and within 360 days    Creatinine  Date Value Ref Range Status  07/08/2013 0.92 0.60 - 1.30 mg/dL Final   Creatinine, Ser  Date Value Ref Range Status  08/07/2023 0.98 0.57 - 1.00 mg/dL Final         Passed - HBA1C is between 0 and 7.9 and within 180 days    HB A1C (BAYER DCA - WAIVED)  Date Value Ref Range Status  07/28/2014 6.9 <7.0 % Final    Comment:                                          Diabetic Adult            <7.0                                        Healthy Adult        4.3 - 5.7                                                           (DCCT/NGSP) American Diabetes Association's Summary of Glycemic Recommendations for Adults with Diabetes: Hemoglobin A1c <7.0%. More stringent glycemic goals (A1c <6.0%) may further reduce complications at the cost of increased risk of hypoglycemia.    Hgb A1c MFr Bld  Date Value Ref Range Status  08/07/2023 7.9 (H) 4.8 - 5.6 % Final    Comment:             Prediabetes: 5.7 - 6.4          Diabetes: >6.4          Glycemic control for adults with diabetes: <7.0          Passed - eGFR in normal range and within 360 days    EGFR (African American)  Date Value Ref Range Status  07/08/2013 >60  Final   GFR calc Af Amer  Date Value Ref Range Status  07/21/2019 89 >59 mL/min/1.73 Final    Comment:    **[facility] currently reports eGFR in compliance with the current**   recommendations of the [employer]. [facility] will   update reporting as new guidelines are published  from the NKF-ASN   Task force.    EGFR (Non-African Amer.)  Date Value Ref Range Status  07/08/2013 >60  Final    Comment:    eGFR values <25mL/min/1.73 m2 may be an indication of chronic kidney disease (CKD). Calculated eGFR is useful in patients with stable renal function. The eGFR calculation will not be reliable in acutely ill patients when serum creatinine is changing rapidly. It is not useful in  patients on dialysis. The eGFR calculation may not be applicable to patients at the low and high extremes of body sizes, pregnant women, and vegetarians.    GFR, Estimated  Date Value Ref Range Status  05/06/2021 51 (L) >60 mL/min Final    Comment:    (NOTE) Calculated using the CKD-EPI Creatinine Equation (2021)    eGFR  Date Value Ref Range Status  08/07/2023 64 >59 mL/min/1.73 Final         Passed - Valid encounter within last 6 months    Recent Outpatient Visits           2 months ago  Annual physical exam   [place] [facility] at [facility], Leita DEL, MD   4 months ago Bronchitis   [place] [facility] at [facility], Leita DEL, MD   7 months ago Essential hypertension   [place] [facility] at [facility], Leita DEL, MD       Future Appointments             In 1 month Justus Leita DEL, MD [facility] Primary Care & Sports Medicine at [facility], [phone] [address]            Passed - CBC within normal limits and completed in the last 12 months    WBC  Date Value Ref Range Status  08/07/2023 8.4 3.4 - 10.8 x10E3/uL Final  05/06/2021 9.6 4.0 - 10.5 K/uL Final   RBC  Date Value Ref Range Status  08/07/2023 4.68 3.77 - 5.28 x10E6/uL Final  05/06/2021 4.97 3.87 - 5.11 MIL/uL Final   Hemoglobin  Date Value Ref Range Status  08/07/2023 13.5 11.1 - 15.9 g/dL Final   Hematocrit  Date Value Ref Range Status  08/07/2023 42.0 34.0 - 46.6 % Final   MCHC  Date Value Ref Range Status  08/07/2023 32.1 31.5 - 35.7 g/dL Final  96/74/7976 66.9 30.0 - 36.0 g/dL Final   [facility]  Date Value Ref Range Status  08/07/2023 28.8 26.6 - 33.0 pg Final  05/06/2021 29.0 26.0 - 34.0 pg Final   MCV  Date Value Ref Range Status  08/07/2023 90 79 - 97 fL Final  09/28/2013 85 80 - 100 fL Final   No results found for: PLTCOUNTKUC, LABPLAT, POCPLA RDW  Date Value Ref Range Status  08/07/2023 13.7 11.7 - 15.4 % Final  09/28/2013 12.9 11.5 - 14.5 % Final          DULoxetine  (CYMBALTA ) 60 MG capsule 180 capsule 0    Sig: TAKE 1 CAPSULE BY MOUTH TWICE A DAY     Psychiatry: Antidepressants - SNRI - duloxetine  Passed - 10/24/2023 11:16 AM      Passed - Cr in normal range and within 360 days    Creatinine  Date Value Ref Range Status  07/08/2013 0.92 0.60 - 1.30 mg/dL Final   Creatinine, Ser  Date Value Ref Range Status  08/07/2023 0.98 0.57 - 1.00 mg/dL  Final          Passed - eGFR is 30 or above and within 360 days    EGFR (African American)  Date Value Ref Range Status  07/08/2013 >60  Final   GFR calc Af Amer  Date Value Ref Range Status  07/21/2019 89 >59 mL/min/1.73 Final    Comment:    **[facility] currently reports eGFR in compliance with the current**   recommendations of the [employer]. [facility] will   update reporting as new guidelines are published from the NKF-ASN   Task force.    EGFR (Non-African Amer.)  Date Value Ref Range Status  07/08/2013 >60  Final    Comment:    eGFR values <24mL/min/1.73 m2 may be an indication of chronic kidney disease (CKD). Calculated eGFR is useful in patients with stable renal function. The eGFR calculation will not be reliable in acutely ill patients when serum creatinine is changing rapidly. It is not useful in  patients on dialysis. The eGFR calculation may not be applicable to patients at the low and high extremes of body sizes, pregnant women, and vegetarians.    GFR, Estimated  Date Value Ref Range Status  05/06/2021 51 (L) >60 mL/min Final    Comment:    (NOTE) Calculated using the CKD-EPI Creatinine Equation (2021)    eGFR  Date Value Ref Range Status  08/07/2023 64 >59 mL/min/1.73 Final         Passed - Completed PHQ-2 or PHQ-9 in the last 360 days      Passed - Last BP in normal range    BP Readings from Last 1 Encounters:  08/07/23 122/70         Passed - Valid encounter within last 6 months    Recent Outpatient Visits           2 months ago Annual physical exam   [place] [facility], Leita DEL, MD   4 months ago Bronchitis   [place] [facility] at [facility], Leita DEL, MD   7 months ago Essential hypertension   [place] [facility] at [facility], Leita DEL, MD       Future Appointments             In 1 month Justus  Leita DEL, MD [facility] Health Primary Care & Sports Medicine at [facility], [phone]             traZODone  (DESYREL ) 50 MG tablet 30 tablet 0    Sig: TAKE 0.5-1 TABLETS BY MOUTH AT BEDTIME AS NEEDED FOR SLEEP.     Psychiatry: Antidepressants - Serotonin Modulator Passed - 10/24/2023 11:16 AM      Passed - Completed PHQ-2 or PHQ-9 in the last 360 days      Passed - Valid encounter within last 6 months    Recent Outpatient Visits           2 months ago Annual physical exam   [facility] at [facility], Leita DEL, MD   4 months ago Bronchitis   [facility] Primary Care & Sports Medicine at [facility], Leita DEL, MD   7 months ago Essential hypertension   [facility] Primary [facility] & [facility] at [facility], Leita DEL, MD       Future Appointments  In 1 month Justus Leita DEL, MD [facility] Health Primary Care & Sports Medicine at [facility], [phone] [address]             atorvastatin  (LIPITOR) 40 MG tablet 90 tablet 1    Sig: TAKE 1 TABLET BY MOUTH EVERY DAY     Cardiovascular:  Antilipid - Statins Failed - 10/24/2023 11:16 AM      Failed - Lipid Panel in normal range within the last 12 months    Cholesterol, Total  Date Value Ref Range Status  08/07/2023 192 100 - 199 mg/dL Final   Cholesterol Piccolo, Waived  Date Value Ref Range Status  07/28/2014 CANCELED mg/dL     Comment:    No green top heparin tube submitted.                         Desirable                <200                         Borderline High      200- 239                         High                     >239  Result canceled by the ancillary    LDL Chol Calc (NIH)  Date Value Ref Range Status  08/07/2023 72 0 - 99 mg/dL Final   HDL  Date Value Ref Range Status  08/07/2023 51 >39 mg/dL Final   Triglycerides  Date Value Ref Range Status  08/07/2023 441 (H) 0 - 149 mg/dL Final   Triglycerides  Piccolo,Waived  Date Value Ref Range Status  07/28/2014 CANCELED      Comment:    Test not performed  Result canceled by the ancillary          Passed - Patient is not pregnant      Passed - Valid encounter within last 12 months    Recent Outpatient Visits           2 months ago Annual physical exam   [place] [facility] at [facility], Leita DEL, MD   4 months ago Bronchitis   [facility] Medicine at [facility], Leita DEL, MD   7 months ago Essential hypertension   [place] [facility] at [facility], Leita DEL, MD       Future Appointments             In 1 month Justus Leita DEL, MD [facility] Health Primary Care & Sports Medicine at [facility], [phone]             losartan  (COZAAR ) 100 MG tablet 90 tablet 0    Sig: TAKE 1 TABLET BY MOUTH EVERY DAY     Cardiovascular:  Angiotensin Receptor Blockers Passed - 10/24/2023 11:16 AM      Passed - Cr in normal range and within 180 days    Creatinine  Date Value Ref Range Status  07/08/2013 0.92 0.60 - 1.30 mg/dL Final   Creatinine, Ser  Date Value Ref Range Status  08/07/2023 0.98 0.57 - 1.00 mg/dL Final         Passed - K in normal  range and within 180 days    Potassium  Date Value Ref Range Status  08/07/2023 5.2 3.5 - 5.2 mmol/L Final  07/08/2013 3.5 3.5 - 5.1 mmol/L Final         Passed - Patient is not pregnant      Passed - Last BP in normal range    BP Readings from Last 1 Encounters:  08/07/23 122/70         Passed - Valid encounter within last 6 months    Recent Outpatient Visits           2 months ago Annual physical exam   [place] [facility] at [facility], Leita DEL, MD   4 months ago Bronchitis   [facility] at [facility], Leita DEL, MD   7 months ago Essential hypertension   [place]  & Sports Medicine at [facility], Leita DEL, MD       Future Appointments             In 1 month Justus, Leita DEL, MD [facility] Health Primary Care & Sports Medicine at [facility], [phone]

## 2023-10-24 NOTE — Telephone Encounter (Signed)
 Requested Prescriptions  Pending Prescriptions Disp Refills   WIXELA INHUB 100-50 MCG/ACT AEPB [Pharmacy Med Name: WIXELA 100-50 INHUB] 60 each 0    Sig: INHALE 1 PUFF INTO THE LUNGS TWICE A DAY     Pulmonology:  Combination Products Passed - 10/24/2023 11:15 AM      Passed - Valid encounter within last 12 months    Recent Outpatient Visits           2 months ago Annual physical exam   [place] [facility] at [facility], Leita DEL, MD   4 months ago Bronchitis   [place] [facility] at [facility], Leita DEL, MD   7 months ago Essential hypertension   [place] [facility] at [facility], Leita DEL, MD       Future Appointments             In 1 month Justus Leita DEL, MD [facility] Primary Care & Sports Medicine at [facility], [phone] [place]             metoprolol  succinate (TOPROL -XL) 100 MG 24 hr tablet [Pharmacy Med Name: METOPROLOL  SUCC ER 100 MG TAB] 90 tablet 0    Sig: TAKE 0.5 TABLETS (50 MG TOTAL) BY MOUTH IN THE MORNING AND AT BEDTIME.     Cardiovascular:  Beta Blockers Passed - 10/24/2023 11:15 AM      Passed - Last BP in normal range    BP Readings from Last 1 Encounters:  08/07/23 122/70         Passed - Last Heart Rate in normal range    Pulse Readings from Last 1 Encounters:  08/07/23 83         Passed - Valid encounter within last 6 months    Recent Outpatient Visits           2 months ago Annual physical exam   [place] [facility] at [facility], Leita DEL, MD   4 months ago Bronchitis   [place] [facility] at [facility], Leita DEL, MD   7 months ago Essential hypertension   [place] [facility] at [facility], Leita DEL, MD       Future Appointments             In 1 month Justus Leita DEL, MD [facility] Primary Care & Sports  Medicine at [facility], [phone] [place]             metFORMIN  (GLUCOPHAGE -XR) 500 MG 24 hr tablet [Pharmacy Med Name: METFORMIN  HCL ER 500 MG TABLET] 90 tablet 0    Sig: TAKE 1 TABLET BY MOUTH EVERY DAY WITH BREAKFAST     Endocrinology:  Diabetes - Biguanides Failed - 10/24/2023 11:15 AM      Failed - B12 Level in normal range and within 720 days    No results found for: VITAMINB12       Passed - Cr in normal range and within 360 days    Creatinine  Date Value Ref Range Status  07/08/2013 0.92 0.60 - 1.30 mg/dL Final   Creatinine, Ser  Date Value Ref Range Status  08/07/2023 0.98 0.57 - 1.00 mg/dL Final         Passed - HBA1C is between 0 and 7.9 and within 180 days    HB A1C (BAYER DCA -  WAIVED)  Date Value Ref Range Status  07/28/2014 6.9 <7.0 % Final    Comment:                                          Diabetic Adult            <7.0                                       Healthy Adult        4.3 - 5.7                                                           (DCCT/NGSP) American Diabetes Association's Summary of Glycemic Recommendations for Adults with Diabetes: Hemoglobin A1c <7.0%. More stringent glycemic goals (A1c <6.0%) may further reduce complications at the cost of increased risk of hypoglycemia.    Hgb A1c MFr Bld  Date Value Ref Range Status  08/07/2023 7.9 (H) 4.8 - 5.6 % Final    Comment:             Prediabetes: 5.7 - 6.4          Diabetes: >6.4          Glycemic control for adults with diabetes: <7.0          Passed - eGFR in normal range and within 360 days    EGFR (African American)  Date Value Ref Range Status  07/08/2013 >60  Final   GFR calc Af Amer  Date Value Ref Range Status  07/21/2019 89 >59 mL/min/1.73 Final    Comment:    **[facility] currently reports eGFR in compliance with the current**   recommendations of the [employer]. [facility] will   update reporting as new guidelines are published from the NKF-ASN   Task  force.    EGFR (Non-African Amer.)  Date Value Ref Range Status  07/08/2013 >60  Final    Comment:    eGFR values <68mL/min/1.73 m2 may be an indication of chronic kidney disease (CKD). Calculated eGFR is useful in patients with stable renal function. The eGFR calculation will not be reliable in acutely ill patients when serum creatinine is changing rapidly. It is not useful in  patients on dialysis. The eGFR calculation may not be applicable to patients at the low and high extremes of body sizes, pregnant women, and vegetarians.    GFR, Estimated  Date Value Ref Range Status  05/06/2021 51 (L) >60 mL/min Final    Comment:    (NOTE) Calculated using the CKD-EPI Creatinine Equation (2021)    eGFR  Date Value Ref Range Status  08/07/2023 64 >59 mL/min/1.73 Final         Passed - Valid encounter within last 6 months    Recent Outpatient Visits           2 months ago Annual physical exam   [facility] at [facility], Leita DEL, MD   4 months ago Bronchitis   [facility] Primary Care & Sports Medicine at [facility], Leita DEL, MD  7 months ago Essential hypertension   [place] [facility] at [facility], Leita DEL, MD       Future Appointments             In 1 month Justus Leita DEL, MD [facility] Sports Medicine at [facility], [phone] [place]            Passed - CBC within normal limits and completed in the last 12 months    WBC  Date Value Ref Range Status  08/07/2023 8.4 3.4 - 10.8 x10E3/uL Final  05/06/2021 9.6 4.0 - 10.5 K/uL Final   RBC  Date Value Ref Range Status  08/07/2023 4.68 3.77 - 5.28 x10E6/uL Final  05/06/2021 4.97 3.87 - 5.11 MIL/uL Final   Hemoglobin  Date Value Ref Range Status  08/07/2023 13.5 11.1 - 15.9 g/dL Final   Hematocrit  Date Value Ref Range Status  08/07/2023 42.0 34.0 - 46.6 % Final   MCHC  Date Value Ref Range  Status  08/07/2023 32.1 31.5 - 35.7 g/dL Final  96/74/7976 66.9 30.0 - 36.0 g/dL Final   [facility]  Date Value Ref Range Status  08/07/2023 28.8 26.6 - 33.0 pg Final  05/06/2021 29.0 26.0 - 34.0 pg Final   MCV  Date Value Ref Range Status  08/07/2023 90 79 - 97 fL Final  09/28/2013 85 80 - 100 fL Final   No results found for: PLTCOUNTKUC, LABPLAT, POCPLA RDW  Date Value Ref Range Status  08/07/2023 13.7 11.7 - 15.4 % Final  09/28/2013 12.9 11.5 - 14.5 % Final          methocarbamol  (ROBAXIN ) 500 MG tablet [Pharmacy Med Name: METHOCARBAMOL  500 MG TABLET] 30 tablet     Sig: TAKE 1 TABLET BY MOUTH AT BEDTIME.     Not Delegated - Analgesics:  Muscle Relaxants Failed - 10/24/2023 11:15 AM      Failed - This refill cannot be delegated      Passed - Valid encounter within last 6 months    Recent Outpatient Visits           2 months ago Annual physical exam   [place] [facility] at [facility], Leita DEL, MD   4 months ago Bronchitis   [facility] at [facility], Leita DEL, MD   7 months ago Essential hypertension    [facility] Medicine at [facility], Leita DEL, MD       Future Appointments             In 1 month Justus, Leita DEL, MD [facility] Health Primary Care & Sports Medicine at [facility], [phone] [place]             DULoxetine  (CYMBALTA ) 60 MG capsule [Pharmacy Med Name: DULOXETINE  HCL DR 60 MG CAP] 180 capsule 0    Sig: TAKE 1 CAPSULE BY MOUTH TWICE A DAY     Psychiatry: Antidepressants - SNRI - duloxetine  Passed - 10/24/2023 11:15 AM      Passed - Cr in normal range and within 360 days    Creatinine  Date Value Ref Range Status  07/08/2013 0.92 0.60 - 1.30 mg/dL Final   Creatinine, Ser  Date Value Ref Range Status  08/07/2023 0.98 0.57 - 1.00 mg/dL Final         Passed - eGFR is 30 or above and within 360  days    EGFR (African American)   Date Value Ref Range Status  07/08/2013 >60  Final   GFR calc Af Amer  Date Value Ref Range Status  07/21/2019 89 >59 mL/min/1.73 Final    Comment:    **[facility] currently reports eGFR in compliance with the current**   recommendations of the [employer]. [facility] will   update reporting as new guidelines are published from the NKF-ASN   Task force.    EGFR (Non-African Amer.)  Date Value Ref Range Status  07/08/2013 >60  Final    Comment:    eGFR values <33mL/min/1.73 m2 may be an indication of chronic kidney disease (CKD). Calculated eGFR is useful in patients with stable renal function. The eGFR calculation will not be reliable in acutely ill patients when serum creatinine is changing rapidly. It is not useful in  patients on dialysis. The eGFR calculation may not be applicable to patients at the low and high extremes of body sizes, pregnant women, and vegetarians.    GFR, Estimated  Date Value Ref Range Status  05/06/2021 51 (L) >60 mL/min Final    Comment:    (NOTE) Calculated using the CKD-EPI Creatinine Equation (2021)    eGFR  Date Value Ref Range Status  08/07/2023 64 >59 mL/min/1.73 Final         Passed - Completed PHQ-2 or PHQ-9 in the last 360 days      Passed - Last BP in normal range    BP Readings from Last 1 Encounters:  08/07/23 122/70         Passed - Valid encounter within last 6 months    Recent Outpatient Visits           2 months ago Annual physical exam   [place] [facility] at [facility], Leita DEL, MD   4 months ago Bronchitis   [place] [facility] at [facility], Leita DEL, MD   7 months ago Essential hypertension   [place] [facility] at [facility], Leita DEL, MD       Future Appointments             In 1 month Justus Leita DEL, MD [facility] Health Primary Care & Sports Medicine at [facility], [phone] [place]              traZODone  (DESYREL ) 50 MG tablet [Pharmacy Med Name: TRAZODONE  50 MG TABLET] 30 tablet 0    Sig: TAKE 0.5-1 TABLETS BY MOUTH AT BEDTIME AS NEEDED FOR SLEEP.     Psychiatry: Antidepressants - Serotonin Modulator Passed - 10/24/2023 11:15 AM      Passed - Completed PHQ-2 or PHQ-9 in the last 360 days      Passed - Valid encounter within last 6 months    Recent Outpatient Visits           2 months ago Annual physical exam   [facility] Care [facility] Sports Medicine at [facility], Leita DEL, MD   4 months ago Bronchitis   [facility] at [facility], Leita DEL, MD   7 months ago Essential hypertension   [facility] at [facility], Leita DEL, MD       Future Appointments             In 1 month Justus Leita DEL, MD  [facility] Primary Care & Sports Medicine at [facility], [phone] [place]             atorvastatin  (LIPITOR) 40 MG tablet [Pharmacy Med Name: ATORVASTATIN  40 MG TABLET] 90 tablet 1    Sig: TAKE 1 TABLET BY MOUTH EVERY DAY     Cardiovascular:  Antilipid - Statins Failed - 10/24/2023 11:15 AM      Failed - Lipid Panel in normal range within the last 12 months    Cholesterol, Total  Date Value Ref Range Status  08/07/2023 192 100 - 199 mg/dL Final   Cholesterol Piccolo, Waived  Date Value Ref Range Status  07/28/2014 CANCELED mg/dL     Comment:    No green top heparin tube submitted.                         Desirable                <200                         Borderline High      200- 239                         High                     >239  Result canceled by the ancillary    LDL Chol Calc (NIH)  Date Value Ref Range Status  08/07/2023 72 0 - 99 mg/dL Final   HDL  Date Value Ref Range Status  08/07/2023 51 >39 mg/dL Final   Triglycerides  Date Value Ref Range Status  08/07/2023 441 (H) 0 - 149 mg/dL Final   Triglycerides  Piccolo,Waived  Date Value Ref Range Status  07/28/2014 CANCELED      Comment:    Test not performed  Result canceled by the ancillary          Passed - Patient is not pregnant      Passed - Valid encounter within last 12 months    Recent Outpatient Visits           2 months ago Annual physical exam   [place] [facility] at [facility], Leita DEL, MD   4 months ago Bronchitis   [place] [facility] Medicine at [facility], Leita DEL, MD   7 months ago Essential hypertension   [employer] [facility] & [facility] Medicine at [facility], Leita DEL, MD       Future Appointments             In 1 month Justus, Leita DEL, MD [facility] Primary Care & Sports Medicine at [facility], [phone] [place]             losartan  (COZAAR ) 100 MG tablet [Pharmacy Med Name: LOSARTAN  POTASSIUM 100 MG TAB] 90 tablet 0    Sig: TAKE 1 TABLET BY MOUTH EVERY DAY     Cardiovascular:  Angiotensin Receptor Blockers Passed - 10/24/2023 11:15 AM      Passed - Cr in normal range and within 180 days    Creatinine  Date Value Ref Range Status  07/08/2013 0.92 0.60 - 1.30 mg/dL Final   Creatinine, Ser  Date Value Ref Range Status  08/07/2023 0.98 0.57 - 1.00 mg/dL Final  Passed - K in normal range and within 180 days    Potassium  Date Value Ref Range Status  08/07/2023 5.2 3.5 - 5.2 mmol/L Final  07/08/2013 3.5 3.5 - 5.1 mmol/L Final         Passed - Patient is not pregnant      Passed - Last BP in normal range    BP Readings from Last 1 Encounters:  08/07/23 122/70         Passed - Valid encounter within last 6 months    Recent Outpatient Visits           2 months ago Annual physical exam   [place] [facility] at [facility], Leita DEL, MD   4 months ago Bronchitis   [facility] at [facility], Leita DEL, MD   7 months ago  Essential hypertension   [facility] Primary Care & Sports Medicine at [facility], Leita DEL, MD       Future Appointments             In 1 month Justus, Leita DEL, MD [facility] Health Primary Care & Sports Medicine at [facility], [phone] [place]

## 2023-10-26 LAB — COLOGUARD: COLOGUARD: NEGATIVE

## 2023-11-02 ENCOUNTER — Other Ambulatory Visit: Payer: Self-pay | Admitting: Internal Medicine

## 2023-11-04 NOTE — Telephone Encounter (Signed)
 Requested Prescriptions  Refused Prescriptions Disp Refills   metoprolol  succinate (TOPROL -XL) 100 MG 24 hr tablet [Pharmacy Med Name: METOPROLOL  SUCC ER 100 MG TAB] 90 tablet 0    Sig: TAKE 0.5 TABLETS (50 MG TOTAL) BY MOUTH IN THE MORNING AND AT BEDTIME.     Cardiovascular:  Beta Blockers Passed - 11/04/2023  2:55 PM      Passed - Last BP in normal range    BP Readings from Last 1 Encounters:  08/07/23 122/70         Passed - Last Heart Rate in normal range    Pulse Readings from Last 1 Encounters:  08/07/23 83         Passed - Valid encounter within last 6 months    Recent Outpatient Visits           2 months ago Annual physical exam   [place] [facility] at [facility], Leita DEL, MD   5 months ago Bronchitis   [facility] Primary [facility] & Sports Medicine at [facility], Leita DEL, MD   7 months ago Essential hypertension   [facility] Primary Care & Sports Medicine at [facility], Leita DEL, MD       Future Appointments             In 1 month Justus, Leita DEL, MD [facility] Health Primary Care & Sports Medicine at [facility], [phone] [place]

## 2023-11-17 ENCOUNTER — Other Ambulatory Visit: Payer: Self-pay | Admitting: Internal Medicine

## 2023-11-17 DIAGNOSIS — F39 Unspecified mood [affective] disorder: Secondary | ICD-10-CM

## 2023-11-19 NOTE — Telephone Encounter (Signed)
 Requested Prescriptions  Refused Prescriptions Disp Refills   metoprolol  succinate (TOPROL -XL) 100 MG 24 hr tablet [Pharmacy Med Name: METOPROLOL  SUCC ER 100 MG TAB] 90 tablet 0    Sig: TAKE 0.5 TABLETS (50 MG TOTAL) BY MOUTH IN THE MORNING AND AT BEDTIME.     Cardiovascular:  Beta Blockers Passed - 11/19/2023 11:32 AM      Passed - Last BP in normal range    BP Readings from Last 1 Encounters:  08/07/23 122/70         Passed - Last Heart Rate in normal range    Pulse Readings from Last 1 Encounters:  08/07/23 83         Passed - Valid encounter within last 6 months    Recent Outpatient Visits           3 months ago Annual physical exam   [place] [facility] at [facility], Leita DEL, MD   5 months ago Bronchitis   [place] [facility] Medicine at [facility], Leita DEL, MD   7 months ago Essential hypertension   [place] [facility] Medicine at [facility], Leita DEL, MD       Future Appointments             In 3 weeks Tracy Leita DEL, MD [facility] Health Primary Care & Sports Medicine at [facility], [phone] [place]             DULoxetine  (CYMBALTA ) 60 MG capsule [Pharmacy Med Name: DULOXETINE  HCL DR 60 MG CAP] 90 capsule     Sig: TAKE 1 CAPSULE BY MOUTH EVERY DAY     Psychiatry: Antidepressants - SNRI - duloxetine  Passed - 11/19/2023 11:32 AM      Passed - Cr in normal range and within 360 days    Creatinine  Date Value Ref Range Status  07/08/2013 0.92 0.60 - 1.30 mg/dL Final   Creatinine, Ser  Date Value Ref Range Status  08/07/2023 0.98 0.57 - 1.00 mg/dL Final         Passed - eGFR is 30 or above and within 360 days    EGFR (African American)  Date Value Ref Range Status  07/08/2013 >60  Final   GFR calc Af Amer  Date Value Ref Range Status  07/21/2019 89 >59 mL/min/1.73 Final    Comment:    **[facility] currently reports eGFR in compliance with the current**    recommendations of the [employer]. [facility] will   update reporting as new guidelines are published from the NKF-ASN   Task force.    EGFR (Non-African Amer.)  Date Value Ref Range Status  07/08/2013 >60  Final    Comment:    eGFR values <40mL/min/1.73 m2 may be an indication of chronic kidney disease (CKD). Calculated eGFR is useful in patients with stable renal function. The eGFR calculation will not be reliable in acutely ill patients when serum creatinine is changing rapidly. It is not useful in  patients on dialysis. The eGFR calculation may not be applicable to patients at the low and high extremes of body sizes, pregnant women, and vegetarians.    GFR, Estimated  Date Value Ref Range Status  05/06/2021 51 (L) >60 mL/min Final    Comment:    (NOTE) Calculated using the CKD-EPI Creatinine Equation (2021)    eGFR  Date Value Ref Range Status  08/07/2023 64 >59 mL/min/1.73 Final  Passed - Completed PHQ-2 or PHQ-9 in the last 360 days      Passed - Last BP in normal range    BP Readings from Last 1 Encounters:  08/07/23 122/70         Passed - Valid encounter within last 6 months    Recent Outpatient Visits           3 months ago Annual physical exam   [facility] Sports Medicine at [facility], Leita DEL, MD   5 months ago Bronchitis   [facility] at [facility], Leita DEL, MD   7 months ago Essential hypertension   [facility] at [facility], Leita DEL, MD       Future Appointments             In 3 weeks Tracy Leita DEL, MD [facility] Primary Care & Sports Medicine at [facility], [phone] [place]

## 2023-11-22 ENCOUNTER — Other Ambulatory Visit: Payer: Self-pay | Admitting: Internal Medicine

## 2023-11-22 DIAGNOSIS — F5101 Primary insomnia: Secondary | ICD-10-CM

## 2023-11-24 ENCOUNTER — Other Ambulatory Visit: Payer: Self-pay | Admitting: Internal Medicine

## 2023-11-24 DIAGNOSIS — J4520 Mild intermittent asthma, uncomplicated: Secondary | ICD-10-CM

## 2023-11-25 NOTE — Telephone Encounter (Signed)
 Requested Prescriptions  Pending Prescriptions Disp Refills   traZODone  (DESYREL ) 50 MG tablet [Pharmacy Med Name: TRAZODONE  50 MG TABLET] 90 tablet 0    Sig: TAKE 0.5-1 TABLETS BY MOUTH AT BEDTIME AS NEEDED FOR SLEEP.     Psychiatry: Antidepressants - Serotonin Modulator Passed - 11/25/2023  3:13 PM      Passed - Completed PHQ-2 or PHQ-9 in the last 360 days      Passed - Valid encounter within last 6 months    Recent Outpatient Visits           3 months ago Annual physical exam   [facility] Health Primary Care & Sports Medicine at [facility], Leita DEL, MD   5 months ago Bronchitis   [facility] Primary [facility] Sports Medicine at [facility], Leita DEL, MD   8 months ago Essential hypertension   [facility] Health Primary Care & Sports Medicine at [facility], Leita DEL, MD       Future Appointments             In 2 weeks Justus Leita DEL, MD [facility] Health Primary Care & Sports Medicine at [facility], [phone] [place]

## 2023-11-26 NOTE — Telephone Encounter (Signed)
 Requested Prescriptions  Pending Prescriptions Disp Refills   WIXELA INHUB 100-50 MCG/ACT AEPB [Pharmacy Med Name: WIXELA 100-50 INHUB] 60 each 0    Sig: INHALE 1 PUFF INTO THE LUNGS TWICE A DAY     Pulmonology:  Combination Products Passed - 11/26/2023  2:20 PM      Passed - Valid encounter within last 12 months    Recent Outpatient Visits           3 months ago Annual physical exam   [place] [facility] Care & Sports Medicine at [facility], Leita DEL, MD   5 months ago Bronchitis   [facility] Primary Care & Sports Medicine at [facility], Leita DEL, MD   8 months ago Essential hypertension   [facility] Primary Care & Sports Medicine at [facility], Leita DEL, MD       Future Appointments             In 2 weeks Justus, Leita DEL, MD [facility] Health Primary Care & Sports Medicine at [facility], [phone] [place]

## 2023-12-10 ENCOUNTER — Encounter: Payer: Self-pay | Admitting: Internal Medicine

## 2023-12-10 ENCOUNTER — Ambulatory Visit (INDEPENDENT_AMBULATORY_CARE_PROVIDER_SITE_OTHER): Payer: PRIVATE HEALTH INSURANCE | Admitting: Internal Medicine

## 2023-12-10 VITALS — BP 108/64 | HR 67 | Ht 61.0 in | Wt 126.0 lb

## 2023-12-10 DIAGNOSIS — Z1231 Encounter for screening mammogram for malignant neoplasm of breast: Secondary | ICD-10-CM | POA: Diagnosis not present

## 2023-12-10 DIAGNOSIS — Z23 Encounter for immunization: Secondary | ICD-10-CM | POA: Diagnosis not present

## 2023-12-10 DIAGNOSIS — F324 Major depressive disorder, single episode, in partial remission: Secondary | ICD-10-CM

## 2023-12-10 DIAGNOSIS — E118 Type 2 diabetes mellitus with unspecified complications: Secondary | ICD-10-CM | POA: Diagnosis not present

## 2023-12-10 DIAGNOSIS — Z7985 Long-term (current) use of injectable non-insulin antidiabetic drugs: Secondary | ICD-10-CM

## 2023-12-10 NOTE — Assessment & Plan Note (Addendum)
 Currently medications are Trulicity  and MTF.  No hypoglycemic episodes noted. Last visit medical regimen changes were none. Lab Results  Component Value Date   HGBA1C 7.9 (H) 08/07/2023  A1C today =  7.5.  will continue the same medication doses.

## 2023-12-10 NOTE — Patient Instructions (Addendum)
 Call [facility] to schedule your mammogram at [phone].   We hope you enjoyed your recent visit with our office! Your feedback means so much to our team, and it helps us  to continue providing the best care possible. If you had a positive experience, we'd love if you could share it by leaving us  a [employer] Review and also completing our patient survey that you'll receive soon.  Your kind words not only brighten our day but also help other patients feel confident in choosing our office for their care.  Thank you for being a part of our practice family!

## 2023-12-10 NOTE — Assessment & Plan Note (Addendum)
 Depression and anxiety symptoms are moderate but stable on cymbalta  and buspar . Trazodone  added last visit to aid sleep.  She is not taking this regularly but is advised she can take it PRN. No SI/HI reported. I recommend continuing the same medical regimen.

## 2023-12-10 NOTE — Progress Notes (Signed)
 Date:  12/10/2023   Name:  Tracy Bryan   DOB:  October 10, 1960   MRN:  [id number]   Chief Complaint: Diabetes and Hypertension  Diabetes She presents for her follow-up diabetic visit. She has type 2 diabetes mellitus. Her disease course has been stable. Pertinent negatives for hypoglycemia include no headaches, nervousness/anxiousness or tremors. Pertinent negatives for diabetes include no chest pain, no fatigue, no polydipsia and no polyuria. Current diabetic treatments: Trulicity  and MTF.  Depression        This is a chronic problem.  The problem has been gradually improving since onset.  Associated symptoms include no fatigue, no appetite change and no headaches.  Past treatments include SNRIs - Serotonin and norepinephrine reuptake inhibitors and other medications.  Compliance with treatment is good.  Previous treatment provided moderate relief.   Review of Systems  Constitutional:  Negative for appetite change, fatigue, fever and unexpected weight change.  HENT:  Negative for tinnitus and trouble swallowing.   Eyes:  Negative for visual disturbance.  Respiratory:  Negative for cough, chest tightness and shortness of breath.   Cardiovascular:  Negative for chest pain, palpitations and leg swelling.  Gastrointestinal:  Negative for abdominal pain.  Endocrine: Negative for polydipsia and polyuria.  Genitourinary:  Negative for dysuria and hematuria.  Musculoskeletal:  Negative for arthralgias.  Neurological:  Negative for tremors, numbness and headaches.  Psychiatric/Behavioral:  Positive for depression and sleep disturbance. Negative for dysphoric mood. The patient is not nervous/anxious.      Lab Results  Component Value Date   NA 136 08/07/2023   K 5.2 08/07/2023   CO2 21 08/07/2023   GLUCOSE 167 (H) 08/07/2023   BUN 19 08/07/2023   CREATININE 0.98 08/07/2023   CALCIUM  10.1 08/07/2023   EGFR 64 08/07/2023   GFRNONAA 51 (L) 05/06/2021   Lab Results  Component Value  Date   CHOL 192 08/07/2023   HDL 51 08/07/2023   LDLCALC 72 08/07/2023   TRIG 441 (H) 08/07/2023   CHOLHDL 3.8 08/07/2023   Lab Results  Component Value Date   TSH 1.690 08/07/2023   Lab Results  Component Value Date   HGBA1C 7.9 (H) 08/07/2023   Lab Results  Component Value Date   WBC 8.4 08/07/2023   HGB 13.5 08/07/2023   HCT 42.0 08/07/2023   MCV 90 08/07/2023   PLT 321 08/07/2023   Lab Results  Component Value Date   ALT 29 08/07/2023   AST 31 08/07/2023   ALKPHOS 94 08/07/2023   BILITOT 0.4 08/07/2023   Lab Results  Component Value Date   VD25OH 52.8 05/28/2017     Patient Active Problem List   Diagnosis Date Noted   Primary insomnia 08/07/2023   Reactive airway disease 03/27/2023   Shoulder tendonitis, right 03/27/2023   Chronic pain of both knees 11/20/2022   Stage 3a chronic kidney disease (HCC) 08/01/2022   Major depressive disorder with single episode, 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   Muscle tension headache 03/30/2020   Status post total hysterectomy 03/23/2020   Bilateral carpal tunnel syndrome 11/07/2016   Herpes simplex infection 11/23/2015   Fibromyalgia 03/11/2015   Menopause syndrome 02/17/2015   Esophageal reflux 02/17/2015   Vitamin D  deficiency 02/17/2015   Hyperlipidemia associated with type 2 diabetes mellitus (HCC) 07/28/2014   Essential hypertension 07/28/2014   Type II diabetes mellitus with complication (HCC) 05/18/2014    Allergies  Allergen Reactions   Rabeprazole  Sodium     C Diff Side Affects  Augmentin  [Amoxicillin -Pot Clavulanate] Itching   Benazepril Hcl Cough   Penicillins Other (See Comments)   Jardiance  [Empagliflozin ] Rash    Recurrent yeast vaginitis    Past Surgical History:  Procedure Laterality Date   CERVICAL DISC SURGERY     COLONOSCOPY  07/22/2013   DILATION AND CURETTAGE OF UTERUS  1996   HERNIA REPAIR  2000   Umblical   HIP ARTHROPLASTY Right    TOTAL ABDOMINAL HYSTERECTOMY     TOTAL HIP  ARTHROPLASTY Left 05/18/2014   Procedure: LEFT TOTAL HIP ARTHROPLASTY ANTERIOR APPROACH;  Surgeon: Maude Herald, MD;  Location: [facility] OR;  Service: Orthopedics;  Laterality: Left;    Social History   Tobacco Use   Smoking status: Former    Current packs/day: 0.00    Types: Cigarettes    Quit date: 06/14/1984    Years since quitting: 39.5   Smokeless tobacco: Never  Vaping Use   Vaping status: Never Used  Substance Use Topics   Alcohol use: Not Currently   Drug use: Never     Medication list has been reviewed and updated.  Current Meds  Medication Sig   albuterol  (VENTOLIN  HFA) 108 (90 Base) MCG/ACT inhaler TAKE 2 PUFFS BY MOUTH EVERY 6 HOURS AS NEEDED FOR WHEEZE OR SHORTNESS OF BREATH   amLODipine  (NORVASC ) 5 MG tablet TAKE 1 TABLET (5 MG TOTAL) BY MOUTH DAILY.   aspirin  81 MG tablet Take 81 mg by mouth daily.   atorvastatin  (LIPITOR) 40 MG tablet TAKE 1 TABLET BY MOUTH EVERY DAY   busPIRone  (BUSPAR ) 10 MG tablet TAKE 1 TABLET BY MOUTH THREE TIMES A DAY   Cholecalciferol (VITAMIN D ) 2000 units CAPS Take 1 capsule (2,000 Units total) by mouth daily.   Dulaglutide  (TRULICITY ) 1.5 MG/0.5ML SOAJ Inject 1.5 mg into the skin once a week.   DULoxetine  (CYMBALTA ) 60 MG capsule TAKE 1 CAPSULE BY MOUTH TWICE A DAY   losartan  (COZAAR ) 100 MG tablet TAKE 1 TABLET BY MOUTH EVERY DAY   meloxicam  (MOBIC ) 15 MG tablet TAKE 1 TABLET (15 MG TOTAL) BY MOUTH DAILY.   metFORMIN  (GLUCOPHAGE -XR) 500 MG 24 hr tablet TAKE 1 TABLET BY MOUTH EVERY DAY WITH BREAKFAST   methocarbamol  (ROBAXIN ) 500 MG tablet TAKE 1 TABLET BY MOUTH AT BEDTIME.   metoprolol  succinate (TOPROL -XL) 100 MG 24 hr tablet TAKE 0.5 TABLETS (50 MG TOTAL) BY MOUTH IN THE MORNING AND AT BEDTIME.   Multiple Vitamins-Minerals (MULTIVITAMIN WITH MINERALS) tablet Take 1 tablet by mouth daily.   omeprazole  (PRILOSEC) 20 MG capsule TAKE 1 CAPSULE (20 MG TOTAL) BY MOUTH 2 (TWO) TIMES DAILY BEFORE A MEAL.   traZODone  (DESYREL ) 50 MG tablet TAKE  0.5-1 TABLETS BY MOUTH AT BEDTIME AS NEEDED FOR SLEEP.   valACYclovir  (VALTREX ) 1000 MG tablet TAKE 1 TABLET BY MOUTH TWICE A DAY   WIXELA INHUB 100-50 MCG/ACT AEPB INHALE 1 PUFF INTO THE LUNGS TWICE A DAY       12/10/2023    1:11 PM 08/07/2023    8:10 AM 06/03/2023   11:22 AM 03/27/2023   11:34 AM  GAD 7 : Generalized Anxiety Score  Nervous, Anxious, on Edge 0 2 2 2   Control/stop worrying 0 2 2 2   Worry too much - different things 0 2 2 2   Trouble relaxing 0 2 2 1   Restless 0 2 2 1   Easily annoyed or irritable 0 2 2 1   Afraid - awful might happen 0 1 0 0  Total GAD 7 Score 0 13  12 9  Anxiety Difficulty Not difficult at all Very difficult Somewhat difficult Not difficult at all       12/10/2023    1:11 PM 08/07/2023    8:10 AM 06/03/2023   11:22 AM  Depression screen PHQ 2/9  Decreased Interest 0 1 2  Down, Depressed, Hopeless 0 0 1  PHQ - 2 Score 0 1 3  Altered sleeping 0 2 2  Tired, decreased energy 0 2 2  Change in appetite 0 2 1  Feeling bad or failure about yourself  0 0 0  Trouble concentrating 0 1 2  Moving slowly or fidgety/restless 0 0 0  Suicidal thoughts 0 0 0  PHQ-9 Score 0 8 10  Difficult doing work/chores Not difficult at all Not difficult at all     BP Readings from Last 3 Encounters:  12/10/23 108/64  08/07/23 122/70  06/03/23 120/70    Physical Exam Vitals and nursing note reviewed.  Constitutional:      General: She is not in acute distress.    Appearance: She is well-developed.  HENT:     Head: Normocephalic and atraumatic.  Neck:     Vascular: No carotid bruit.  Cardiovascular:     Rate and Rhythm: Normal rate and regular rhythm.  Pulmonary:     Effort: Pulmonary effort is normal. No respiratory distress.     Breath sounds: No wheezing or rhonchi.  Musculoskeletal:     Cervical back: Normal range of motion.     Right lower leg: No edema.     Left lower leg: No edema.  Lymphadenopathy:     Cervical: No cervical adenopathy.  Skin:     General: Skin is warm and dry.     Findings: No rash.  Neurological:     Mental Status: She is alert and oriented to person, place, and time.  Psychiatric:        Mood and Affect: Mood normal.        Behavior: Behavior normal.     Wt Readings from Last 3 Encounters:  12/10/23 126 lb (57.2 kg)  08/07/23 127 lb (57.6 kg)  06/03/23 124 lb 2 oz (56.3 kg)    BP 108/64   Pulse 67   Ht 5' 1 (1.549 m)   Wt 126 lb (57.2 kg)   LMP  (LMP Unknown)   SpO2 99%   BMI 23.81 kg/m   Assessment and Plan:  Problem List Items Addressed This Visit       Unprioritized   Type II diabetes mellitus with complication (HCC) - Primary (Chronic)   Currently medications are Trulicity  and MTF.  No hypoglycemic episodes noted. Last visit medical regimen changes were none. Lab Results  Component Value Date   HGBA1C 7.9 (H) 08/07/2023  A1C today =  7.5.  will continue the same medication doses.       Major depressive disorder with single episode, in partial remission (Chronic)   Depression and anxiety symptoms are moderate but stable on cymbalta  and buspar . Trazodone  added last visit to aid sleep.  She is not taking this regularly but is advised she can take it PRN. No SI/HI reported. I recommend continuing the same medical regimen.       Other Visit Diagnoses       Long-term current use of injectable noninsulin antidiabetic medication         Encounter for screening mammogram for breast cancer       Relevant Orders   MM 3D  SCREENING MAMMOGRAM BILATERAL BREAST     Encounter for immunization       Relevant Orders   Flu vaccine trivalent PF, 6mos and older(Flulaval,Afluria,Fluarix,Fluzone) (Completed)       Return in about 4 months (around 04/11/2024) for DM  Dr. Lemon.    Leita HILARIO Adie, MD [facility] [place]

## 2024-01-31 ENCOUNTER — Ambulatory Visit: Payer: PRIVATE HEALTH INSURANCE | Admitting: Internal Medicine

## 2024-02-17 ENCOUNTER — Other Ambulatory Visit: Payer: Self-pay | Admitting: Internal Medicine

## 2024-02-17 DIAGNOSIS — F5101 Primary insomnia: Secondary | ICD-10-CM

## 2024-02-19 NOTE — Telephone Encounter (Signed)
 Requested Prescriptions  Pending Prescriptions Disp Refills   traZODone  (DESYREL ) 50 MG tablet [Pharmacy Med Name: TRAZODONE  50 MG TABLET] 90 tablet 0    Sig: TAKE 1/2 TO 1 TABLET BY MOUTH AT BEDTIME AS NEEDED FOR SLEEP     Psychiatry: Antidepressants - Serotonin Modulator Passed - 02/19/2024  8:24 AM      Passed - Completed PHQ-2 or PHQ-9 in the last 360 days      Passed - Valid encounter within last 6 months    Recent Outpatient Visits           2 months ago Type II diabetes mellitus with complication [facility])    Primary Care & Sports Medicine at [facility], Leita DEL, MD   6 months ago Annual physical exam   [facility] at [facility], Leita DEL, MD   8 months ago Bronchitis   [facility] at [facility], Leita DEL, MD   10 months ago Essential hypertension   [facility] at [facility], Leita DEL, MD

## 2024-02-27 ENCOUNTER — Other Ambulatory Visit (HOSPITAL_COMMUNITY): Payer: Self-pay

## 2024-02-27 ENCOUNTER — Telehealth: Payer: Self-pay

## 2024-02-27 ENCOUNTER — Telehealth: Payer: Self-pay | Admitting: Pharmacy Technician

## 2024-02-27 NOTE — Telephone Encounter (Signed)
 Pharmacy Patient Advocate Encounter   Received notification from Pt Calls Messages that prior authorization for Trulicity  1.5MG /0.5ML auto-injectors  is required/requested.   Insurance verification completed.   The patient is insured through [facility].   Per test claim: PA required; PA started via CoverMyMeds. KEY [id number] . Waiting for clinical questions to populate.

## 2024-02-27 NOTE — Telephone Encounter (Signed)
 Copied from CRM #[id number]. Topic: Clinical - Medication Prior Auth >> Feb 27, 2024  8:54 AM Willma SAUNDERS wrote: Reason for CRM: Patient states the pharmacy advised insurance is requiring prior authorization for Dulaglutide  (TRULICITY ) 1.5 MG/0.5ML SOAJ.  Patients husband can be reached at [phone]

## 2024-02-27 NOTE — Telephone Encounter (Signed)
 PA request has been Started. New Encounter has been or will be created for follow up. For additional info see Pharmacy Prior Auth telephone encounter from 02/27/24.

## 2024-02-28 ENCOUNTER — Other Ambulatory Visit (HOSPITAL_COMMUNITY): Payer: Self-pay

## 2024-02-28 NOTE — Telephone Encounter (Signed)
 Pharmacy Patient Advocate Encounter  Received notification from [facility] that Prior Authorization for Trulicity  1.5MG /0.5ML auto-injectors has been APPROVED from 02/28/24 to 02/28/27   PA #/Case ID/Reference #: [id number]

## 2024-03-02 ENCOUNTER — Telehealth: Payer: Self-pay

## 2024-03-02 NOTE — Telephone Encounter (Signed)
 Pt called. Pt aware.  KP

## 2024-03-02 NOTE — Telephone Encounter (Signed)
 Received notification from [facility] that Prior Authorization for Trulicity  1.5MG /0.5ML auto-injectors has been APPROVED from 02/28/24 to 02/28/27   KP

## 2024-03-02 NOTE — Telephone Encounter (Signed)
 Patient will need prior authorization for her Trulicity . [facility] [place].

## 2024-03-04 ENCOUNTER — Other Ambulatory Visit (HOSPITAL_COMMUNITY): Payer: Self-pay

## 2024-03-18 ENCOUNTER — Other Ambulatory Visit: Payer: Self-pay

## 2024-03-18 DIAGNOSIS — F39 Unspecified mood [affective] disorder: Secondary | ICD-10-CM

## 2024-03-18 MED ORDER — DULOXETINE HCL 60 MG PO CPEP: 60.0000 mg | ORAL_CAPSULE | Freq: Two times a day (BID) | ORAL | 0 refills | Status: AC

## 2024-04-10 ENCOUNTER — Ambulatory Visit: Payer: PRIVATE HEALTH INSURANCE | Admitting: Student
